# Patient Record
Sex: Female | Born: 1935
Health system: Southern US, Community
[De-identification: ages and names within clinical notes are randomized; demographics above are authoritative.]

## PROBLEM LIST (undated history)

## (undated) DIAGNOSIS — J449 Chronic obstructive pulmonary disease, unspecified: Secondary | ICD-10-CM

## (undated) DIAGNOSIS — N183 Chronic kidney disease, stage 3 unspecified: Secondary | ICD-10-CM

## (undated) DIAGNOSIS — I499 Cardiac arrhythmia, unspecified: Secondary | ICD-10-CM

## (undated) DIAGNOSIS — M4800 Spinal stenosis, site unspecified: Secondary | ICD-10-CM

## (undated) DIAGNOSIS — C439 Malignant melanoma of skin, unspecified: Secondary | ICD-10-CM

## (undated) DIAGNOSIS — I739 Peripheral vascular disease, unspecified: Secondary | ICD-10-CM

## (undated) DIAGNOSIS — I2510 Atherosclerotic heart disease of native coronary artery without angina pectoris: Secondary | ICD-10-CM

## (undated) DIAGNOSIS — E785 Hyperlipidemia, unspecified: Secondary | ICD-10-CM

## (undated) DIAGNOSIS — I10 Essential (primary) hypertension: Secondary | ICD-10-CM

## (undated) DIAGNOSIS — K5790 Diverticulosis of intestine, part unspecified, without perforation or abscess without bleeding: Secondary | ICD-10-CM

## (undated) DIAGNOSIS — I5032 Chronic diastolic (congestive) heart failure: Secondary | ICD-10-CM

## (undated) HISTORY — DX: Malignant melanoma of skin, unspecified: C43.9

## (undated) HISTORY — PX: HIP FRACTURE SURGERY: SHX118

## (undated) HISTORY — PX: OTHER SURGICAL HISTORY: SHX169

## (undated) HISTORY — DX: Spinal stenosis, site unspecified: M48.00

## (undated) HISTORY — DX: Peripheral vascular disease, unspecified: I73.9

## (undated) HISTORY — PX: CARDIAC CATHETERIZATION: SHX172

## (undated) HISTORY — PX: LUMBAR DISC SURGERY: SHX700

## (undated) HISTORY — PX: CATARACT EXTRACTION: SUR2

## (undated) HISTORY — DX: Hyperlipidemia, unspecified: E78.5

## (undated) HISTORY — DX: Essential (primary) hypertension: I10

## (undated) HISTORY — DX: Diverticulosis of intestine, part unspecified, without perforation or abscess without bleeding: K57.90

## (undated) HISTORY — DX: Cardiac arrhythmia, unspecified: I49.9

## (undated) HISTORY — DX: Atherosclerotic heart disease of native coronary artery without angina pectoris: I25.10

## (undated) HISTORY — DX: Chronic obstructive pulmonary disease, unspecified: J44.9

## (undated) HISTORY — DX: Chronic diastolic (congestive) heart failure: I50.32

## (undated) SURGERY — Surgical Case
Anesthesia: *Unknown

---

## 2005-02-22 ENCOUNTER — Ambulatory Visit: Payer: Self-pay

## 2005-05-24 ENCOUNTER — Encounter: Admission: RE | Admit: 2005-05-24 | Discharge: 2005-05-24 | Payer: Self-pay | Admitting: Cardiovascular Disease

## 2005-05-30 ENCOUNTER — Ambulatory Visit (HOSPITAL_COMMUNITY): Admission: RE | Admit: 2005-05-30 | Discharge: 2005-05-30 | Payer: Self-pay | Admitting: Cardiovascular Disease

## 2005-05-30 ENCOUNTER — Encounter: Payer: Self-pay | Admitting: Cardiovascular Disease

## 2005-12-18 ENCOUNTER — Other Ambulatory Visit: Payer: Self-pay

## 2005-12-19 ENCOUNTER — Inpatient Hospital Stay: Payer: Self-pay | Admitting: Internal Medicine

## 2006-08-12 ENCOUNTER — Ambulatory Visit: Payer: Self-pay | Admitting: Gastroenterology

## 2006-12-16 DIAGNOSIS — I2510 Atherosclerotic heart disease of native coronary artery without angina pectoris: Secondary | ICD-10-CM

## 2006-12-16 HISTORY — PX: CORONARY ARTERY BYPASS GRAFT: SHX141

## 2006-12-16 HISTORY — DX: Atherosclerotic heart disease of native coronary artery without angina pectoris: I25.10

## 2007-01-12 ENCOUNTER — Emergency Department: Payer: Self-pay | Admitting: Internal Medicine

## 2007-07-10 ENCOUNTER — Ambulatory Visit: Payer: Self-pay | Admitting: Family Medicine

## 2007-07-14 ENCOUNTER — Ambulatory Visit: Payer: Self-pay

## 2007-08-24 ENCOUNTER — Inpatient Hospital Stay: Payer: Self-pay | Admitting: Cardiovascular Disease

## 2007-08-25 ENCOUNTER — Encounter: Payer: Self-pay | Admitting: Cardiovascular Disease

## 2007-08-25 ENCOUNTER — Inpatient Hospital Stay (HOSPITAL_COMMUNITY): Admission: AD | Admit: 2007-08-25 | Discharge: 2007-09-04 | Payer: Self-pay | Admitting: Cardiovascular Disease

## 2007-08-26 ENCOUNTER — Encounter: Payer: Self-pay | Admitting: Surgery

## 2007-08-26 ENCOUNTER — Ambulatory Visit: Payer: Self-pay | Admitting: Thoracic Surgery (Cardiothoracic Vascular Surgery)

## 2007-09-21 ENCOUNTER — Encounter
Admission: RE | Admit: 2007-09-21 | Discharge: 2007-09-21 | Payer: Self-pay | Admitting: Thoracic Surgery (Cardiothoracic Vascular Surgery)

## 2007-10-05 ENCOUNTER — Encounter
Admission: RE | Admit: 2007-10-05 | Discharge: 2007-10-05 | Payer: Self-pay | Admitting: Thoracic Surgery (Cardiothoracic Vascular Surgery)

## 2007-10-05 ENCOUNTER — Ambulatory Visit: Payer: Self-pay | Admitting: Thoracic Surgery (Cardiothoracic Vascular Surgery)

## 2007-10-21 ENCOUNTER — Encounter: Payer: Self-pay | Admitting: Cardiovascular Disease

## 2007-10-23 ENCOUNTER — Encounter: Admission: RE | Admit: 2007-10-23 | Discharge: 2007-10-23 | Payer: Self-pay | Admitting: Cardiothoracic Surgery

## 2007-10-23 ENCOUNTER — Ambulatory Visit: Payer: Self-pay | Admitting: Cardiothoracic Surgery

## 2007-10-30 ENCOUNTER — Ambulatory Visit: Payer: Self-pay | Admitting: Cardiothoracic Surgery

## 2007-11-16 ENCOUNTER — Encounter: Payer: Self-pay | Admitting: Cardiovascular Disease

## 2007-12-17 ENCOUNTER — Encounter: Payer: Self-pay | Admitting: Cardiovascular Disease

## 2008-01-17 ENCOUNTER — Encounter: Payer: Self-pay | Admitting: Cardiovascular Disease

## 2008-09-23 ENCOUNTER — Encounter: Payer: Self-pay | Admitting: Cardiovascular Disease

## 2008-11-04 IMAGING — CR DG CHEST 2V
1 series · 2 of 2 positions shown · non-contrast
Comparison: none

REASON FOR EXAM: Chest pain
COMMENTS:

[Series 1: view not recorded · 0.17mm/px · 2 of 2 slices shown]
[im 1/2]
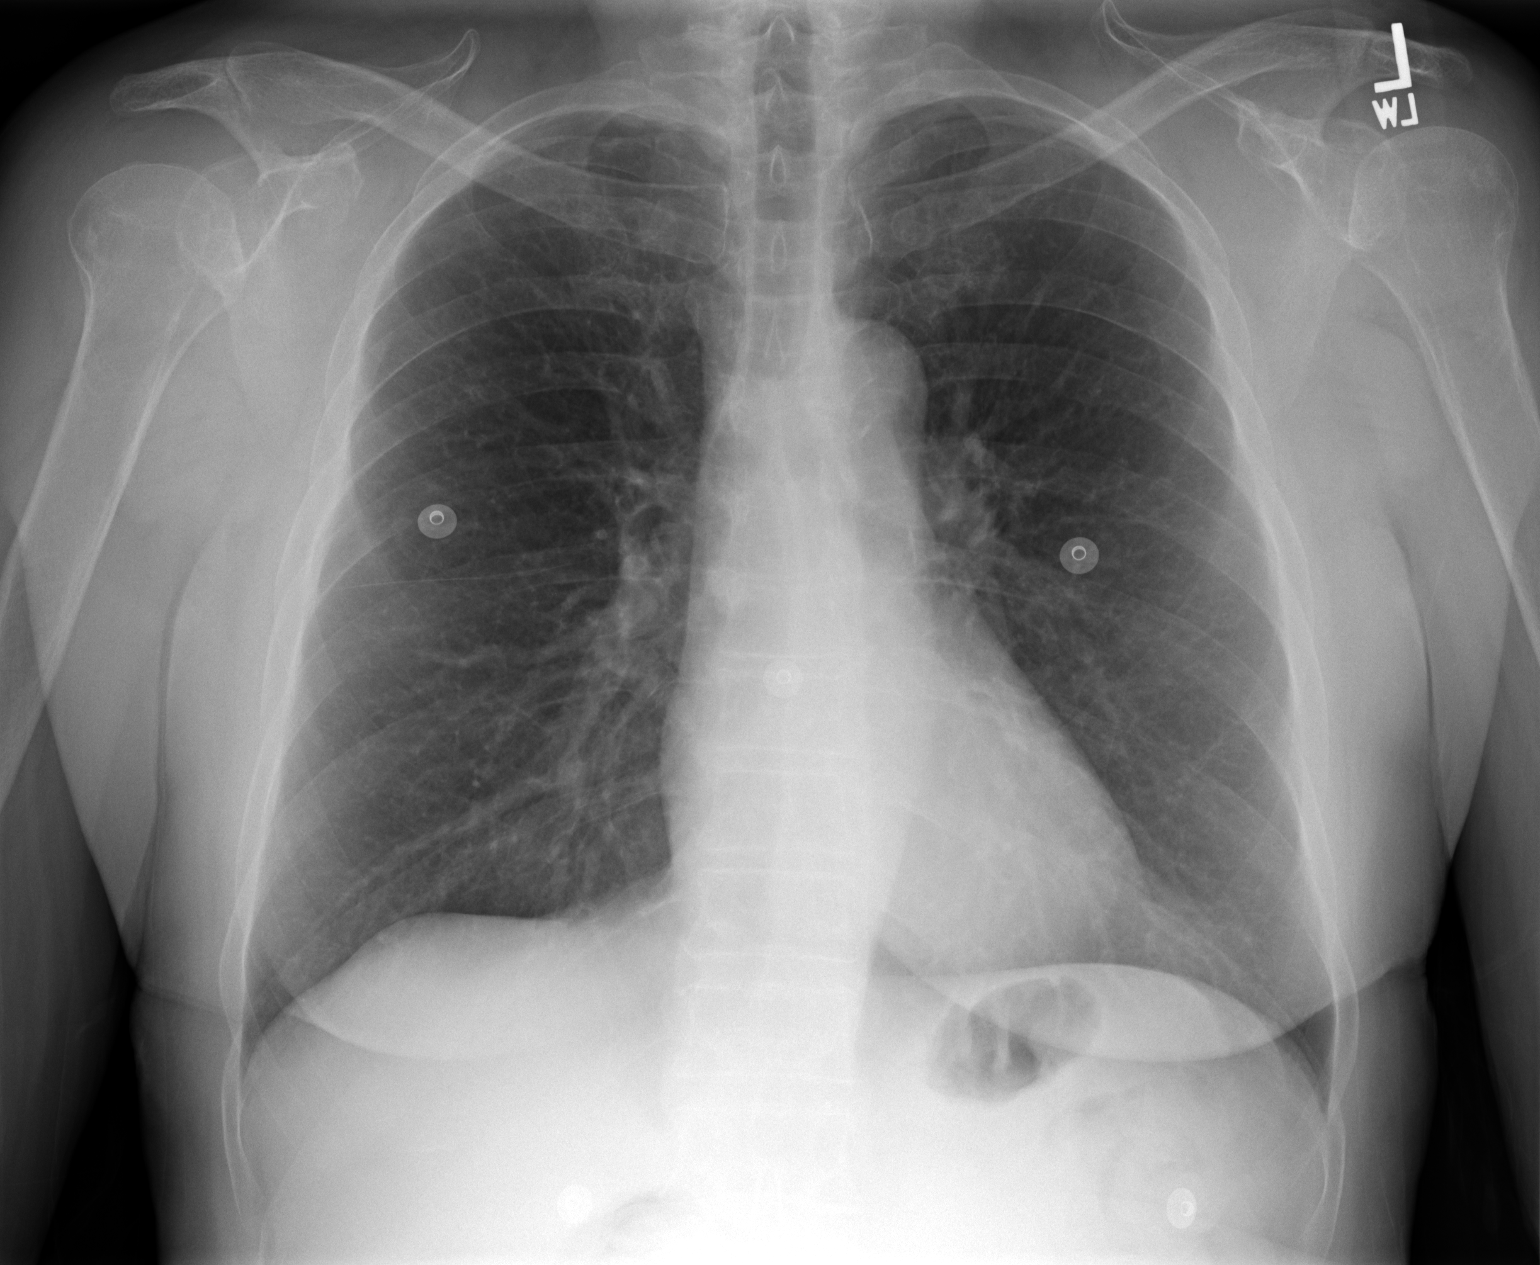
[im 2/2]
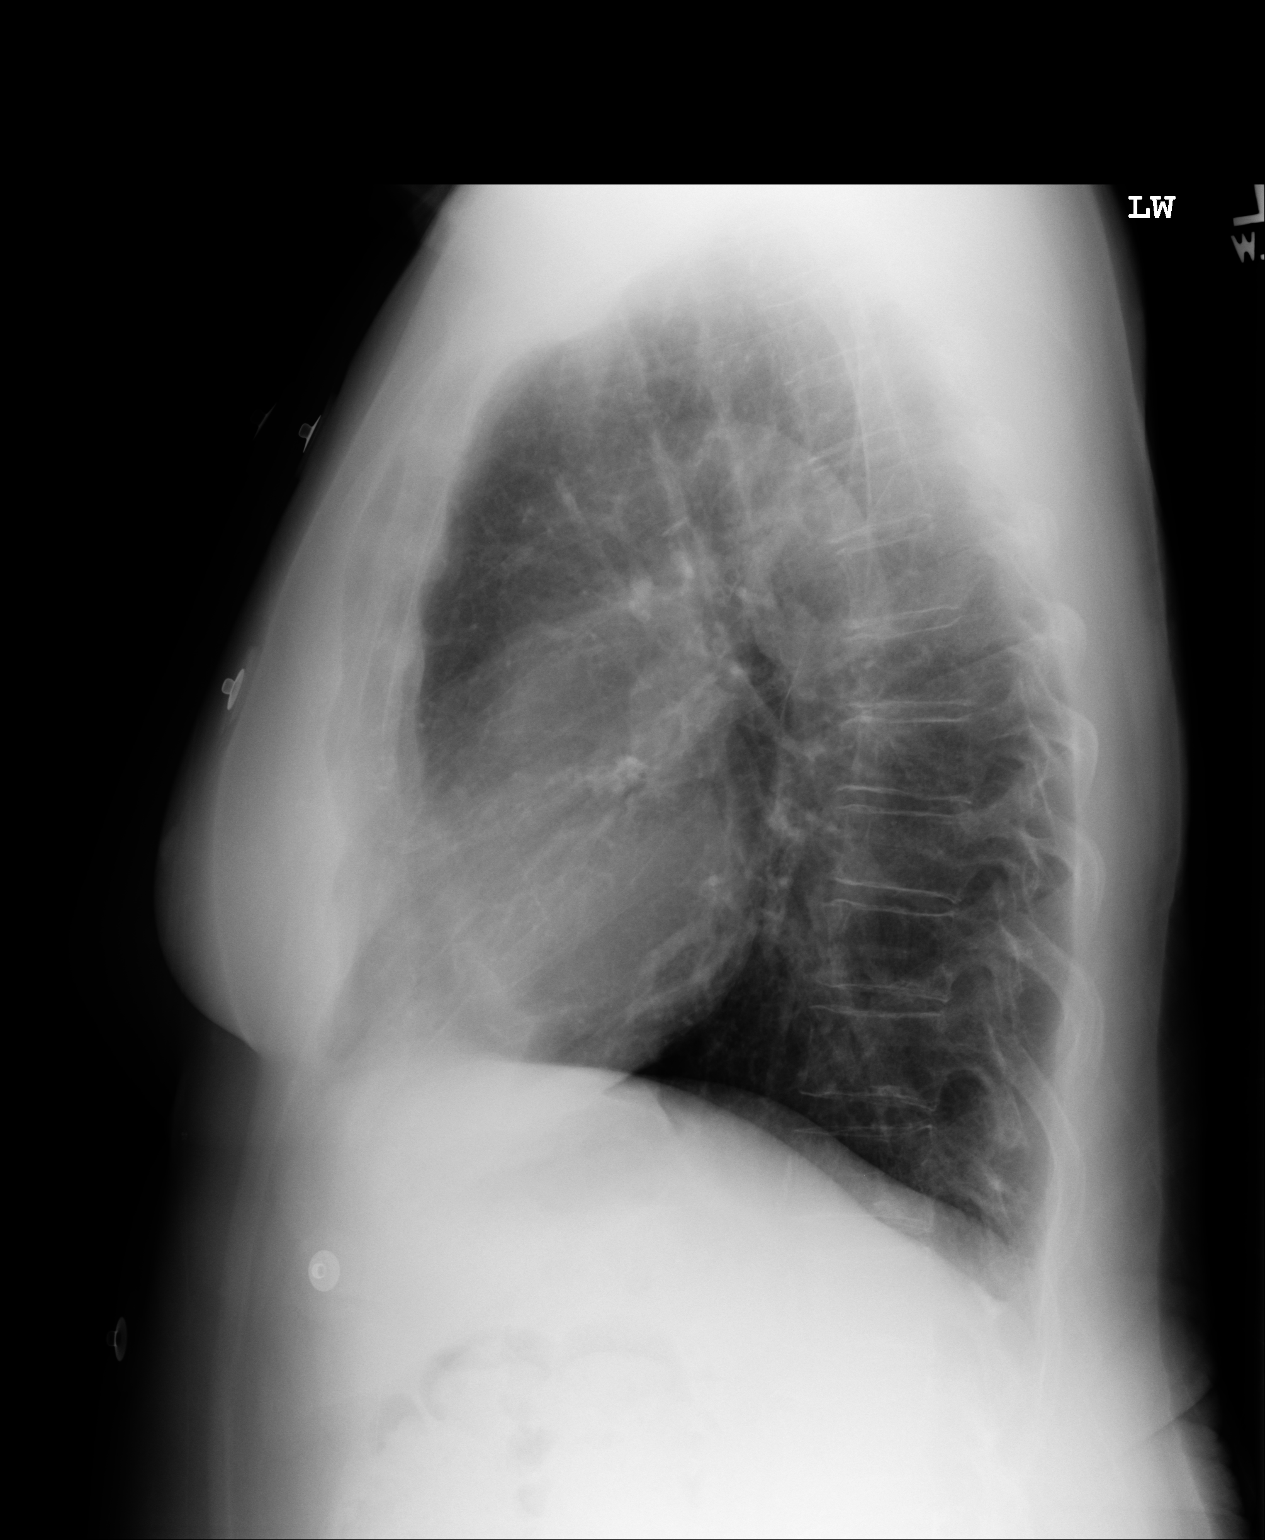

[2 of 2 positions shown; findings below may reference images not displayed]

PROCEDURE:     DXR - DXR CHEST PA (OR AP) AND LATERAL  - August 24, 2007  [DATE]

RESULT:     The lungs are mildly hyperinflated. There is no focal
infiltrate. The heart is normal in size and the pulmonary vascularity is not
engorged. The bones are mildly osteopenic. When compared to the study of
01/12/2007, there has not been significant interval change.
IMPRESSION: I do not see evidence of acute cardiopulmonary abnormality.
There are findings consistent with COPD.

## 2009-03-02 ENCOUNTER — Encounter: Payer: Self-pay | Admitting: Cardiovascular Disease

## 2009-05-08 ENCOUNTER — Encounter: Payer: Self-pay | Admitting: Cardiovascular Disease

## 2009-06-08 ENCOUNTER — Encounter: Payer: Self-pay | Admitting: Cardiovascular Disease

## 2009-06-14 ENCOUNTER — Encounter: Payer: Self-pay | Admitting: Cardiovascular Disease

## 2010-02-23 ENCOUNTER — Telehealth: Payer: Self-pay | Admitting: Cardiovascular Disease

## 2010-03-14 ENCOUNTER — Ambulatory Visit: Payer: Self-pay | Admitting: Cardiovascular Disease

## 2010-03-14 DIAGNOSIS — I25810 Atherosclerosis of coronary artery bypass graft(s) without angina pectoris: Secondary | ICD-10-CM | POA: Insufficient documentation

## 2010-03-14 DIAGNOSIS — E1165 Type 2 diabetes mellitus with hyperglycemia: Secondary | ICD-10-CM

## 2010-03-14 DIAGNOSIS — I739 Peripheral vascular disease, unspecified: Secondary | ICD-10-CM

## 2010-03-14 DIAGNOSIS — E785 Hyperlipidemia, unspecified: Secondary | ICD-10-CM | POA: Insufficient documentation

## 2010-03-14 DIAGNOSIS — I10 Essential (primary) hypertension: Secondary | ICD-10-CM

## 2010-03-14 DIAGNOSIS — R079 Chest pain, unspecified: Secondary | ICD-10-CM

## 2010-03-14 DIAGNOSIS — E118 Type 2 diabetes mellitus with unspecified complications: Secondary | ICD-10-CM

## 2010-03-27 ENCOUNTER — Telehealth: Payer: Self-pay | Admitting: Cardiovascular Disease

## 2010-04-03 ENCOUNTER — Telehealth: Payer: Self-pay | Admitting: Cardiovascular Disease

## 2010-04-20 ENCOUNTER — Telehealth: Payer: Self-pay | Admitting: Cardiovascular Disease

## 2010-06-06 ENCOUNTER — Telehealth: Payer: Self-pay | Admitting: Cardiovascular Disease

## 2010-06-07 ENCOUNTER — Encounter: Payer: Self-pay | Admitting: Cardiovascular Disease

## 2010-06-10 ENCOUNTER — Emergency Department: Payer: PRIVATE HEALTH INSURANCE | Admitting: Emergency Medicine

## 2010-06-12 ENCOUNTER — Telehealth: Payer: Self-pay | Admitting: Cardiovascular Disease

## 2010-06-19 ENCOUNTER — Encounter: Payer: Self-pay | Admitting: Cardiovascular Disease

## 2010-06-25 ENCOUNTER — Ambulatory Visit: Payer: Self-pay | Admitting: Cardiovascular Disease

## 2010-07-02 ENCOUNTER — Inpatient Hospital Stay: Payer: PRIVATE HEALTH INSURANCE | Admitting: *Deleted

## 2010-07-02 ENCOUNTER — Ambulatory Visit: Payer: Self-pay | Admitting: Cardiovascular Disease

## 2010-10-02 ENCOUNTER — Ambulatory Visit: Payer: Self-pay

## 2010-10-02 ENCOUNTER — Encounter: Payer: Self-pay | Admitting: Cardiovascular Disease

## 2010-11-06 ENCOUNTER — Telehealth: Payer: Self-pay | Admitting: Cardiovascular Disease

## 2010-11-20 ENCOUNTER — Telehealth: Payer: Self-pay | Admitting: Cardiovascular Disease

## 2011-01-06 ENCOUNTER — Encounter: Payer: Self-pay | Admitting: Cardiothoracic Surgery

## 2011-01-17 NOTE — Progress Notes (Signed)
Summary: PHI  PHI   Imported By: Harlon Flor 03/19/2010 09:08:55  _____________________________________________________________________  External Attachment:    Type:   Image     Comment:   External Document

## 2011-01-17 NOTE — Assessment & Plan Note (Signed)
Summary: ROV   Visit Type:  Follow-up Primary Provider:  Dr Vear Clock  CC:  c/o shortness of breath.  Had a spell of chest pain last week and did take a Nitro pill and symptoms were relieved.  She also has celulitis in left hand..  History of Present Illness: Ms. Donna Lane is a pleasant 77 year old woman with history of coronary artery disease, catheterization August 25, 2007 showing severe three-vessel disease, with bypass surgery at [facility] September 2008 also with severe peripheral vascular disease, bilateral renal artery stenosis, carotid disease, iliac disease who presents for follow up.   overall, miscarriage or is doing well. She does report having problems with her left hand from an infection and has had a recent course of antibiotics for cellulitis. She now has problems with her right wrist which is swollen and painful. She does report some discomfort up into her right shoulder. Previously she has had a cortisone shot to her right shoulder helped her discomfort. She has not been playing golf for several weeks due to the heat. She had one episode of chest discomfort several weeks ago that resolved after one nitroglycerin and has not had any further episodes. She believes it might have been due to her pain in her left hand.  lower extremity ultrasound June 2010 suggest equal or greater than 50% distal aorta disease, right common iliac artery with equal or less than 50% disease, left common iliac with equal or greater than 60% disease.  Renal artery ultrasound June 2010 showing greater than 60% right and left renal artery stenosis.  Carotid ultrasound June 2010 showed 50-60% stenosis of the right and left internal carotid artery.  EKG showing normal sinus rhythm with a rate of 63 beats minute, T-wave abnormality noted in leads V1, V2, borderline T-wave abnormality in aVL  Current Medications (verified): 1)  Aspirin Ec 325 Mg Tbec (Aspirin) .... Take 1/2 Tablet By Mouth  Daily 2)  Cilostazol 100 Mg Tabs (Cilostazol) .... Take 1/2 Tablet By Mouth Twice A Day 3)  Plavix 75 Mg Tabs (Clopidogrel Bisulfate) .... Take One Tablet By Mouth Daily 4)  Zetia 10 Mg Tabs (Ezetimibe) .... Take One Tablet By Mouth Daily. 5)  Metoprolol Tartrate 50 Mg Tabs (Metoprolol Tartrate) .... Take One Tablet By Mouth Twice A Day 6)  Simvastatin 20 Mg Tabs (Simvastatin) .... Take One Tablet By Mouth Daily At Bedtime 7)  Metformin Hcl 500 Mg Tabs (Metformin Hcl) .... Two Times A Day 8)  Hyzaar 100-12.5 Mg Tabs (Losartan Potassium-Hctz) .... 1/2 Once Daily 9)  Glipizide 5 Mg Tabs (Glipizide) .... Once Daily 10)  Calcium Carbonate-Vitamin D 600-400 Mg-Unit  Tabs (Calcium Carbonate-Vitamin D) .... Once Daily 11)  Miralax  Powd (Polyethylene Glycol 3350) .... Every 2 Days 12)  Phillips Colon Health  Caps (Probiotic Product) .... Every 2 Days 13)  Pletal 50 Mg Tabs (Cilostazol) .Marland Kitchen.. 1 Two Times A Day 14)  Isosorbide Mononitrate Cr 30 Mg Xr24h-Tab (Isosorbide Mononitrate) .Marland Kitchen.. 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  Alcohol Use - no Regular Exercise - no 2 children- healthy  Risk Factors: Alcohol Use: 0 (03/14/2010) Caffeine Use: 0 (03/14/2010) Exercise: no (03/14/2010)  Risk Factors: Smoking Status: quit (03/14/2010) Packs/Day:  1.0 (03/14/2010)  Review of Systems  The patient denies fever, weight loss, weight gain, vision loss, decreased hearing, hoarseness, chest pain, syncope, dyspnea on exertion, peripheral edema, prolonged cough, abdominal pain, incontinence, muscle weakness, depression, and enlarged lymph nodes.         right writs and shoulder pain  Vital Signs:  Patient profile:   77 year old female Height:      63 inches Weight:      150 pounds Pulse rate:   64 / minute BP sitting:   137 / 74  (left arm) Cuff size:   regular  Vitals Entered By: Bishop Dublin, CMA (June 25, 2010 10:08 AM)  Physical Exam  General:  Well developed, well nourished, in no acute distress. Head:  normocephalic and atraumatic Neck:  Neck supple, no JVD. No masses, thyromegaly or abnormal cervical nodes. Lungs:  Clear bilaterally to auscultation and percussion. Heart:  Non-displaced PMI, chest non-tender; regular rate and rhythm, S1, S2 with II SEM RSB , no rubs or gallops. Carotid upstroke normal, no bruit. Pedals normal pulses. No edema, no varicosities. Abdomen:  Bowel sounds positive; abdomen soft and non-tender without masses Msk:  Back normal, normal gait. Muscle strength and tone normal. Pulses:  diminished pulses in her lower extremities bilaterally Extremities:  No clubbing or cyanosis. Neurologic:  Alert and oriented x 3.   Impression & Recommendations:  Problem # 1:  CORONARY ATHEROSLERO AUTOL VEIN BYPASS GRAFT (ICD-414.02) history of bypass surgery for severe three-vessel coronary artery disease. One episode of chest pain that she's not had any further episodes and she feels it was not her heart. Have asked her to let us know she has further episodes of discomfort where she has to take nitroglycerin. She is on aspirin and a nonsmoker.  The following medications were removed from the medication list:    Pletal 100 Mg Tabs (Cilostazol) .Marland Kitchen... 1/2 tab daily Her updated medication list for this problem  includes:    Aspirin Ec 325 Mg Tbec (Aspirin) .Marland Kitchen... Take 1/2 tablet by mouth daily    Cilostazol 100 Mg Tabs (Cilostazol) .Marland Kitchen... Take 1/2 tablet by mouth twice a day    Plavix 75 Mg Tabs (Clopidogrel bisulfate) .Marland Kitchen... Take one tablet by mouth daily    Metoprolol Tartrate 50 Mg Tabs (Metoprolol tartrate) .Marland Kitchen... Take one tablet by mouth twice a day    Pletal 50 Mg Tabs (Cilostazol) .Marland Kitchen... 1 two times a day    Isosorbide Mononitrate Cr 30 Mg Xr24h-tab (Isosorbide mononitrate) .Marland Kitchen... 1 once daily    Nitrostat 0.4 Mg Subl (Nitroglycerin) .Marland Kitchen... 1 tablet under tongue at onset of chest pain; you may repeat every 5 minutes for up to 3 doses.  Problem # 2:  HYPERLIPIDEMIA (ICD-272.4) We will try to obtain her most recent lipid panel from Dr. Vear Clock. Goal LDL is less than 70  Her updated medication list for this problem includes:    Zetia 10 Mg Tabs (Ezetimibe) .Marland Kitchen... Take one tablet by mouth daily.    Simvastatin 20 Mg Tabs (Simvastatin) .Marland Kitchen... Take one tablet by mouth daily at bedtime  Problem # 3:  PVD (ICD-443.9) She has some  disease of her carotids, iliacs, renals. We will set her up for her annual ultrasound of her disease in the next several months.  Other Orders: EKG w/ Interpretation (93000) Arterial Duplex Lower Extremity (Arterial Duplex Low) Carotid Duplex (Carotid Duplex) Renal Artery Duplex (Renal Artery Duplex)  Patient Instructions: 1)  Your physician wants you to follow-up in:   6 month You will receive a reminder letter in the mail two months in advance. If you don't receive a letter, please call our office to schedule the follow-up appointment. 2)  Your physician has requested that you have a carotid duplex. This test is an ultrasound of the carotid arteries in your neck. It looks at blood flow through these arteries that supply the brain with blood. Allow one hour for this exam. There are no restrictions or special instructions. 3)  Your physician has requested that you have a lower  extremity arterial duplex.  This test is an ultrasound of the arteries in the legs or arms.  It looks at arterial blood flow in the legs and arms.  Allow one hour for Lower and Upper Arterial scans. There are no restrictions or special instructions. 4)  Your physician has requested that you have a renal artery duplex. During this test, an ultrasound is used to evaluate blood flow to the kidneys. Allow one hour for this exam. Do not eat after midnight the day before and avoid carbonated beverages. Take your medications as you usually do. Prescriptions: NITROSTAT 0.4 MG SUBL (NITROGLYCERIN) 1 tablet under tongue at onset of chest pain; you may repeat every 5 minutes for up to 3 doses.  #25 x 3   Entered by:   Benedict Needy, RN   Authorized by:   Dossie Arbour MD   Signed by:   Benedict Needy, RN on 06/25/2010   Method used:   Electronically to        [facility] [address])       [address]       [place], [place]  [address]       Ph: [phone]       Fax: [phone]   RxID:   [id number]

## 2011-01-17 NOTE — Progress Notes (Signed)
Summary: RX  Phone Note Refill Request Call back at Home Phone [phone] Message from:  Patient on March 27, 2010 9:51 AM  Refills Requested: Medication #1:  SIMVASTATIN 20 MG TABS Take one tablet by mouth daily at bedtime [facility] [address]  Initial call taken by: Harlon Flor,  March 27, 2010 9:51 AM    Prescriptions: SIMVASTATIN 20 MG TABS (SIMVASTATIN) Take one tablet by mouth daily at bedtime  #30 x 6   Entered by:   Mercer Pod   Authorized by:   Dossie Arbour MD   Signed by:   Mercer Pod on 03/27/2010   Method used:   Electronically to        [facility] [address] (retail)       [address], [address]       [place], [place]  [address]       Ph: [phone]       Fax: [phone]   RxID:   [id number]

## 2011-01-17 NOTE — Progress Notes (Signed)
Summary: RX Pletal  Phone Note Refill Request Call back at Home Phone [phone] Message from:  Patient on June 06, 2010 2:12 PM  Refills Requested: Medication #1:  CILOSTAZOL 100 MG TABS Take 1/2 tablet by mouth twice a day WAS TOLD AT [facility] [address] [place] THAT THIS MEDICATION IS ON BACK ORDER-PT WANTS TO KNOW IF DR Mariah Milling IS AWARE OF THIS-PLEASE CALL PT  Initial call taken by: Harlon Flor,  June 06, 2010 2:12 PM

## 2011-01-17 NOTE — Cardiovascular Report (Signed)
Summary: Cardiac Cath Other  Cardiac Cath Other   Imported By: Harlon Flor 03/19/2010 09:01:59  _____________________________________________________________________  External Attachment:    Type:   Image     Comment:   External Document

## 2011-01-17 NOTE — Progress Notes (Signed)
Summary: Pain  Phone Note Call from Patient Call back at Home Phone [phone]   Caller: self Call For: gollan Summary of Call: Pt is having pain across shoulder blades and chest-Feels like indigestion-Has taken Tums and used Nitroglycerin-Has been going on for 2 weeks Initial call taken by: Harlon Flor,  November 06, 2010 3:03 PM  Follow-up for Phone Call        Attempted to contact pt, pt is not home she has gone to get her hair done.  Pt's husband will have her call us back when she returns.  Follow-up by: Cloyde Reams RN,  November 06, 2010 3:18 PM  Additional Follow-up for Phone Call Additional follow up Details #1::        Called spoke with pt, pt states she has had a dull ache in the "pit of her stomach", center of chest below breasts not relieved by Tums or NTG x 2 weeks.  Pain can last for upto 1 hr, then dissipates. Pain also across shoulder blades in back, but pt recently had a melanoma removed from her back in that same location right above her shoulder blade. No nausea or vomiting, pt does sweat, but that is not new for pt. Does not seem to worsen with activity or get better with rest.  Pt states she played 9 holes of golf yesterday and pain did not worsen during. Please advise.  Thanks Additional Follow-up by: Cloyde Reams RN,  November 06, 2010 4:23 PM    Additional Follow-up for Phone Call Additional follow up Details #2::    Discussed with Dr Mariah Milling recommended pt trying  a stronger PPI such as OTC Prilosec 20mg  once daily.  Try IBU or Tylenol as needed for pain ? muscular skeletal pain in shoulders, and or pain related to recent removal of melanoma.  Continue on Imdur daily and prn NTG, continue to monitor symptoms.  Call if pain persists or worsens.  Go to ED if acute pain develops. Follow-up by: Cloyde Reams RN,  November 06, 2010 5:12 PM

## 2011-01-17 NOTE — Progress Notes (Signed)
Summary: RX  Phone Note Refill Request Call back at Home Phone [phone] Message from:  Patient on February 23, 2010 1:35 PM  MEDICATION HAS NOT BEEN CALLED IN FOR A MEDICATION THAT STARTS WITH AN "I"  Initial call taken by: Harlon Flor,  February 23, 2010 1:36 PM  Follow-up for Phone Call        rx called in will call pharmacy to see what is going on.  Cala Bradford :) pt aware  Follow-up by: Mercer Pod,  February 23, 2010 2:43 PM

## 2011-01-17 NOTE — Progress Notes (Signed)
Summary: confusion about medications  Phone Note Outgoing Call   Summary of Call: called pt to f/u on numerous requests for simva refills.  pt states that she has instructions from her hospital dc 3 years ago that says for her to take simva 20 two times a day.  based on call to pharmacy, pt cannot get refills because it is too early.  this was explained to pt and she was instructed to disregard papers stating to take simva two times a day. pt understands and will refill med on 5/5 when it is eligible to be refilled. will only take med once a day. Charlena Cross RN BSN

## 2011-01-17 NOTE — Progress Notes (Signed)
----   Converted from flag ---- ---- 06/12/2010 9:32 AM, Bishop Dublin, CMA wrote: What do you recommend she changed to?  Pletal is on back order.  Thanks Bishop Dublin ------------------------------   Stay on ASA and plavix. No substitute for pletal. Can pick up when it comes in from back order  Appended Document:  Patient went to [facility] and they had Pletal.

## 2011-01-17 NOTE — Progress Notes (Signed)
Summary: RX refill Zetia  Phone Note Refill Request Call back at Home Phone [phone] Message from:  SELF on Apr 20, 2010 1:50 PM  Refills Requested: Medication #1:  ZETIA 10 MG TABS Take one tablet by mouth daily. [facility] [address] TOLD PT THAT SHE NEEDED TO CALL HERE TO FIND OUT WHY THIS HAS NOT BEEN REFILLED  Initial call taken by: Harlon Flor,  Apr 20, 2010 1:51 PM    Prescriptions: ZETIA 10 MG TABS (EZETIMIBE) Take one tablet by mouth daily.  #30 x 9   Entered by:   Stanton Kidney, EMT-P   Authorized by:   Dossie Arbour MD   Signed by:   Stanton Kidney, EMT-P on 04/20/2010   Method used:   Electronically to        [facility] [address] (retail)       [address]       [place], [place]  [address]       Ph: [phone]       Fax: [phone]   RxID:   [id number]

## 2011-01-17 NOTE — Cardiovascular Report (Signed)
Summary: Cardiac Cath Other  Cardiac Cath Other   Imported By: Harlon Flor 03/19/2010 09:01:20  _____________________________________________________________________  External Attachment:    Type:   Image     Comment:   External Document

## 2011-01-17 NOTE — Assessment & Plan Note (Signed)
Summary: NP6/AMD   Visit Type:  Follow-up Primary Provider:  Dr Vear Clock  CC:  pressure in chest at sternum, LE edema, SOB with exertion, dizzy and lightheaded- all the time, and beautician had to "catch" her this morning; feels like heart flutters at time.  History of Present Illness: Donna Lane is a pleasant 77 year old woman with history of coronary artery disease, catheterization August 25, 2007 at home as showing severe three-vessel disease, with bypass surgery at [facility] September 2008 also with severe peripheral vascular disease, bilateral renal artery stenosis, carotid disease, iliac disease who presents to reestablish care. She was last seen at [facility] by myself in June 2010.  Overall Donna Lane states that she has been doing well. She is active but states that if she is very active, she feels a tightness and fullness in her chest. It goes away if she stops rushing around. She has had this for several weeks time. She does not feel that it is the same as her previous anginal equivalent which was more of a pain with some sweating. She is still able to go play golf, go do her shopping and do her ADLs. The symptoms come on only if she really rushes. She is uncertain if it is from her breathing. She has not been taking her diuretic pill as she should and takes one in the morning and typically is supposed to take a half and he evening but has not been doing this.  lower extremity ultrasound June 2010 suggest equal or greater than 50% distal aorta disease, right common iliac artery with equal or less than 50% disease, left common iliac with equal or greater than 60% disease.  Renal artery ultrasound June 2010 showing greater than 60% right and left renal artery stenosis.  Carotid ultrasound June 2010 showed 50-60% stenosis of the right and left internal carotid artery.  Preventive Screening-Counseling & Management  Alcohol-Tobacco     Alcohol  drinks/day: 0     Smoking Status: quit     Packs/Day: 1.0     Year Started: 1952     Year Quit: 2008  Caffeine-Diet-Exercise     Caffeine use/day: 0     Does Patient Exercise: no  Current Problems (verified): 1)  Dm  (ICD-250.00) 2)  Hyperlipidemia  (ICD-272.4) 3)  Hypertension  (ICD-401.9) 4)  Pvd  (ICD-443.9) 5)  Coronary Atheroslero Autol Vein Bypass Graft  (ICD-414.02)  Current Medications (verified): 1)  Aspirin Ec 325 Mg Tbec (Aspirin) .... Take 1/2 Tablet By Mouth Daily 2)  Cilostazol 100 Mg Tabs (Cilostazol) .... Take 1/2 Tablet By Mouth Twice A Day 3)  Plavix 75 Mg Tabs (Clopidogrel Bisulfate) .... Take One Tablet By Mouth Daily 4)  Zetia 10 Mg Tabs (Ezetimibe) .... Take One Tablet By Mouth Daily. 5)  Metoprolol Tartrate 50 Mg Tabs (Metoprolol Tartrate) .... Take One Tablet By Mouth Twice A Day 6)  Simvastatin 20 Mg Tabs (Simvastatin) .... Take One Tablet By Mouth Daily At Bedtime 7)  Furosemide 20 Mg Tabs (Furosemide) .... Take One Tablet By Mouth Daily 8)  Metformin Hcl 500 Mg Tabs (Metformin Hcl) .... Two Times A Day 9)  Hyzaar 100-12.5 Mg Tabs (Losartan Potassium-Hctz) .... 1/2 Once Daily 10)  Glipizide 5 Mg Tabs (Glipizide) .... Once Daily 11)  Calcium Carbonate-Vitamin D 600-400 Mg-Unit  Tabs (Calcium Carbonate-Vitamin D) .... Once Daily 12)  Pletal 100 Mg Tabs (Cilostazol) .... 1/2 Tab Daily 13)  Miralax  Powd (Polyethylene Glycol 3350) .Marland KitchenMarland KitchenMarland Kitchen  Every 2 Days 14)  Phillips Colon Health  Caps (Probiotic Product) .... 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  Alcohol Use - no Regular Exercise - no 2 children- healthy Alcohol drinks/day:  0 Smoking Status:  quit Packs/Day:  1.0 Caffeine use/day:  0 Does Patient Exercise:  no  Review of Systems       The patient complains of dyspnea on exertion and peripheral edema.  The patient denies fever, weight loss, weight gain, vision loss, decreased hearing, hoarseness, chest pain, syncope, prolonged cough, abdominal pain, incontinence, muscle weakness, depression, and enlarged lymph nodes.         Mild chest fullness with heavy exertion   Vital Signs:  Patient profile:   77 year old female Height:      63 inches Weight:      160.75 pounds BMI:     28.58 Pulse rate:   66 / minute Pulse rhythm:   regular BP sitting:   128 / 60  (left arm) Cuff size:   regular  Vitals Entered By: Charlena Cross, RN, BSN (March 14, 2010 3:26 PM)   Impression & Recommendations:  Problem # 1:  CHEST PAIN-UNSPECIFIED (ICD-786.50) the etiology of her chest pain is uncertain. It does come on with heavy exertion when she is rushing. She does have a long smoking history concerning for her COPD and she is currently not taking any inhalers. She is also not taking her fluid pill and off and she does have some scant rales on exam. It also could be angina.  We have suggested to her that she try taking it next her fluid pill in the afternoon. She does have a little bit of fluid in the ankles and possibly some on auscultation of her lungs.  Past her to also talk with Dr. Vear Clock about whether she might possibly need an inhaler p.r.n. for this chest tightness, such as albuterol.  If she continues to have chest pain/tightness with exertion, we will have her complete a stress test at [facility]. She does have significant coronary artery disease and a history  of bypass grafts. She does not seem particularly worried about it at this time to have asked her not to wait until it gets worse and to contact our office.  Her updated medication list for this problem includes:    Aspirin Ec 325 Mg Tbec (Aspirin) .Marland Kitchen... Take 1/2 tablet by mouth daily    Cilostazol 100 Mg Tabs (Cilostazol) .Marland Kitchen... Take 1/2 tablet by mouth twice a day    Plavix 75 Mg Tabs (Clopidogrel bisulfate) .Marland Kitchen... Take one tablet by mouth daily    Metoprolol Tartrate 50 Mg Tabs (Metoprolol tartrate) .Marland Kitchen... Take one tablet by mouth twice a day    Pletal 100 Mg Tabs (Cilostazol) .Marland Kitchen... 1/2 tab daily  Problem # 2:  HYPERLIPIDEMIA (ICD-272.4) Her cholesterol has typically been well controlled on simvastatin and zetia. We'll try  to get a copy of her most recent labs from Dr. Vear Clock.  Her updated medication list for this problem includes:    Zetia 10 Mg Tabs (Ezetimibe) .Marland Kitchen... Take one tablet by mouth daily.    Simvastatin 20 Mg Tabs (Simvastatin) .Marland Kitchen... Take one tablet by mouth daily at bedtime  Problem # 3:  PVD (ICD-443.9) She does have a history of severe peripheral vascular disease this has been relatively stable. We have suggested that she have a repeat carotid, renal and lower extremity Doppler at the end of the summer. She has no significant claudication type symptoms with playing golf.  She is not a smoker and stopped 3 years ago, she takes an aspirin and Plavix daily.  Problem # 4:  DM (ICD-250.00) I've asked her to closely watch her diabetes as this is important given her underlying coronary and vascular disease.  Her updated medication list for this problem includes:    Aspirin Ec 325 Mg Tbec (Aspirin) .Marland Kitchen... Take 1/2 tablet by mouth daily    Metformin Hcl 500 Mg Tabs (Metformin hcl) .Marland Kitchen..Marland Kitchen Two times a day    Hyzaar 100-12.5 Mg Tabs (Losartan potassium-hctz) .Marland Kitchen... 1/2 once daily    Glipizide 5 Mg Tabs (Glipizide) ..... Once daily  Appended Document: NP6/AMD    Clinical Lists  Changes  Orders: Added new Test order of Carotid Duplex (Carotid Duplex) - Signed Added new Test order of Renal Artery Duplex (Renal Artery Duplex) - Signed Added new Test order of Arterial Duplex Lower Extremity (Arterial Duplex Low) - Signed

## 2011-01-17 NOTE — Progress Notes (Signed)
Summary: RX  Phone Note Call from Patient Call back at Home Phone [phone]   Caller: Self Call For: Gollan Summary of Call: Pt wants to discontinue Isosorbide b/c the pharmacy does not have the requested dosage.  Pt usually takes 30 mg, the pharmacy only has 60 mg.    Initial call taken by: Harlon Flor,  November 20, 2010 3:45 PM  Follow-up for Phone Call        Probably need to try a new pharmacy. Given the extent of her CAD and PVD, she would benefit greatly from imdur. Recommendation would be to continue. Her choice.      Appended Document: RX    Phone Note Call from Patient   Caller: Patient Summary of Call: Pt called back, we advised her that Dr. Mariah Milling wants her to stay on Imdur 30mg  once daily, she informed us [facility] only has 60mg . We spoke with [facility] pharmacy and confirmed that Imdur 30mg  is on back order.  Her insurance prefers [facility]. Is it ok to prescribe Imdur 60mg  1/2 tablet daily? Initial call taken by: Lanny Hurst RN,  November 21, 2010 10:21 AM  Follow-up for Phone Call        Should be ok for now if that is all we have      Appended Document: RX    Clinical Lists Changes  Medications: Changed medication from ISOSORBIDE MONONITRATE CR 30 MG XR24H-TAB (ISOSORBIDE MONONITRATE) 1 once daily to ISOSORBIDE MONONITRATE CR 60 MG XR24H-TAB (ISOSORBIDE MONONITRATE) Take 1/2  tablet by mouth daily - Signed Rx of ISOSORBIDE MONONITRATE CR 60 MG XR24H-TAB (ISOSORBIDE MONONITRATE) Take 1/2  tablet by mouth daily;  #30 x 6;  Signed;  Entered by: Lanny Hurst RN;  Authorized by: Dossie Arbour MD;  Method used: Electronically to [facility] [address], [place], [place], [place]  [address], Ph: [phone], Fax: [phone]    Prescriptions: ISOSORBIDE MONONITRATE CR 60 MG XR24H-TAB (ISOSORBIDE MONONITRATE) Take 1/2  tablet by mouth daily  #30 x 6   Entered by:   Lanny Hurst RN   Authorized by:   Dossie Arbour  MD   Signed by:   Lanny Hurst RN on 11/23/2010   Method used:   Electronically to        [facility]  [address] (retail)       [address], [address]       [place], [place]  [address]       Ph: [phone]       Fax: [phone]   RxID:   [id number]  Pt notified rx called in for Imdur mononitrate 60mg  1/2 tablet once daily since Imdur 30mg  is on back order. MES

## 2011-03-04 ENCOUNTER — Telehealth: Payer: Self-pay | Admitting: Cardiovascular Disease

## 2011-03-14 NOTE — Progress Notes (Signed)
Summary: CP and SOB  Phone Note Call from Patient Call back at [facility] Phone [phone]   Caller: Self Call For: Lexander Tremblay Summary of Call: Pt c/o hurting in her chest and SOB.  Pt took 14 days worth of medication for acid reflux with no relief.  Pt took 2 nitroglycerin today and noticed relief. Initial call taken by: Harlon Flor,  March 04, 2011 3:22 PM  Follow-up for Phone Call        Talked with Dr. Mariah Milling about msg above. Spoke to pt, she states she took NTG within of each other. She denies any symptoms at this time. Pt states her BP this Am was 135/65. Pt currently takes Imdur CR 60mg  1/2 tablet once daily. Advised pt incr that to 1 tablet once daily and to monitor BP with the increase. Advised pt to monitor her symptoms and to call office if symptoms return, otherwise, have scheduled a f/u with Dr. Mariah Milling. Follow-up by: Lanny Hurst RN,  March 04, 2011 5:24 PM

## 2011-03-20 ENCOUNTER — Ambulatory Visit (INDEPENDENT_AMBULATORY_CARE_PROVIDER_SITE_OTHER): Payer: Medicare Other | Admitting: Cardiovascular Disease

## 2011-03-20 ENCOUNTER — Encounter: Payer: Self-pay | Admitting: Cardiovascular Disease

## 2011-03-20 DIAGNOSIS — I739 Peripheral vascular disease, unspecified: Secondary | ICD-10-CM

## 2011-03-20 DIAGNOSIS — I10 Essential (primary) hypertension: Secondary | ICD-10-CM

## 2011-03-20 DIAGNOSIS — I25810 Atherosclerosis of coronary artery bypass graft(s) without angina pectoris: Secondary | ICD-10-CM

## 2011-03-20 DIAGNOSIS — R079 Chest pain, unspecified: Secondary | ICD-10-CM

## 2011-03-20 DIAGNOSIS — E785 Hyperlipidemia, unspecified: Secondary | ICD-10-CM

## 2011-03-20 MED ORDER — ASPIRIN 81 MG PO TABS: 81.0000 mg | ORAL_TABLET | Freq: Every day | ORAL | Status: DC

## 2011-03-20 NOTE — Assessment & Plan Note (Signed)
We will watch her closely. She does have a history of severe coronary artery disease and history of bypass. Continue current medication regimen for now with no testing. Her last she contact me in the next week or so to detail her symptoms.

## 2011-03-20 NOTE — Assessment & Plan Note (Signed)
She does have carotid disease, aortic disease and lower extremity arterial disease. Repeat ultrasounds will need to be ordered later this year.

## 2011-03-20 NOTE — Patient Instructions (Signed)
Please  Call the office if your chest pain gets worse. Try acid blocker medication (try two a day). Decrease the aspirin to 81 mg coated a day. Follow up in clinic in 6 months or earlier if chest pain does not resolve.

## 2011-03-20 NOTE — Assessment & Plan Note (Signed)
Etiology of her chest pain is likely noncardiac. There is no exertional component. Treat his anginal symptoms included diaphoresis. She does not have any of this symptom. It was mildly relieved with proton pump inhibitors. We have suggested she retry the proton pump inhibitors, stop playing golf until her symptoms have resolved. Take 81 mg aspirin is coated and not cur a large aspirin in half.

## 2011-03-20 NOTE — Assessment & Plan Note (Signed)
Blood pressure is well controlled on today's visit. No changes made to the medications. 

## 2011-03-20 NOTE — Assessment & Plan Note (Signed)
Continue current medication. Goal LDL less than 70.

## 2011-03-20 NOTE — Progress Notes (Signed)
Patient ID: Donna Lane, female    DOB: [birth year], 77 y.o.   MRN: [id number]  HPI Comments: Donna Lane is a pleasant 77 year old woman with history of coronary artery disease, catheterization August 25, 2007 showing severe three-vessel disease, with bypass surgery at [facility] September 2008 also with severe peripheral vascular disease, bilateral renal artery stenosis, carotid disease, iliac disease who presents for Office visit with new symptoms of chest pain.  She reports that over the past month or 2 she has had lower mediastinal chest pain. It is a dull, tight ache that is there in the daytime and nighttime. It waxes and wanes, comes on for long periods. Walking does not make it worse. She has been playing golf and wonders if this could have caused her symptoms in the first place. She is unable to walk very far secondary to shortness of breath which has been chronic. She does have a long smoking history but stopped several years ago. She does not use oxygen at home.  She is unable to reproduce this pain with movement or palpation. She has tried over-the-counter put on pump inhibitors with mild relief but ran out of the medication and recently bought more. She has been taking aspirin 325 mg cut in half. Once she took a nitroglycerin and the pain went away, a second time she took 2 nitroglycerin and the pain did not resolve.  lower extremity ultrasound June 2010 suggest equal or greater than 50% distal aorta disease, right common iliac artery with equal or less than 50% disease, left common iliac with equal or greater than 60% disease.  Renal artery ultrasound June 2010 showing greater than 60% right and left renal artery stenosis.  Carotid ultrasound June 2010 showed 50-60% stenosis of the right and left internal carotid artery.  EKG showing normal sinus rhythm with a rate of 62 beats minute, T-wave abnormality noted in leads V1, V2, V3,  borderline T-wave abnormality in  aVL     Review of Systems  Constitutional: Negative.   HENT: Negative.   Eyes: Negative.   Respiratory: Positive for shortness of breath.   Cardiovascular: Positive for chest pain.  Gastrointestinal: Negative.   Musculoskeletal: Negative.   Skin: Negative.   Neurological: Negative.   Hematological: Negative.   Psychiatric/Behavioral: Negative.   All other systems reviewed and are negative.   BP 120/60  Pulse 61  Ht 5\' 3"  (1.6 m)  Wt 147 lb (66.679 kg)  BMI 26.04 kg/m2   Physical Exam  Nursing note and vitals reviewed. Constitutional: She is oriented to person, place, and time. She appears well-developed and well-nourished.  HENT:  Head: Normocephalic.  Nose: Nose normal.  Mouth/Throat: Oropharynx is clear and moist.  Eyes: Conjunctivae are normal. Pupils are equal, round, and reactive to light.  Neck: Normal range of motion. Neck supple. No JVD present.  Cardiovascular: Normal rate, regular rhythm, S1 normal, S2 normal and intact distal pulses.  Exam reveals no gallop and no friction rub.   Murmur heard.  Crescendo systolic murmur is present with a grade of 2/6  Pulmonary/Chest: Effort normal. No respiratory distress. She has no wheezes. She has no rales. She exhibits no tenderness.       Moderately decreased breath sounds bilaterally throughout.  Abdominal: Soft. Bowel sounds are normal. She exhibits no distension. There is no tenderness.  Musculoskeletal: Normal range of motion. She exhibits no edema and no tenderness.  Lymphadenopathy:    She has no cervical adenopathy.  Neurological: She is  alert and oriented to person, place, and time. Coordination normal.  Skin: Skin is warm and dry. No rash noted. No erythema.  Psychiatric: She has a normal mood and affect. Her behavior is normal. Judgment and thought content normal.         Assessment and Plan

## 2011-04-24 ENCOUNTER — Other Ambulatory Visit: Payer: Self-pay | Admitting: Cardiovascular Disease

## 2011-04-24 ENCOUNTER — Other Ambulatory Visit: Payer: Self-pay

## 2011-04-24 MED ORDER — SIMVASTATIN 20 MG PO TABS: 20.0000 mg | ORAL_TABLET | Freq: Every day | ORAL | Status: DC

## 2011-04-24 MED ORDER — EZETIMIBE 10 MG PO TABS: 10.0000 mg | ORAL_TABLET | Freq: Every day | ORAL | Status: DC

## 2011-04-30 NOTE — H&P (Signed)
NAME:  ZENOBIA, KUENNEN NO.:  [id number]   MEDICAL RECORD NO.:  [id number]          PATIENT TYPE:  INP   LOCATION:  3707                         FACILITY:  [facility]   PHYSICIAN:  Nanetta Batty, M.D.   DATE OF BIRTH:  [birth year]   DATE OF ADMISSION:  08/25/2007  DATE OF DISCHARGE:                              HISTORY & PHYSICAL   PRIMARY CARDIOLOGIST:  Antonieta Iba, MD   PRIMARY CARE PHYSICIAN:  Loma Sender   CHIEF COMPLAINT:  Chest pain and multivessel coronary artery disease.   HISTORY OF PRESENT ILLNESS:  Ms. Tabares is a 77 year old female who  presented to [facility] with complaints of chest  pain.  She reports experiencing 9/10 chest discomfort, requiring 3  sublingual nitroglycerin before mild resolution of her discomfort.  She  also pretreated herself with aspirin 325 mg and presented to primary  care physician's office, who immediately referred her to the emergency  department at [facility].  She had had several  episodes of chest discomfort in the preceding weeks, all of these  associated with physical exertion.  She underwent cardiac  catheterization on August 24, 2007 by Dr. Mariah Milling, which revealed  multivessel coronary artery disease; thus she is transferred to [facility] for admission and consultation with surgery.   PAST MEDICAL HISTORY:  1. Diabetes mellitus.  2. Dyslipidemia.  3. Tobacco abuse.  4. Hypertension.  5. Peripheral vascular disease with known bilateral renal artery      stenosis 80% in 2006, as well as bilateral iliac disease with 50%      on the right and 70% on the left.  6. 50-69% bilateral carotid stenosis.   FAMILY HISTORY:  Positive for coronary artery disease, hypertension, and  diabetes mellitus.   SOCIAL HISTORY:  She does have a history of tobacco use.  She stopped  smoking in 2008.   ALLERGIES:  Silvadene causes a rash.   CURRENT MEDICATIONS:  1.  Aspirin 325 mg daily.  2. Plavix 75 mg daily.  3. Metoprolol 25 mg p.o. q.6 hours.  4. Colace 100 mg p.o. b.i.d.  5. Simvastatin 20 mg q.h.s.  6. KCl 20 mEq daily.  7. Cilostazol 50 mg p.o. b.i.d.  8. Cardizem CD 240 mg p.o. q.a.m.  9. Zetia 10 mg daily.   REVIEW OF SYSTEMS:  She denies any fever or chills, cough, sputum  production, abdominal pain, diarrhea, nausea, vomiting or constipation.  She denies any shortness of breath or exertional dyspnea.  She does  experience chest discomfort as per history of present illness.  All  other systems have been reviewed and are negative except for as per  history of present illness.   PHYSICAL EXAMINATION:  VITAL SIGNS:  Blood pressure 117/62, pulse 76 and  regular, respirations 18, temperature 97.9.  She is saturating 97% on  room air.  Her blood sugar on arrival to [facility] is 206.  GENERAL:  This is a 77 year old female in no acute distress.  HEENT:  Pupils are equal and reactive to light and accommodation.  Extraocular movements intact.  Conjunctiva are pink.  NECK:  Supple.  No JVD or thyromegaly.  CARDIOVASCULAR:  Regular rate and rhythm.  S1 and S2.  No murmur, gallop  or rub.  There are bilateral carotid bruits.  LUNGS:  Clear to auscultation bilaterally with normal respiratory  effort.  ABDOMEN:  Soft, nontender.  No hepatosplenomegaly or masses.  EXTREMITIES:  Radial, femoral and dorsalis pedis arteries are present  without lower extremity edema.  No clubbing, cyanosis, or ulcers.  NEUROLOGIC:  Oriented to person, place and time.  Normal mood and  affect.   LABORATORY DATA:  From August 24, 2007, glucose was 307, BUN 30,  creatinine 1.5, sodium 130, chloride 91.  White blood cell count is  10.7.  Hemoglobin is 10.7, hematocrit 31.2.  Troponin is less than 0.1.  CK 49, CK-MB 1.0.  PT 11.7, INR is 0.9, PTT 30.3.  Total cholesterol  139, triglycerides 153, HDL 41, LDL 67.   Cardiac catheterization performed.  The RCA was  occluded in the proximal  region.  There was severe proximal LAD disease with a discrete 80-90%  lesion.  There was severe ostial OM-1 disease with 90% stenosis.  There  were left-to-right collaterals from the distal LAD and circumflex.  LIMA  shot revealed mild proximal disease.  Abdominal aortogram revealed  moderate to moderately severe renal artery stenosis bilaterally with  moderate bilateral iliac disease.   IMPRESSION:  1. Multivessel coronary artery disease.  2. Exertional chest discomfort.  3. Remote tobacco use.  4. Family history of coronary disease.  5. Dyslipidemia.  6. Hypertension.  7. Diabetes mellitus.   PLAN:  We will continue to monitor her closely and consult cardiac  surgery in the morning to discuss coronary artery bypass graft with her  and make recommendations, as well as review the risks and benefits of  the procedure.  Her Plavix has been discontinued.  We will continue her current  medications, as she is stable at this time.  We will check a CBC and a  BMP in the morning.  We will continue her aspirin and start heparin per  pharmacy with no bolus.     ______________________________  Charmian Muff, NP      Nanetta Batty, M.D.  Electronically Signed    LS/MEDQ  D:  08/25/2007  T:  08/26/2007  Job:  [id number]   cc:   Loma Sender  [place] [facility]

## 2011-04-30 NOTE — Op Note (Signed)
NAME:  Donna Lane, Donna Lane NO.:  [id number]   MEDICAL RECORD NO.:  [id number]          PATIENT TYPE:  INP   LOCATION:  2304                         FACILITY:  [facility]   PHYSICIAN:  Salvatore Decent. Dorris Fetch, M.D.DATE OF BIRTH:  1925/01/30   DATE OF PROCEDURE:  08/31/2007  DATE OF DISCHARGE:                               OPERATIVE REPORT   PREOPERATIVE DIAGNOSIS:  Severe three-vessel coronary disease with  unstable angina.   POSTOPERATIVE DIAGNOSIS:  Severe three-vessel coronary disease with  unstable angina.   PROCEDURE:  Median sternotomy, extracorporeal circulation, coronary  artery bypass grafting x4 (left internal mammary to LAD, sequential  saphenous vein graft first diagonal and obtuse marginal, saphenous vein  graft to posterior descending), endoscopic vein harvest, left leg.   SURGEON:  Salvatore Decent. Dorris Fetch, M.D.   ASSISTANT:  Theda Belfast, P.A.   ANESTHESIA:  General.   FINDINGS:  No usable vein found in the right leg.  Left vein  satisfactory.  Severe sternal osteoporosis.  Mammary satisfactory.  Good-  quality targets.  Aorta normal at site of proximal anastomoses.  Emphysema.   CLINICAL NOTE:  Donna Lane is a 77 year old woman who presented with  unstable angina.  She has multiple cardiac risk factors and known  peripheral vascular disease.  On cardiac catheterization, she had three-  vessel coronary disease and was referred for coronary artery bypass  grafting.  The patient was advised of the indications, risks, benefits,  and alternatives.  She understood and accepted the risks and agreed to  proceed.  Surgery was delayed while waiting for the patient's Plavix to  wear off.  The first acceptable operating date was August 31, 2007.   OPERATIVE NOTE:  Donna Lane was brought to the preoperative holding  area on August 31, 2007.  There, the anesthesia service under the  direction of Dr. Claybon Jabs placed lines for monitoring  arterial  blood pressures as well as a Swan-Ganz catheter.  Intravenous  antibiotics were administered.  She was taken to the operating room,  anesthetized, and intubated.  A Foley catheter was placed.  The chest,  abdomen, and legs were prepped and draped in the usual fashion.   An incision was made in the medial aspect of the right leg at the level  of the knee, and search for a usable saphenous vein was unsuccessful.  An incision was made on the left side.  A saphenous vein was identified  and was harvested endoscopically from the thigh.  At the level of the  knee, the vein bifurcated and was small in both branches below the knee.  Therefore, it was not harvested below the knee.   Simultaneously, a median sternotomy was performed.  The patient had  severe sternal osteoporosis.  The left internal mammary artery was  harvested using standard technique.  It was relatively small caliber but  acceptable quality.  It did have good flow.  5000 units of heparin was  administered during the vessel harvest.  The remainder of the full  heparin dose was given prior to opening the pericardium.   The pericardium was opened.  The ascending aorta was inspected.  There  was some lateral plaquing superiorly at the pericardial reflection.  This did not interfere with placement of the cannula, crossclamp, or  proximal anastomoses.  The aorta was cannulated via concentric 2-0  Ethibond pledgeted pursestring sutures.  A dual-stage venous cannula was  placed via pursestring suture in the right atrial appendage.  Cardiopulmonary bypass was instituted, and the patient was cooled to 32  degrees Celsius.  The coronary arteries were inspected, and anastomotic  sites were chosen.  The conduits were inspected and cut to length.  A  foam pad was placed in the pericardium to protect the left phrenic  nerve.  A temperature probe was placed in myocardial septum.  A  cardioplegia cannula was placed in the ascending  aorta.   The aorta was crossclamped.  The left ventricle was emptied via the  aortic root vent.  Cardiac arrest was then achieved with a  combination  of cold antegrade blood cardioplegia and topical iced saline.  600 mL of  cardioplegia was administered.  The myocardial septal temperature fell  to 8 degrees Celsius.  The following distal anastomoses were performed.   First, a reverse saphenous vein graft was placed end-to-side to the  posterior descending branch of the right coronary.  This branch ran  obliquely across the inferior margin of the heart.  This supplied the  posterior descending distribution.  It was in communication with the  posterolateral branch.  The vein was anastomosed end-to-side with a  running 7-0 Prolene suture.  At the completion of each vein graft,  cardioplegia was administered to assess flow and inspect for hemostasis.   Next, a reverse saphenous vein graft was placed sequentially to the  first diagonal and obtuse marginal.  Normally these would have been done  separately, but with the limited vein, this was the best option.  A side-  to-side anastomosis was performed to the first diagonal.  It was a 1.5-  mm good-quality target.  An end-to-side was performed to the first  obtuse marginal which was also 1.5-mm good-quality target.  Both  anastomoses were probed proximally and distally.  There was good flow  through the graft and good hemostasis.   Next, the left internal mammary artery was brought through a window in  the pericardium.  The distal end was beveled.  It was then anastomosed  end-to-side to the distal LAD.  The LAD was a 2-mm good-quality target.  The mammary was a good-quality conduit.  The anastomosis was performed  with a running 8-0 Prolene suture.  At the completion of the mammary to  LAD anastomosis, the bulldog clamp was briefly removed to inspect for  hemostasis.  Immediate rapid septal rewarming was noted.  The bulldog  clamp was  replaced.   Additional cardioplegia was administered.  The vein grafts were cut to  length.  The cardioplegia cannula then was removed from the ascending  aorta, and the proximal vein graft anastomoses were performed to 4.5-mm  punch aortotomies with running 6-0 Prolene sutures while under  crossclamp.  At the completion of the final proximal anastomosis, the  patient was placed in Trendelenburg position.  Lidocaine was  administered.  Bulldog clamps again removed from the left mammary  artery.  The aortic root was de-aired, and the aortic crossclamp was  removed.  The total crossclamp time was 61 minutes.   While the patient was being rewarmed, all proximal and distal  anastomoses were inspected for hemostasis.  She did require two  defibrillations with 20 joules and then remained in sinus rhythm  thereafter.  Epicardial pacing wires were placed on the right ventricle  and right atrium.  When the patient had rewarmed to a core temperature  of 37 degrees Celsius, she was weaned from cardiopulmonary bypass  without difficulty on the first attempt.  Total bypass time was 91  minutes.   A test dose protamine was administered and was well-tolerated.  The  atrial and aortic cannulae were removed.  The remainder of protamine was  administered without incident.  The chest was irrigated with 1 liter of  warm normal saline containing 1 gram of vancomycin.  Hemostasis was  achieved.  The pericardium was not closed.  A single mediastinal and  left pleural chest tubes were placed through separate subcostal  incisions.  The sternum was closed with interrupted heavy gauge double  stainless steel wires.  The remaining incisions were closed in standard  fashion.  Subcuticular closure was used for the skin.  All sponge, needle, and  instrument counts were correct at the end of the procedure.   The patient was taken from the operating room to the surgical intensive  care unit in critical but stable  condition.      Salvatore Decent Dorris Fetch, M.D.  Electronically Signed     SCH/MEDQ  D:  08/31/2007  T:  08/31/2007  Job:  [id number]   cc:   Thereasa Solo. Little, M.D.  Celine Mans, MD

## 2011-04-30 NOTE — Assessment & Plan Note (Signed)
OFFICE VISIT   PUALANI, BORAH  DOB:  10-07-1925                                        October 23, 2007  CHART #:  [id number]   PROBLEM LIST:  1. Recurrent left pleural effusion status post CABG September 2008 by      Dr. Dorris Fetch.  2. Status post thoracentesis October 05, 2007, 1.2 liters by Dr.      Dorris Fetch for postoperative pleural effusion.   HISTORY OF PRESENT ILLNESS:  Ms. Brian is a 77 year old ex-smoker  who had multi-vessel bypass surgery in mid September for unstable angina  and 3 vessel disease. She did well postoperatively but has developed a  left pleural effusion of inflammatory etiology. She had some shortness  of breath when she was evaluated in the office 3 weeks ago and a large  pleural effusion was tapped of 1.2 liters. She now returns for followup  chest x-ray. She denies any shortness of breath and is enrolled in  outpatient cardiac rehabilitation. She is not taking any pain  medication. She denies cough.   PHYSICAL EXAMINATION:  VITAL SIGNS:  Blood pressure 150/70, pulse 70,  respiratory rate 18, saturation 93%.  LUNGS:  She had diminished breath sounds at the left base, slight.  HEART:  Rhythm is regular. Sternal and leg incisions are healing well.  There is trace pedal edema.   LABORATORY DATA:  Chest x-ray showed some recurrence of the left pleural  effusion, which is mild to moderate.   IMPRESSION/PLAN:  The patient has a recurrent inflammatory postoperative  left pleural effusion. She is taking 20 mg of Lasix a day. She was  recently started back on Plavix by cardiology. I have recommended that  the patient increase her Lasix to 40 mg daily and stop the Plavix and we  will repeat the x-ray in 1 week. If the  x-ray still shows a recurrent significant effusion, she will undergo a  thoracentesis at the office. The patient understands this plan and  agrees.   Kerin Perna, M.D.  Electronically Signed   PV/MEDQ  D:  10/23/2007  T:  10/24/2007  Job:  [id number]

## 2011-04-30 NOTE — Assessment & Plan Note (Signed)
OFFICE VISIT   CHISA, KUSHNER  DOB:  28-May-1925                                        October 30, 2007  CHART #:  [id number]   CURRENT PROBLEMS:  1. Left pleural effusion status post CABG in September 2008 by Dr.      Dareen Piano.  2. Status post thoracentesis October 20 of 1 liter.   PRESENT ILLNESS:  Ms. Shehan is a 77 year old ex-smoker who presents  for final office visit with chest x-ray. She has been doing well and  returns for a follow up chest x-ray after a prior thoracentesis three  weeks ago. She denies any real symptoms and is progressing well after  her surgery.   On exam her saturation is 96%, pulse 72, blood pressure 122/70. She is  alert and pleasant. Breath sounds are clear and equal. Her cardiac  rhythm is regular.   AP and lateral chest x-ray shows insignificant very small left pleural  effusion. She was encouraged to continue her rehab and walking program  and to continue to do the spirometer. She does not need a thoracentesis  nor any further special x-ray studies. She will return p.r.n.   Kerin Perna, M.D.  Electronically Signed   PV/MEDQ  D:  10/30/2007  T:  10/31/2007  Job:  [id number]   cc:   Antonieta Iba, MD

## 2011-04-30 NOTE — Assessment & Plan Note (Signed)
OFFICE VISIT   CORAH, WILLEFORD  DOB:  08/04/1925                                        October 05, 2007  CHART #:  [id number]   PATIENT: Donna Lane.  DOB: 02-21-1925.   The patient returns to the office today for follow up appointment from  undergoing coronary artery bypass grafting x four done August 31, 2007.  The patient had pretty unremarkable postoperative course.  She  was able to be discharged to home in stable condition by postop day four  or five.  She returns today for follow up appointment. The patient  states that she feels that she is doing well. She does complain of  shortness of breath and gets tired easily with ambulation. She is  tolerating appetite. Very mild incisional pain noted. She has been seen  by Dr. Mariah Milling already and states that she has been scheduled for a renal  artery ultrasound.  The patient has been monitoring her diabetes closely  and has been stable.  She does complain that she is scared to lie flat  and has been sleeping upright in a chair.   PHYSICAL EXAMINATION:  VITALS: Blood pressure 132/86. Pulse 84.  Respirations of 20. 92% on room air.  RESPIRATORY: Diminished breath  sounds left lower lobe. CARDIAC:  Regular rate and rhythm S1 and S2  noted.  No murmurs noted.  ABDOMEN: Bowel sounds x 4, soft and  nontender. EXTREMITIES: No edema noted.  INCISIONS: The incisions are  clean, dry and intact and healing well. No drainage or cellulitis noted.   STUDIES:  Chest x-ray done two weeks ago showed a left pleural effusion.   PROCEDURES:  Due to chest x-ray showing left pleural effusion, the  patient complaining of shortness of breath. Consent was signed for a  left thoracentesis to be done in the office.  The patient was prepped  and draped in usual fashion.  Lidocaine used for local. Catheter  inserted without difficulty and 1350 cc of clear sanguineous fluid was  removed. The catheter removed and bandage  placed. The patient tolerated  the procedure well. Sent down for follow up chest x-ray. Currently non  available.   IMPRESSION AND PLAN:  The patient is status post coronary artery bypass  grafting with left pleural effusion. As stated above, she tolerated left  thoracentesis well.  Await PA and lateral chest x-ray for further  evaluation.  The patient is told to continue current medications. She is  to call [facility] to arrange for rehab. Told can start at  any time.  She is to continue using her incentive spirometer.  The  patient told that if she develops any significant shortness of breath or  chest pain, she is to contact our office.  She is to continue following  up with Dr. Mariah Milling as directed. She is to continue ambulating three or  four times a day progressing as tolerating.  I recommended a trial of  steroids. The patient has  had a reaction to them and did not want to  take them.  The patient was seen and evaluated by Dr. Dorris Fetch. Will  plan follow up with the patient in two weeks with chest x-ray.   Salvatore Decent Dorris Fetch, M.D.  Electronically Signed   KMD/MEDQ  D:  10/05/2007  T:  10/06/2007  Job:  [id number]   cc:   Salvatore Decent. Dorris Fetch, M.D.

## 2011-04-30 NOTE — Discharge Summary (Signed)
NAME:  Donna Lane, Donna Lane NO.:  [id number]   MEDICAL RECORD NO.:  [id number]          PATIENT TYPE:  INP   LOCATION:  2033                         FACILITY:  [facility]   PHYSICIAN:  Salvatore Decent. Dorris Fetch, M.D.DATE OF BIRTH:  02-10-1925   DATE OF ADMISSION:  08/25/2007  DATE OF DISCHARGE:                               DISCHARGE SUMMARY   FINAL DIAGNOSIS:  Severe three-vessel coronary artery disease with  unstable angina.   IN-HOSPITAL DIAGNOSES:  1. Acute blood loss anemia postoperatively.  2. Volume overload postoperatively.   SECONDARY DIAGNOSES:  1. Diabetes mellitus.  2. Dyslipidemia.  3. Tobacco abuse.  4. Hypertension.  5. Peripheral vascular disease with known bilateral renal artery      stenosis 80% 2006, as well as bilateral iliac diseases 50% on the      right and 70% on the left.  6. A 50-60% bilateral carotid stenosis.  7. Chronic obstructive pulmonary disease.   IN-HOSPITAL OPERATIONS AND PROCEDURES:  Coronary artery bypass grafting  x4 using a left internal mammary artery to left anterior descending,  sequential saphenous vein graft to first diagonal and obtuse marginal,  saphenous vein graft to posterior descending.  Endoscopic vein  harvesting from left leg.   HISTORY AND PHYSICAL AND HOSPITAL COURSE:  A 77 year old female who  presented with unstable angina.  The patient presented to [facility] with complaints of chest pain.  The patient  underwent cardiac catheterization August 24, 2007, by Dr. Mariah Milling at  [facility].  This revealed multivessel coronary artery disease.  Dr.  Mariah Milling recommended transfer to [facility].  The  patient was transferred under Dr. Allyson Sabal.  Dr. Dorris Fetch was consulted  for evaluation for potential coronary artery bypass grafting.  He  evaluated cardiac catheterization and discussed with the patient  undergoing coronary artery bypass grafting.  Risks and benefits were  discussed with the patient.  The patient acknowledged her understanding  and agreed to proceed.  Preoperatively, the patient did have carotid  Duplex ultrasound showing bilateral 40% to 50% ICA stenosis.  She also  had preoperative ABIs done showing bilateral to be 1.  Due to the  patient being on Plavix preoperatively, her surgery was not scheduled  until Plavix washed out.  She was scheduled for August 31, 2007.  Initially the patient was going to be discharged to home until the date  of surgery.  She developed acute blood loss anemia.  Hemoccult was  checked and was noted to be negative.  The patient was stable and blood  loss improved.  She did remain in the hospital preoperatively.   For details of the patient's past medical history and physical exam,  please see dictated H&P.   The patient was taken to the operating room August 31, 2007, where  she underwent coronary artery bypass grafting x4 using a left internal  mammary artery to left anterior descending, sequential saphenous vein  graft to first diagonal and obtuse marginal, saphenous vein graft to  posterior descending.  Endoscopic vein harvest from the left leg done.  The patient tolerated this procedure well  and was transferred to the  intensive care unit in stable condition.  Postoperatively, the patient  was noted to be hemodynamically stable.  She was extubated the evening  of surgery.  Post extubation, the patient noted to be alert and oriented  x4 and neurologically intact.  Postop day #1, the patient's vital signs  were noted to be stable.  She was able to be weaned from all drips.  Swan-Ganz catheter discontinued in normal fashion.  The patient was  noted to be in normal sinus rhythm.  Her pulmonary status was stable on  2 liters nasal cannula.  Postoperative chest x-ray was stable.  The  patient had minimal drainage from chest tubes and chest tubes were  discontinued in normal fashion.  The patient did develop  leukocytosis on  postop day #1 with a white count 18.8.  No signs of infection.  The  patient was afebrile.  This was monitored.  The patient was out of bed  and ambulating with cardiac rehab postop day #1.  She was transferred to  [facility] in stable condition postop day #1.  While on telemetry floor, the  patient continued to progress well.  Her vital signs continued to be  monitored and remained stable.  She remained afebrile.  The patient was  able to be weaned off oxygen sating greater than 90% on room air.  She  did have slight volume overload requiring diuretics.  The patient was  back near baseline prior to discharge home.  The patient's blood sugars  were also followed due to a history of diabetes mellitus.  She was  placed on sliding scale insulin.  The patient was not on any  preoperative diabetic medications.  Blood sugars were stable but  slightly elevated.  They were continued to be monitored during the  remainder of her postoperative course.  Postop day #3, the patient's  leukocytosis improving back near normal.  It decreased to 14.9.  The  patient did develop slight acute blood loss anemia but was asymptomatic.  Hemoglobin and hematocrit 10 and 30.1% postop day #3.  The patient  continued to remain in normal sinus rhythm.  Pulmonary status remained  stable.  Incisions were clean, dry and intact and healing well.  The  patient was out of bed and ambulating well as assistance.  She was  tolerating diet well.  No nausea or vomiting noted.   Labs on postop day #3 showed a white count 14.9, hemoglobin and 10,  hematocrit 30.1, platelet count of 100.  Sodium of 135, potassium 3.8,  chloride 105, bicarb of 25, BUN 18, creatinine 1.38, glucose of 132.   The patient is tentatively ready for discharge home in the next 48 hours  pending she remains stable.   FOLLOW-UP APPOINTMENTS:  Follow-up appointment has been arranged with  Dr. Dorris Fetch for September 21, 2007, at 11:45 a.m..  The  patient will  need to obtain PA and lateral chest x-ray 30 minutes prior to this  appointment.  The patient will need to follow up with Dr. Mariah Milling in two  weeks.  She will need to contact Dr. Windell Hummingbird office to make these  arrangements.   ACTIVITY:  Patient instructed no driving until released to do so, no  heavy lifting over 10 pounds.  She is told to ambulate 3-4 times per  day, progress as tolerated and continue her breathing exercises.   INCISIONAL CARE:  The patient is told to shower, washing her incisions  using soap and  water.  She is contact the office if she develops any  drainage or opening from any of her incision sites.   DIET:  The patient educated on diet to be low-fat, low-salt as well as  carbohydrate modified medium calorie diet.   DISCHARGE MEDICATIONS:  1. Aspirin 325 mg daily.  2. Lopressor 50 mg b.i.d.  3. Zocor 20 mg daily.  4. Pletal 50 mg b.i.d.  5. Zetia 10 mg daily.  6. Plavix 75 mg daily.  7. Lasix 40 mg daily x7 days.  8. Potassium chloride 20 mEq daily x7 days.  9. Zocor 20 mg daily.  10.Oxycodone 5 mg one to two tablets q.4-6h. p.r.n. pain.      Theda Belfast, PA      Salvatore Decent. Dorris Fetch, M.D.  Electronically Signed    KMD/MEDQ  D:  09/03/2007  T:  09/03/2007  Job:  [id number]   cc:   Antonieta Iba, MD

## 2011-04-30 NOTE — Consult Note (Signed)
NAME:  GISELL, BUEHRLE NO.:  [id number]   MEDICAL RECORD NO.:  [id number]          PATIENT TYPE:  INP   LOCATION:  3707                         FACILITY:  [facility]   PHYSICIAN:  Salvatore Decent. Dorris Fetch, M.D.DATE OF BIRTH:  26-Mar-1925   DATE OF CONSULTATION:  08/26/2007  DATE OF DISCHARGE:                                 CONSULTATION   HISTORY OF PRESENT ILLNESS:  Ms. Grassi is a 77 year old woman who  presents with a chief complaint of chest pain.  Ms. Funez has a  history of heavy tobacco abuse, hypertension, diabetes and chronic  obstructive pulmonary disease.  She also has a history of severe diffuse  atherosclerotic cardiovascular disease including known prior coronary  disease which has been managed medically.  She now presents with  unstable angina.  She presented with several episodes of chest  discomfort after playing golf and then the day of admission she had a  severe episode while playing golf between the fourth and fifth holes  where she had a severe substernal chest pain with shortness of breath.  She could not continue on.  She took three nitroglycerin with partial  relief and then went to her doctor's office and was sent immediately to  the emergency room.  She was admitted and then underwent cardiac  catheterization where she was found to have severe three-vessel coronary  disease with a total occlusion of her right coronary, 90% stenosis in  her LAD and 90% stenosis in her circumflex.  She has been pain free  since admission.   PAST MEDICAL HISTORY:  Significant for atherosclerotic cardiovascular  disease with known extracranial carotid obstructive disease, iliac  disease and bilateral renal artery stenosis, hypertension, smoking,  chronic obstructive pulmonary disease, hyperlipidemia, adult-onset non-  insulin-dependent diabetes.   MEDICATIONS ON ADMISSION:  1. Aspirin 325 mg daily.  2. Plavix 75 mg daily.  3. Metoprolol 25 mg p.o. q. 6  hours.  4. Simvastatin 20 mg nightly.  5. Cilostazol 50 mg b.i.d.  6. Cardizem CD 240 mg daily.  7. Zetia 10 mg daily.  8. She has also been on potassium and Colace since admission.   ALLERGIES:  She has an allergy to SILVADENE.   FAMILY HISTORY:  Significant for hypertension, diabetes and coronary  artery disease.   SOCIAL HISTORY:  She does have a history of tobacco abuse.  She stopped  smoking in January but refuses to say that she has quit smoking.  She is  active.  She lives with her husband.  She plays golf on a regular basis.   REVIEW OF SYSTEMS:  See HPI.  She denies any recent difficulty with COPD  in terms of wheezing or recent respiratory infections.  She states that  she has bruised easily ever since she has been on Plavix.  She denies  any stroke or TIA symptoms.  All other systems are negative.   PHYSICAL EXAMINATION:  GENERAL:  This is an 77 year old white female in  no acute distress.  Her in general appearance is a well-developed and  well-nourished.  NEUROLOGICALLY:  She is alert, oriented x3.  She  is appropriate, grossly  intact.  HEENT EXAM:  Unremarkable.  NECK:  Neck is supple.  There are bilateral carotid bruits.  No  thyromegaly or masses.  CARDIAC:  Regular rate and rhythm.  Normal S1-S2.  No rubs, murmurs or  gallops.  LUNGS:  Clear with equal but distant breath sounds  bilaterally.  ABDOMEN:  Soft and nontender.  EXTREMITIES:  No palpable pulses in the lower extremity.  She does have  palpable radial pulses.   LABORATORY DATA:  Cardiac catheterization is dictated.  Chest x-ray  shows prominence of the right mediastinum.  There is a question whether  this is a dilated ascending aorta versus mediastinal mass.  Her carotid  Doppler showed 40 to 60% stenosis bilaterally.  Vertebral flow was  antegrade bilaterally.  ABIs were 1.0 bilaterally.  Her CK was 80 with  MB of 2.3, troponin was 0.02.  TSH 3.24.  Sodium 135, potassium 3.5.  BUN and creatinine 19  and 1.2.  White count was 8.2, hematocrit 26.5 and  platelets 255.   IMPRESSION:  This is a 77 year old active woman who presents with  unstable anginal symptoms which are new onset.  She has had only  exertional pain but her last episode with severe and prolonged and not  easily relieved with nitroglycerin.  She was admitted [facility].  She ruled out for myocardial infarction but the catheterization had  severe three-vessel coronary disease.  She does have other significant  medical problems including hypertension, diabetes, anemia, history of  heavy tobacco abuse and severe peripheral vascular disease.   Coronary bypass grafting is feasible.  She does have acceptable target  vessels but she would be at increased risk for complications including  stroke, renal failure, respiratory failure, pneumonia as well as general  pulmonary difficulties.  I discussed in detail with the patient and her  husband the nature and extent of the operation including the need for  general anesthesia, incision to be used, use of mammary artery and  saphenous vein for bypassing and the use of the heart lung machine.  They understand the risks of surgery include but are not limited to  death, stroke, MI, DVT, PE, bleeding, possible need for transfusions,  infection as well as other organ system dysfunction including  respiratory, renal, hepatic or GI complications.  She understands these  risks but does not with to make a final decision.  At this point we  still need more information regarding her medical condition.  She has a  CT of the chest scheduled to further evaluate her mediastinum to see if  there could be an aneurysmal aorta or more importantly a mediastinal  mass and she also needs pulmonary function testing with and without  bronchodilators and room air blood gas to assess her baseline pulmonary  status.   The patient has been on Plavix chronically and received it up through  yesterday.   She states that since she has been on Plavix she has bled  and bruised easily as she is currently clinically stable she should wait  for Plavix to wash out of her system and repopulate her platelet counts  prior to having surgery to avoid excessive risk for bleeding take back  and transfusion related complications.   We will meet with Ms Capuano again tomorrow following her CT and  pulmonary function testing and discuss these issues further.  Thank you  very much for allowing me to see Ms. Narain.      Salvatore Decent  Dorris Fetch, M.D.  Electronically Signed     SCH/MEDQ  D:  08/26/2007  T:  08/27/2007  Job:  [id number]   cc:   Nanetta Batty, M.D.  Antonieta Iba, MD  Loma Sender

## 2011-05-03 NOTE — Cardiovascular Report (Signed)
NAME:  REIKO, VINJE NO.:  [id number]   MEDICAL RECORD NO.:  [id number]          PATIENT TYPE:  OIB   LOCATION:  2899                         FACILITY:  [facility]   PHYSICIAN:  Nanetta Batty, M.D.   DATE OF BIRTH:  1925/04/05   DATE OF PROCEDURE:  05/30/2005  DATE OF DISCHARGE:                              CARDIAC CATHETERIZATION   INDICATIONS:  Ms. Karapetian is a 77 year old female with history of  worsening claudication.  She has fairly normal ABIs.  However, Doppler  suggested moderate left common iliac artery stenosis.  Other problems  include hypertension and diabetes.  She presents now for angiography and  potential intervention.   PROCEDURE DESCRIPTION:  The patient was brought to the sixth floor [facility] in a postabsorptive state.  She  was premedicated with p.o. Valium.  Left groin was prepped and shaved in the  usual sterile fashion.  1% Xylocaine was used for local anesthesia.  A 5  French sheath was inserted into the left femoral artery using standard  Seldinger technique. A 5 French tennis racquet catheter, end-hole, and short  right Judkins catheter was used for midstream and distal abdominal  aortography with bifemoral runoff.  A pullback gradient was performed using  end-hole catheter across the distal aorta and left common iliac artery.  Selective right and left renal artery angiography was also performed.  Visipaque dye was used through the entirety of the case.  Retrograde aortic  pressure was monitored during the case.   ANGIOGRAPHIC RESULTS:  1.  Abdominal aorta.      1.  Renal arteries:  80% bilateral renal artery stenosis with 50-mm left          and 30-mm right renal artery gradient.      2.  Distal abdominal aorta:  2.  50-60% diffuse segmental distal abdominal aortic atherosclerotic      narrowing.  3.  Left lower extremity:      1.  60-70% proximal segmental left common iliac artery  stenosis.          Pullback gradient performed across the distal aorta and proximal          left common iliac artery revealed 40-mm gradient.      2.  The left SFA was mild and diffusely diseased.  It has three-vessel          runoff.  4.  Right lower extremity.      1.  40% proximal right common iliac artery stenosis.      2.  One-vessel runoff the anterior tibial.   IMPRESSION:  Ms. Wissinger has diffuse distal abdominal aortic  atherosclerotic narrowing with extension in both iliacs; left greater than  right.  I believe her claudication is most likely coming from her  infrapopliteal disease which makes her right greater than her left though  she does have some in-flow issues regarding her distal aorta.  She also has  previously unrecognized high-grade bilateral renal artery stenosis which  may be contributing to her  hypertension.  She will need outpatient renal Doppler studies.  At  this  point, I am not going to address her renal arteries.  She will be hydrated,  discharged home today as an outpatient.  We will see her back in the office  in approximately two weeks in followup.  She left the lab in stable  condition.       JB/MEDQ  D:  05/30/2005  T:  05/30/2005  Job:  [id number]   cc:   [facility]  [address]  [place], [place] [address]   Thereasa Solo. Little, M.D.  [address]  [address]  [place]  [place] [address]  Fax: [phone]   Loma Sender  [address]  [place]  [place] [address]  Fax: [phone]

## 2011-05-06 ENCOUNTER — Other Ambulatory Visit: Payer: Self-pay

## 2011-05-06 ENCOUNTER — Other Ambulatory Visit: Payer: Self-pay | Admitting: Cardiovascular Disease

## 2011-05-06 MED ORDER — METOPROLOL TARTRATE 50 MG PO TABS: 50.0000 mg | ORAL_TABLET | Freq: Two times a day (BID) | ORAL | Status: DC

## 2011-05-28 ENCOUNTER — Telehealth: Payer: Self-pay | Admitting: Cardiovascular Disease

## 2011-05-28 NOTE — Telephone Encounter (Signed)
Pt cannot tolerate the generic Plavix that was called in.  Made her sick and she has stopped the medication.  Would like for Plavix to be called in.  Pt would like a call back.

## 2011-05-28 NOTE — Telephone Encounter (Signed)
Pharmacy is going to fill Plavix for pt again. Did not tolerate the generic. Notified patient to call me back with any other problems.

## 2011-07-25 ENCOUNTER — Telehealth: Payer: Self-pay

## 2011-07-25 ENCOUNTER — Other Ambulatory Visit: Payer: Self-pay

## 2011-07-25 MED ORDER — CLOPIDOGREL BISULFATE 75 MG PO TABS: 75.0000 mg | ORAL_TABLET | Freq: Every day | ORAL | Status: DC

## 2011-07-25 NOTE — Telephone Encounter (Signed)
Need a refill for brand name plavix sent to [facility] [address].

## 2011-07-25 NOTE — Telephone Encounter (Signed)
Notified pharmacist need to have the brand name plavix for 30 day supply with 6 refills to [facility] [address].

## 2011-09-16 ENCOUNTER — Encounter: Payer: Self-pay | Admitting: Cardiovascular Disease

## 2011-09-18 ENCOUNTER — Ambulatory Visit: Payer: PRIVATE HEALTH INSURANCE | Admitting: Cardiovascular Disease

## 2011-09-25 ENCOUNTER — Encounter: Payer: Self-pay | Admitting: Cardiovascular Disease

## 2011-09-25 ENCOUNTER — Ambulatory Visit (INDEPENDENT_AMBULATORY_CARE_PROVIDER_SITE_OTHER): Payer: Medicare Other | Admitting: Cardiovascular Disease

## 2011-09-25 DIAGNOSIS — I25810 Atherosclerosis of coronary artery bypass graft(s) without angina pectoris: Secondary | ICD-10-CM

## 2011-09-25 DIAGNOSIS — I739 Peripheral vascular disease, unspecified: Secondary | ICD-10-CM

## 2011-09-25 DIAGNOSIS — I10 Essential (primary) hypertension: Secondary | ICD-10-CM

## 2011-09-25 DIAGNOSIS — E785 Hyperlipidemia, unspecified: Secondary | ICD-10-CM

## 2011-09-25 DIAGNOSIS — E119 Type 2 diabetes mellitus without complications: Secondary | ICD-10-CM

## 2011-09-25 MED ORDER — ASPIRIN 81 MG PO TABS: 162.0000 mg | ORAL_TABLET | Freq: Every day | ORAL | Status: DC

## 2011-09-25 NOTE — Assessment & Plan Note (Signed)
We will try to pain the most recent blood work from Dr. Vear Clock. Goal LDL is less than 70.

## 2011-09-25 NOTE — Assessment & Plan Note (Signed)
Blood pressure is well controlled on today's visit. No changes made to the medications. 

## 2011-09-25 NOTE — Progress Notes (Signed)
Patient ID: Donna Lane, female    DOB: 1925-06-12, 77 y.o.   MRN: [id number]  HPI Comments: Donna Lane is a pleasant 77 year old woman with history of coronary artery disease, catheterization August 25, 2007 showing severe three-vessel disease, with bypass surgery at [facility] September 2008 also with severe peripheral vascular disease, bilateral renal artery stenosis, carotid disease, iliac disease who presents for Office visit 4 routine visit.  She reports that the previous episodes of chest pain reported on her last clinic visit have resolved by decreasing the dose of her aspirin from 325 mg daily to 81 mg daily. She feels well, denies any symptoms. No edema, no shortness of breath beyond her mild baseline shortness of breath. She did smoke for many years but does not believe that she needs inhalers or oxygen. She reports that she had recent blood work with Dr. Loma Sender.   LE u/s 05/2009 suggest  50% distal aorta disease, right common iliac artery with equal or less than 50% disease, left common iliac with equal or greater than 60% disease.  Renal artery ultrasound June 2010 showing greater than 60% right and left renal artery stenosis. Carotid ultrasound June 2010 showed 50-60% stenosis of the right and left internal carotid artery.  EKG showing normal sinus rhythm with a rate of 56 beats minute, T-wave abnormality noted in leads V1, V2   Outpatient Encounter Prescriptions as of 09/25/2011  Medication Sig Dispense Refill  . amLODipine (NORVASC) 5 MG tablet Take 5 mg by mouth daily.        Marland Kitchen aspirin 81 MG tablet Take 2 tablets (162 mg total) by mouth daily.  30 tablet  0  . furosemide (LASIX) 40 MG tablet Take 40 mg by mouth daily.       Marland Kitchen glipiZIDE (GLUCOTROL) 10 MG tablet Take 5 mg by mouth 1 day or 1 dose.        . meloxicam (MOBIC) 7.5 MG tablet Take 7.5 mg by mouth daily.        . metFORMIN (GLUCOPHAGE) 500 MG tablet Take 500 mg by mouth 2 (two) times daily  with a meal.        . metoprolol (LOPRESSOR) 50 MG tablet TAKE ONE TABLET BY MOUTH TWICE DAILY (NEED OFFICE VISIT)  60 tablet  6  . nitroGLYCERIN (NITROSTAT) 0.4 MG SL tablet Place 0.4 mg under the tongue every 5 (five) minutes as needed.        . simvastatin (ZOCOR) 20 MG tablet TAKE ONE TABLET BY MOUTH AT BEDTIME  30 tablet  6   Plavix 75 One a day    . ZETIA 10 MG tablet TAKE ONE TABLET BY MOUTH EVERY DAY  30 each  6     Review of Systems  Constitutional: Negative.   HENT: Negative.   Eyes: Negative.   Respiratory: Positive for shortness of breath.   Gastrointestinal: Negative.   Musculoskeletal: Positive for gait problem.  Skin: Negative.   Neurological: Negative.   Hematological: Negative.   Psychiatric/Behavioral: Negative.   All other systems reviewed and are negative.   BP 110/58  Pulse 57  Ht 5\' 4"  (1.626 m)  Wt 151 lb (68.493 kg)  BMI 25.92 kg/m2   Physical Exam  Nursing note and vitals reviewed. Constitutional: She is oriented to person, place, and time. She appears well-developed and well-nourished.  HENT:  Head: Normocephalic.  Nose: Nose normal.  Mouth/Throat: Oropharynx is clear and moist.  Eyes: Conjunctivae are normal. Pupils are equal,  round, and reactive to light.  Neck: Normal range of motion. Neck supple. No JVD present.  Cardiovascular: Normal rate, regular rhythm, S1 normal, S2 normal and intact distal pulses.  Exam reveals no gallop and no friction rub.   Murmur heard.  Crescendo systolic murmur is present with a grade of 2/6  Pulmonary/Chest: Effort normal. No respiratory distress. She has no wheezes. She has no rales. She exhibits no tenderness.       Moderately decreased breath sounds bilaterally throughout.  Abdominal: Soft. Bowel sounds are normal. She exhibits no distension. There is no tenderness.  Musculoskeletal: Normal range of motion. She exhibits no edema and no tenderness.  Lymphadenopathy:    She has no cervical adenopathy.    Neurological: She is alert and oriented to person, place, and time. Coordination normal.  Skin: Skin is warm and dry. No rash noted. No erythema.  Psychiatric: She has a normal mood and affect. Her behavior is normal. Judgment and thought content normal.         Assessment and Plan

## 2011-09-25 NOTE — Assessment & Plan Note (Addendum)
Currently with no symptoms of angina. No further workup at this time. She is currently pain $100 for Plavix. She is unable to tolerate the generic equivalent. As she has been stable, we will hold her Plavix when her prescription is complete this month and change her to aspirin 81 mg x2.

## 2011-09-25 NOTE — Patient Instructions (Signed)
You are doing well. Take aspirin 81 daily with plavix every other day When you run out of plavix, stop the plavix and start aspirin 81 x 2 a day  Please call us if you have new issues that need to be addressed before your next appt.  We will call you for a follow up Appt. In 6 months

## 2011-09-25 NOTE — Assessment & Plan Note (Signed)
She is scheduled for repeat ultrasound of her carotid and lower extremities. No claudication-type symptoms.

## 2011-09-25 NOTE — Assessment & Plan Note (Signed)
We have encouraged continued exercise, careful diet management in an effort to lose weight. 

## 2011-09-26 LAB — POCT I-STAT 4, (NA,K, GLUC, HGB,HCT)
Glucose, Bld: 101 — ABNORMAL HIGH
Glucose, Bld: 158 — ABNORMAL HIGH
Glucose, Bld: 90
HCT: 20 — ABNORMAL LOW
HCT: 22 — ABNORMAL LOW
HCT: 32 — ABNORMAL LOW
Hemoglobin: 10.9 — ABNORMAL LOW
Hemoglobin: 6.8 — CL
Hemoglobin: 6.8 — CL
Hemoglobin: 7.5 — CL
Operator id: 128731
Operator id: 3406
Potassium: 4.2
Potassium: 4.4
Potassium: 4.5
Sodium: 130 — ABNORMAL LOW
Sodium: 134 — ABNORMAL LOW
Sodium: 135

## 2011-09-26 LAB — BASIC METABOLIC PANEL
BUN: 16
BUN: 18
CO2: 28
Chloride: 104
Chloride: 107
Chloride: 112
GFR calc Af Amer: 50 — ABNORMAL LOW
GFR calc non Af Amer: 37 — ABNORMAL LOW
GFR calc non Af Amer: 37 — ABNORMAL LOW
GFR calc non Af Amer: 38 — ABNORMAL LOW
GFR calc non Af Amer: 42 — ABNORMAL LOW
Glucose, Bld: 105 — ABNORMAL HIGH
Glucose, Bld: 131 — ABNORMAL HIGH
Glucose, Bld: 132 — ABNORMAL HIGH
Potassium: 3.8
Potassium: 4.4
Potassium: 4.4
Potassium: 4.5
Sodium: 139
Sodium: 139

## 2011-09-26 LAB — POCT I-STAT 3, ART BLOOD GAS (G3+)
Acid-base deficit: 6 — ABNORMAL HIGH
Bicarbonate: 16.9 — ABNORMAL LOW
Bicarbonate: 17.4 — ABNORMAL LOW
O2 Saturation: 100
Operator id: 128731
Operator id: 285671
Operator id: 285671
Patient temperature: 35.1
TCO2: 18
TCO2: 21
pCO2 arterial: 33.5 — ABNORMAL LOW
pCO2 arterial: 36.1
pH, Arterial: 7.287 — ABNORMAL LOW
pH, Arterial: 7.353
pO2, Arterial: 103 — ABNORMAL HIGH
pO2, Arterial: 81

## 2011-09-26 LAB — I-STAT EC8
Acid-base deficit: 8 — ABNORMAL HIGH
Bicarbonate: 18 — ABNORMAL LOW
Chloride: 108
HCT: 40
Operator id: 157281
pCO2 arterial: 37.9
pH, Arterial: 7.285 — ABNORMAL LOW

## 2011-09-26 LAB — CBC
HCT: 30.1 — ABNORMAL LOW
HCT: 31.7 — ABNORMAL LOW
HCT: 32.9 — ABNORMAL LOW
Hemoglobin: 10.4 — ABNORMAL LOW
Hemoglobin: 11.3 — ABNORMAL LOW
MCHC: 33.9
MCV: 88.7
MCV: 89.5
MCV: 89.6
Platelets: 105 — ABNORMAL LOW
Platelets: 136 — ABNORMAL LOW
Platelets: 96 — ABNORMAL LOW
RBC: 3.71 — ABNORMAL LOW
RBC: 4.46
RDW: 13.6
RDW: 14.8 — ABNORMAL HIGH
WBC: 13.7 — ABNORMAL HIGH
WBC: 18.8 — ABNORMAL HIGH
WBC: 23.8 — ABNORMAL HIGH

## 2011-09-26 LAB — POCT I-STAT GLUCOSE: Glucose, Bld: 147 — ABNORMAL HIGH

## 2011-09-26 LAB — PROTIME-INR: Prothrombin Time: 18.5 — ABNORMAL HIGH

## 2011-09-26 LAB — CREATININE, SERUM
Creatinine, Ser: 1.11
GFR calc Af Amer: 57 — ABNORMAL LOW

## 2011-09-26 LAB — HEMOGLOBIN AND HEMATOCRIT, BLOOD
HCT: 22.3 — ABNORMAL LOW
Hemoglobin: 7.7 — CL

## 2011-09-26 LAB — MAGNESIUM: Magnesium: 3.1 — ABNORMAL HIGH

## 2011-09-27 LAB — URINE MICROSCOPIC-ADD ON

## 2011-09-27 LAB — BASIC METABOLIC PANEL
BUN: 15
BUN: 22
CO2: 24
Chloride: 104
Chloride: 106
Chloride: 99
GFR calc Af Amer: 38 — ABNORMAL LOW
GFR calc Af Amer: 48 — ABNORMAL LOW
GFR calc non Af Amer: 29 — ABNORMAL LOW
GFR calc non Af Amer: 41 — ABNORMAL LOW
Glucose, Bld: 122 — ABNORMAL HIGH
Glucose, Bld: 151 — ABNORMAL HIGH
Potassium: 4.5
Potassium: 4.6
Potassium: 4.8
Sodium: 129 — ABNORMAL LOW
Sodium: 133 — ABNORMAL LOW
Sodium: 134 — ABNORMAL LOW

## 2011-09-27 LAB — TYPE AND SCREEN

## 2011-09-27 LAB — RETICULOCYTES: Retic Count, Absolute: 40.3

## 2011-09-27 LAB — CBC
HCT: 24.6 — ABNORMAL LOW
HCT: 25.4 — ABNORMAL LOW
HCT: 26.5 — ABNORMAL LOW
HCT: 30.7 — ABNORMAL LOW
Hemoglobin: 10.2 — ABNORMAL LOW
Hemoglobin: 8.3 — ABNORMAL LOW
Hemoglobin: 8.6 — ABNORMAL LOW
MCV: 88.6
MCV: 89.5
Platelets: 212
Platelets: 213
Platelets: 255
WBC: 11.3 — ABNORMAL HIGH
WBC: 7.5
WBC: 8.2
WBC: 8.6

## 2011-09-27 LAB — URINALYSIS, ROUTINE W REFLEX MICROSCOPIC
Ketones, ur: NEGATIVE
Nitrite: NEGATIVE
Urobilinogen, UA: 0.2
pH: 7

## 2011-09-27 LAB — COMPREHENSIVE METABOLIC PANEL WITH GFR
ALT: 13
AST: 20
Albumin: 3.4 — ABNORMAL LOW
Alkaline Phosphatase: 65
BUN: 22
CO2: 27
Calcium: 9.1
Chloride: 97
Creatinine, Ser: 1.45 — ABNORMAL HIGH
GFR calc non Af Amer: 35 — ABNORMAL LOW
Glucose, Bld: 343 — ABNORMAL HIGH
Potassium: 3.6
Sodium: 134 — ABNORMAL LOW
Total Bilirubin: 0.6
Total Protein: 5.9 — ABNORMAL LOW

## 2011-09-27 LAB — BASIC METABOLIC PANEL WITH GFR
BUN: 19
CO2: 28
Calcium: 8.6
Chloride: 99
Creatinine, Ser: 1.18
GFR calc non Af Amer: 44 — ABNORMAL LOW
Glucose, Bld: 86
Potassium: 3.5
Sodium: 135

## 2011-09-27 LAB — TSH: TSH: 3.426

## 2011-09-27 LAB — BLOOD GAS, ARTERIAL
Bicarbonate: 22.5
FIO2: 0.21
pCO2 arterial: 33.7 — ABNORMAL LOW
pH, Arterial: 7.44 — ABNORMAL HIGH
pO2, Arterial: 87.7

## 2011-09-27 LAB — IRON AND TIBC
Saturation Ratios: 16 — ABNORMAL LOW
UIBC: 318

## 2011-09-27 LAB — CARDIAC PANEL(CRET KIN+CKTOT+MB+TROPI)
CK, MB: 2.1
CK, MB: 2.3
Troponin I: 0.02

## 2011-09-27 LAB — APTT: aPTT: 30

## 2011-09-27 LAB — PROTIME-INR: INR: 1

## 2011-09-27 LAB — VITAMIN B12: Vitamin B-12: 502 (ref 211–911)

## 2011-09-27 LAB — FOLATE RBC: RBC Folate: 1429 — ABNORMAL HIGH

## 2011-09-27 LAB — MAGNESIUM: Magnesium: 2.2

## 2011-10-07 ENCOUNTER — Other Ambulatory Visit: Payer: Self-pay | Admitting: Cardiology

## 2011-10-07 DIAGNOSIS — I6529 Occlusion and stenosis of unspecified carotid artery: Secondary | ICD-10-CM

## 2011-10-08 ENCOUNTER — Encounter: Payer: PRIVATE HEALTH INSURANCE | Admitting: *Deleted

## 2011-10-22 ENCOUNTER — Encounter (INDEPENDENT_AMBULATORY_CARE_PROVIDER_SITE_OTHER): Payer: Medicare Other | Admitting: *Deleted

## 2011-10-22 DIAGNOSIS — I6529 Occlusion and stenosis of unspecified carotid artery: Secondary | ICD-10-CM

## 2011-10-24 ENCOUNTER — Ambulatory Visit: Payer: PRIVATE HEALTH INSURANCE

## 2011-10-28 ENCOUNTER — Encounter: Payer: Self-pay | Admitting: Cardiovascular Disease

## 2011-11-05 ENCOUNTER — Other Ambulatory Visit: Payer: Self-pay | Admitting: Cardiovascular Disease

## 2011-11-05 DIAGNOSIS — I10 Essential (primary) hypertension: Secondary | ICD-10-CM

## 2011-11-05 DIAGNOSIS — I701 Atherosclerosis of renal artery: Secondary | ICD-10-CM

## 2011-11-14 ENCOUNTER — Other Ambulatory Visit: Payer: Self-pay | Admitting: Cardiovascular Disease

## 2011-11-14 NOTE — Telephone Encounter (Signed)
Refill sent for simvastatin.  

## 2011-12-03 ENCOUNTER — Encounter: Payer: Medicare Other | Admitting: *Deleted

## 2011-12-03 ENCOUNTER — Other Ambulatory Visit: Payer: Self-pay | Admitting: Cardiology

## 2011-12-03 ENCOUNTER — Encounter (INDEPENDENT_AMBULATORY_CARE_PROVIDER_SITE_OTHER): Payer: Medicare Other | Admitting: *Deleted

## 2011-12-03 DIAGNOSIS — I700 Atherosclerosis of aorta: Secondary | ICD-10-CM

## 2011-12-03 DIAGNOSIS — I739 Peripheral vascular disease, unspecified: Secondary | ICD-10-CM

## 2011-12-12 ENCOUNTER — Encounter: Payer: Self-pay | Admitting: *Deleted

## 2011-12-19 ENCOUNTER — Telehealth: Payer: Self-pay

## 2011-12-19 MED ORDER — EZETIMIBE 10 MG PO TABS: 10.0000 mg | ORAL_TABLET | Freq: Every day | ORAL | Status: DC

## 2011-12-19 NOTE — Telephone Encounter (Signed)
Refill for zetia

## 2011-12-24 ENCOUNTER — Other Ambulatory Visit: Payer: Self-pay | Admitting: Cardiovascular Disease

## 2011-12-24 DIAGNOSIS — I701 Atherosclerosis of renal artery: Secondary | ICD-10-CM

## 2011-12-24 DIAGNOSIS — I10 Essential (primary) hypertension: Secondary | ICD-10-CM

## 2011-12-24 DIAGNOSIS — I700 Atherosclerosis of aorta: Secondary | ICD-10-CM

## 2011-12-24 DIAGNOSIS — I739 Peripheral vascular disease, unspecified: Secondary | ICD-10-CM

## 2012-01-24 ENCOUNTER — Other Ambulatory Visit: Payer: Self-pay | Admitting: Cardiovascular Disease

## 2012-03-17 ENCOUNTER — Ambulatory Visit (INDEPENDENT_AMBULATORY_CARE_PROVIDER_SITE_OTHER): Payer: Medicare Other | Admitting: Cardiovascular Disease

## 2012-03-17 ENCOUNTER — Encounter: Payer: Self-pay | Admitting: Cardiovascular Disease

## 2012-03-17 VITALS — BP 129/76 | HR 54 | Ht 64.0 in | Wt 150.0 lb

## 2012-03-17 DIAGNOSIS — R079 Chest pain, unspecified: Secondary | ICD-10-CM

## 2012-03-17 DIAGNOSIS — E785 Hyperlipidemia, unspecified: Secondary | ICD-10-CM

## 2012-03-17 DIAGNOSIS — I25810 Atherosclerosis of coronary artery bypass graft(s) without angina pectoris: Secondary | ICD-10-CM

## 2012-03-17 DIAGNOSIS — I10 Essential (primary) hypertension: Secondary | ICD-10-CM

## 2012-03-17 DIAGNOSIS — I739 Peripheral vascular disease, unspecified: Secondary | ICD-10-CM

## 2012-03-17 DIAGNOSIS — E119 Type 2 diabetes mellitus without complications: Secondary | ICD-10-CM

## 2012-03-17 NOTE — Assessment & Plan Note (Signed)
Cholesterol is at goal on the current lipid regimen. No changes to the medications were made.  

## 2012-03-17 NOTE — Assessment & Plan Note (Signed)
Blood pressure is well controlled on today's visit. No changes made to the medications. 

## 2012-03-17 NOTE — Progress Notes (Signed)
Patient ID: Donna Lane, female    DOB: 05/07/1925, 78 y.o.   MRN: [id number]  HPI Comments: Donna Lane is a pleasant 78 year old woman with history of coronary artery disease, catheterization August 25, 2007 showing severe three-vessel disease, with bypass surgery at [facility] September 2008 also with severe peripheral vascular disease, bilateral renal artery stenosis, carotid disease, iliac disease who presents for Office visit for routine visit.   previous episodes of chest pain  resolved by decreasing the dose of her aspirin from 325 mg daily to 81 mg daily.  She now has pain in the subxiphoid region with occasional nausea and vomiting of green bilious liquid. She attributes her discomfort to taking meloxicam which was initiated for possible gout. She denies that she has gout and wonders why she is still on the medication. Pain seems to come on after eating. She denies any significant pain with activity or playing golf. In fact she is planning several tournaments in the next few weeks.   Carotid ultrasound in the past several months has shown 40-59% bilateral carotid disease.  LE u/s 11/2011 suggest  50% distal aorta disease, right common iliac artery with equal or less than 50% disease, left common iliac with equal or greater than 60% disease. 6 no severe renal artery stenosis  EKG showing normal sinus rhythm with a rate of 49 beats minute, T-wave abnormality noted in leads V1, V2   Outpatient Encounter Prescriptions as of 76/01/2012  Medication Sig Dispense Refill  . amLODipine (NORVASC) 5 MG tablet Take 5 mg by mouth daily.        Marland Kitchen aspirin 81 MG tablet Take 2 tablets (162 mg total) by mouth daily.  30 tablet  0  . ezetimibe (ZETIA) 10 MG tablet Take 1 tablet (10 mg total) by mouth daily.  30 tablet  6  . furosemide (LASIX) 40 MG tablet Take 40 mg by mouth daily.       Marland Kitchen glipiZIDE (GLUCOTROL) 10 MG tablet Take 5 mg by mouth 1 day or 1 dose.        . isosorbide  mononitrate (IMDUR) 60 MG 24 hr tablet TAKE ONE half TABLET BY MOUTH EVERY DAY      . meloxicam (MOBIC) 7.5 MG tablet Take 7.5 mg by mouth daily.        . metFORMIN (GLUCOPHAGE) 500 MG tablet Take 500 mg by mouth 2 (two) times daily with a meal.        . metoprolol (LOPRESSOR) 50 MG tablet TAKE ONE TABLET BY MOUTH TWICE DAILY (NEED OFFICE VISIT)  60 tablet  6  . nitroGLYCERIN (NITROSTAT) 0.4 MG SL tablet Place 0.4 mg under the tongue every 5 (five) minutes as needed.        . polyethylene glycol (MIRALAX / GLYCOLAX) packet Take 17 g by mouth once a week.      . simvastatin (ZOCOR) 20 MG tablet TAKE ONE TABLET BY MOUTH AT BEDTIME  30 tablet  6    Review of Systems  Constitutional: Negative.   HENT: Negative.   Eyes: Negative.   Cardiovascular: Positive for chest pain.  Gastrointestinal: Negative.   Musculoskeletal: Positive for gait problem.  Skin: Negative.   Neurological: Negative.   Hematological: Negative.   Psychiatric/Behavioral: Negative.   All other systems reviewed and are negative.   BP 129/76  Pulse 54  Ht 5\' 4"  (1.626 m)  Wt 150 lb (68.04 kg)  BMI 25.75 kg/m2  SpO2 95%   Physical Exam  Nursing note and vitals reviewed. Constitutional: She is oriented to person, place, and time. She appears well-developed and well-nourished.  HENT:  Head: Normocephalic.  Nose: Nose normal.  Mouth/Throat: Oropharynx is clear and moist.  Eyes: Conjunctivae are normal. Pupils are equal, round, and reactive to light.  Neck: Normal range of motion. Neck supple. No JVD present.  Cardiovascular: Normal rate, regular rhythm, S1 normal, S2 normal and intact distal pulses.  Exam reveals no gallop and no friction rub.   Murmur heard.  Crescendo systolic murmur is present with a grade of 2/6  Pulmonary/Chest: Effort normal. No respiratory distress. She has no wheezes. She has no rales. She exhibits no tenderness.       Moderately decreased breath sounds bilaterally throughout.  Abdominal:  Soft. Bowel sounds are normal. She exhibits no distension. There is no tenderness.  Musculoskeletal: Normal range of motion. She exhibits no edema and no tenderness.  Lymphadenopathy:    She has no cervical adenopathy.  Neurological: She is alert and oriented to person, place, and time. Coordination normal.  Skin: Skin is warm and dry. No rash noted. No erythema.  Psychiatric: She has a normal mood and affect. Her behavior is normal. Judgment and thought content normal.         Assessment and Plan

## 2012-03-17 NOTE — Patient Instructions (Addendum)
You are doing well. Please try omeprazole one or two a day for stomach pain Stop the meloxican  Please call us if you have new issues that need to be addressed before your next appt.  Your physician wants you to follow-up in: 6 months.  You will receive a reminder letter in the mail two months in advance. If you don't receive a letter, please call our office to schedule the follow-up appointment.

## 2012-03-17 NOTE — Assessment & Plan Note (Signed)
Stable carotid and lower extremity arterial disease. Continue aggressive cholesterol management.

## 2012-03-17 NOTE — Assessment & Plan Note (Signed)
Chest pain is likely secondary to GERD. We have suggested she start omeprazole 20 mg daily. We have also suggested she hold her meloxicam for now. If she continues to have chest pain with no relief on these medication changes, we have suggested she contact our office for further evaluation.

## 2012-03-17 NOTE — Assessment & Plan Note (Signed)
Currently with no symptoms of angina. No further workup at this time. Continue current medication regimen. 

## 2012-03-17 NOTE — Assessment & Plan Note (Signed)
We have suggested she closely monitor her foods, continue her exercise.

## 2012-05-30 ENCOUNTER — Other Ambulatory Visit: Payer: Self-pay | Admitting: Cardiovascular Disease

## 2012-06-01 ENCOUNTER — Other Ambulatory Visit: Payer: Self-pay | Admitting: *Deleted

## 2012-06-01 MED ORDER — METOPROLOL TARTRATE 50 MG PO TABS: 50.0000 mg | ORAL_TABLET | Freq: Two times a day (BID) | ORAL | Status: DC

## 2012-06-01 NOTE — Telephone Encounter (Signed)
Refilled Metoprolol

## 2012-06-16 ENCOUNTER — Other Ambulatory Visit: Payer: Self-pay

## 2012-06-16 ENCOUNTER — Other Ambulatory Visit: Payer: Self-pay | Admitting: Cardiovascular Disease

## 2012-06-16 MED ORDER — SIMVASTATIN 20 MG PO TABS: 20.0000 mg | ORAL_TABLET | Freq: Every day | ORAL | Status: DC

## 2012-06-16 NOTE — Telephone Encounter (Signed)
Refill sent for simvastatin.  

## 2012-06-27 ENCOUNTER — Inpatient Hospital Stay: Payer: Self-pay | Admitting: Internal Medicine

## 2012-06-27 LAB — BASIC METABOLIC PANEL
Anion Gap: 9 (ref 7–16)
BUN: 16 mg/dL (ref 7–18)
Calcium, Total: 9.7 mg/dL (ref 8.5–10.1)
Chloride: 101 mmol/L (ref 98–107)
Creatinine: 1.3 mg/dL (ref 0.60–1.30)
EGFR (African American): 44 — ABNORMAL LOW
EGFR (Non-African Amer.): 38 — ABNORMAL LOW
Glucose: 98 mg/dL (ref 65–99)
Osmolality: 279 (ref 275–301)

## 2012-06-27 LAB — CBC
HCT: 45.6 % (ref 35.0–47.0)
HGB: 14.9 g/dL (ref 12.0–16.0)
MCV: 91 fL (ref 80–100)
RDW: 13.9 % (ref 11.5–14.5)

## 2012-06-27 LAB — CK TOTAL AND CKMB (NOT AT ARMC): CK, Total: 54 U/L (ref 21–215)

## 2012-06-27 LAB — LIPASE, BLOOD: Lipase: 234 U/L (ref 73–393)

## 2012-06-28 DIAGNOSIS — I059 Rheumatic mitral valve disease, unspecified: Secondary | ICD-10-CM

## 2012-06-28 DIAGNOSIS — R079 Chest pain, unspecified: Secondary | ICD-10-CM

## 2012-06-28 LAB — TROPONIN I
Troponin-I: <0.02 ng/mL
Troponin-I: <0.02 ng/mL

## 2012-06-28 LAB — CK TOTAL AND CKMB (NOT AT ARMC)
CK, Total: 40 U/L (ref 21–215)
CK-MB: 1.2 ng/mL (ref 0.5–3.6)

## 2012-06-28 LAB — LIPID PANEL
Cholesterol: 124 mg/dL (ref 0–200)
Ldl Cholesterol, Calc: 56 mg/dL (ref 0–100)
Triglycerides: 136 mg/dL (ref 0–200)

## 2012-06-29 ENCOUNTER — Telehealth: Payer: Self-pay

## 2012-06-29 ENCOUNTER — Other Ambulatory Visit: Payer: Self-pay

## 2012-06-29 DIAGNOSIS — R079 Chest pain, unspecified: Secondary | ICD-10-CM

## 2012-06-29 DIAGNOSIS — R0602 Shortness of breath: Secondary | ICD-10-CM

## 2012-06-29 NOTE — Telephone Encounter (Signed)
Rec'd message from Dr. Kirke Corin saying pt was in hosp. Over w/e and I needs to schedule pt for outpt Lexiscan myoview.  Will call pt to schedule.

## 2012-06-29 NOTE — Telephone Encounter (Signed)
Scheduled pt for 0800 at [facility] tomm am. Verbal instructions given to pt.  Understanding verb.

## 2012-06-30 ENCOUNTER — Ambulatory Visit: Payer: Self-pay | Admitting: Cardiovascular Disease

## 2012-06-30 DIAGNOSIS — R079 Chest pain, unspecified: Secondary | ICD-10-CM

## 2012-07-01 ENCOUNTER — Telehealth: Payer: Self-pay

## 2012-07-01 NOTE — Telephone Encounter (Signed)
Per Dr. Mariah Milling, [facility] was normal.   Pt informed.

## 2012-07-06 ENCOUNTER — Other Ambulatory Visit: Payer: Self-pay | Admitting: Cardiovascular Disease

## 2012-07-06 DIAGNOSIS — R0602 Shortness of breath: Secondary | ICD-10-CM

## 2012-07-06 DIAGNOSIS — R079 Chest pain, unspecified: Secondary | ICD-10-CM

## 2012-08-18 ENCOUNTER — Other Ambulatory Visit: Payer: Self-pay | Admitting: Cardiovascular Disease

## 2012-08-18 MED ORDER — EZETIMIBE 10 MG PO TABS: 10.0000 mg | ORAL_TABLET | Freq: Every day | ORAL | Status: DC

## 2012-08-18 NOTE — Telephone Encounter (Signed)
Refilled Zetia.

## 2012-10-09 ENCOUNTER — Encounter: Payer: Self-pay | Admitting: Cardiovascular Disease

## 2012-10-09 ENCOUNTER — Ambulatory Visit (INDEPENDENT_AMBULATORY_CARE_PROVIDER_SITE_OTHER): Payer: Medicare Other | Admitting: Cardiovascular Disease

## 2012-10-09 VITALS — BP 120/62 | HR 64 | Ht 63.5 in | Wt 151.5 lb

## 2012-10-09 DIAGNOSIS — I739 Peripheral vascular disease, unspecified: Secondary | ICD-10-CM

## 2012-10-09 DIAGNOSIS — R079 Chest pain, unspecified: Secondary | ICD-10-CM

## 2012-10-09 DIAGNOSIS — I10 Essential (primary) hypertension: Secondary | ICD-10-CM

## 2012-10-09 DIAGNOSIS — I25810 Atherosclerosis of coronary artery bypass graft(s) without angina pectoris: Secondary | ICD-10-CM

## 2012-10-09 DIAGNOSIS — E785 Hyperlipidemia, unspecified: Secondary | ICD-10-CM

## 2012-10-09 NOTE — Patient Instructions (Addendum)
You are doing well. No medication changes were made.  We will schedule you for lower extremity ultrasound, renal ultrasound in November or December  Ask your pharmacist about the cost of vytorin (this is simvastatin and zetia in one pill)  If your stomach keeps hurting, continue on omeprazole one or two a day  Please call us if you have new issues that need to be addressed before your next appt.  Your physician wants you to follow-up in: 6 months.  You will receive a reminder letter in the mail two months in advance. If you don't receive a letter, please call our office to schedule the follow-up appointment.

## 2012-10-09 NOTE — Assessment & Plan Note (Addendum)
We will schedule her for repeat aorta common iliacs, renal Doppler and carotid in the next several months

## 2012-10-09 NOTE — Assessment & Plan Note (Signed)
Currently with no symptoms of angina. No further workup at this time. Continue current medication regimen. 

## 2012-10-09 NOTE — Progress Notes (Signed)
Patient ID: Donna Lane, female    DOB: 06-16-1925, 78 y.o.   MRN: [id number]  HPI Comments: Donna Lane is a pleasant 78 year old woman with history of coronary artery disease, catheterization August 25, 2007 showing severe three-vessel disease, with bypass surgery at [facility] September 2008 also with severe peripheral vascular disease, bilateral renal artery stenosis, carotid disease, iliac disease who presents for Office visit for routine visit.   previous episodes of chest pain  resolved by decreasing the dose of her aspirin from 325 mg daily to 81 mg daily.  Prior pain in the subxiphoid region with occasional nausea and vomiting of green bilious liquid. She attributes her discomfort to taking meloxicam which was initiated for possible gout.   Overall today she reports that she feels well. No significant chest pain symptoms. She is active, continues to play golf  Carotid ultrasound in the November 2012 has shown 40-59% bilateral carotid disease.  LE u/s 11/2011 suggest  50% distal aorta disease, right common iliac artery with equal or less than 50% disease, left common iliac with equal or greater than 60% disease. 6 no severe renal artery stenosis  EKG showing normal sinus rhythm with a rate of 64  beats minute, T-wave abnormality noted in leads V1 - V3  Outpatient Encounter Prescriptions as of 10/09/2012  Medication Sig Dispense Refill  . amLODipine (NORVASC) 5 MG tablet Take 5 mg by mouth daily.        Marland Kitchen aspirin 81 MG tablet Take 2 tablets (162 mg total) by mouth daily.  30 tablet  0  . ezetimibe (ZETIA) 10 MG tablet Take 1 tablet (10 mg total) by mouth daily.  30 tablet  6  . furosemide (LASIX) 40 MG tablet Take 40 mg by mouth daily.       Marland Kitchen glipiZIDE (GLUCOTROL) 10 MG tablet Take 5 mg by mouth 1 day or 1 dose.        . isosorbide mononitrate (IMDUR) 60 MG 24 hr tablet TAKE ONE half TABLET BY MOUTH EVERY DAY      . meloxicam (MOBIC) 7.5 MG tablet Take 7.5 mg by  mouth daily.        . metFORMIN (GLUCOPHAGE) 500 MG tablet Take 500 mg by mouth 2 (two) times daily with a meal.        . metoprolol (LOPRESSOR) 50 MG tablet Take 1 tablet (50 mg total) by mouth 2 (two) times daily.  60 tablet  5  . nitroGLYCERIN (NITROSTAT) 0.4 MG SL tablet Place 0.4 mg under the tongue every 5 (five) minutes as needed.        . polyethylene glycol (MIRALAX / GLYCOLAX) packet Take 17 g by mouth once a week.      . simvastatin (ZOCOR) 20 MG tablet TAKE ONE TABLET BY MOUTH AT BEDTIME  30 tablet  6    Review of Systems  Constitutional: Negative.   HENT: Negative.   Eyes: Negative.   Gastrointestinal: Negative.   Musculoskeletal: Positive for gait problem.  Skin: Negative.   Neurological: Negative.   Hematological: Negative.   Psychiatric/Behavioral: Negative.   All other systems reviewed and are negative.   BP 120/62  Pulse 64  Ht 5' 3.5" (1.613 m)  Wt 151 lb 8 oz (68.72 kg)  BMI 26.42 kg/m2  Physical Exam  Nursing note and vitals reviewed. Constitutional: She is oriented to person, place, and time. She appears well-developed and well-nourished.  HENT:  Head: Normocephalic.  Nose: Nose normal.  Mouth/Throat:  Oropharynx is clear and moist.  Eyes: Conjunctivae normal are normal. Pupils are equal, round, and reactive to light.  Neck: Normal range of motion. Neck supple. No JVD present.  Cardiovascular: Normal rate, regular rhythm, S1 normal, S2 normal and intact distal pulses.  Exam reveals no gallop and no friction rub.   Murmur heard.  Crescendo systolic murmur is present with a grade of 2/6  Pulmonary/Chest: Effort normal. No respiratory distress. She has no wheezes. She has no rales. She exhibits no tenderness.       Mildly decreased breath sounds bilaterally throughout.  Abdominal: Soft. Bowel sounds are normal. She exhibits no distension. There is no tenderness.  Musculoskeletal: Normal range of motion. She exhibits no edema and no tenderness.    Lymphadenopathy:    She has no cervical adenopathy.  Neurological: She is alert and oriented to person, place, and time. Coordination normal.  Skin: Skin is warm and dry. No rash noted. No erythema.  Psychiatric: She has a normal mood and affect. Her behavior is normal. Judgment and thought content normal.         Assessment and Plan

## 2012-10-09 NOTE — Assessment & Plan Note (Signed)
Blood pressure is well controlled on today's visit. No changes made to the medications. 

## 2012-10-09 NOTE — Assessment & Plan Note (Signed)
Cholesterol is at goal on the current lipid regimen. No changes to the medications were made. She will ask her pharmacy the cost of vytorin

## 2012-10-16 ENCOUNTER — Ambulatory Visit: Payer: Medicare Other | Admitting: Cardiovascular Disease

## 2012-10-20 ENCOUNTER — Ambulatory Visit: Payer: Medicare Other | Admitting: Cardiovascular Disease

## 2012-11-26 ENCOUNTER — Telehealth: Payer: Self-pay

## 2012-11-26 ENCOUNTER — Encounter (INDEPENDENT_AMBULATORY_CARE_PROVIDER_SITE_OTHER): Payer: Medicare Other

## 2012-11-26 DIAGNOSIS — I739 Peripheral vascular disease, unspecified: Secondary | ICD-10-CM

## 2012-11-26 DIAGNOSIS — I10 Essential (primary) hypertension: Secondary | ICD-10-CM

## 2012-11-26 DIAGNOSIS — I701 Atherosclerosis of renal artery: Secondary | ICD-10-CM

## 2012-11-26 NOTE — Telephone Encounter (Signed)
Patient thinks the Vytorin 10/20 one tablet daily is causing her not to sleep well at night. Please advise if she should go back to the simvastatin & zetia. The patient is okay with taking both the simvastatin and zetia if you think that the vytorin causing her not to sleep well at night. She also will call back to let us know how much the vytorin will cost her.

## 2012-11-30 ENCOUNTER — Other Ambulatory Visit: Payer: Self-pay | Admitting: *Deleted

## 2012-11-30 MED ORDER — METOPROLOL TARTRATE 50 MG PO TABS: 50.0000 mg | ORAL_TABLET | Freq: Two times a day (BID) | ORAL | Status: DC

## 2012-11-30 NOTE — Telephone Encounter (Signed)
Does she want to try the Vytorin in the morning instead of the evening?

## 2012-11-30 NOTE — Telephone Encounter (Signed)
FYI:The patient is going to take the simvastatin and zetia instead of Vytorin. She did not want to take the Vytorin. The patient feels the is the best way for her and that this is how she is going to do it.

## 2012-11-30 NOTE — Telephone Encounter (Signed)
Refilled Metoprolol

## 2012-11-30 NOTE — Telephone Encounter (Signed)
Please review note

## 2012-12-03 ENCOUNTER — Encounter (INDEPENDENT_AMBULATORY_CARE_PROVIDER_SITE_OTHER): Payer: Medicare Other

## 2012-12-03 DIAGNOSIS — I739 Peripheral vascular disease, unspecified: Secondary | ICD-10-CM

## 2012-12-29 ENCOUNTER — Other Ambulatory Visit: Payer: Self-pay

## 2012-12-29 MED ORDER — EZETIMIBE 10 MG PO TABS: 10.0000 mg | ORAL_TABLET | Freq: Every day | ORAL | Status: DC

## 2013-02-21 ENCOUNTER — Other Ambulatory Visit: Payer: Self-pay | Admitting: Cardiovascular Disease

## 2013-03-15 ENCOUNTER — Other Ambulatory Visit: Payer: Self-pay | Admitting: Cardiovascular Disease

## 2013-04-14 ENCOUNTER — Ambulatory Visit (INDEPENDENT_AMBULATORY_CARE_PROVIDER_SITE_OTHER): Payer: Medicare Other | Admitting: Cardiovascular Disease

## 2013-04-14 ENCOUNTER — Encounter: Payer: Self-pay | Admitting: Cardiovascular Disease

## 2013-04-14 VITALS — BP 122/62 | HR 58 | Ht 64.0 in | Wt 149.5 lb

## 2013-04-14 DIAGNOSIS — I25810 Atherosclerosis of coronary artery bypass graft(s) without angina pectoris: Secondary | ICD-10-CM

## 2013-04-14 DIAGNOSIS — I10 Essential (primary) hypertension: Secondary | ICD-10-CM

## 2013-04-14 DIAGNOSIS — E785 Hyperlipidemia, unspecified: Secondary | ICD-10-CM

## 2013-04-14 DIAGNOSIS — I5030 Unspecified diastolic (congestive) heart failure: Secondary | ICD-10-CM

## 2013-04-14 DIAGNOSIS — I509 Heart failure, unspecified: Secondary | ICD-10-CM

## 2013-04-14 NOTE — Progress Notes (Signed)
Patient ID: Donna Lane, female    DOB: [birth year], 79 y.o.   MRN: [id number]  HPI Comments: Donna Lane is a pleasant 79 year old woman with history of coronary artery disease, catheterization August 25, 2007 showing severe three-vessel disease, with bypass surgery at [facility] September 2008 also with severe peripheral vascular disease, bilateral renal artery stenosis, carotid disease, iliac disease who presents  for routine visit.   previous episodes of chest pain  resolved by decreasing the dose of her aspirin from 325 mg daily to 81 mg daily.  Prior pain in the subxiphoid region with occasional nausea and vomiting of green bilious liquid. She attributes her discomfort to taking meloxicam which was initiated for possible gout.   Overall today she reports that she feels well. No significant chest pain symptoms. She is active, continues to play golf Seen recently by Dr. Vear Clock, started on Lasix 40 mg twice a day for weight gain, edema, shortness of breath. Symptoms have improved with Lasix. Now with no edema except for small edema in her left foot.  Carotid ultrasound in the November 2012 has shown 40-59% bilateral carotid disease.  LE u/s 11/2011 suggest  50% distal aorta disease, right common iliac artery with equal or less than 50% disease, left common iliac with equal or greater than 60% disease. no severe renal artery stenosis  EKG showing normal sinus rhythm with a rate of 58  beats minute, no significant ST or T wave changes  Outpatient Encounter Prescriptions as of 04/14/2013  Medication Sig Dispense Refill  . amLODipine (NORVASC) 5 MG tablet Take 5 mg by mouth daily.        Marland Kitchen aspirin 81 MG tablet Take 2 tablets (162 mg total) by mouth daily.  30 tablet  0  . ezetimibe (ZETIA) 10 MG tablet Take 1 tablet (10 mg total) by mouth daily.  28 tablet  0  . furosemide (LASIX) 40 MG tablet Take 40 mg by mouth daily.       Marland Kitchen glipiZIDE (GLUCOTROL) 10 MG tablet Take 5 mg  by mouth 1 day or 1 dose.        . isosorbide mononitrate (IMDUR) 60 MG 24 hr tablet TAKE ONE-HALF TABLET BY MOUTH EVERY DAY  30 tablet  6  . meloxicam (MOBIC) 7.5 MG tablet Take 7.5 mg by mouth daily.        . metFORMIN (GLUCOPHAGE) 500 MG tablet Take 500 mg by mouth 2 (two) times daily with a meal.        . metoprolol (LOPRESSOR) 50 MG tablet Take 1 tablet (50 mg total) by mouth 2 (two) times daily.  60 tablet  5  . nitroGLYCERIN (NITROSTAT) 0.4 MG SL tablet Place 0.4 mg under the tongue every 5 (five) minutes as needed.        . polyethylene glycol (MIRALAX / GLYCOLAX) packet Take 17 g by mouth once a week.      . simvastatin (ZOCOR) 20 MG tablet TAKE ONE TABLET BY MOUTH EVERY DAY  30 tablet  6   No facility-administered encounter medications on file as of 04/14/2013.    Review of Systems  Constitutional: Negative.   HENT: Negative.   Eyes: Negative.   Respiratory: Positive for shortness of breath.   Cardiovascular: Positive for leg swelling.  Gastrointestinal: Negative.   Musculoskeletal: Positive for gait problem.  Skin: Negative.   Neurological: Negative.   Psychiatric/Behavioral: Negative.   All other systems reviewed and are negative.   BP 122/62  Pulse 58  Ht 5\' 4"  (1.626 m)  Wt 149 lb 8 oz (67.813 kg)  BMI 25.65 kg/m2  Physical Exam  Nursing note and vitals reviewed. Constitutional: She is oriented to person, place, and time. She appears well-developed and well-nourished.  HENT:  Head: Normocephalic.  Nose: Nose normal.  Mouth/Throat: Oropharynx is clear and moist.  Eyes: Conjunctivae are normal. Pupils are equal, round, and reactive to light.  Neck: Normal range of motion. Neck supple. No JVD present.  Cardiovascular: Normal rate, regular rhythm, S1 normal, S2 normal and intact distal pulses.  Exam reveals no gallop and no friction rub.   Murmur heard.  Crescendo systolic murmur is present with a grade of 2/6  Pulmonary/Chest: Effort normal and breath sounds  normal. No respiratory distress. She has no wheezes. She has no rales. She exhibits no tenderness.  Mildly decreased breath sounds bilaterally throughout.  Abdominal: Soft. Bowel sounds are normal. She exhibits no distension. There is no tenderness.  Musculoskeletal: Normal range of motion. She exhibits no edema and no tenderness.  Lymphadenopathy:    She has no cervical adenopathy.  Neurological: She is alert and oriented to person, place, and time. Coordination normal.  Skin: Skin is warm and dry. No rash noted. No erythema.  Psychiatric: She has a normal mood and affect. Her behavior is normal. Judgment and thought content normal.    Assessment and Plan

## 2013-04-14 NOTE — Assessment & Plan Note (Signed)
Cholesterol is at goal on the current lipid regimen. No changes to the medications were made.  

## 2013-04-14 NOTE — Assessment & Plan Note (Signed)
Blood pressure is well controlled on today's visit. No changes made to the medications. 

## 2013-04-14 NOTE — Patient Instructions (Addendum)
You are doing well. No medication changes were made. Hold your afternoon lasix if no shortness of breath or leg swelling  Please call us if you have new issues that need to be addressed before your next appt.  Your physician wants you to follow-up in: 6 months.  You will receive a reminder letter in the mail two months in advance. If you don't receive a letter, please call our office to schedule the follow-up appointment.

## 2013-04-14 NOTE — Assessment & Plan Note (Signed)
Currently with no symptoms of angina. No further workup at this time. Continue current medication regimen. 

## 2013-04-14 NOTE — Assessment & Plan Note (Signed)
Recent improvement in edema and shortness of breath with Lasix. We have suggested she continue Lasix at least one a day.

## 2013-05-25 ENCOUNTER — Other Ambulatory Visit: Payer: Self-pay | Admitting: Cardiovascular Disease

## 2013-05-26 ENCOUNTER — Other Ambulatory Visit: Payer: Self-pay | Admitting: *Deleted

## 2013-05-26 MED ORDER — METOPROLOL TARTRATE 50 MG PO TABS: 50.0000 mg | ORAL_TABLET | Freq: Two times a day (BID) | ORAL | Status: DC

## 2013-05-26 NOTE — Telephone Encounter (Signed)
Refilled Metoprolol sent to [facility] pharmacy.

## 2013-09-20 ENCOUNTER — Other Ambulatory Visit: Payer: Self-pay | Admitting: Cardiovascular Disease

## 2013-10-12 ENCOUNTER — Encounter: Payer: Self-pay | Admitting: Cardiovascular Disease

## 2013-10-12 ENCOUNTER — Ambulatory Visit (INDEPENDENT_AMBULATORY_CARE_PROVIDER_SITE_OTHER): Payer: Medicare Other | Admitting: Cardiovascular Disease

## 2013-10-12 VITALS — BP 124/70 | HR 59 | Ht 64.0 in | Wt 144.8 lb

## 2013-10-12 DIAGNOSIS — I509 Heart failure, unspecified: Secondary | ICD-10-CM

## 2013-10-12 DIAGNOSIS — R0789 Other chest pain: Secondary | ICD-10-CM

## 2013-10-12 DIAGNOSIS — R079 Chest pain, unspecified: Secondary | ICD-10-CM

## 2013-10-12 DIAGNOSIS — E785 Hyperlipidemia, unspecified: Secondary | ICD-10-CM

## 2013-10-12 DIAGNOSIS — I5033 Acute on chronic diastolic (congestive) heart failure: Secondary | ICD-10-CM | POA: Insufficient documentation

## 2013-10-12 DIAGNOSIS — I10 Essential (primary) hypertension: Secondary | ICD-10-CM

## 2013-10-12 DIAGNOSIS — I5032 Chronic diastolic (congestive) heart failure: Secondary | ICD-10-CM

## 2013-10-12 DIAGNOSIS — I5030 Unspecified diastolic (congestive) heart failure: Secondary | ICD-10-CM

## 2013-10-12 MED ORDER — ALBUTEROL SULFATE HFA 108 (90 BASE) MCG/ACT IN AERS: 2.0000 | INHALATION_SPRAY | Freq: Four times a day (QID) | RESPIRATORY_TRACT | Status: DC | PRN

## 2013-10-12 NOTE — Assessment & Plan Note (Signed)
Appears to be doing well on Lasix twice a day. She does drink significant volume of coffee daily.

## 2013-10-12 NOTE — Assessment & Plan Note (Signed)
Blood pressure is well controlled on today's visit. No changes made to the medications. 

## 2013-10-12 NOTE — Assessment & Plan Note (Signed)
Etiology of her tightness in the chest after lunch in the afternoon is unclear. Suggested she not wear a bra for a period of time to see if this helps. We have given her a albuterol inhaler as she does report some shortness of breath. Unable to exclude COPD symptoms. She's not appear to have fluid overload and suggested she continue on diuretics twice a day. Symptoms do not seem to come on with exertion and she continues to play golf without symptoms. We'll hold off on stress testing. Also suggested she restart her on omeprazole.

## 2013-10-12 NOTE — Assessment & Plan Note (Deleted)
Appears to be doing well on Lasix twice a day. She does drink significant volume of coffee daily. 

## 2013-10-12 NOTE — Assessment & Plan Note (Signed)
Encouraged her to stay on her simvastatin 

## 2013-10-12 NOTE — Progress Notes (Signed)
Patient ID: Donna Lane, female    DOB: [birth year], 79 y.o.   MRN: [id number]  HPI Comments: Donna Lane is a pleasant 79 year old woman with history of coronary artery disease, catheterization August 25, 2007 showing severe three-vessel disease, with bypass surgery at [facility] September 2008 also with severe peripheral vascular disease, bilateral renal artery stenosis, carotid disease, iliac disease who presents  for routine visit.   previous episodes of chest pain  resolved by decreasing the dose of her aspirin from 325 mg daily to 81 mg daily.  Prior pain in the subxiphoid region with occasional nausea and vomiting of green bilious liquid. She attributes her discomfort to taking meloxicam which was initiated for possible gout.    She is active, continues to play golf She does report having some occasional chest tightness in the afternoon, particularly after she eats lunch. She has to loosen her bra to make her symptoms go away. Some shortness of breath. She is uncertain if she is having indigestion, muscle problem, or if her bra is too tight after eating. States she is taking Lasix 40 mg twice a day. Breathing has been better, no edema. She does drink a significant amount of coffee per day, up to a pot a day  Carotid ultrasound in the November 2012 has shown 40-59% bilateral carotid disease.  LE u/s 11/2011 suggest  50% distal aorta disease, right common iliac artery with equal or less than 50% disease, left common iliac with equal or greater than 60% disease. no severe renal artery stenosis  EKG showing normal sinus rhythm with a rate of 59  beats minute, no significant ST or T wave changes  Outpatient Encounter Prescriptions as of 10/12/2013  Medication Sig Dispense Refill  . amLODipine (NORVASC) 5 MG tablet Take 5 mg by mouth daily.        Marland Kitchen aspirin 81 MG tablet Take 2 tablets (162 mg total) by mouth daily.  30 tablet  0  . furosemide (LASIX) 40 MG tablet Take 40 mg  by mouth 2 (two) times daily.       Marland Kitchen glipiZIDE (GLUCOTROL) 5 MG tablet Take 5 mg by mouth daily.      . isosorbide mononitrate (IMDUR) 60 MG 24 hr tablet TAKE ONE-HALF TABLET BY MOUTH EVERY DAY  30 tablet  6  . meloxicam (MOBIC) 7.5 MG tablet Take 7.5 mg by mouth daily.        . metFORMIN (GLUCOPHAGE) 500 MG tablet Take 500 mg by mouth 2 (two) times daily with a meal.        . metoprolol (LOPRESSOR) 50 MG tablet Take 1 tablet (50 mg total) by mouth 2 (two) times daily.  60 tablet  5  . nitroGLYCERIN (NITROSTAT) 0.4 MG SL tablet Place 0.4 mg under the tongue every 5 (five) minutes as needed.        . polyethylene glycol (MIRALAX / GLYCOLAX) packet Take 17 g by mouth once a week.      . Probiotic Product (HEALTHY COLON PO) Take by mouth as needed.      . simvastatin (ZOCOR) 20 MG tablet TAKE ONE TABLET BY MOUTH ONCE DAILY  30 tablet  6   No facility-administered encounter medications on file as of 10/12/2013.    Review of Systems  Constitutional: Negative.   HENT: Negative.   Eyes: Negative.   Respiratory: Positive for shortness of breath.   Gastrointestinal: Negative.   Musculoskeletal: Positive for gait problem.  Skin: Negative.  Neurological: Negative.   Psychiatric/Behavioral: Negative.   All other systems reviewed and are negative.   BP 124/70  Pulse 59  Ht 5\' 4"  (1.626 m)  Wt 144 lb 12 oz (65.658 kg)  BMI 24.83 kg/m2  Physical Exam  Nursing note and vitals reviewed. Constitutional: She is oriented to person, place, and time. She appears well-developed and well-nourished.  HENT:  Head: Normocephalic.  Nose: Nose normal.  Mouth/Throat: Oropharynx is clear and moist.  Eyes: Conjunctivae are normal. Pupils are equal, round, and reactive to light.  Neck: Normal range of motion. Neck supple. No JVD present.  Cardiovascular: Normal rate, regular rhythm, S1 normal, S2 normal and intact distal pulses.  Exam reveals no gallop and no friction rub.   Murmur heard.  Crescendo  systolic murmur is present with a grade of 2/6  Pulmonary/Chest: Effort normal and breath sounds normal. No respiratory distress. She has no wheezes. She has no rales. She exhibits no tenderness.  Mildly decreased breath sounds bilaterally throughout.  Abdominal: Soft. Bowel sounds are normal. She exhibits no distension. There is no tenderness.  Musculoskeletal: Normal range of motion. She exhibits no edema and no tenderness.  Lymphadenopathy:    She has no cervical adenopathy.  Neurological: She is alert and oriented to person, place, and time. Coordination normal.  Skin: Skin is warm and dry. No rash noted. No erythema.  Psychiatric: She has a normal mood and affect. Her behavior is normal. Judgment and thought content normal.    Assessment and Plan

## 2013-10-12 NOTE — Patient Instructions (Signed)
You are doing well. Ok to retry omeprazole one a day (take a week to start working)  Ok to take lasix in the AM and 2 pm (if you are dry, hold the afternoon pill)  Use albuterol as needed for shortness of breath or tightness  Please call us if you have new issues that need to be addressed before your next appt.  Your physician wants you to follow-up in: 6 months.  You will receive a reminder letter in the mail two months in advance. If you don't receive a letter, please call our office to schedule the follow-up appointment.

## 2013-10-18 ENCOUNTER — Other Ambulatory Visit: Payer: Self-pay | Admitting: Cardiovascular Disease

## 2013-10-18 MED ORDER — ALBUTEROL SULFATE HFA 108 (90 BASE) MCG/ACT IN AERS: 2.0000 | INHALATION_SPRAY | Freq: Four times a day (QID) | RESPIRATORY_TRACT | Status: DC | PRN

## 2013-11-21 ENCOUNTER — Other Ambulatory Visit: Payer: Self-pay | Admitting: Cardiovascular Disease

## 2013-11-22 ENCOUNTER — Other Ambulatory Visit: Payer: Self-pay | Admitting: *Deleted

## 2013-11-22 MED ORDER — METOPROLOL TARTRATE 50 MG PO TABS: 50.0000 mg | ORAL_TABLET | Freq: Two times a day (BID) | ORAL | Status: DC

## 2013-11-22 NOTE — Telephone Encounter (Signed)
Requested Prescriptions   Signed Prescriptions Disp Refills  . metoprolol (LOPRESSOR) 50 MG tablet 60 tablet 5    Sig: Take 1 tablet (50 mg total) by mouth 2 (two) times daily.    Authorizing Provider: Antonieta Iba    Ordering User: Kendrick Fries

## 2013-12-23 DIAGNOSIS — J410 Simple chronic bronchitis: Secondary | ICD-10-CM | POA: Diagnosis not present

## 2014-03-01 DIAGNOSIS — R0609 Other forms of dyspnea: Secondary | ICD-10-CM | POA: Diagnosis not present

## 2014-03-01 DIAGNOSIS — R0989 Other specified symptoms and signs involving the circulatory and respiratory systems: Secondary | ICD-10-CM | POA: Diagnosis not present

## 2014-03-01 DIAGNOSIS — J449 Chronic obstructive pulmonary disease, unspecified: Secondary | ICD-10-CM | POA: Diagnosis not present

## 2014-03-04 DIAGNOSIS — IMO0002 Reserved for concepts with insufficient information to code with codable children: Secondary | ICD-10-CM | POA: Diagnosis not present

## 2014-03-04 DIAGNOSIS — R0609 Other forms of dyspnea: Secondary | ICD-10-CM | POA: Diagnosis not present

## 2014-03-04 DIAGNOSIS — I10 Essential (primary) hypertension: Secondary | ICD-10-CM | POA: Diagnosis not present

## 2014-03-04 DIAGNOSIS — IMO0001 Reserved for inherently not codable concepts without codable children: Secondary | ICD-10-CM | POA: Diagnosis not present

## 2014-03-04 DIAGNOSIS — M899 Disorder of bone, unspecified: Secondary | ICD-10-CM | POA: Diagnosis not present

## 2014-03-04 DIAGNOSIS — E785 Hyperlipidemia, unspecified: Secondary | ICD-10-CM | POA: Diagnosis not present

## 2014-03-04 DIAGNOSIS — R0989 Other specified symptoms and signs involving the circulatory and respiratory systems: Secondary | ICD-10-CM | POA: Diagnosis not present

## 2014-03-04 DIAGNOSIS — M47817 Spondylosis without myelopathy or radiculopathy, lumbosacral region: Secondary | ICD-10-CM | POA: Diagnosis not present

## 2014-03-11 ENCOUNTER — Telehealth: Payer: Self-pay | Admitting: *Deleted

## 2014-03-11 ENCOUNTER — Inpatient Hospital Stay: Payer: Self-pay | Admitting: Internal Medicine

## 2014-03-11 DIAGNOSIS — R0902 Hypoxemia: Secondary | ICD-10-CM | POA: Diagnosis not present

## 2014-03-11 DIAGNOSIS — I701 Atherosclerosis of renal artery: Secondary | ICD-10-CM | POA: Diagnosis present

## 2014-03-11 DIAGNOSIS — D638 Anemia in other chronic diseases classified elsewhere: Secondary | ICD-10-CM | POA: Diagnosis present

## 2014-03-11 DIAGNOSIS — E119 Type 2 diabetes mellitus without complications: Secondary | ICD-10-CM | POA: Diagnosis present

## 2014-03-11 DIAGNOSIS — I5030 Unspecified diastolic (congestive) heart failure: Secondary | ICD-10-CM | POA: Diagnosis not present

## 2014-03-11 DIAGNOSIS — E785 Hyperlipidemia, unspecified: Secondary | ICD-10-CM | POA: Diagnosis present

## 2014-03-11 DIAGNOSIS — N289 Disorder of kidney and ureter, unspecified: Secondary | ICD-10-CM | POA: Diagnosis not present

## 2014-03-11 DIAGNOSIS — R0602 Shortness of breath: Secondary | ICD-10-CM | POA: Diagnosis not present

## 2014-03-11 DIAGNOSIS — J449 Chronic obstructive pulmonary disease, unspecified: Secondary | ICD-10-CM | POA: Diagnosis not present

## 2014-03-11 DIAGNOSIS — I129 Hypertensive chronic kidney disease with stage 1 through stage 4 chronic kidney disease, or unspecified chronic kidney disease: Secondary | ICD-10-CM | POA: Diagnosis present

## 2014-03-11 DIAGNOSIS — N189 Chronic kidney disease, unspecified: Secondary | ICD-10-CM | POA: Diagnosis not present

## 2014-03-11 DIAGNOSIS — I10 Essential (primary) hypertension: Secondary | ICD-10-CM | POA: Diagnosis not present

## 2014-03-11 DIAGNOSIS — I509 Heart failure, unspecified: Secondary | ICD-10-CM | POA: Diagnosis not present

## 2014-03-11 DIAGNOSIS — I059 Rheumatic mitral valve disease, unspecified: Secondary | ICD-10-CM | POA: Diagnosis not present

## 2014-03-11 DIAGNOSIS — I2510 Atherosclerotic heart disease of native coronary artery without angina pectoris: Secondary | ICD-10-CM | POA: Diagnosis present

## 2014-03-11 DIAGNOSIS — J4489 Other specified chronic obstructive pulmonary disease: Secondary | ICD-10-CM | POA: Diagnosis not present

## 2014-03-11 DIAGNOSIS — I2789 Other specified pulmonary heart diseases: Secondary | ICD-10-CM | POA: Diagnosis present

## 2014-03-11 DIAGNOSIS — Z87891 Personal history of nicotine dependence: Secondary | ICD-10-CM | POA: Diagnosis not present

## 2014-03-11 DIAGNOSIS — Z951 Presence of aortocoronary bypass graft: Secondary | ICD-10-CM | POA: Diagnosis not present

## 2014-03-11 DIAGNOSIS — Z7982 Long term (current) use of aspirin: Secondary | ICD-10-CM | POA: Diagnosis not present

## 2014-03-11 DIAGNOSIS — I739 Peripheral vascular disease, unspecified: Secondary | ICD-10-CM | POA: Diagnosis not present

## 2014-03-11 DIAGNOSIS — I5033 Acute on chronic diastolic (congestive) heart failure: Secondary | ICD-10-CM | POA: Diagnosis present

## 2014-03-11 DIAGNOSIS — N182 Chronic kidney disease, stage 2 (mild): Secondary | ICD-10-CM | POA: Diagnosis not present

## 2014-03-11 LAB — BASIC METABOLIC PANEL
Anion Gap: 4 — ABNORMAL LOW (ref 7–16)
BUN: 20 mg/dL — AB (ref 7–18)
CALCIUM: 9.1 mg/dL (ref 8.5–10.1)
CO2: 30 mmol/L (ref 21–32)
CREATININE: 1.58 mg/dL — AB (ref 0.60–1.30)
Chloride: 101 mmol/L (ref 98–107)
GFR CALC AF AMER: 34 — AB
GFR CALC NON AF AMER: 29 — AB
GLUCOSE: 185 mg/dL — AB (ref 65–99)
OSMOLALITY: 278 (ref 275–301)
Potassium: 4.2 mmol/L (ref 3.5–5.1)
Sodium: 135 mmol/L — ABNORMAL LOW (ref 136–145)

## 2014-03-11 LAB — CBC WITH DIFFERENTIAL/PLATELET
Basophil #: 0.1 10*3/uL (ref 0.0–0.1)
Basophil %: 1.1 %
EOS ABS: 0.2 10*3/uL (ref 0.0–0.7)
Eosinophil %: 2.8 %
HCT: 37.7 % (ref 35.0–47.0)
HGB: 12.3 g/dL (ref 12.0–16.0)
LYMPHS ABS: 0.7 10*3/uL — AB (ref 1.0–3.6)
Lymphocyte %: 7.9 %
MCH: 29.6 pg (ref 26.0–34.0)
MCHC: 32.6 g/dL (ref 32.0–36.0)
MCV: 91 fL (ref 80–100)
Monocyte #: 0.7 x10 3/mm (ref 0.2–0.9)
Monocyte %: 7.7 %
NEUTROS PCT: 80.5 %
Neutrophil #: 7.3 10*3/uL — ABNORMAL HIGH (ref 1.4–6.5)
PLATELETS: 163 10*3/uL (ref 150–440)
RBC: 4.15 10*6/uL (ref 3.80–5.20)
RDW: 13.7 % (ref 11.5–14.5)
WBC: 9 10*3/uL (ref 3.6–11.0)

## 2014-03-11 LAB — CK-MB: CK-MB: 1.4 ng/mL (ref 0.5–3.6)

## 2014-03-11 LAB — TROPONIN I
Troponin-I: <0.02 ng/mL
Troponin-I: <0.02 ng/mL

## 2014-03-11 LAB — D-DIMER(ARMC): D-DIMER: 1436 ng/mL

## 2014-03-11 LAB — PRO B NATRIURETIC PEPTIDE: B-Type Natriuretic Peptide: 2221 pg/mL — ABNORMAL HIGH (ref 0–450)

## 2014-03-11 NOTE — Telephone Encounter (Signed)
Please call patient she is having sob and not sure what to do please advise.

## 2014-03-11 NOTE — Telephone Encounter (Signed)
Pt presented to [facility] ED.

## 2014-03-11 NOTE — Telephone Encounter (Signed)
Spoke w/ pt.  She reports that she has been experiencing sob for the past 10 days since her inhalers were changed by her PCP. Pt reports her wt has remained stable, denies swelling or change in her diet.  States that her sx started after inhalers were changed and she wants those adjusted.  Pt denies seeing a pulmonologist, states "I don't want to see another doctor, I want something done..I know it's not my heart, these inhalers aren't working". Advised pt to contact her PCP.  She states that she recently saw him, he changed her inhalers, but they are not working. Advised to to contact Dr. Hardin Negus' office and let them know what is going on so that he may change her meds, if needed. Pt raised her voice and repeatedly states "Dr. [name] told me to call if I need anything at all". Discussed w/ pt the need to contact her PCP and offered her an appt to see Nicole Kindred, [place] this afternoon. She states that she is going to the ED and wants Dr. [name] to "directly admit" her from there so that she does not have to wait. Advised pt that if she calls her PCP or comes in to see Nicole Kindred, that there is no need to go to ED and she may be able to be treated over the phone.  Asked pt not to yell at me, to which she replied "if you felt as bad as I do, you would yell, too." Apologized to pt and asked what exactly she needed me to do for her, to which she replied that she doesn't know, that she was told to call our office if she needed anything.  Again offered her appt to see Nicole Kindred, [place] this afternoon when pt hung up on me.   Called back and spoke w/ pt's husband.  Apologized to him for their frustration.  He states "Dr. [name] told us the last time we were there to call anytime that we need anything, but what's the point in calling if you're not going to do anything?" Discussed w/ him the previous conversation w/ his wife.  Reiterated the need to discuss w/ pt's PCP and offered appt to be seen this afternoon.  He states that he  and his wife will discuss whether to call PCP, go to ED, or come to the office. He will call back if pt would like to see Nicole Kindred today.

## 2014-03-12 DIAGNOSIS — I739 Peripheral vascular disease, unspecified: Secondary | ICD-10-CM | POA: Diagnosis not present

## 2014-03-12 DIAGNOSIS — N182 Chronic kidney disease, stage 2 (mild): Secondary | ICD-10-CM | POA: Diagnosis not present

## 2014-03-12 DIAGNOSIS — I2510 Atherosclerotic heart disease of native coronary artery without angina pectoris: Secondary | ICD-10-CM | POA: Diagnosis not present

## 2014-03-12 DIAGNOSIS — J449 Chronic obstructive pulmonary disease, unspecified: Secondary | ICD-10-CM | POA: Diagnosis not present

## 2014-03-12 DIAGNOSIS — I5030 Unspecified diastolic (congestive) heart failure: Secondary | ICD-10-CM | POA: Diagnosis not present

## 2014-03-12 DIAGNOSIS — I059 Rheumatic mitral valve disease, unspecified: Secondary | ICD-10-CM | POA: Diagnosis not present

## 2014-03-12 DIAGNOSIS — I509 Heart failure, unspecified: Secondary | ICD-10-CM | POA: Diagnosis not present

## 2014-03-12 DIAGNOSIS — I10 Essential (primary) hypertension: Secondary | ICD-10-CM | POA: Diagnosis not present

## 2014-03-12 LAB — CK-MB: CK-MB: 1 ng/mL (ref 0.5–3.6)

## 2014-03-12 LAB — CREATININE, SERUM
CREATININE: 1.56 mg/dL — AB (ref 0.60–1.30)
EGFR (African American): 35 — ABNORMAL LOW
GFR CALC NON AF AMER: 30 — AB

## 2014-03-12 LAB — TROPONIN I: Troponin-I: <0.02 ng/mL

## 2014-03-13 DIAGNOSIS — J449 Chronic obstructive pulmonary disease, unspecified: Secondary | ICD-10-CM | POA: Diagnosis not present

## 2014-03-13 DIAGNOSIS — I059 Rheumatic mitral valve disease, unspecified: Secondary | ICD-10-CM | POA: Diagnosis not present

## 2014-03-13 DIAGNOSIS — N182 Chronic kidney disease, stage 2 (mild): Secondary | ICD-10-CM | POA: Diagnosis not present

## 2014-03-13 DIAGNOSIS — N289 Disorder of kidney and ureter, unspecified: Secondary | ICD-10-CM

## 2014-03-13 DIAGNOSIS — I10 Essential (primary) hypertension: Secondary | ICD-10-CM | POA: Diagnosis not present

## 2014-03-13 DIAGNOSIS — I5030 Unspecified diastolic (congestive) heart failure: Secondary | ICD-10-CM | POA: Diagnosis not present

## 2014-03-13 LAB — BASIC METABOLIC PANEL
ANION GAP: 4 — AB (ref 7–16)
BUN: 27 mg/dL — ABNORMAL HIGH (ref 7–18)
CALCIUM: 8.5 mg/dL (ref 8.5–10.1)
CREATININE: 1.91 mg/dL — AB (ref 0.60–1.30)
Chloride: 99 mmol/L (ref 98–107)
Co2: 32 mmol/L (ref 21–32)
GFR CALC AF AMER: 27 — AB
GFR CALC NON AF AMER: 23 — AB
GLUCOSE: 360 mg/dL — AB (ref 65–99)
OSMOLALITY: 290 (ref 275–301)
Potassium: 3.7 mmol/L (ref 3.5–5.1)
SODIUM: 135 mmol/L — AB (ref 136–145)

## 2014-03-14 ENCOUNTER — Telehealth: Payer: Self-pay

## 2014-03-14 DIAGNOSIS — I10 Essential (primary) hypertension: Secondary | ICD-10-CM | POA: Diagnosis not present

## 2014-03-14 DIAGNOSIS — N289 Disorder of kidney and ureter, unspecified: Secondary | ICD-10-CM

## 2014-03-14 DIAGNOSIS — I5030 Unspecified diastolic (congestive) heart failure: Secondary | ICD-10-CM | POA: Diagnosis not present

## 2014-03-14 DIAGNOSIS — J449 Chronic obstructive pulmonary disease, unspecified: Secondary | ICD-10-CM | POA: Diagnosis not present

## 2014-03-14 DIAGNOSIS — N189 Chronic kidney disease, unspecified: Secondary | ICD-10-CM | POA: Diagnosis not present

## 2014-03-14 DIAGNOSIS — I509 Heart failure, unspecified: Secondary | ICD-10-CM | POA: Diagnosis not present

## 2014-03-14 DIAGNOSIS — N182 Chronic kidney disease, stage 2 (mild): Secondary | ICD-10-CM | POA: Diagnosis not present

## 2014-03-14 DIAGNOSIS — Z8679 Personal history of other diseases of the circulatory system: Secondary | ICD-10-CM

## 2014-03-14 LAB — BASIC METABOLIC PANEL
Anion Gap: 9 (ref 7–16)
BUN: 30 mg/dL — AB (ref 7–18)
CREATININE: 1.94 mg/dL — AB (ref 0.60–1.30)
Calcium, Total: 8.8 mg/dL (ref 8.5–10.1)
Chloride: 99 mmol/L (ref 98–107)
Co2: 28 mmol/L (ref 21–32)
EGFR (African American): 27 — ABNORMAL LOW
EGFR (Non-African Amer.): 23 — ABNORMAL LOW
Glucose: 103 mg/dL — ABNORMAL HIGH (ref 65–99)
OSMOLALITY: 278 (ref 275–301)
Potassium: 3.5 mmol/L (ref 3.5–5.1)
SODIUM: 136 mmol/L (ref 136–145)

## 2014-03-14 NOTE — Telephone Encounter (Signed)
Left message for pt to call and schedule renal artery u/s.

## 2014-03-14 NOTE — Telephone Encounter (Signed)
Pt sched for 03/29/14.

## 2014-03-17 ENCOUNTER — Other Ambulatory Visit: Payer: Self-pay | Admitting: Cardiovascular Disease

## 2014-03-17 DIAGNOSIS — I10 Essential (primary) hypertension: Secondary | ICD-10-CM | POA: Diagnosis not present

## 2014-03-24 DIAGNOSIS — I509 Heart failure, unspecified: Secondary | ICD-10-CM | POA: Diagnosis not present

## 2014-03-24 DIAGNOSIS — R0609 Other forms of dyspnea: Secondary | ICD-10-CM | POA: Diagnosis not present

## 2014-03-31 DIAGNOSIS — J449 Chronic obstructive pulmonary disease, unspecified: Secondary | ICD-10-CM | POA: Diagnosis not present

## 2014-03-31 DIAGNOSIS — I10 Essential (primary) hypertension: Secondary | ICD-10-CM | POA: Diagnosis not present

## 2014-03-31 DIAGNOSIS — R1084 Generalized abdominal pain: Secondary | ICD-10-CM | POA: Diagnosis not present

## 2014-04-01 ENCOUNTER — Ambulatory Visit: Payer: Self-pay

## 2014-04-01 DIAGNOSIS — R1011 Right upper quadrant pain: Secondary | ICD-10-CM | POA: Diagnosis not present

## 2014-04-01 DIAGNOSIS — R634 Abnormal weight loss: Secondary | ICD-10-CM | POA: Diagnosis not present

## 2014-04-01 DIAGNOSIS — J90 Pleural effusion, not elsewhere classified: Secondary | ICD-10-CM | POA: Diagnosis not present

## 2014-04-01 DIAGNOSIS — R109 Unspecified abdominal pain: Secondary | ICD-10-CM | POA: Diagnosis not present

## 2014-04-12 ENCOUNTER — Ambulatory Visit: Payer: PRIVATE HEALTH INSURANCE | Admitting: Cardiovascular Disease

## 2014-04-12 DIAGNOSIS — E119 Type 2 diabetes mellitus without complications: Secondary | ICD-10-CM | POA: Diagnosis not present

## 2014-04-16 ENCOUNTER — Inpatient Hospital Stay: Payer: Self-pay | Admitting: Internal Medicine

## 2014-04-16 DIAGNOSIS — I5031 Acute diastolic (congestive) heart failure: Secondary | ICD-10-CM | POA: Diagnosis not present

## 2014-04-16 DIAGNOSIS — J441 Chronic obstructive pulmonary disease with (acute) exacerbation: Secondary | ICD-10-CM | POA: Diagnosis not present

## 2014-04-16 DIAGNOSIS — I10 Essential (primary) hypertension: Secondary | ICD-10-CM | POA: Diagnosis not present

## 2014-04-16 DIAGNOSIS — R0602 Shortness of breath: Secondary | ICD-10-CM | POA: Diagnosis not present

## 2014-04-16 DIAGNOSIS — J9600 Acute respiratory failure, unspecified whether with hypoxia or hypercapnia: Secondary | ICD-10-CM | POA: Diagnosis not present

## 2014-04-16 DIAGNOSIS — I509 Heart failure, unspecified: Secondary | ICD-10-CM | POA: Diagnosis not present

## 2014-04-16 DIAGNOSIS — R0789 Other chest pain: Secondary | ICD-10-CM | POA: Diagnosis not present

## 2014-04-16 LAB — CBC
HCT: 41.1 % (ref 35.0–47.0)
HGB: 13 g/dL (ref 12.0–16.0)
MCH: 28.9 pg (ref 26.0–34.0)
MCHC: 31.7 g/dL — ABNORMAL LOW (ref 32.0–36.0)
MCV: 91 fL (ref 80–100)
PLATELETS: 199 10*3/uL (ref 150–440)
RBC: 4.51 10*6/uL (ref 3.80–5.20)
RDW: 13.2 % (ref 11.5–14.5)
WBC: 13.6 10*3/uL — AB (ref 3.6–11.0)

## 2014-04-16 LAB — COMPREHENSIVE METABOLIC PANEL
ALBUMIN: 3.6 g/dL (ref 3.4–5.0)
ALK PHOS: 92 U/L
ANION GAP: 8 (ref 7–16)
AST: 22 U/L (ref 15–37)
BUN: 16 mg/dL (ref 7–18)
Bilirubin,Total: 0.5 mg/dL (ref 0.2–1.0)
CALCIUM: 9.5 mg/dL (ref 8.5–10.1)
CHLORIDE: 102 mmol/L (ref 98–107)
Co2: 26 mmol/L (ref 21–32)
Creatinine: 1.25 mg/dL (ref 0.60–1.30)
EGFR (African American): 45 — ABNORMAL LOW
GFR CALC NON AF AMER: 39 — AB
GLUCOSE: 228 mg/dL — AB (ref 65–99)
OSMOLALITY: 280 (ref 275–301)
POTASSIUM: 4.4 mmol/L (ref 3.5–5.1)
SGPT (ALT): 14 U/L (ref 12–78)
Sodium: 136 mmol/L (ref 136–145)
Total Protein: 7.4 g/dL (ref 6.4–8.2)

## 2014-04-16 LAB — TROPONIN I: Troponin-I: <0.02 ng/mL

## 2014-04-16 LAB — PRO B NATRIURETIC PEPTIDE: B-Type Natriuretic Peptide: 2241 pg/mL — ABNORMAL HIGH (ref 0–450)

## 2014-04-17 DIAGNOSIS — I5031 Acute diastolic (congestive) heart failure: Secondary | ICD-10-CM | POA: Diagnosis not present

## 2014-04-17 DIAGNOSIS — J441 Chronic obstructive pulmonary disease with (acute) exacerbation: Secondary | ICD-10-CM | POA: Diagnosis not present

## 2014-04-17 DIAGNOSIS — J9600 Acute respiratory failure, unspecified whether with hypoxia or hypercapnia: Secondary | ICD-10-CM | POA: Diagnosis not present

## 2014-04-17 DIAGNOSIS — I10 Essential (primary) hypertension: Secondary | ICD-10-CM | POA: Diagnosis not present

## 2014-04-17 DIAGNOSIS — N179 Acute kidney failure, unspecified: Secondary | ICD-10-CM | POA: Diagnosis not present

## 2014-04-17 LAB — BASIC METABOLIC PANEL
ANION GAP: 9 (ref 7–16)
BUN: 23 mg/dL — ABNORMAL HIGH (ref 7–18)
CALCIUM: 9 mg/dL (ref 8.5–10.1)
CREATININE: 1.49 mg/dL — AB (ref 0.60–1.30)
Chloride: 103 mmol/L (ref 98–107)
Co2: 25 mmol/L (ref 21–32)
EGFR (African American): 36 — ABNORMAL LOW
EGFR (Non-African Amer.): 31 — ABNORMAL LOW
GLUCOSE: 312 mg/dL — AB (ref 65–99)
Osmolality: 289 (ref 275–301)
Potassium: 4.4 mmol/L (ref 3.5–5.1)
Sodium: 137 mmol/L (ref 136–145)

## 2014-04-17 LAB — MAGNESIUM: Magnesium: 1.9 mg/dL

## 2014-04-18 DIAGNOSIS — I5031 Acute diastolic (congestive) heart failure: Secondary | ICD-10-CM | POA: Diagnosis not present

## 2014-04-18 DIAGNOSIS — N179 Acute kidney failure, unspecified: Secondary | ICD-10-CM | POA: Diagnosis not present

## 2014-04-18 DIAGNOSIS — J441 Chronic obstructive pulmonary disease with (acute) exacerbation: Secondary | ICD-10-CM | POA: Diagnosis not present

## 2014-04-18 DIAGNOSIS — J9600 Acute respiratory failure, unspecified whether with hypoxia or hypercapnia: Secondary | ICD-10-CM | POA: Diagnosis not present

## 2014-04-18 DIAGNOSIS — R079 Chest pain, unspecified: Secondary | ICD-10-CM

## 2014-04-18 DIAGNOSIS — R072 Precordial pain: Secondary | ICD-10-CM | POA: Diagnosis not present

## 2014-04-18 DIAGNOSIS — I10 Essential (primary) hypertension: Secondary | ICD-10-CM | POA: Diagnosis not present

## 2014-04-18 DIAGNOSIS — I5033 Acute on chronic diastolic (congestive) heart failure: Secondary | ICD-10-CM

## 2014-04-18 LAB — BASIC METABOLIC PANEL
Anion Gap: 8 (ref 7–16)
BUN: 39 mg/dL — ABNORMAL HIGH (ref 7–18)
CREATININE: 1.96 mg/dL — AB (ref 0.60–1.30)
Calcium, Total: 9.3 mg/dL (ref 8.5–10.1)
Chloride: 100 mmol/L (ref 98–107)
Co2: 27 mmol/L (ref 21–32)
EGFR (African American): 26 — ABNORMAL LOW
EGFR (Non-African Amer.): 23 — ABNORMAL LOW
Glucose: 247 mg/dL — ABNORMAL HIGH (ref 65–99)
Osmolality: 288 (ref 275–301)
POTASSIUM: 4.2 mmol/L (ref 3.5–5.1)
Sodium: 135 mmol/L — ABNORMAL LOW (ref 136–145)

## 2014-04-18 LAB — TROPONIN I: Troponin-I: <0.02 ng/mL

## 2014-04-19 DIAGNOSIS — J441 Chronic obstructive pulmonary disease with (acute) exacerbation: Secondary | ICD-10-CM

## 2014-04-19 DIAGNOSIS — N179 Acute kidney failure, unspecified: Secondary | ICD-10-CM | POA: Diagnosis not present

## 2014-04-19 DIAGNOSIS — I5033 Acute on chronic diastolic (congestive) heart failure: Secondary | ICD-10-CM | POA: Diagnosis not present

## 2014-04-19 DIAGNOSIS — I10 Essential (primary) hypertension: Secondary | ICD-10-CM | POA: Diagnosis not present

## 2014-04-19 DIAGNOSIS — I5031 Acute diastolic (congestive) heart failure: Secondary | ICD-10-CM | POA: Diagnosis not present

## 2014-04-19 DIAGNOSIS — J9600 Acute respiratory failure, unspecified whether with hypoxia or hypercapnia: Secondary | ICD-10-CM | POA: Diagnosis not present

## 2014-04-19 LAB — BASIC METABOLIC PANEL
ANION GAP: 7 (ref 7–16)
BUN: 38 mg/dL — AB (ref 7–18)
CALCIUM: 9 mg/dL (ref 8.5–10.1)
CHLORIDE: 104 mmol/L (ref 98–107)
Co2: 26 mmol/L (ref 21–32)
Creatinine: 1.89 mg/dL — ABNORMAL HIGH (ref 0.60–1.30)
EGFR (African American): 27 — ABNORMAL LOW
GFR CALC NON AF AMER: 24 — AB
GLUCOSE: 180 mg/dL — AB (ref 65–99)
OSMOLALITY: 287 (ref 275–301)
Potassium: 4 mmol/L (ref 3.5–5.1)
SODIUM: 137 mmol/L (ref 136–145)

## 2014-04-20 DIAGNOSIS — I10 Essential (primary) hypertension: Secondary | ICD-10-CM | POA: Diagnosis not present

## 2014-04-20 DIAGNOSIS — E119 Type 2 diabetes mellitus without complications: Secondary | ICD-10-CM | POA: Diagnosis not present

## 2014-04-20 DIAGNOSIS — J441 Chronic obstructive pulmonary disease with (acute) exacerbation: Secondary | ICD-10-CM | POA: Diagnosis not present

## 2014-04-20 DIAGNOSIS — M069 Rheumatoid arthritis, unspecified: Secondary | ICD-10-CM | POA: Diagnosis not present

## 2014-04-20 DIAGNOSIS — I2510 Atherosclerotic heart disease of native coronary artery without angina pectoris: Secondary | ICD-10-CM | POA: Diagnosis not present

## 2014-04-20 DIAGNOSIS — I5033 Acute on chronic diastolic (congestive) heart failure: Secondary | ICD-10-CM | POA: Diagnosis not present

## 2014-04-20 DIAGNOSIS — I509 Heart failure, unspecified: Secondary | ICD-10-CM | POA: Diagnosis not present

## 2014-04-21 ENCOUNTER — Telehealth: Payer: Self-pay

## 2014-04-21 ENCOUNTER — Ambulatory Visit: Payer: PRIVATE HEALTH INSURANCE | Admitting: Cardiovascular Disease

## 2014-04-21 NOTE — Telephone Encounter (Signed)
Patient contacted regarding discharge from [facility] on 04/19/14.  Patient understands to follow up with Dr. [name] on 04/27/14 at 11:15 at [facility]. Patient understands discharge instructions? yes Patient understands medications and regiment? yes Patient understands to bring all medications to this visit? yes

## 2014-04-25 DIAGNOSIS — M069 Rheumatoid arthritis, unspecified: Secondary | ICD-10-CM | POA: Diagnosis not present

## 2014-04-25 DIAGNOSIS — J441 Chronic obstructive pulmonary disease with (acute) exacerbation: Secondary | ICD-10-CM | POA: Diagnosis not present

## 2014-04-25 DIAGNOSIS — I5033 Acute on chronic diastolic (congestive) heart failure: Secondary | ICD-10-CM | POA: Diagnosis not present

## 2014-04-25 DIAGNOSIS — E119 Type 2 diabetes mellitus without complications: Secondary | ICD-10-CM | POA: Diagnosis not present

## 2014-04-25 DIAGNOSIS — I509 Heart failure, unspecified: Secondary | ICD-10-CM | POA: Diagnosis not present

## 2014-04-25 DIAGNOSIS — I2510 Atherosclerotic heart disease of native coronary artery without angina pectoris: Secondary | ICD-10-CM | POA: Diagnosis not present

## 2014-04-26 ENCOUNTER — Other Ambulatory Visit: Payer: Self-pay | Admitting: Cardiovascular Disease

## 2014-04-26 DIAGNOSIS — E119 Type 2 diabetes mellitus without complications: Secondary | ICD-10-CM | POA: Diagnosis not present

## 2014-04-26 DIAGNOSIS — M069 Rheumatoid arthritis, unspecified: Secondary | ICD-10-CM | POA: Diagnosis not present

## 2014-04-26 DIAGNOSIS — J441 Chronic obstructive pulmonary disease with (acute) exacerbation: Secondary | ICD-10-CM | POA: Diagnosis not present

## 2014-04-26 DIAGNOSIS — I2510 Atherosclerotic heart disease of native coronary artery without angina pectoris: Secondary | ICD-10-CM | POA: Diagnosis not present

## 2014-04-26 DIAGNOSIS — I509 Heart failure, unspecified: Secondary | ICD-10-CM | POA: Diagnosis not present

## 2014-04-26 DIAGNOSIS — I5033 Acute on chronic diastolic (congestive) heart failure: Secondary | ICD-10-CM | POA: Diagnosis not present

## 2014-04-27 ENCOUNTER — Ambulatory Visit (INDEPENDENT_AMBULATORY_CARE_PROVIDER_SITE_OTHER): Payer: Medicare Other | Admitting: Cardiovascular Disease

## 2014-04-27 ENCOUNTER — Encounter: Payer: Self-pay | Admitting: Cardiovascular Disease

## 2014-04-27 VITALS — BP 120/60 | Ht 63.0 in | Wt 137.8 lb

## 2014-04-27 DIAGNOSIS — I5032 Chronic diastolic (congestive) heart failure: Secondary | ICD-10-CM | POA: Diagnosis not present

## 2014-04-27 DIAGNOSIS — E785 Hyperlipidemia, unspecified: Secondary | ICD-10-CM

## 2014-04-27 DIAGNOSIS — I25810 Atherosclerosis of coronary artery bypass graft(s) without angina pectoris: Secondary | ICD-10-CM

## 2014-04-27 DIAGNOSIS — I10 Essential (primary) hypertension: Secondary | ICD-10-CM | POA: Diagnosis not present

## 2014-04-27 DIAGNOSIS — E119 Type 2 diabetes mellitus without complications: Secondary | ICD-10-CM | POA: Diagnosis not present

## 2014-04-27 DIAGNOSIS — I509 Heart failure, unspecified: Secondary | ICD-10-CM

## 2014-04-27 MED ORDER — FUROSEMIDE 40 MG PO TABS: 40.0000 mg | ORAL_TABLET | Freq: Two times a day (BID) | ORAL | Status: DC | PRN

## 2014-04-27 NOTE — Patient Instructions (Addendum)
Stop the amlodipine Start lasix one a day  Please monitor your blood pressure at home Call the office with numbers  If leg swelling gets worse,  Take an extra lasix after lunch  Please call us if you have new issues that need to be addressed before your next appt.  Your physician wants you to follow-up in: 2 months.

## 2014-04-27 NOTE — Assessment & Plan Note (Signed)
Recommended she stay on her statin. Goal LDL less than 70

## 2014-04-27 NOTE — Assessment & Plan Note (Signed)
We have encouraged continued exercise, careful diet management in an effort to lose weight. 

## 2014-04-27 NOTE — Assessment & Plan Note (Addendum)
I suspect her recent admission was for COPD exacerbation, possible mild acute on chronic diastolic CHF. Some dietary indiscretion recently. We have recommended she restart Lasix 40 mg daily given her worsening foot edema. We'll also hold the amlodipine as this could contribute to her peripheral edema. We have recommended Lasix twice a day for worsening leg edema.

## 2014-04-27 NOTE — Assessment & Plan Note (Signed)
Blood pressure borderline elevated today. Without amlodipine, she will monitor her blood pressure at home. Hydralazine could be increased if needed

## 2014-04-27 NOTE — Progress Notes (Signed)
Patient ID: Donna Lane, female    DOB: March 24, 1925, 80 y.o.   MRN: [id number]  HPI Comments: Donna Lane is a pleasant 80 year old woman with history of coronary artery disease, catheterization August 25, 2007 showing severe three-vessel disease, with bypass surgery at [facility] September 2008 also with severe peripheral vascular disease, bilateral renal artery stenosis, carotid disease, iliac disease who presents  for routine visit.   previous episodes of chest pain  resolved by decreasing the dose of her aspirin from 325 mg daily to 81 mg daily.  Prior pain in the subxiphoid region with occasional nausea and vomiting of green bilious liquid. She attributes her discomfort to taking meloxicam which was initiated for possible gout.   Previously was taking Lasix 40 mg twice a day. Breathing was good, with no edema. She does drink a significant amount of coffee per day, up to a pot a day  Recently in the hospital may third to 04/19/2014 with COPD exacerbation, acute on chronic diastolic CHF She was given IV Lasix with worsening renal dysfunction also given IV steroids, nebulizers with good improvement of her shortness of breath Since discharge she feels well. On discharge she was told to hold her Lasix given her renal dysfunction. Since then she has had worsening foot swelling. Baseline creatinine before diuresis was 1.25. After diuresis was 1.96 Carotid ultrasound in the November 2012 has shown 40-59% bilateral carotid disease.  LE u/s 11/2011 suggest  50% distal aorta disease, right common iliac artery with equal or less than 50% disease, left common iliac with equal or greater than 60% disease. no severe renal artery stenosis  EKG showing normal sinus rhythm with a rate of 76  beats minute, no significant ST or T wave changes  Outpatient Encounter Prescriptions as of 04/27/2014  Medication Sig  . aspirin 81 MG tablet Take 2 tablets (162 mg total) by mouth daily.  Marland Kitchen  glipiZIDE (GLUCOTROL) 5 MG tablet Take 5 mg by mouth daily before breakfast.   . hydrALAZINE (APRESOLINE) 25 MG tablet Take 25 mg by mouth 3 (three) times daily.   . isosorbide mononitrate (IMDUR) 60 MG 24 hr tablet TAKE ONE TABLET BY MOUTH ONCE DAILY  . LEVEMIR FLEXTOUCH 100 UNIT/ML Pen Inject 5 Units into the skin daily at 10 pm.   . meloxicam (MOBIC) 7.5 MG tablet Take 7.5 mg by mouth 2 (two) times daily.   . nitroGLYCERIN (NITROSTAT) 0.4 MG SL tablet Place 0.4 mg under the tongue every 5 (five) minutes as needed.    . pantoprazole (PROTONIX) 40 MG tablet Take 40 mg by mouth 2 (two) times daily.   . polyethylene glycol (MIRALAX / GLYCOLAX) packet Take 17 g by mouth once a week.  . Probiotic Product (HEALTHY COLON PO) Take by mouth as needed.  . [DISCONTINUED] amLODipine (NORVASC) 5 MG tablet Take 5 mg by mouth daily.    . [DISCONTINUED] isosorbide mononitrate (IMDUR) 60 MG 24 hr tablet TAKE ONE-HALF TABLET BY MOUTH ONCE DAILY  . [DISCONTINUED] simvastatin (ZOCOR) 20 MG tablet TAKE ONE TABLET BY MOUTH ONCE DAILY  . furosemide (LASIX) 40 MG tablet Take 1 tablet (40 mg total) by mouth 2 (two) times daily as needed.    Review of Systems  Constitutional: Negative.   HENT: Negative.   Eyes: Negative.   Respiratory: Positive for shortness of breath.   Gastrointestinal: Negative.   Musculoskeletal: Positive for gait problem.  Skin: Negative.   Neurological: Negative.   Psychiatric/Behavioral: Negative.   All other  systems reviewed and are negative.  BP 120/60  Ht 5\' 3"  (1.6 m)  Wt 137 lb 12 oz (62.483 kg)  BMI 24.41 kg/m2  Physical Exam  Nursing note and vitals reviewed. Constitutional: She is oriented to person, place, and time. She appears well-developed and well-nourished.  HENT:  Head: Normocephalic.  Nose: Nose normal.  Mouth/Throat: Oropharynx is clear and moist.  Eyes: Conjunctivae are normal. Pupils are equal, round, and reactive to light.  Neck: Normal range of motion.  Neck supple. No JVD present.  Cardiovascular: Normal rate, regular rhythm, S1 normal, S2 normal and intact distal pulses.  Exam reveals no gallop and no friction rub.   Murmur heard.  Crescendo systolic murmur is present with a grade of 2/6  Pulmonary/Chest: Effort normal and breath sounds normal. No respiratory distress. She has no wheezes. She has no rales. She exhibits no tenderness.  Mildly decreased breath sounds bilaterally throughout.  Abdominal: Soft. Bowel sounds are normal. She exhibits no distension. There is no tenderness.  Musculoskeletal: Normal range of motion. She exhibits no edema and no tenderness.  Lymphadenopathy:    She has no cervical adenopathy.  Neurological: She is alert and oriented to person, place, and time. Coordination normal.  Skin: Skin is warm and dry. No rash noted. No erythema.  Psychiatric: She has a normal mood and affect. Her behavior is normal. Judgment and thought content normal.    Assessment and Plan

## 2014-04-27 NOTE — Assessment & Plan Note (Signed)
Currently with no symptoms of angina. No further workup at this time. Continue current medication regimen. 

## 2014-04-28 DIAGNOSIS — I2510 Atherosclerotic heart disease of native coronary artery without angina pectoris: Secondary | ICD-10-CM | POA: Diagnosis not present

## 2014-04-28 DIAGNOSIS — E119 Type 2 diabetes mellitus without complications: Secondary | ICD-10-CM | POA: Diagnosis not present

## 2014-04-28 DIAGNOSIS — I5033 Acute on chronic diastolic (congestive) heart failure: Secondary | ICD-10-CM | POA: Diagnosis not present

## 2014-04-28 DIAGNOSIS — M069 Rheumatoid arthritis, unspecified: Secondary | ICD-10-CM | POA: Diagnosis not present

## 2014-04-28 DIAGNOSIS — J441 Chronic obstructive pulmonary disease with (acute) exacerbation: Secondary | ICD-10-CM | POA: Diagnosis not present

## 2014-04-28 DIAGNOSIS — I509 Heart failure, unspecified: Secondary | ICD-10-CM | POA: Diagnosis not present

## 2014-04-29 DIAGNOSIS — IMO0001 Reserved for inherently not codable concepts without codable children: Secondary | ICD-10-CM | POA: Diagnosis not present

## 2014-05-02 DIAGNOSIS — I2510 Atherosclerotic heart disease of native coronary artery without angina pectoris: Secondary | ICD-10-CM | POA: Diagnosis not present

## 2014-05-02 DIAGNOSIS — J441 Chronic obstructive pulmonary disease with (acute) exacerbation: Secondary | ICD-10-CM | POA: Diagnosis not present

## 2014-05-02 DIAGNOSIS — I509 Heart failure, unspecified: Secondary | ICD-10-CM | POA: Diagnosis not present

## 2014-05-02 DIAGNOSIS — E119 Type 2 diabetes mellitus without complications: Secondary | ICD-10-CM | POA: Diagnosis not present

## 2014-05-02 DIAGNOSIS — M069 Rheumatoid arthritis, unspecified: Secondary | ICD-10-CM | POA: Diagnosis not present

## 2014-05-02 DIAGNOSIS — I5033 Acute on chronic diastolic (congestive) heart failure: Secondary | ICD-10-CM | POA: Diagnosis not present

## 2014-05-03 DIAGNOSIS — E119 Type 2 diabetes mellitus without complications: Secondary | ICD-10-CM | POA: Diagnosis not present

## 2014-05-03 DIAGNOSIS — I5033 Acute on chronic diastolic (congestive) heart failure: Secondary | ICD-10-CM | POA: Diagnosis not present

## 2014-05-03 DIAGNOSIS — M069 Rheumatoid arthritis, unspecified: Secondary | ICD-10-CM | POA: Diagnosis not present

## 2014-05-03 DIAGNOSIS — I2510 Atherosclerotic heart disease of native coronary artery without angina pectoris: Secondary | ICD-10-CM | POA: Diagnosis not present

## 2014-05-03 DIAGNOSIS — J441 Chronic obstructive pulmonary disease with (acute) exacerbation: Secondary | ICD-10-CM | POA: Diagnosis not present

## 2014-05-03 DIAGNOSIS — I509 Heart failure, unspecified: Secondary | ICD-10-CM | POA: Diagnosis not present

## 2014-05-04 ENCOUNTER — Telehealth: Payer: Self-pay | Admitting: *Deleted

## 2014-05-04 NOTE — Telephone Encounter (Signed)
They look very good!

## 2014-05-04 NOTE — Telephone Encounter (Signed)
Pt reporting BP numbers since stopping amlodipine.

## 2014-05-04 NOTE — Telephone Encounter (Signed)
Patient took her bp  04/28/14 128/72  04/30/14 120/61 05/02/14 [id number] 5/19/ 116/60

## 2014-05-05 ENCOUNTER — Other Ambulatory Visit: Payer: Self-pay | Admitting: Cardiovascular Disease

## 2014-05-05 DIAGNOSIS — E119 Type 2 diabetes mellitus without complications: Secondary | ICD-10-CM | POA: Diagnosis not present

## 2014-05-05 DIAGNOSIS — I2510 Atherosclerotic heart disease of native coronary artery without angina pectoris: Secondary | ICD-10-CM | POA: Diagnosis not present

## 2014-05-05 DIAGNOSIS — J441 Chronic obstructive pulmonary disease with (acute) exacerbation: Secondary | ICD-10-CM | POA: Diagnosis not present

## 2014-05-05 DIAGNOSIS — M069 Rheumatoid arthritis, unspecified: Secondary | ICD-10-CM | POA: Diagnosis not present

## 2014-05-05 DIAGNOSIS — I509 Heart failure, unspecified: Secondary | ICD-10-CM | POA: Diagnosis not present

## 2014-05-05 DIAGNOSIS — I5033 Acute on chronic diastolic (congestive) heart failure: Secondary | ICD-10-CM | POA: Diagnosis not present

## 2014-05-05 DIAGNOSIS — IMO0001 Reserved for inherently not codable concepts without codable children: Secondary | ICD-10-CM | POA: Diagnosis not present

## 2014-05-10 DIAGNOSIS — M069 Rheumatoid arthritis, unspecified: Secondary | ICD-10-CM | POA: Diagnosis not present

## 2014-05-10 DIAGNOSIS — E119 Type 2 diabetes mellitus without complications: Secondary | ICD-10-CM | POA: Diagnosis not present

## 2014-05-10 DIAGNOSIS — I5033 Acute on chronic diastolic (congestive) heart failure: Secondary | ICD-10-CM | POA: Diagnosis not present

## 2014-05-10 DIAGNOSIS — I509 Heart failure, unspecified: Secondary | ICD-10-CM | POA: Diagnosis not present

## 2014-05-10 DIAGNOSIS — J441 Chronic obstructive pulmonary disease with (acute) exacerbation: Secondary | ICD-10-CM | POA: Diagnosis not present

## 2014-05-10 DIAGNOSIS — I2510 Atherosclerotic heart disease of native coronary artery without angina pectoris: Secondary | ICD-10-CM | POA: Diagnosis not present

## 2014-05-11 DIAGNOSIS — J441 Chronic obstructive pulmonary disease with (acute) exacerbation: Secondary | ICD-10-CM | POA: Diagnosis not present

## 2014-05-11 DIAGNOSIS — I509 Heart failure, unspecified: Secondary | ICD-10-CM | POA: Diagnosis not present

## 2014-05-11 DIAGNOSIS — E119 Type 2 diabetes mellitus without complications: Secondary | ICD-10-CM | POA: Diagnosis not present

## 2014-05-11 DIAGNOSIS — I2510 Atherosclerotic heart disease of native coronary artery without angina pectoris: Secondary | ICD-10-CM | POA: Diagnosis not present

## 2014-05-11 DIAGNOSIS — I5033 Acute on chronic diastolic (congestive) heart failure: Secondary | ICD-10-CM | POA: Diagnosis not present

## 2014-05-11 DIAGNOSIS — M069 Rheumatoid arthritis, unspecified: Secondary | ICD-10-CM | POA: Diagnosis not present

## 2014-05-12 DIAGNOSIS — IMO0001 Reserved for inherently not codable concepts without codable children: Secondary | ICD-10-CM | POA: Diagnosis not present

## 2014-05-13 DIAGNOSIS — I2510 Atherosclerotic heart disease of native coronary artery without angina pectoris: Secondary | ICD-10-CM | POA: Diagnosis not present

## 2014-05-13 DIAGNOSIS — I5033 Acute on chronic diastolic (congestive) heart failure: Secondary | ICD-10-CM | POA: Diagnosis not present

## 2014-05-13 DIAGNOSIS — I509 Heart failure, unspecified: Secondary | ICD-10-CM | POA: Diagnosis not present

## 2014-05-13 DIAGNOSIS — J441 Chronic obstructive pulmonary disease with (acute) exacerbation: Secondary | ICD-10-CM | POA: Diagnosis not present

## 2014-05-13 DIAGNOSIS — M069 Rheumatoid arthritis, unspecified: Secondary | ICD-10-CM | POA: Diagnosis not present

## 2014-05-13 DIAGNOSIS — E119 Type 2 diabetes mellitus without complications: Secondary | ICD-10-CM | POA: Diagnosis not present

## 2014-05-19 DIAGNOSIS — I5033 Acute on chronic diastolic (congestive) heart failure: Secondary | ICD-10-CM | POA: Diagnosis not present

## 2014-05-19 DIAGNOSIS — I2510 Atherosclerotic heart disease of native coronary artery without angina pectoris: Secondary | ICD-10-CM | POA: Diagnosis not present

## 2014-05-19 DIAGNOSIS — E119 Type 2 diabetes mellitus without complications: Secondary | ICD-10-CM | POA: Diagnosis not present

## 2014-05-19 DIAGNOSIS — I509 Heart failure, unspecified: Secondary | ICD-10-CM | POA: Diagnosis not present

## 2014-05-19 DIAGNOSIS — M069 Rheumatoid arthritis, unspecified: Secondary | ICD-10-CM | POA: Diagnosis not present

## 2014-05-19 DIAGNOSIS — J441 Chronic obstructive pulmonary disease with (acute) exacerbation: Secondary | ICD-10-CM | POA: Diagnosis not present

## 2014-05-31 DIAGNOSIS — I8390 Asymptomatic varicose veins of unspecified lower extremity: Secondary | ICD-10-CM | POA: Diagnosis not present

## 2014-06-23 DIAGNOSIS — E785 Hyperlipidemia, unspecified: Secondary | ICD-10-CM | POA: Diagnosis not present

## 2014-06-23 DIAGNOSIS — E119 Type 2 diabetes mellitus without complications: Secondary | ICD-10-CM | POA: Diagnosis not present

## 2014-06-28 ENCOUNTER — Encounter: Payer: Self-pay | Admitting: Cardiovascular Disease

## 2014-06-28 ENCOUNTER — Ambulatory Visit (INDEPENDENT_AMBULATORY_CARE_PROVIDER_SITE_OTHER): Payer: Medicare Other | Admitting: Cardiovascular Disease

## 2014-06-28 VITALS — BP 153/73 | HR 74 | Ht 63.0 in | Wt 137.5 lb

## 2014-06-28 DIAGNOSIS — E119 Type 2 diabetes mellitus without complications: Secondary | ICD-10-CM | POA: Diagnosis not present

## 2014-06-28 DIAGNOSIS — E785 Hyperlipidemia, unspecified: Secondary | ICD-10-CM | POA: Diagnosis not present

## 2014-06-28 DIAGNOSIS — I509 Heart failure, unspecified: Secondary | ICD-10-CM | POA: Diagnosis not present

## 2014-06-28 DIAGNOSIS — I25810 Atherosclerosis of coronary artery bypass graft(s) without angina pectoris: Secondary | ICD-10-CM

## 2014-06-28 DIAGNOSIS — I10 Essential (primary) hypertension: Secondary | ICD-10-CM | POA: Diagnosis not present

## 2014-06-28 DIAGNOSIS — I5032 Chronic diastolic (congestive) heart failure: Secondary | ICD-10-CM | POA: Diagnosis not present

## 2014-06-28 NOTE — Progress Notes (Signed)
Patient ID: Donna Lane, female    DOB: [birth year], 80 y.o.   MRN: [id number]  HPI Comments: Donna Lane is a pleasant 80 year old woman with history of coronary artery disease, catheterization August 25, 2007 showing severe three-vessel disease, with bypass surgery at [facility] September 2008 also with severe peripheral vascular disease, bilateral renal artery stenosis, carotid disease, iliac disease who presents  for routine visit.  In followup today, she reports that she is doing well. She is playing golf on a regular basis. She takes Lasix 40 mg twice a day. Minimal ankle swelling. She does during significant fluids daily Blood pressure at home was generally well-controlled  previous episodes of chest pain  resolved by decreasing the dose of her aspirin from 325 mg daily to 81 mg daily.  Prior pain in the subxiphoid region with occasional nausea and vomiting of green bilious liquid. She attributes her discomfort to taking meloxicam which was initiated for possible gout.    hospital admission 04/17/2014  to 04/19/2014 with COPD exacerbation, acute on chronic diastolic CHF She was given IV Lasix with worsening renal dysfunction also given IV steroids, nebulizers with good improvement of her shortness of breath Baseline creatinine before diuresis was 1.25. After diuresis was 1.96  Carotid ultrasound in the November 2012 has shown 40-59% bilateral carotid disease.  LE u/s 11/2011 suggest  50% distal aorta disease, right common iliac artery with equal or less than 50% disease, left common iliac with equal or greater than 60% disease. no severe renal artery stenosis  EKG showing normal sinus rhythm with a rate of 74  beats minute, no significant ST or T wave changes  Outpatient Encounter Prescriptions as of 06/28/2014  Medication Sig  . aspirin 81 MG tablet Take 2 tablets (162 mg total) by mouth daily.  . furosemide (LASIX) 40 MG tablet Take 1 tablet (40 mg total) by mouth 2  (two) times daily as needed.  Marland Kitchen glipiZIDE (GLUCOTROL) 5 MG tablet Take 5 mg by mouth daily before breakfast.   . hydrALAZINE (APRESOLINE) 25 MG tablet Take 25 mg by mouth 3 (three) times daily.   . isosorbide mononitrate (IMDUR) 60 MG 24 hr tablet TAKE ONE-HALF TABLET BY MOUTH ONCE DAILY  . meloxicam (MOBIC) 7.5 MG tablet Take 7.5 mg by mouth 2 (two) times daily.   . nitroGLYCERIN (NITROSTAT) 0.4 MG SL tablet Place 0.4 mg under the tongue every 5 (five) minutes as needed.    . pantoprazole (PROTONIX) 40 MG tablet Take 40 mg by mouth 2 (two) times daily.   . polyethylene glycol (MIRALAX / GLYCOLAX) packet Take 17 g by mouth once a week.  . Probiotic Product (HEALTHY COLON PO) Take by mouth as needed.  . simvastatin (ZOCOR) 20 MG tablet TAKE ONE TABLET BY MOUTH ONCE DAILY    Review of Systems  Constitutional: Negative.   HENT: Negative.   Eyes: Negative.   Respiratory: Negative.   Cardiovascular: Negative.   Gastrointestinal: Negative.   Endocrine: Negative.   Musculoskeletal: Positive for gait problem.  Skin: Negative.   Allergic/Immunologic: Negative.   Neurological: Negative.   Hematological: Negative.   Psychiatric/Behavioral: Negative.   All other systems reviewed and are negative.  BP 153/73  Pulse 74  Ht 5\' 3"  (1.6 m)  Wt 137 lb 8 oz (62.37 kg)  BMI 24.36 kg/m2  Physical Exam  Nursing note and vitals reviewed. Constitutional: She is oriented to person, place, and time. She appears well-developed and well-nourished.  HENT:  Head: Normocephalic.  Nose: Nose normal.  Mouth/Throat: Oropharynx is clear and moist.  Eyes: Conjunctivae are normal. Pupils are equal, round, and reactive to light.  Neck: Normal range of motion. Neck supple. No JVD present.  Cardiovascular: Normal rate, regular rhythm, S1 normal, S2 normal and intact distal pulses.  Exam reveals no gallop and no friction rub.   Murmur heard.  Crescendo systolic murmur is present with a grade of 2/6   Pulmonary/Chest: Effort normal and breath sounds normal. No respiratory distress. She has no wheezes. She has no rales. She exhibits no tenderness.  Mildly decreased breath sounds bilaterally throughout.  Abdominal: Soft. Bowel sounds are normal. She exhibits no distension. There is no tenderness.  Musculoskeletal: Normal range of motion. She exhibits no edema and no tenderness.  Lymphadenopathy:    She has no cervical adenopathy.  Neurological: She is alert and oriented to person, place, and time. Coordination normal.  Skin: Skin is warm and dry. No rash noted. No erythema.  Psychiatric: She has a normal mood and affect. Her behavior is normal. Judgment and thought content normal.    Assessment and Plan

## 2014-06-28 NOTE — Assessment & Plan Note (Signed)
Recheck of her blood pressure showed improvement. She reports excellent blood pressure control at home. No medication changes made

## 2014-06-28 NOTE — Assessment & Plan Note (Signed)
Encouraged her to stay on her simvastatin. Goal LDL less than 70

## 2014-06-28 NOTE — Assessment & Plan Note (Signed)
We have encouraged continued exercise, careful diet management   

## 2014-06-28 NOTE — Assessment & Plan Note (Signed)
Appears euvolemic on Lasix 40 mg twice a day. No changes to her medications

## 2014-06-28 NOTE — Patient Instructions (Signed)
You are doing well. No medication changes were made.  Please call us if you have new issues that need to be addressed before your next appt.  Your physician wants you to follow-up in: 6 months.  You will receive a reminder letter in the mail two months in advance. If you don't receive a letter, please call our office to schedule the follow-up appointment.   

## 2014-07-13 ENCOUNTER — Ambulatory Visit: Payer: Self-pay | Admitting: Podiatry

## 2014-08-08 DIAGNOSIS — R42 Dizziness and giddiness: Secondary | ICD-10-CM | POA: Diagnosis not present

## 2014-08-08 DIAGNOSIS — M1990 Unspecified osteoarthritis, unspecified site: Secondary | ICD-10-CM | POA: Diagnosis not present

## 2014-08-17 ENCOUNTER — Ambulatory Visit (INDEPENDENT_AMBULATORY_CARE_PROVIDER_SITE_OTHER): Payer: Medicare Other | Admitting: Podiatry

## 2014-08-17 ENCOUNTER — Ambulatory Visit: Payer: Self-pay

## 2014-08-17 ENCOUNTER — Encounter: Payer: Self-pay | Admitting: Podiatry

## 2014-08-17 VITALS — BP 120/65 | HR 70 | Resp 18 | Ht 62.0 in | Wt 130.0 lb

## 2014-08-17 DIAGNOSIS — M79609 Pain in unspecified limb: Secondary | ICD-10-CM | POA: Diagnosis not present

## 2014-08-17 DIAGNOSIS — B351 Tinea unguium: Secondary | ICD-10-CM | POA: Diagnosis not present

## 2014-08-17 DIAGNOSIS — M79606 Pain in leg, unspecified: Secondary | ICD-10-CM

## 2014-08-17 DIAGNOSIS — E119 Type 2 diabetes mellitus without complications: Secondary | ICD-10-CM

## 2014-08-17 DIAGNOSIS — I25810 Atherosclerosis of coronary artery bypass graft(s) without angina pectoris: Secondary | ICD-10-CM

## 2014-08-17 NOTE — Progress Notes (Signed)
   Subjective:    Patient ID: Donna Lane, female    DOB: 09/02/1925, 80 y.o.   MRN: [id number]  HPI Comments: Would like to have my toenails trimmed , my husband is afraid to cut them . Diabetic for years, blood sugar this morning was 66   Diabetes Associated symptoms include weakness.      Review of Systems  Constitutional:       Fatigue  Weight changes   HENT: Positive for hearing loss.   Eyes: Positive for redness.  Respiratory: Positive for shortness of breath.   Endocrine:       Diabetic   Genitourinary: Positive for frequency.  Musculoskeletal:       Joint pain  Back pain  Difficulty walking   Neurological: Positive for weakness.  Hematological: Bruises/bleeds easily.  All other systems reviewed and are negative.      Objective:   Physical Exam: I have reviewed her past medical history medications allergies surgeries social history and review of systems. Pulses are palpable bilateral. Neurologic sensorium is intact per since once the monofilament. Deep to reflexes are intact bilateral muscle strength is 5 over 5 dorsiflexors plantar flexors inverters everters all intrinsic musculature is intact. Orthopedic evaluation demonstrates all joints distal to the ankle a full range of motion without crepitation. Cutaneous evaluation demonstrates supple well hydrated cutis no erythema edema cellulitis drainage or odor. Nails are thick yellow dystrophic onychomycotic and painful on palpation.        Assessment & Plan:  Assessment: Pain in limb secondary to onychomycosis 1 through 5 bilateral.  Plan: Debridement of nails 1 through 5 bilateral.

## 2014-09-08 DIAGNOSIS — E782 Mixed hyperlipidemia: Secondary | ICD-10-CM | POA: Diagnosis not present

## 2014-09-08 DIAGNOSIS — E785 Hyperlipidemia, unspecified: Secondary | ICD-10-CM | POA: Diagnosis not present

## 2014-09-08 DIAGNOSIS — T887XXA Unspecified adverse effect of drug or medicament, initial encounter: Secondary | ICD-10-CM | POA: Diagnosis not present

## 2014-09-08 DIAGNOSIS — I10 Essential (primary) hypertension: Secondary | ICD-10-CM | POA: Diagnosis not present

## 2014-09-08 DIAGNOSIS — M47817 Spondylosis without myelopathy or radiculopathy, lumbosacral region: Secondary | ICD-10-CM | POA: Diagnosis not present

## 2014-09-08 DIAGNOSIS — M51379 Other intervertebral disc degeneration, lumbosacral region without mention of lumbar back pain or lower extremity pain: Secondary | ICD-10-CM | POA: Diagnosis not present

## 2014-09-08 DIAGNOSIS — M5137 Other intervertebral disc degeneration, lumbosacral region: Secondary | ICD-10-CM | POA: Diagnosis not present

## 2014-09-08 DIAGNOSIS — M5126 Other intervertebral disc displacement, lumbar region: Secondary | ICD-10-CM | POA: Diagnosis not present

## 2014-09-12 DIAGNOSIS — Z23 Encounter for immunization: Secondary | ICD-10-CM | POA: Diagnosis not present

## 2014-09-20 DIAGNOSIS — M5126 Other intervertebral disc displacement, lumbar region: Secondary | ICD-10-CM | POA: Diagnosis not present

## 2014-11-16 ENCOUNTER — Ambulatory Visit (INDEPENDENT_AMBULATORY_CARE_PROVIDER_SITE_OTHER): Payer: Medicare Other | Admitting: Podiatry

## 2014-11-16 DIAGNOSIS — B351 Tinea unguium: Secondary | ICD-10-CM

## 2014-11-16 DIAGNOSIS — M79676 Pain in unspecified toe(s): Secondary | ICD-10-CM | POA: Diagnosis not present

## 2014-11-16 NOTE — Progress Notes (Signed)
Presents today chief complaint of painful elongated toenails.  Objective: Pulses are palpable bilateral nails are thick, yellow dystrophic onychomycosis and painful palpation.   Assessment: Onychomycosis with pain in limb.  Plan: Treatment of nails in thickness and length as covered service secondary to pain.  

## 2014-11-23 ENCOUNTER — Other Ambulatory Visit: Payer: Self-pay | Admitting: Cardiovascular Disease

## 2014-12-27 ENCOUNTER — Ambulatory Visit: Payer: PRIVATE HEALTH INSURANCE | Admitting: Cardiovascular Disease

## 2014-12-30 ENCOUNTER — Other Ambulatory Visit: Payer: Self-pay | Admitting: Cardiovascular Disease

## 2015-01-10 ENCOUNTER — Encounter: Payer: Self-pay | Admitting: Cardiovascular Disease

## 2015-01-10 ENCOUNTER — Ambulatory Visit (INDEPENDENT_AMBULATORY_CARE_PROVIDER_SITE_OTHER): Payer: Medicare Other | Admitting: Cardiovascular Disease

## 2015-01-10 VITALS — BP 110/62 | HR 70 | Ht 64.0 in | Wt 131.2 lb

## 2015-01-10 DIAGNOSIS — E785 Hyperlipidemia, unspecified: Secondary | ICD-10-CM

## 2015-01-10 DIAGNOSIS — I10 Essential (primary) hypertension: Secondary | ICD-10-CM | POA: Diagnosis not present

## 2015-01-10 DIAGNOSIS — I25718 Atherosclerosis of autologous vein coronary artery bypass graft(s) with other forms of angina pectoris: Secondary | ICD-10-CM

## 2015-01-10 DIAGNOSIS — I739 Peripheral vascular disease, unspecified: Secondary | ICD-10-CM | POA: Diagnosis not present

## 2015-01-10 DIAGNOSIS — E118 Type 2 diabetes mellitus with unspecified complications: Secondary | ICD-10-CM | POA: Diagnosis not present

## 2015-01-10 NOTE — Progress Notes (Signed)
Patient ID: Donna Lane, female    DOB: 1925/08/24, 81 y.o.   MRN: [id number]  HPI Comments: Donna Lane is a pleasant 81 year old woman with history of coronary artery disease, catheterization August 25, 2007 showing severe three-vessel disease, with bypass surgery at [facility] September 2008 also with severe peripheral vascular disease, bilateral renal artery stenosis, carotid disease, iliac disease who presents  for follow-up of her coronary artery disease.  In followup today, she reports that she is doing well.  Minimal ankle swelling. More sedentary recently, not playing as much golf secondary to the weather Blood pressure at home was generally well-controlled She is bothered that she is unable to obtain her glucose test strips Occasionally takes insulin for sugar above 150  EKG on today's visit shows normal sinus rhythm with rate 70 bpm, no significant ST or T-wave changes  Other past medical history previous episodes of chest pain  resolved by decreasing the dose of her aspirin from 325 mg daily to 81 mg daily.  Prior pain in the subxiphoid region with occasional nausea and vomiting of green bilious liquid. She attributes her discomfort to taking meloxicam which was initiated for possible gout.    hospital admission 04/17/2014  to 04/19/2014 with COPD exacerbation, acute on chronic diastolic CHF She was given IV Lasix with worsening renal dysfunction also given IV steroids, nebulizers with good improvement of her shortness of breath Baseline creatinine before diuresis was 1.25. After diuresis was 1.96  Carotid ultrasound in the November 2012 has shown 40-59% bilateral carotid disease.  LE u/s 11/2011 suggest  50% distal aorta disease, right common iliac artery with equal or less than 50% disease, left common iliac with equal or greater than 60% disease. no severe renal artery stenosis  No Known Allergies  Outpatient Encounter Prescriptions as of 01/10/2015   Medication Sig  . aspirin 81 MG tablet Take 2 tablets (162 mg total) by mouth daily.  . furosemide (LASIX) 40 MG tablet Take 1 tablet (40 mg total) by mouth 2 (two) times daily as needed.  Marland Kitchen glipiZIDE (GLUCOTROL) 5 MG tablet Take 5 mg by mouth daily before breakfast.   . hydrALAZINE (APRESOLINE) 25 MG tablet Take 25 mg by mouth 3 (three) times daily.   . Insulin Detemir (LEVEMIR) 100 UNIT/ML Pen Inject 100 Units into the skin daily at 10 pm.  . isosorbide mononitrate (IMDUR) 60 MG 24 hr tablet TAKE ONE-HALF TABLET BY MOUTH ONCE DAILY  . meloxicam (MOBIC) 7.5 MG tablet Take 7.5 mg by mouth 2 (two) times daily.   . nitroGLYCERIN (NITROSTAT) 0.4 MG SL tablet Place 0.4 mg under the tongue every 5 (five) minutes as needed.    . pantoprazole (PROTONIX) 40 MG tablet Take 40 mg by mouth 2 (two) times daily.   . polyethylene glycol (MIRALAX / GLYCOLAX) packet Take 17 g by mouth once a week.  . Probiotic Product (HEALTHY COLON PO) Take by mouth as needed.  . simvastatin (ZOCOR) 20 MG tablet TAKE ONE TABLET BY MOUTH ONCE DAILY    Past Medical History  Diagnosis Date  . COPD (chronic obstructive pulmonary disease)   . PVD (peripheral vascular disease)   . Diabetes mellitus   . Hypertension   . Hyperlipidemia   . Coronary artery disease 2008    CABG x 4,   . Chronic diastolic heart failure     with pulmonary HTN  . Chronic kidney disease     Past Surgical History  Procedure Laterality Date  .  Cardiac catheterization      PTCA x 3  . Coronary artery bypass graft  2008    x 4  . Tonsillectomy      Social History  reports that she quit smoking about 7 years ago. Her smoking use included Cigarettes. She has a 50 pack-year smoking history. She does not have any smokeless tobacco history on file. She reports that she does not drink alcohol or use illicit drugs.  Family History family history includes Heart disease in her father and sister.   Review of Systems  Constitutional: Negative.    Respiratory: Negative.   Cardiovascular: Negative.   Gastrointestinal: Negative.   Musculoskeletal: Positive for gait problem.  Skin: Negative.   Allergic/Immunologic: Negative.   Neurological: Negative.   Hematological: Negative.   Psychiatric/Behavioral: Negative.   All other systems reviewed and are negative.  BP 110/62 mmHg  Pulse 70  Ht 5\' 4"  (1.626 m)  Wt 131 lb 4 oz (59.535 kg)  BMI 22.52 kg/m2  Physical Exam  Constitutional: She is oriented to person, place, and time. She appears well-developed and well-nourished.  HENT:  Head: Normocephalic.  Nose: Nose normal.  Mouth/Throat: Oropharynx is clear and moist.  Eyes: Conjunctivae are normal. Pupils are equal, round, and reactive to light.  Neck: Normal range of motion. Neck supple. No JVD present.  Cardiovascular: Normal rate, regular rhythm, S1 normal, S2 normal and intact distal pulses.  Exam reveals no gallop and no friction rub.   Murmur heard.  Crescendo systolic murmur is present with a grade of 2/6  Pulmonary/Chest: Effort normal and breath sounds normal. No respiratory distress. She has no wheezes. She has no rales. She exhibits no tenderness.  Mildly decreased breath sounds bilaterally throughout.  Abdominal: Soft. Bowel sounds are normal. She exhibits no distension. There is no tenderness.  Musculoskeletal: Normal range of motion. She exhibits no edema or tenderness.  Lymphadenopathy:    She has no cervical adenopathy.  Neurological: She is alert and oriented to person, place, and time. Coordination normal.  Skin: Skin is warm and dry. No rash noted. No erythema.  Psychiatric: She has a normal mood and affect. Her behavior is normal. Judgment and thought content normal.    Assessment and Plan  Nursing note and vitals reviewed.

## 2015-01-10 NOTE — Assessment & Plan Note (Signed)
Managed by primary care. She takes insulin for sugar over 150

## 2015-01-10 NOTE — Assessment & Plan Note (Signed)
Currently with no symptoms of angina. No further workup at this time. Continue current medication regimen. 

## 2015-01-10 NOTE — Assessment & Plan Note (Signed)
Blood pressure is well controlled on today's visit. No changes made to the medications. 

## 2015-01-10 NOTE — Assessment & Plan Note (Signed)
In 2013, Moderate disease/stenosis of the bilateral iliacs

## 2015-01-10 NOTE — Patient Instructions (Signed)
You are doing well. No medication changes were made.  Please call us if you have new issues that need to be addressed before your next appt.  Your physician wants you to follow-up in: 6 months.  You will receive a reminder letter in the mail two months in advance. If you don't receive a letter, please call our office to schedule the follow-up appointment.   

## 2015-01-10 NOTE — Assessment & Plan Note (Signed)
Cholesterol is at goal on the current lipid regimen. No changes to the medications were made.  

## 2015-01-24 DIAGNOSIS — I10 Essential (primary) hypertension: Secondary | ICD-10-CM | POA: Diagnosis not present

## 2015-01-24 DIAGNOSIS — E785 Hyperlipidemia, unspecified: Secondary | ICD-10-CM | POA: Diagnosis not present

## 2015-01-31 ENCOUNTER — Telehealth: Payer: Self-pay | Admitting: *Deleted

## 2015-01-31 NOTE — Telephone Encounter (Signed)
Pt called asking about a bill she got from her insurance company, she was upset and I stated to her that I would look into her account. I did not see anything I suggested to patient that she call her insurance company. For pt did not understand what that "letter or bill" was.  She then said she pays a good amount and should be even receive a bill. I suggested that she may need to call them first for it may not even be from our office.  She understood but was upset and then hung up.

## 2015-02-17 DIAGNOSIS — S62632A Displaced fracture of distal phalanx of right middle finger, initial encounter for closed fracture: Secondary | ICD-10-CM | POA: Diagnosis not present

## 2015-02-17 DIAGNOSIS — S62663B Nondisplaced fracture of distal phalanx of left middle finger, initial encounter for open fracture: Secondary | ICD-10-CM | POA: Diagnosis not present

## 2015-02-17 DIAGNOSIS — Z23 Encounter for immunization: Secondary | ICD-10-CM | POA: Diagnosis not present

## 2015-02-17 DIAGNOSIS — S61219A Laceration without foreign body of unspecified finger without damage to nail, initial encounter: Secondary | ICD-10-CM | POA: Diagnosis not present

## 2015-02-20 ENCOUNTER — Other Ambulatory Visit: Payer: Medicare Other

## 2015-02-21 ENCOUNTER — Ambulatory Visit (INDEPENDENT_AMBULATORY_CARE_PROVIDER_SITE_OTHER): Payer: Medicare Other | Admitting: Podiatry

## 2015-02-21 DIAGNOSIS — M79676 Pain in unspecified toe(s): Secondary | ICD-10-CM | POA: Diagnosis not present

## 2015-02-21 DIAGNOSIS — B351 Tinea unguium: Secondary | ICD-10-CM

## 2015-02-21 NOTE — Progress Notes (Signed)
Patient ID: Donna Lane, female   DOB: January 19, 1925, 81 y.o.   MRN: [id number]  Subjective: 81 y.o.-year-old female returns the office today for painful, elongated, thickened toenails. Denies any redness or drainage around the nails. Denies any acute changes since last appointment and no new complaints today. Denies any systemic complaints such as fevers, chills, nausea, vomiting.   Objective: AAO 3, NAD DP/PT pulses palpable, CRT less than 3 seconds Protective sensation intact with Simms Weinstein monofilament, Achilles tendon reflex intact.  Nails hypertrophic, dystrophic, elongated, brittle, discolored 9. There is tenderness over nails 1-5 bilaterally except the left hallux. The left hallux nail has previously been removed. There is mild incurvation of the nails. There is no surrounding erythema or drainage along the nail sites. No open lesions or pre-ulcerative lesions are identified. No other areas of tenderness bilateral lower extremities. No overlying edema, erythema, increased warmth. No pain with calf compression, swelling, warmth, erythema.  Assessment: Patient presents with symptomatic onychomycosis  Plan: -Treatment options including alternatives, risks, complications were discussed -Nails sharply debrided x 9 without complication/bleeding. -Discussed daily foot inspection. If there are any changes, to call the office immediately.  -Follow-up in 3 months or sooner if any problems are to arise. In the meantime, encouraged to call the office with any questions, concerns, changes symptoms.

## 2015-02-21 NOTE — Patient Instructions (Signed)
Diabetes and Foot Care Diabetes may cause you to have problems because of poor blood supply (circulation) to your feet and legs. This may cause the skin on your feet to become thinner, break easier, and heal more slowly. Your skin may become dry, and the skin may peel and crack. You may also have nerve damage in your legs and feet causing decreased feeling in them. You may not notice minor injuries to your feet that could lead to infections or more serious problems. Taking care of your feet is one of the most important things you can do for yourself.  HOME CARE INSTRUCTIONS  Wear shoes at all times, even in the house. Do not go barefoot. Bare feet are easily injured.  Check your feet daily for blisters, cuts, and redness. If you cannot see the bottom of your feet, use a mirror or ask someone for help.  Wash your feet with warm water (do not use hot water) and mild soap. Then pat your feet and the areas between your toes until they are completely dry. Do not soak your feet as this can dry your skin.  Apply a moisturizing lotion or petroleum jelly (that does not contain alcohol and is unscented) to the skin on your feet and to dry, brittle toenails. Do not apply lotion between your toes.  Trim your toenails straight across. Do not dig under them or around the cuticle. File the edges of your nails with an emery board or nail file.  Do not cut corns or calluses or try to remove them with medicine.  Wear clean socks or stockings every day. Make sure they are not too tight. Do not wear knee-high stockings since they may decrease blood flow to your legs.  Wear shoes that fit properly and have enough cushioning. To break in new shoes, wear them for just a few hours a day. This prevents you from injuring your feet. Always look in your shoes before you put them on to be sure there are no objects inside.  Do not cross your legs. This may decrease the blood flow to your feet.  If you find a minor scrape,  cut, or break in the skin on your feet, keep it and the skin around it clean and dry. These areas may be cleansed with mild soap and water. Do not cleanse the area with peroxide, alcohol, or iodine.  When you remove an adhesive bandage, be sure not to damage the skin around it.  If you have a wound, look at it several times a day to make sure it is healing.  Do not use heating pads or hot water bottles. They may burn your skin. If you have lost feeling in your feet or legs, you may not know it is happening until it is too late.  Make sure your health care provider performs a complete foot exam at least annually or more often if you have foot problems. Report any cuts, sores, or bruises to your health care provider immediately. SEEK MEDICAL CARE IF:   You have an injury that is not healing.  You have cuts or breaks in the skin.  You have an ingrown nail.  You notice redness on your legs or feet.  You feel burning or tingling in your legs or feet.  You have pain or cramps in your legs and feet.  Your legs or feet are numb.  Your feet always feel cold. SEEK IMMEDIATE MEDICAL CARE IF:   There is increasing redness,   swelling, or pain in or around a wound.  There is a red line that goes up your leg.  Pus is coming from a wound.  You develop a fever or as directed by your health care provider.  You notice a bad smell coming from an ulcer or wound. Document Released: 11/29/2000 Document Revised: 08/04/2013 Document Reviewed: 05/11/2013 ExitCare Patient Information 2015 [employer]. This information is not intended to replace advice given to you by your health care provider. Make sure you discuss any questions you have with your health care provider.  

## 2015-03-31 DIAGNOSIS — J301 Allergic rhinitis due to pollen: Secondary | ICD-10-CM | POA: Diagnosis not present

## 2015-04-04 NOTE — Discharge Summary (Signed)
PATIENT NAME:  Donna Lane, Donna Lane MR#:  [id number] DATE OF BIRTH:  1925-08-29  DATE OF ADMISSION:  06/27/2012 DATE OF DISCHARGE:  06/28/2012  DISCHARGE DIAGNOSES:  1. Chest pain possibly secondary to anxiety and gastroesophageal reflux disease.  2. Hypertension.  3. Coronary artery disease.  4. Diabetes mellitus, type II.   DISCHARGE MEDICATIONS:  1. Glipizide 5 mg p.o. daily. 2. Simvastatin 20 mg daily. 3. Metoprolol 50 mg p.o. b.i.d.  4. Aspirin 325 mg once a day. 5. Metformin 500 mg 2 tablets p.o. b.i.d.  6. Zetia 10 mg p.o. daily.  7. Meloxicam 7.5 mg p.o. b.i.d.  8. Isosorbide mononitrate 60 mg p.o. daily.  9. Lasix 40 mg p.o. daily.  10. Amlodipine 5 mg p.o. daily.   DIET: Low sodium, ADA diet.   FOLLOW-UP: Follow-up with the patient's cardiologist on Monday.   CONSULTATION: Cardiology consult with Dr. Fletcher Anon.   HOSPITAL COURSE: The patient is an 81 year old female with history of coronary artery disease, triple vessel disease at [facility] in 2008 who came in because of chest pain. The patient had a history of peripheral vascular disease, renal artery stenosis, coronary artery disease, and history of CABG. The patient usually follows up with Dr. [name]. On the day of admission she had epigastric pain in her lower substernal region. Because of her heart history, she came to the Emergency Room and was placed on observation for chest pain rule out. Troponins have been negative. The patient's EKG showed changes which were not new. Dr. Fletcher Anon from Cardiology saw the patient and said that since troponins have been negative she can have an exercise stress test as an outpatient with Dr. Ida Rogue. The patient denied anymore chest pain the following day and was continued on home medications without any other change. I wrote prescription for sublingual nitro because the one that she has has expired. I wrote her a prescription for sublingual nitroglycerin 0.4 mg every five minutes up to  three doses. If there is no relief, she is to call 9-1-1.  TIME SPENT ON DISCHARGE PREPARATION: More than 30 minutes.   ____________________________ Epifanio Lesches, MD sk:drc D: 06/30/2012 23:14:47 ET T: 07/01/2012 12:22:46 ET JOB#: [id number]  cc: Epifanio Lesches, MD, <Dictator> Epifanio Lesches MD ELECTRONICALLY SIGNED 07/09/2012 23:09

## 2015-04-08 NOTE — Consult Note (Signed)
PATIENT NAME:  Donna Lane, Donna Lane MR#:  [id number] DATE OF BIRTH:  July 13, 1925  DATE OF CONSULTATION:  04/18/2014  CONSULTING PHYSICIAN:  Macalister Arnaud A. Fletcher Anon, MD  PRIMARY CARE PHYSICIAN: Dr. Hardin Negus.  PRIMARY CARDIOLOGIST: Dr. [name].  REQUESTING PHYSICIAN: Dr. Anselm Jungling.  REASON FOR CONSULTATION: Chest pain and congestive heart failure.   HISTORY OF PRESENT ILLNESS: This is a pleasant 81 year old female with known history of COPD due to remote tobacco use, coronary artery disease, status post CABG in 2008 for 3-vessel coronary artery disease, chronic diastolic heart failure, peripheral arterial disease and hypertension. She presented on 05/02 with worsening dyspnea and hypoxia. She was noted to be fluid overloaded and was started on Lasix 80 mg IV. She was hospitalized in March for heart failure with a similar presentation. However, she had worsening renal function with diuresis and was discharged off diuretics. Subsequently, Lasix was resumed at 20 mg once daily. In spite of that, she had worsening dyspnea and fluid overload. The patient was again diuresed during this admission. However, kidney function again worsened and the creatinine climbed to 1.9. She was started on gentle hydration today. She had an episode of substernal chest aching sensation with heartburn earlier today. Troponin has been negative. ECG showed anterior T wave changes which are not new. She is currently feeling better. She was supposed to get renal artery duplex ultrasound on that, in our office to evaluate renal artery stenosis, but that has not been done yet.   PAST MEDICAL HISTORY:  1. COPD.  2. Chronic diastolic heart failure with pulmonary hypertension.  3. Type 2 diabetes.  4. Chronic kidney disease.  5. Peripheral arterial disease with bilateral iliac artery stenosis. Renal artery stenosis is mentioned in prior history, but renal artery duplex in 2013 showed no significant stenosis.  6. Coronary artery disease, status  post CABG. Most recent echocardiogram in March showed ejection fraction of 60% to 65%.   HOME MEDICATIONS: Include:  1. Simvastatin 20 mg daily.  2. ProAir.  3. Metoprolol tartrate 50 mg twice daily.  4. Meloxicam 7.5 mg 3 times daily.  5. Imdur 30 mg once daily.  6. Glipizide 5 mg once daily.  7. Aspirin 81 mg once daily.  8. Amlodipine 5 mg daily. 9. Fluticasone nasal spray once daily.   ALLERGIES: INCLUDE SILVADENE.   FAMILY HISTORY: Family history is remarkable for coronary artery disease.   SOCIAL HISTORY: Negative for smoking. She quit 8 years ago. She is married and lives with her husband.   REVIEW OF SYSTEMS: A 10-point review of systems was performed. It is negative other than what is mentioned in the HPI.   PHYSICAL EXAMINATION:  GENERAL: The patient appears to be younger than her stated age and currently in no acute distress.  VITAL SIGNS: Temperature is 98.1, pulse is 98, respiratory rate is 19, blood pressure is 123/68, and oxygen saturation is 97% on room air.  HEENT: Normocephalic, atraumatic.  NECK: No JVD or carotid bruits.  RESPIRATORY: Normal respiratory effort with no use of accessory muscles. Auscultation reveals normal breath sounds.  CARDIOVASCULAR: Normal PMI. Normal S1 and S2 with no gallops or murmurs.  ABDOMEN: Benign, nontender, and nondistended.  EXTREMITIES: Trace edema bilaterally.  SKIN: Warm and dry with no rash.  PSYCHIATRIC: She is alert, oriented x 3 with normal mood and affect.   LABORATORY AND DIAGNOSTIC DATA: ECG showed normal sinus rhythm with anterior T wave changes which is not different from prior EKG.   Troponin has been negative. Creatinine on  presentation was 1.25 and increased to 1.96. White cell count was 13.6. BNP on presentation was 2241.   IMPRESSION:  1. Acute on chronic diastolic heart failure.  2. Acute on chronic kidney disease likely due overdiuresis.  3. Chest pain. Overall atypical, likely due to gastroesophageal reflux  disease.  4. Coronary artery disease, status post coronary artery bypass graft.   RECOMMENDATIONS: The patient has been having recurrent presentation with acute on chronic diastolic heart failure which tends to get complicated by renal failure very quickly with diuresis. The etiology of this is not entirely clear. I agree that we have to rule out renal artery stenosis as a culprit. She is supposed to get renal artery duplex ultrasound in our office and this has to be rescheduled after her discharge. In the meanwhile, I agree with gentle hydration for now and resuming small dose diuretic before hospital discharge. Regarding her chest pain, it is overall atypical in nature. There has been some association with food and the discomfort is described as heartburn. Recent abdominal ultrasound showed no gallbladder disease. I started the patient on Protonix 40 mg twice daily. If cardiac enzymes remain negative, I do not advise further ischemic evaluation.    ____________________________ Mertie Clause Fletcher Anon, MD maa:lt D: 04/18/2014 20:00:42 ET T: 04/18/2014 23:28:41 ET JOB#: [id number]  cc: Rogue Jury A. Fletcher Anon, MD, <Dictator> Morton Peters., MD Minna Merritts, MD Ceasar Lund Anselm Jungling, MD Rogue Jury Ferne Reus MD ELECTRONICALLY SIGNED 04/27/2014 22:40

## 2015-04-08 NOTE — Discharge Summary (Signed)
PATIENT NAME:  Donna Lane, Donna Lane MR#:  [id number] DATE OF BIRTH:  09-30-1925  DATE OF ADMISSION:  03/11/2014 DATE OF DISCHARGE:  03/14/2014  ADMITTING DIAGNOSIS: Shortness of breath.   DISCHARGE DIAGNOSES:  1.  Shortness of breath felt to be due to acute diastolic congestive heart failure and possible pneumonia, now the patient's symptoms improved.  2.  Chronic obstructive pulmonary disease without evidence of acute exacerbation.  3.  Hypertension.  4.  Hyperlipidemia.  5.  Type 2 diabetes.  6.  Likely some chronic renal insufficiency with creatinine being slightly elevated. Needs outpatient BMP and a follow-up.  7.  Hyperlipidemia.  8.  Pulmonary hypertension associated with tricuspid and mitral valve disease noted on echocardiogram.  9.  Moderate to severe bilateral renal artery stenosis, 80% bilaterally. 10. 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 11. Anemia of chronic disease. 12. Left hip surgery.  CONSULTANTS: [facility].   PERTINENT LABS AND EVALUATIONS: Admitting EKG showed normal sinus rhythm with nonspecific ST-T wave changes. Chest x-ray showed left basilar density suggestive of pleural fluid with atelectasis or mild consolidation. Troponin less than 0.02. Glucose 185, BUN 20, creatinine 1.58, sodium 135, potassium 4.2, chloride 101, CO2 is 30, calcium is 9.1. WBC 9.0, hemoglobin 12.3, platelet count is 163. BNP was 221. D-dimer was 1436. Echocardiogram of the heart showed a moderate sized left pleural effusion, left ventricular EF 53% to 29%, diastolic relaxation abnormality noted, mildly dilated left atrium, mild to moderate mitral valve regurg, mild tricuspid regurg and moderately elevated pulmonary artery systolic pressures. Most recent creatinine today, on March 30, BUN 30, creatinine 1.94.   HOSPITAL COURSE: Please refer to H and P done by the admitting physician. The patient is an 81 year old white female with previous history of coronary  artery disease, peripheral arterial disease, hypertension, hyperlipidemia and COPD, who presented with complaint of shortness of breath for a few days. She was evaluated in the ED and thought to have a possible CHF. The chest x-ray also suggested a possible pneumonia. The patient was treated and given diuretics. With these interventions, her shortness of breath improved significantly. However, her creatinine did increase a little bit. Her Lasix has been held and it will be held on discharge. She will need a followup BMP to make sure the creatinine has not continued to be elevated. She does have a history of renal artery stenosis. This needs to be followed up as an outpatient. The patient  was continued on the Lasix. With an increase in her creatinine, Lasix was stopped. She was seen in consultation by cardiology and recommended continuing monitoring patient. At this point, she is doing much better and is stable for discharge to home with outpatient follow-up and closely monitor renal function. The patient also may benefit from referral to nephrology to monitor renal function. At this time, she is stable for discharge. Discharge instructions for CHF given.  DISCHARGE MEDICATIONS:  Meloxicam 7.5, 1 tab p.o. b.i.d., amlodipine 5 daily, aspirin 81, 1 tab p.o. daily, glipizide 5 mg daily, isosorbide mononitrate 30 mg daily, metoprolol tartrate 50, 1 tab p.o. b.i.d., simvastatin 20 at bedtime, ProAir 2 puffs q.6 hours p.r.n., levofloxacin 500, 1 tab p.o. q. 48 hours x 4 days and fluticasone 2 sprays daily.   DIET: Low sodium, carbohydrate consistent.   ACTIVITY: As tolerated.   FOLLOWUP: With primary M.D. this Thursday for a BMP check. They need to monitor her renal function. Follow with Dr. [name] in 2 to  4 weeks.    TIME SPENT: 35 minutes.   ____________________________ Lafonda Mosses Posey Pronto, MD shp:aw D: 03/15/2014 09:29:53 ET T: 03/15/2014 10:47:15 ET JOB#: [id number]  cc: Ashyla Luth H. Posey Pronto, MD,  <Dictator> Alric Seton MD ELECTRONICALLY SIGNED 03/25/2014 8:48

## 2015-04-08 NOTE — Consult Note (Signed)
PATIENT NAME:  Donna Lane, Donna Lane MR#:  [id number] DATE OF BIRTH:  1925-06-16  DATE OF CONSULTATION:  03/12/2014  CONSULTING PHYSICIAN:  Deboraha Sprang, MD  Thank you very much for asking [place] to see Donna Lane in consultation because of dyspnea on exertion, edema and x-ray evidence of congestive heart failure.  The patient is an 81 year old woman with known coronary artery disease with prior bypass surgery in 2008 with an interval Myoview in 2013 demonstrating no ischemia and hyperdynamic normal LV function with ejection fraction of 75%. She has a history of significant peripheral vascular disease in the context of long-time smoking, as well as hypertension, diabetes, hyperlipidemia.   She has a history of dyspnea that goes back at least 6 months. She was seen by Dr. [name] in October who gave her an inhaler. She has had some issues of late with worsening shortness of breath and some peripheral edema and with these symptoms came to the Emergency Room. She has not had fever or productive cough. A chest x-ray was consistent with pulmonary edema. Her BNP was elevated in the 2000 range and she was admitted for diuresis. She is feeling considerably better today. Antibiotics have also been added.   As noted, she has significant peripheral vascular disease with bilateral renal artery stenosis, carotid disease and iliac disease. Details included bilateral ultrasonography of the neck in 2012 with 40% to 60% stenosis and bilateral lower extremity Dopplers in 2013 that demonstrated normal bilateral ABI  notwithstanding her known peripheral vascular disease.   PAST MEDICAL HISTORY: In addition to above, is notable for chronic anemia   PAST SURGICAL HISTORY: Notable for tonsillectomy and hip surgery.   SOCIAL HISTORY: She is married. She stopped smoking about 8 years ago.  OUTPATIENT MEDICATIONS: Include amlodipine 5, aspirin 81/162, Lasix 40 b.i.d. glipizide 5, Imdur 15, meloxicam 7.5 b.i.d., metformin 500  b.i.d., metoprolol 50 b.i.d., simvastatin 20 and inhalers.  REVIEW OF SYSTEMS:  Broadly negative apart from what is described above except for arthritis.   PHYSICAL EXAMINATION: GENERAL: She is an older Caucasian female appearing somewhat younger than her 70 years. VITAL SIGNS:  Blood pressure is 134/69 with a pulse of 59. She is afebrile. Respirations are 18. She is wearing oxygen and she complains of a dry nose.  HEENT: Normal.  NECK: Neck veins were flat. The carotids are brisk and full bilaterally without bruits.  BACK: Without kyphosis or scoliosis.  LUNGS:  Clear. HEART: Sounds are regular without murmurs or gallops. She was listened to in the upright position.  ABDOMEN: Soft.  EXTREMITIES: Without edema.  NEUROLOGIC: Grossly normal.  SKIN: Warm and dry.  LABORATORIES:  Notable for creatinine 1.5. Troponins were negative. Hemogram was also normal.  D-dimer that was abnormal.   Chest x-ray:  Demonstrated bibasilar densities consistent with pleural fluid and mild consolidation.   IMPRESSION: 1.  Dyspnea on exertion and edema consistent with congestive heart failure versus chronic obstructive pulmonary disease exacerbation.  2.  Coronary artery disease with prior bypass surgery previously normal left ventricular function and a negative Myoview 2013. 3.  Peripheral vascular disease. 4.  Renal insufficiency.  5.  Question bronchopneumonia.  Donna Lane is doing much better with her diuresis overnight. I would recommend a 2-D echo to look at left ventricular function to make sure there has been no interval deterioration given the progressive nature of her symptoms.   We will follow with you.   Thank you very much for the consultation.  ____________________________ Deboraha Sprang,  MD sck:ce D: 03/12/2014 13:33:39 ET T: 03/12/2014 16:33:44 ET JOB#: [id number]  cc: Deboraha Sprang, MD, <Dictator> Deboraha Sprang MD ELECTRONICALLY SIGNED 03/29/2014 13:04

## 2015-04-08 NOTE — Consult Note (Signed)
Brief Consult Note: Diagnosis: atypical chest pain, acute on chronic diastolic heart failure.   Patient was seen by consultant.   Consult note dictated.   Comments: chest pain is likely due to GERD. I started Protonix.  Agree with gentle hydration for worsening renal function.  Electronic Signatures: Kathlyn Sacramento (MD)  (Signed [phone] 20:01)  Authored: Brief Consult Note   Last Updated: 04-May-15 20:01 by Kathlyn Sacramento (MD)

## 2015-04-08 NOTE — H&P (Signed)
PATIENT NAME:  Donna Donna Lane, Donna Lane MR#:  [id number] DATE OF BIRTH:  1925/05/19  DATE OF ADMISSION:  03/11/2014  PRIMARY CARE PHYSICIAN: Morton Peters., MD  CARDIOLOGIST: Minna Merritts, MD  CHIEF COMPLAINT: Shortness of breath for last several days.   The patient is an 81 year old Caucasian female with past medical history of CAD, status post CABG, PAD, hypertension, diabetes, hyperlipidemia, COPD, ex-smoker, comes to the Emergency Room with increasing shortness of breath for the last several weeks, more so for the last several days. The patient was found to be in congestive heart failure. She also had some wheezing, however, denies any productive cough or fever. Chest x-ray is consistent with congestive heart failure. We will admit her for further evaluation and management.   ALLERGIES: SILVADENE.   PAST MEDICAL HISTORY:  1.  CAD, status post CABG in 2008.  2.  Peripheral arterial disease.  3.  Hypertension.  4.  Hyperlipidemia.  5.  History of former tobacco abuse.  6.  Moderate to severe bilateral renal artery stenosis, 80% bilaterally.  7.  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  8.  Anemia of chronic disease.  9.  Left hip surgery.   FAMILY HISTORY: Father died of MI at age 7. He also had diabetes. Mother died at age 68. The patient's sister has had cardiac problems.   SOCIAL HISTORY: She lives with her husband. Quit smoking about 8 years ago. No alcohol or drug use.   MEDICATIONS: 1.  Amlodipine 5 mg daily.  2.  Aspirin 81 mg 2 tablets daily.  3.  Lasix 40 mg b.i.d.  4.  Glipizide 5 mg daily.  5.  Imdur 30 mg 1/2 tablet daily.  6.  Meloxicam 7.5 mg b.i.d.  7.  Metformin 500 mg b.i.d.  8.  Metoprolol 50 mg b.i.d.  9.  ProAir 2 puffs every 6 hours as needed.  10.  Simvastatin 20 mg daily.   REVIEW OF SYSTEMS:    CONSTITUTIONAL: Positive for shortness of breath. No fever, fatigue, weakness.  EYES: No blurred or double vision,  cataracts or glaucoma.  EARS, NOSE, THROAT: No tinnitus, ear pain, hearing loss or postnasal drip.  RESPIRATORY: Positive for cough and dyspnea. Negative for hemoptysis or COPD.  CARDIOVASCULAR: No chest pain, orthopnea. Positive for edema. Positive for dyspnea on exertion.  GASTROINTESTINAL: No nausea, vomiting, diarrhea, abdominal pain. No GERD.  GENITOURINARY: No dysuria, hematuria or frequency.  ENDOCRINE: No polyuria, nocturia or thyroid problems.  HEMATOLOGY: No anemia or easy bruising.  SKIN: No acne, rash or lesion.  MUSCULOSKELETAL: Positive for arthritis and swelling.  NEUROLOGIC: No CVA, TIA, dementia.  PSYCHIATRIC: No anxiety or depression.   All other systems reviewed and negative.   LABORATORY, DIAGNOSTIC, AND RADIOLOGICAL DATA: EKG shows sinus rhythm with sinus arrhythmia. Chest x-ray shows left basilar density suggestive of pleural fluid with atelectasis, mild consolidation. Troponin is less than 0.02. Creatinine is 1.5. Sodium is 135. Glucose is 185. CBC within normal limits. B-type natriuretic peptide is 2221.   ASSESSMENT AND PLAN: An 81 year old Donna Donna Lane Donna Lane with history of coronary artery disease, status post coronary artery bypass grafting, history of diastolic congestive heart failure with ejection fraction of more than 55% on echo of 2013, history of hypertension, and ex-smoker, comes into the Emergency Room with increasing shortness of breath. She is being admitted with:  1.  Acute diastolic congestive heart failure. The patient's echo from 2013 shows ejection fraction around 55%. We  will admit the patient to telemetry floor cycle cardiac enzymes x 3. Continue her cardiac medications. Give IV Lasix around the clock. Measure ins and outs and daily weights. Will order another echo since it has been more than 2 years since the patient had an echo. Consider cardiology consultation if needed.  2.  Chronic obstructive pulmonary disease with history of ex-tobacco smoking. The  patient does not seem to be wheezing right now. Will continue her oral inhaler. No indications for steroids at this time. She received one dose of Solu-Medrol in the Emergency Room. No evidence of pneumonia. Will hold off on antibiotics. Continue nebulizers as needed.  3.  Hypertension. Continue home medications, which are amlodipine and metoprolol.  4.  Hyperlipidemia, on simvastatin.  5.  Type 2 diabetes. The patient is on metformin. Her creatinine is 1.58. I will hold off on metformin. If creatinine remains on the higher side, consider changing metformin to another group of oral agents for diabetes.  6.  Deep vein thrombosis prophylaxis: Subcutaneous heparin.  7.  Further work-up according to the patient's clinical course. Hospital admission plan was discussed with the patient and the patient's family members.   TIME SPENT: 55 minutes.   ____________________________ Hart Rochester Posey Pronto, MD sap:jcm D: 03/11/2014 14:22:24 ET T: 03/11/2014 15:12:26 ET JOB#: [id number]  cc: Salik Grewell A. Posey Pronto, MD, <Dictator> Ilda Basset MD ELECTRONICALLY SIGNED 03/24/2014 16:24

## 2015-04-08 NOTE — Consult Note (Signed)
Brief Consult Note: Diagnosis: CAD Hx CABG2008 prev normal LV fn(75%& myoview 2013)  admitted wtih DOE, edema consistent CHF, TN neg and mild A/C renal insufficiency.   Patient was seen by consultant.   Consult note dictated.   Orders entered.   Comments: 1) continue low dose diuresis with attention to Cr 2) obtain 2d echo 3) wll follow.  Electronic Signatures: Deboraha Sprang (MD)  (Signed 28-Mar-15 13:25)  Authored: Brief Consult Note   Last Updated: 28-Mar-15 13:25 by Deboraha Sprang (MD)

## 2015-04-08 NOTE — H&P (Signed)
PATIENT NAME:  Donna Lane, Donna Lane MR#:  [id number] DATE OF BIRTH:  30-Dec-1924  DATE OF ADMISSION:  04/16/2014  CHIEF COMPLAINT: Dyspnea and orthopnea.   HISTORY OF PRESENT ILLNESS: Donna Lane is an 81 year old white female with a history of COPD secondary to remote tobacco abuse, coronary artery disease with ischemic and valvular cardiomyopathy with diastolic dysfunction by last admission, who presents with respiratory distress, which developed initially quite gradually over the last 3 to 4 days but became so severe last night that she could not sleep at all despite sitting up to sleep. The patient states that she was discharged from the hospital about 4 weeks ago for a similar episode and followed up with her primary care doctor, Dr. Hardin Negus in [place], during the first week after discharge. At that time she had Lasix dose reinstated, as this had been suspended during the prior hospitalization due to a bump in her creatinine. She states she has been taking 20 mg daily of Lasix for the past 3 weeks and following a 2 gram restricted sodium diet. She has been weighing herself on a daily basis, and her weight dropped to 134 pounds about one week ago and was 137 pounds prior to coming to the hospital yesterday. She does note some heaviness in her chest and severe orthopnea. She has no history of allergies, but states that she has had a lot of allergic symptoms this season with sneezing, coughing, and some shortness of breath. She has not smoked in over 8 years, and there are no current smokers in the house. In the Emergency Room, she was found to be quite hypertensive and was given a dose of Lasix IV 80 mg, along with 81 mg of aspirin. Thus far, she has voided approximately 1 liter. However, she is quite dyspneic at rest, and her saturations continue to drop with ambulation. She is accompanied by husband and daughter, who are both concerned about taking her home. She is unable to walk more than 3 or 4 feet  without being severely short of breath.   The patient's primary care doctor is Donna Lane in [place]. Her cardiologist is Dr. Esmond Plants. She has not seen her cardiologist since discharge from the hospital.   PAST MEDICAL HISTORY: 1.  COPD with pulmonary hypertension.  2.  Diabetes mellitus type 2, on oral antihyperglycemics.  3.  Chronic kidney disease. Creatinine was 1.94 at discharge on March 30.  4.  Peripheral vascular disease with severe bilateral renal artery stenosis, 80% by prior imaging. She also has bilateral iliac artery stenosis, 50% on the right and 70% on the left.  5.  Ischemic cardiomyopathy with valvular dysfunction and diastolic dysfunction by echocardiogram March 2015, EF 60% to 65%, mild to moderate mitral valve regurgitation and mild tricuspid regurgitation.   CURRENT MEDICATIONS:  1.  Simvastatin 20 mg daily.  2.  ProAir metered dose inhaler 2 puffs every 6 hours as needed.  3.  Metoprolol tartrate 50 mg twice daily.  4.  Meloxicam 7.5 mg 1 tablet 2 times daily.  5.  Isosorbide mononitrate 30 mg once daily in the morning. 6.  Glipizide 5 mg once daily in the morning before breakfast. 7.  Aspirin 81 mg 2 tablets daily. 8.  Amlodipine 5 mg daily. 9.  Fluticasone nasal spray 2 sprays in each nostril once daily.   PAST SURGICAL HISTORY: Includes a CABG in 2008. Details are not available.   ALLERGIES: SHE HAS ALLERGIES TO SILVADENE.   LAST HOSPITALIZATION: March 27th through  the 30th of 2015 for shortness of breath.   FAMILY HISTORY: Father died at age 90 of a massive heart attack. Sister also has coronary artery disease.   SOCIAL HISTORY: She is married and a nondrinker. She quit smoking 8 years ago.   REVIEW OF SYSTEMS:  CONSTITUTIONAL: Positive for fatigue without fevers or weakness. She has noted a 3-pound weight gain this week.  EYES: No changes in vision or history of eye pain or redness.  EARS, NOSE, THROAT: No history of hearing loss. She does  note some seasonal rhinitis with this season.  RESPIRATORY: Positive for cough, wheezing, dyspnea, and heaviness in her chest which she regards as painful respirations. She does have a history of COPD.  CARDIOVASCULAR: Positive for chest heaviness, without describing true anginal symptoms. She has had no history of palpitations or syncope. She does have high blood pressure.  GASTROINTESTINAL: Negative for nausea, vomiting, diarrhea, or abdominal pain.  GYNECOLOGIC: She denies any history of breast masses, tenderness, or discharge. She is postmenopausal.  GENITOURINARY: No polyuria or nocturia.  ENDOCRINE: She does have diabetes with no recent hypoglycemic episodes.  HEMATOLOGIC: She does have easy bruising secondary to use of aspirin.  INTEGUMENTARY: She has noted no changes in skin, other than some lower extremity edema.  MUSCULOSKELETAL: She has chronic arthritis, managed with meloxicam.  NEUROLOGIC: She has no history of dysarthria, tremor, vertigo or dementia.  PSYCHIATRIC: She has no history of anxiety or insomnia.   PHYSICAL EXAMINATION: GENERAL: This is an alert and oriented elderly woman who is short of breath at rest, speaking in sentences.  VITAL SIGNS: Initial pulse was 57 and regular, respirations 20, blood pressure 150/65, pulse oximetry 98% on 2 liters. Subsequent blood pressures have been as high as 173/80.  HEENT: Pupils are equal, round, and reactive to light. Extraocular movements are intact. Sclerae are anicteric. Oropharynx is benign (Dictation Anomaly) mucosa is normal and there is no  tonsillar erythema.  NECK: Supple without lymphadenopathy, JVD, or carotid bruits.  LUNGS: Notable for poor air movement bilaterally, with wheezing and bronchospasm noted in all lung fields. There are no rales appreciated on today's exam.  CARDIOVASCULAR: Regular rate and rhythm. No murmurs, rubs, or gallops. Chest wall is nontender. Pedal pulses are palpable. There is trace lower extremity  edema.  BREASTS: Nontender.  ABDOMEN: Soft, nontender, and nondistended with good bowel sounds and no evidence of hepatosplenomegaly.  EXTREMITIES: She is moving all extremities well. Gait was not tested due to extreme shortness of breath.  SKIN: Warm and dry, with some tenting noted.   ADMISSION LABORATORY AND DIAGNOSTIC DATA: Sodium 136, potassium 4.4, chloride 102, bicarbonate 26, BUN 13(Dictation Anomaly) , creatinine 1.25, glucose 228. White count 13.6, hemoglobin 13.0, platelets 199. Liver function tests are normal. CK and MB were not checked, but troponin is 0.02, on repeat 6 hours later 0.02.   PA and lateral chest x-ray shows clear lung fields. No cardiomegaly. Sternotomy changes noted.   EKG shows normal sinus rhythm with ST and T wave abnormalities in the lateral leads.   ASSESSMENT AND PLAN: 1.  Acute respiratory failure: Likely multifactorial due to mild diastolic dysfunction causing pulmonary edema, which is now resolved, with persistent severe wheezing secondary to chronic obstructive pulmonary disease exacerbation. We will admit for IV steroids, round-the-clock nebulizers, and empiric antibiotics.  2.  Diastolic dysfunction with fluid retention: The patient has diuresed 1 liter already in-house. She was given quite a hefty dose of Lasix with 80 mg IV. We will  reduce dose to 20 mg IV daily. Follow weights and creatinine. She has normal left ventricular function and has diastolic dysfunction. I am holding her metoprolol due to her chronic obstructive pulmonary disease exacerbation and have added hydralazine.  3.  Diabetes mellitus type 2: The patient's fingerstick glucose is currently 226 and is expected to rise dramatically with use of steroids. Accu-Cheks b.i.d. and will add Lantus 10 units daily to prevent severe hyperglycemia. Will also increase her glipizide to 5 mg twice daily.  4.  Hypertension: Again, the patient's metoprolol has been suspended due to her chronic obstructive  pulmonary disease exacerbation. I have increased her amlodipine to 10 mg and added hydralazine 25 mg q.i.d.   ESTIMATED TIME OF CARE: 60 minutes.    ____________________________ Deborra Medina, MD tt:jcm D: 04/16/2014 15:32:48 ET T: 04/16/2014 17:36:11 ET JOB#: [id number]  cc: Deborra Medina, MD, <Dictator> Deborra Medina MD ELECTRONICALLY SIGNED 04/19/2014 7:53

## 2015-04-08 NOTE — Discharge Summary (Signed)
Dates of Admission and Diagnosis:  Date of Admission 16-Apr-2014   Date of Discharge 19-Apr-2014   Admitting Diagnosis COPD excerbation.   Final Diagnosis ac respi failure due to COPD and Diastolic CHF COPD exacerbation Ac diastolic CHF ARF- stopped lasix- need to have doppler study in Dr. Donivan Scull office. DM- with high blood sugar- due to Steroid use- Need to start insulin and stop metformin due to renal issues. Steroid induced gastritis- Taper quickly. Htn.    Chief Complaint/History of Present Illness an 81 year old white female with a history of COPD secondary to remote tobacco abuse, coronary artery disease with ischemic and valvular cardiomyopathy with diastolic dysfunction by last admission, who presents with respiratory distress, which developed initially quite gradually over the last 3 to 4 days but became so severe last night that she could not sleep at all despite sitting up to sleep. The patient states that she was discharged from the hospital about 4 weeks ago for a similar episode and followed up with her primary care doctor, Dr. Hardin Negus in [place], during the first week after discharge. At that time she had Lasix dose reinstated, as this had been suspended during the prior hospitalization due to a bump in her creatinine. She states she has been taking 20 mg daily of Lasix for the past 3 weeks and following a 2 gram restricted sodium diet. She has been weighing herself on a daily basis, and her weight dropped to 134 pounds about one week ago and was 137 pounds prior to coming to the hospital yesterday. She does note some heaviness in her chest and severe orthopnea. She has no history of allergies, but states that she has had a lot of allergic symptoms this season with sneezing, coughing, and some shortness of breath. She has not smoked in over 8 years, and there are no current smokers in the house. In the Emergency Room, she was found to be quite hypertensive and was given a dose  of Lasix IV 80 mg, along with 81 mg of aspirin. Thus far, she has voided approximately 1 liter. However, she is quite dyspneic at rest, and her saturations continue to drop with ambulation. She is accompanied by husband and daughter, who are both concerned about taking her home. She is unable to walk more than 3 or 4 feet without being severely short of breath.   Allergies:  Codeine: N/V/Diarrhea, GI Distress  Hepatic:  02-May-15 08:54   Bilirubin, Total 0.5  Alkaline Phosphatase 92 (45-117 NOTE: New Reference Range 11/05/13)  SGPT (ALT) 14  SGOT (AST) 22  Total Protein, Serum 7.4  Albumin, Serum 3.6  Routine Chem:  02-May-15 08:45   B-Type Natriuretic Peptide [facility])  2241 (Result(s) reported on 16 Apr 2014 at 09:43AM.)    08:54   Glucose, Serum  228  BUN 16  Creatinine (comp) 1.25  Sodium, Serum 136  Potassium, Serum 4.4  Chloride, Serum 102  CO2, Serum 26  Calcium (Total), Serum 9.5  Anion Gap 8  Osmolality (calc) 280  eGFR (African American)  45  eGFR (Non-African American)  39 (eGFR values <64m/min/1.73 m2 may be an indication of chronic kidney disease (CKD). Calculated eGFR is useful in patients with stable renal function. The eGFR calculation will not be reliable in acutely ill patients when serum creatinine is changing rapidly. It is not useful in  patients on dialysis. The eGFR calculation may not be applicable to patients at the low and high extremes of body sizes, pregnant women, and vegetarians.)  03-May-15  05:22   Creatinine (comp)  1.49  04-May-15 04:15   Creatinine (comp)  1.96  05-May-15 05:06   Creatinine (comp)  1.89  Cardiac:  02-May-15 08:54   Troponin I < 0.02 (0.00-0.05 0.05 ng/mL or less: NEGATIVE  Repeat testing in 3-6 hrs  if clinically indicated. >0.05 ng/mL: POTENTIAL  MYOCARDIAL INJURY. Repeat  testing in 3-6 hrs if  clinically indicated. NOTE: An increase or decrease  of 30% or more on serial  testing suggests a  clinically  important change)    12:50   Troponin I < 0.02 (0.00-0.05 0.05 ng/mL or less: NEGATIVE  Repeat testing in 3-6 hrs  if clinically indicated. >0.05 ng/mL: POTENTIAL  MYOCARDIAL INJURY. Repeat  testing in 3-6 hrs if  clinically indicated. NOTE: An increase or decrease  of 30% or more on serial  testing suggests a  clinically important change)  04-May-15 11:42   Troponin I < 0.02 (0.00-0.05 0.05 ng/mL or less: NEGATIVE  Repeat testing in 3-6 hrs  if clinically indicated. >0.05 ng/mL: POTENTIAL  MYOCARDIAL INJURY. Repeat  testing in 3-6 hrs if  clinically indicated. NOTE: An increase or decrease  of 30% or more on serial  testing suggests a  clinically important change)  Routine Hem:  02-May-15 08:54   WBC (CBC)  13.6  RBC (CBC) 4.51  Hemoglobin (CBC) 13.0  Hematocrit (CBC) 41.1  Platelet Count (CBC) 199 (Result(s) reported on 16 Apr 2014 at 09:09AM.)  MCV 91  MCH 28.9  MCHC  31.7  RDW 13.2   PERTINENT RADIOLOGY STUDIES: XRay:    27-Mar-15 11:45, Chest Portable Single View  Chest Portable Single View   REASON FOR EXAM:    SOB  COMMENTS:       PROCEDURE: DXR - DXR PORTABLE CHEST SINGLE VIEW  - Mar 11 2014 11:45AM     CLINICAL DATA:  Shortness of breath.    EXAM:  PORTABLE CHEST - 1 VIEW    COMPARISON:  07/02/2010 and 06/27/2012    FINDINGS:  Single view of the chest demonstrates left basilar densities.  Findings are suggestive for pleural fluid with atelectasis or  consolidation. Upper lungs are clear. Heart size is mildly enlarged.  Median sternotomy wires are again noted. Trachea is midline. No  acute bone abnormality.     IMPRESSION:  Left basilar densities are suggestive for pleural fluid with  atelectasis or mild consolidation.      Electronically Signed    By: Markus Daft M.D.    On: 03/11/2014 11:58         Verified By: Burman Riis, M.D.,    02-May-15 08:54, Chest Portable Single View  Chest Portable Single View   REASON FOR EXAM:     sob  COMMENTS:       PROCEDURE: DXR - DXR PORTABLE CHEST SINGLE VIEW  - Apr 16 2014  8:54AM     CLINICAL DATA:  Short of breath.  CHF    EXAM:  PORTABLE CHEST - 1 VIEW    COMPARISON:  03/11/2014    FINDINGS:  Previous median sternotomy and CABG procedure. The heart size and  mediastinal contours are within normal limits. Both lungs are clear.  The visualized skeletal structures are unremarkable.     IMPRESSION:  No active disease.      Electronically Signed    By: Kerby Moors M.D.    On: 04/16/2014 09:23         Verified By: Angelita Ingles, M.D.,  Pertinent Past History:  Pertinent Past History 1.  COPD with pulmonary hypertension.  2.  Diabetes mellitus type 2, on oral antihyperglycemics.  3.  Chronic kidney disease. Creatinine was 1.94 at discharge on March 30.  4.  Peripheral vascular disease with severe bilateral renal artery stenosis, 80% by prior imaging. She also has bilateral iliac artery stenosis, 50% on the right and 70% on the left.  5.  Ischemic cardiomyopathy with valvular dysfunction and diastolic dysfunction by echocardiogram March 2015, EF 60% to 65%, mild to moderate mitral valve regurgitation and mild tricuspid regurgitation.   Hospital Course:  Hospital Course *   Acute respiratory failure: multifactorial due to mild diastolic dysfunction causing pulmonary edema, with persistent severe wheezing secondary to chronic obstructive pulmonary disease exacerbation.    on treatment for CHF and COPD- responding nicely so far. resolved respi failure- on room air on discharge. * Chest pain-  given NTG.   trop, EKG and cardiology consult reviewed- Likely have steroid indused gastritis.   Taper steroids fast, as she only received for 4 days- will taper in next 3 days at home.    Started PPI BID. * ARF- stopped lasix, NS 60 ml/hr.- slight improvement.   Dr. Fletcher Anon thinking it may be renal artery stenosis causing this- Plan doppler in office next week.     Advised pt to keep fluid restriction of 1500 ml till next week and no lasix. * Diastolic dysfunction with fluid retention:    was On lasix 20 mg daily, stopped due to renal failure. * COPD exacerbation    IV stroids, nebs, responding good. aswiched to oral steroids. * Diabetes mellitus type 2: Lantus 5 units daily to prevent severe hyperglycemia. glipizide to 5 mg twice daily.     on insulin sliding scale. was on metformin, but due to frequent worsening renal function- high risk for complications- so stopped.    She want to start tking lantus after talking to her PMD, I suggested to make appointment tomorrow. * Hypertension: metoprolol has been suspended due to her chronic obstructive pulmonary disease exacerbation. amlodipine to 10 mg and added hydralazine 25 mg q.i.d.   Condition on Discharge Stable   Code Status:  Code Status Full Code   DISCHARGE INSTRUCTIONS HOME MEDS:  Medication Reconciliation: Patient's Home Medications at Discharge:     Medication Instructions  meloxicam 7.5 mg oral tablet  1 tab(s) orally 2 times a day   aspirin enteric coated 81 mg oral delayed release tablet  2 tabs (1110m) orally once a day (at bedtime)   simvastatin 20 mg oral tablet  1 tab(s) orally once a day (at bedtime)   proair hfa cfc free 90 mcg/inh inhalation aerosol  2 puff(s) inhaled every 6 hours, As Needed - for Wheezing   isosorbide mononitrate 60 mg oral tablet, extended release  1 tab(s) orally once a day (in the morning)   prednisone 10 mg oral tablet  take 3 tabs on 1st day, 2 on 2nd day and 1 on 3rd day- then stop.   aluminum hydroxide/magnesium hydroxide/simethicone 400 mg-400 mg-40 mg/5 ml oral suspension  5 milliliter(s) orally 2 times a day x 5 days    glipizide 5 mg oral tablet  1 tab(s) orally 2 times a day (before meals)   levemir flexpen 100 units/ml subcutaneous solution  5 unit(s) subcutaneous once a day   amlodipine 10 mg oral tablet  1 tab(s) orally once a day   pantoprazole  40 mg oral delayed release tablet  1 tab(s) orally 2 times a day x 30 days    levofloxacin 500 mg oral tablet  1 tab(s) orally every 48 hours x 3 days    hydralazine 25 mg oral tablet  1 tab(s) orally 3 times a day    STOP TAKING THE FOLLOWING MEDICATION(S):    metoprolol tartrate 50 mg oral tablet: 1 tab(s) orally 2 times a day furosemide 20 mg oral tablet: 1 tab(s) orally once a day (in the morning)  Physician's Instructions:  Home Health? Yes   [place] Instructions collect blood for renal function and communicate results with Dr. [name] office in 1 week.   Diet Low Sodium  Carbohydrate Controlled (ADA) Diet   Activity Limitations As tolerated   Return to Work Not Applicable   Time frame for Follow Up Appointment 1-2 weeks  Dr. [name]   Time frame for Follow Up Appointment 1-2 days  PMD office   Other Comments Need to check kidney function in next 2-3 days- On 04/19/14 creatinin is 1.89- held lasix till she visit cardiologist in office.   Electronic Signatures: Vaughan Basta (MD)  (Signed 10-May-15 22:19)  Authored: ADMISSION DATE AND DIAGNOSIS, CHIEF COMPLAINT/HPI, Allergies, PERTINENT LABS, PERTINENT RADIOLOGY STUDIES, PERTINENT PAST HISTORY, HOSPITAL COURSE, DISCHARGE INSTRUCTIONS HOME MEDS, PATIENT INSTRUCTIONS   Last Updated: 10-May-15 22:19 by Vaughan Basta (MD)

## 2015-04-09 NOTE — H&P (Signed)
PATIENT NAME:  Donna Lane, Donna Lane MR#:  [id number] DATE OF BIRTH:  April 06, 1925  DATE OF ADMISSION:  06/27/2012  REFERRING PHYSICIAN: ER physician Dr. Benjaman Lobe    PRIMARY CARE PHYSICIAN: Dr. Laurian Brim   CARDIOLOGIST: Dr. [name]    CHIEF COMPLAINT: Chest pain/epigastric pain.   HISTORY OF PRESENT ILLNESS: The patient is an 81 year old female with past medical history of coronary artery disease status post coronary artery bypass graft, PAD, hypertension, diabetes, and hyperlipidemia who reports that after her breakfast this morning she developed sudden onset of dull pressure-like retrosternal/epigastric pain. The patient took three sublingual nitroglycerin which did not help her. She also took some TUMS without any relief. She had some nausea, therefore, she came to the Emergency Room. Denies eating any fatty spicy food or something unusual. The patient has history of gastroesophageal reflux disease and takes omeprazole. She reports that since her surgery in 2008 she has not felt right. She last saw Dr. [name] about 1 to 2 weeks ago. She also reports that her left leg has been swelling without any pain. She has chronic shortness of breath but felt more so today.    ALLERGIES: Silvadene.   CURRENT MEDICATIONS:  1. Aspirin 325 mg daily.  2. Glipizide 5 mg daily.  3. Metformin 500 mg b.i.d.  4. Zetia 10 mg daily.  5. Meloxicam 7.5 mg daily.  6. Imdur 60 mg daily.  7. Simvastatin 20 mg daily.  8. Lasix 40 mg daily.  9. Lopressor 50 mg b.i.d.  10. Norvasc 5 mg daily.  11. Omeprazole 20 mg daily.   PAST MEDICAL HISTORY:  1. History of CAD status post bypass surgery in 2008. 2. PVD. 3. Hypertension. 4. Hyperlipidemia. 5. History of distant tobacco abuse. 6. Moderate to severe bilateral renal artery stenosis 80% bilaterally. 7. Bilateral iliac disease with 50% on the right and 70% on the left, 50 to 69% bilateral carotid stenosis.  8. Anemia of chronic disease.   PAST SURGICAL  HISTORY:  1. Coronary artery bypass graft.  2. Left hip surgery.   FAMILY HISTORY: Father died of MI at the age of 69. He also had diabetes. Mother had hydrops, died at the age of 68. Sister had open heart surgery. Grandmother had diabetes.   SOCIAL HISTORY: She is married. Lives with her husband. Quit smoking 5-1/2 years ago. There is no alcohol or drug abuse.   LAST HOSPITALIZATION: The patient was last admitted to [facility] in July of 2011 for inflammatory tenosynovitis with polyarticular involvement, acute renal failure, hyponatremia.   REVIEW OF SYSTEMS: The patient reports chronic fatigue and weakness. EYES: Denies any blurred or double vision. ENT: Denies any tinnitus, ear pain. RESPIRATORY: Denies any cough, wheezing. CARDIOVASCULAR: Denies any chest pain, palpitations. GI: Denies any nausea, vomiting, diarrhea, abdominal pain. GU: Denies any dysuria, hematuria. ENDOCRINE: Denies any polyuria or nocturia. HEME/LYMPH: Denies any anemia, easy bruisability. INTEGUMENTARY: Denies any acne or rash. MUSCULOSKELETAL: Denies any swelling, gout. NEUROLOGICAL: Denies any numbness, weakness. PSYCH: Denies any anxiety, depression.   PHYSICAL EXAMINATION:   VITAL SIGNS: Temperature 97.4, heart rate 57, respiratory rate 20, blood pressure 187/87, pulse oximetry 97% on room air.   GENERAL: The patient is an 81 year old female lying comfortably in bed not in acute distress.   HEAD: Atraumatic, normocephalic.   EYES: There is some pallor. No icterus or cyanosis. Pupils equal, round, and reactive to light and accommodation. Extraocular movements intact.   ENT: Wet mucous membranes. No oropharyngeal erythema or thrush.   NECK: Supple.  No masses. No JVD. No thyromegaly. No lymphadenopathy.   CHEST WALL: No tenderness to palpation. Not using accessory muscles of respiration. No intercostal muscle retractions.   LUNGS: Bilaterally clear to auscultation. No wheezing, rales, or rhonchi.   CARDIOVASCULAR:  S1, S2 regular. There is a systolic murmur. No rubs or gallops.   ABDOMEN: Soft, nondistended. No guarding. No rigidity. No organomegaly. The patient has some tenderness to palpation in the epigastrium. Normal bowel sounds.   SKIN: No rashes or lesions.   PERIPHERIES: The patient has trace pedal edema on both her lower extremities. There is no tenderness to palpation on her calves. 1+ pedal pulses.   SKIN: The patient has ecchymosis and chronic age related hyperpigmented spots on her lower extremities.    MUSCULOSKELETAL: No cyanosis or clubbing.   NEUROLOGIC: Awake, alert, oriented x3. Nonfocal neurological exam. Cranial nerves grossly intact.   PSYCH: Normal mood and affect.   LABORATORY, DIAGNOSTIC, AND RADIOLOGICAL DATA: CBC normal. CMP essentially normal. Cardiac enzymes negative. Lipase 234.   EKG shows T wave inversions in lateral leads.   ASSESSMENT AND PLAN: This is an 81 year old female with past medical history of CAD status post CABG, PVD, bilateral renal stenosis, diabetes, and hyperlipidemia who presents with epigastric/retrosternal pain described as dull pressure-like.  1. Unstable angina. The patient reports sudden onset of chest pain. Her EKG showed some T wave inversions. Will admit her to the hospital. Check serial cardiac enzymes. Continue her aspirin and beta-blocker. She is not on any ACE inhibitor likely due to her renal stenosis. Will place her on p.r.n. nitroglycerin. Continue her Imdur. I would hold off anticoagulation for the time being. Obtain a Cardiology consultation and 2-D echo.  2. Accelerated hypertension possibly due to acute stress. Will continue her medications for the time being and adjust as needed to achieve good hypertensive control.  3. Diabetes. Will check a hemoglobin A1c. Continue her glipizide and metformin.  4. Hyperlipidemia. Continue Zetia and statin. Will check a fasting lipid profile.  5. History of severe PVD. Will continue aspirin therapy.    Reviewed all medical records, discussed with the ED physician, discussed with the patient and her husband the plan of care and management.   TIME SPENT: 75 minutes.   ____________________________ Cherre Huger, MD sp:drc D: 06/27/2012 18:36:41 ET T: 06/28/2012 08:20:35 ET JOB#: [id number]  cc: Cherre Huger, MD, <Dictator> Morton Peters., MD Cherre Huger MD ELECTRONICALLY SIGNED 06/28/2012 12:41

## 2015-04-09 NOTE — Consult Note (Signed)
General Aspect Donna Lane is a pleasant 81 year old woman with history of coronary artery disease, catheterization August 25, 2007 showing severe three-vessel disease, with bypass surgery at [facility] September 2008 also with severe peripheral vascular disease, bilateral renal artery stenosis, carotid disease, iliac disease. She had recurrent episodes of chest pain since her CABG. She was last seen in 03/2012 by Dr. [name]. Had chest pain at that time felt to be due to GERD.   She presented with chest pain happned yesterday am after breakfast. It was epigastric and lower substernal. Last whole day and did not improve with NTG (she thinks it was expired). No exertional symptoms.  Chest pain free overnight. CEs remained negative. ECG showed anterior TW changes (not new).   Physical Exam:   GEN no acute distress    NECK supple  No masses    RESP normal resp effort  clear BS    CARD Regular rate and rhythm  Murmur    Murmur Systolic    Systolic Murmur Out flow  1/6    ABD denies tenderness    LYMPH negative neck    EXTR negative cyanosis/clubbing, negative edema    SKIN normal to palpation    PSYCH alert, A+O to time, place, person   Review of Systems:   Subjective/Chief Complaint chest pain.    General: No Complaints    Skin: No Complaints    ENT: No Complaints    Eyes: No Complaints    Neck: No Complaints    Cardiovascular: Chest pain or discomfort    Gastrointestinal: Heartburn    Genitourinary: No Complaints    Vascular: No Complaints    Neurologic: No Complaints    Hematologic: No Complaints    Psychiatric: No Complaints    Review of Systems: All other systems were reviewed and found to be negative   Lab Results: Routine Chem:  13-Jul-13 16:24    Lipase 234 (Result(s) reported on 27 Jun 2012 at [facility].)   Glucose, Serum 98   BUN 16   Creatinine (comp) 1.30   Sodium, Serum 139   Potassium, Serum 4.2   Chloride, Serum 101   CO2,  Serum 29   Calcium (Total), Serum 9.7   Anion Gap 9   Osmolality (calc) 279   eGFR (African American)  44   eGFR (Non-African American)  38 (eGFR values <93m/min/1.73 m2 may be an indication of chronic kidney disease (CKD). Calculated eGFR is useful in patients with stable renal function. The eGFR calculation will not be reliable in acutely ill patients when serum creatinine is changing rapidly. It is not useful in  patients on dialysis. The eGFR calculation may not be applicable to patients at the low and high extremes of body sizes, pregnant women, and vegetarians.)  Cardiac:  13-Jul-13 16:24    CK, Total 54   CPK-MB, Serum 1.2 (Result(s) reported on 27 Jun 2012 at 05:10PM.)   Troponin I < 0.02 (0.00-0.05 0.05 ng/mL or less: NEGATIVE  Repeat testing in 3-6 hrs  if clinically indicated. >0.05 ng/mL: POTENTIAL  MYOCARDIAL INJURY. Repeat  testing in 3-6 hrs if  clinically indicated. NOTE: An increase or decrease  of 30% or more on serial  testing suggests a  clinically important change)  Routine Hem:  13-Jul-13 16:24    WBC (CBC) 9.5   RBC (CBC) 5.02   Hemoglobin (CBC) 14.9   Hematocrit (CBC) 45.6   Platelet Count (CBC) 164 (Result(s) reported on 27 Jun 2012 at 04:54PM.)  MCV 91   MCH 29.7   MCHC 32.6   RDW 13.9   EKG:   EKG NSR    Interpretation anterior TW changes (old)    No Known Allergies:   Vital Signs/Nurse's Notes: **Vital Signs.:   14-Jul-13 07:50   Vital Signs Type Routine   Temperature Temperature (F) 98   Celsius 36.6   Pulse Pulse 60   Respirations Respirations 16   Systolic BP Systolic BP 927   Diastolic BP (mmHg) Diastolic BP (mmHg) 70   Mean BP 89   Pulse Ox % Pulse Ox % 95   Pulse Ox Activity Level  At rest   Oxygen Delivery Room Air/ 21 %     Impression 1. Atypical chest pain.  2. CAD s/p CABG 3. Hypertension.  4. PAD.    Plan Symptoms are atypical and could be GI in nature. Ruled out by enzymes. ECG is abnormal but changes are  not new.  OK to discharge home today.  I recommend an outpatient Lexiscan nuclear stress test which will be arranged.  She needs a new prescription for SL Nitroglycerine prn.   Electronic Signatures: Kathlyn Sacramento (MD)  (Signed 14-Jul-13 11:40)  Authored: General Aspect/Present Illness, History and Physical Exam, Review of System, Labs, EKG , Allergies, Vital Signs/Nurse's Notes, Impression/Plan   Last Updated: 14-Jul-13 11:40 by Kathlyn Sacramento (MD)

## 2015-04-24 DIAGNOSIS — I10 Essential (primary) hypertension: Secondary | ICD-10-CM | POA: Diagnosis not present

## 2015-04-24 DIAGNOSIS — E785 Hyperlipidemia, unspecified: Secondary | ICD-10-CM | POA: Diagnosis not present

## 2015-04-25 DIAGNOSIS — I10 Essential (primary) hypertension: Secondary | ICD-10-CM | POA: Diagnosis not present

## 2015-04-25 DIAGNOSIS — M5489 Other dorsalgia: Secondary | ICD-10-CM | POA: Diagnosis not present

## 2015-04-27 ENCOUNTER — Other Ambulatory Visit: Payer: Self-pay

## 2015-04-27 DIAGNOSIS — R200 Anesthesia of skin: Secondary | ICD-10-CM

## 2015-04-27 DIAGNOSIS — M79604 Pain in right leg: Secondary | ICD-10-CM

## 2015-04-27 DIAGNOSIS — M79605 Pain in left leg: Principal | ICD-10-CM

## 2015-04-27 DIAGNOSIS — M5440 Lumbago with sciatica, unspecified side: Secondary | ICD-10-CM

## 2015-05-05 ENCOUNTER — Ambulatory Visit
Admission: RE | Admit: 2015-05-05 | Discharge: 2015-05-05 | Disposition: A | Discharge status: Home / Self Care | Payer: Medicare Other | Source: Ambulatory Visit

## 2015-05-05 DIAGNOSIS — M4806 Spinal stenosis, lumbar region: Secondary | ICD-10-CM | POA: Diagnosis not present

## 2015-05-05 DIAGNOSIS — M79604 Pain in right leg: Secondary | ICD-10-CM | POA: Diagnosis present

## 2015-05-05 DIAGNOSIS — M79605 Pain in left leg: Secondary | ICD-10-CM

## 2015-05-05 DIAGNOSIS — M5440 Lumbago with sciatica, unspecified side: Secondary | ICD-10-CM

## 2015-05-05 DIAGNOSIS — R200 Anesthesia of skin: Secondary | ICD-10-CM | POA: Insufficient documentation

## 2015-05-05 DIAGNOSIS — M47897 Other spondylosis, lumbosacral region: Secondary | ICD-10-CM | POA: Insufficient documentation

## 2015-05-05 DIAGNOSIS — M545 Low back pain: Secondary | ICD-10-CM | POA: Diagnosis present

## 2015-05-11 DIAGNOSIS — M5489 Other dorsalgia: Secondary | ICD-10-CM | POA: Diagnosis not present

## 2015-05-17 DIAGNOSIS — M4806 Spinal stenosis, lumbar region: Secondary | ICD-10-CM | POA: Diagnosis not present

## 2015-05-25 ENCOUNTER — Ambulatory Visit: Payer: Medicare Other | Admitting: Podiatry

## 2015-05-30 ENCOUNTER — Encounter: Payer: Self-pay | Admitting: Podiatry

## 2015-05-30 ENCOUNTER — Ambulatory Visit (INDEPENDENT_AMBULATORY_CARE_PROVIDER_SITE_OTHER): Payer: Medicare Other | Admitting: Podiatry

## 2015-05-30 DIAGNOSIS — M79606 Pain in leg, unspecified: Secondary | ICD-10-CM

## 2015-05-30 DIAGNOSIS — B351 Tinea unguium: Secondary | ICD-10-CM

## 2015-05-30 NOTE — Progress Notes (Signed)
Patient ID: BIRDELLA SIPPEL, female   DOB: September 28, 1925, 81 y.o.   MRN: [id number]  Subjective: 81 y.o.-year-old female returns the office today for painful, elongated, thickened toenails which she is unable to trim herself.  Denies any redness or drainage around the nails. Denies any acute changes since last appointment and no new complaints today. She has numbness and tingling to her legs with activity from spinal stenosis. She will be starting injections next week. She played 9 rounds of gold yesterday without problems Denies any systemic complaints such as fevers, chills, nausea, vomiting.   Objective: AAO 3, NAD DP/PT pulses palpable, CRT less than 3 seconds Protective sensation intact with Simms Weinstein monofilament, Achilles tendon reflex intact.  Nails hypertrophic, dystrophic, elongated, brittle, discolored 9. There is tenderness over nails 1-5 bilaterally except the left hallux. The left hallux nail has previously been removed. There is mild incurvation of all the nails. There is no surrounding erythema or drainage along the nail sites. No open lesions or pre-ulcerative lesions are identified. No other areas of tenderness bilateral lower extremities. No overlying edema, erythema, increased warmth. No pain with calf compression, swelling, warmth, erythema.  Assessment: Patient presents with symptomatic onychomycosis  Plan: -Treatment options including alternatives, risks, complications were discussed -Nails sharply debrided x 9 without complication/bleeding. -Discussed daily foot inspection. If there are any changes, to call the office immediately.  -Follow-up in 3 months or sooner if any problems are to arise. In the meantime, encouraged to call the office with any questions, concerns, changes symptoms.

## 2015-06-06 DIAGNOSIS — M4806 Spinal stenosis, lumbar region: Secondary | ICD-10-CM | POA: Diagnosis not present

## 2015-06-20 ENCOUNTER — Other Ambulatory Visit: Payer: Self-pay

## 2015-06-20 MED ORDER — SIMVASTATIN 20 MG PO TABS: 20.0000 mg | ORAL_TABLET | Freq: Every day | ORAL | Status: DC

## 2015-06-20 NOTE — Telephone Encounter (Signed)
Refill sent for simvastatin.

## 2015-06-27 DIAGNOSIS — M4806 Spinal stenosis, lumbar region: Secondary | ICD-10-CM | POA: Diagnosis not present

## 2015-07-07 DIAGNOSIS — E119 Type 2 diabetes mellitus without complications: Secondary | ICD-10-CM | POA: Diagnosis not present

## 2015-07-11 ENCOUNTER — Encounter: Payer: Self-pay | Admitting: Cardiovascular Disease

## 2015-07-11 ENCOUNTER — Ambulatory Visit (INDEPENDENT_AMBULATORY_CARE_PROVIDER_SITE_OTHER): Payer: Medicare Other | Admitting: Cardiovascular Disease

## 2015-07-11 VITALS — BP 110/54 | HR 85 | Ht 64.0 in | Wt 134.2 lb

## 2015-07-11 DIAGNOSIS — E118 Type 2 diabetes mellitus with unspecified complications: Secondary | ICD-10-CM

## 2015-07-11 DIAGNOSIS — I10 Essential (primary) hypertension: Secondary | ICD-10-CM | POA: Diagnosis not present

## 2015-07-11 DIAGNOSIS — E785 Hyperlipidemia, unspecified: Secondary | ICD-10-CM

## 2015-07-11 DIAGNOSIS — R0602 Shortness of breath: Secondary | ICD-10-CM

## 2015-07-11 DIAGNOSIS — I5032 Chronic diastolic (congestive) heart failure: Secondary | ICD-10-CM | POA: Diagnosis not present

## 2015-07-11 DIAGNOSIS — I25718 Atherosclerosis of autologous vein coronary artery bypass graft(s) with other forms of angina pectoris: Secondary | ICD-10-CM | POA: Diagnosis not present

## 2015-07-11 DIAGNOSIS — I739 Peripheral vascular disease, unspecified: Secondary | ICD-10-CM

## 2015-07-11 NOTE — Assessment & Plan Note (Signed)
Tolerating low-dose simvastatin Goal LDL less than 70

## 2015-07-11 NOTE — Assessment & Plan Note (Signed)
Currently with no symptoms of angina. No further workup at this time. Continue current medication regimen. 

## 2015-07-11 NOTE — Patient Instructions (Addendum)
You are doing well. No medication changes were made.  Please call us if you have new issues that need to be addressed before your next appt.  Your physician wants you to follow-up in: 6 months.  You will receive a reminder letter in the mail two months in advance. If you don't receive a letter, please call our office to schedule the follow-up appointment.   

## 2015-07-11 NOTE — Assessment & Plan Note (Signed)
Blood pressure is well controlled on today's visit. No changes made to the medications. Lab work pending

## 2015-07-11 NOTE — Progress Notes (Signed)
Patient ID: Donna Lane, female    DOB: 1925/10/20, 81 y.o.   MRN: [id number]  HPI Comments: Donna Lane is a pleasant 81 year old woman with history of coronary artery disease, catheterization August 25, 2007 showing severe three-vessel disease, with bypass surgery at [facility] September 2008 also with severe peripheral vascular disease, bilateral renal artery stenosis, carotid disease, iliac disease who presents  for follow-up of her coronary artery disease.  In follow-up today, she reports having severe back pain Recent MRI through Dr. Hardin Negus showing spinal stenosis at multiple levels, foraminal narrowing She does not want to take pain medication Not playing much golf in the heat and secondary to back pain No recent lab work available  EKG on today's visit shows normal sinus rhythm with rate 85 bpm, no significant ST or T-wave changes  Other past medical history previous episodes of chest pain  resolved by decreasing the dose of her aspirin from 325 mg daily to 81 mg daily. She currently takes 81 mg 2 Prior pain in the subxiphoid region with occasional nausea and vomiting of green bilious liquid. She attributes her discomfort to taking meloxicam which was initiated for possible gout.    hospital admission 04/17/2014  to 04/19/2014 with COPD exacerbation, acute on chronic diastolic CHF She was given IV Lasix with worsening renal dysfunction also given IV steroids, nebulizers with good improvement of her shortness of breath Baseline creatinine before diuresis was 1.25. After diuresis was 1.96  Carotid ultrasound in the November 2012 has shown 40-59% bilateral carotid disease.  LE u/s 11/2011 suggest  50% distal aorta disease, right common iliac artery with equal or less than 50% disease, left common iliac with equal or greater than 60% disease. no severe renal artery stenosis  No Known Allergies  Outpatient Encounter Prescriptions as of 07/11/2015  Medication Sig   . aspirin 81 MG tablet Take 2 tablets (162 mg total) by mouth daily.  . furosemide (LASIX) 40 MG tablet Take 1 tablet (40 mg total) by mouth 2 (two) times daily as needed.  Marland Kitchen glipiZIDE (GLUCOTROL) 5 MG tablet Take 5 mg by mouth daily before breakfast.   . hydrALAZINE (APRESOLINE) 25 MG tablet Take 25 mg by mouth 2 (two) times daily.   . Insulin Detemir (LEVEMIR) 100 UNIT/ML Pen Inject 3-6 Units into the skin daily at 10 pm.   . isosorbide mononitrate (IMDUR) 60 MG 24 hr tablet TAKE ONE-HALF TABLET BY MOUTH ONCE DAILY  . meloxicam (MOBIC) 7.5 MG tablet Take 7.5 mg by mouth 2 (two) times daily.   . nitroGLYCERIN (NITROSTAT) 0.4 MG SL tablet Place 0.4 mg under the tongue every 5 (five) minutes as needed.    . polyethylene glycol (MIRALAX / GLYCOLAX) packet Take 17 g by mouth once a week.  . Probiotic Product (HEALTHY COLON PO) Take by mouth as needed.  . simvastatin (ZOCOR) 20 MG tablet Take 1 tablet (20 mg total) by mouth daily.  . [DISCONTINUED] pantoprazole (PROTONIX) 40 MG tablet Take 40 mg by mouth 2 (two) times daily.    No facility-administered encounter medications on file as of 07/11/2015.    Past Medical History  Diagnosis Date  . COPD (chronic obstructive pulmonary disease)   . PVD (peripheral vascular disease)   . Diabetes mellitus   . Hypertension   . Hyperlipidemia   . Coronary artery disease 2008    CABG x 4,   . Chronic diastolic heart failure     with pulmonary HTN  . Chronic kidney  disease   . Spinal stenosis     Past Surgical History  Procedure Laterality Date  . Cardiac catheterization      PTCA x 3  . Coronary artery bypass graft  2008    x 4  . Tonsillectomy      Social History  reports that she quit smoking about 8 years ago. Her smoking use included Cigarettes. She has a 50 pack-year smoking history. She does not have any smokeless tobacco history on file. She reports that she does not drink alcohol or use illicit drugs.  Family History family history  includes Heart disease in her father and sister.  Review of Systems  Constitutional: Negative.   Respiratory: Negative.   Cardiovascular: Negative.   Gastrointestinal: Negative.   Musculoskeletal: Positive for back pain and gait problem.  Skin: Negative.   Allergic/Immunologic: Negative.   Neurological: Negative.   Hematological: Negative.   Psychiatric/Behavioral: Negative.   All other systems reviewed and are negative.  BP 110/54 mmHg  Pulse 85  Ht '5\' 4"'$  (1.626 m)  Wt 134 lb 4 oz (60.895 kg)  BMI 23.03 kg/m2  Physical Exam  Constitutional: She is oriented to person, place, and time. She appears well-developed and well-nourished.  HENT:  Head: Normocephalic.  Nose: Nose normal.  Mouth/Throat: Oropharynx is clear and moist.  Eyes: Conjunctivae are normal. Pupils are equal, round, and reactive to light.  Neck: Normal range of motion. Neck supple. No JVD present.  Cardiovascular: Normal rate, regular rhythm, S1 normal, S2 normal and intact distal pulses.  Exam reveals no gallop and no friction rub.   Murmur heard.  Crescendo systolic murmur is present with a grade of 2/6  Pulmonary/Chest: Effort normal and breath sounds normal. No respiratory distress. She has no wheezes. She has no rales. She exhibits no tenderness.  Mildly decreased breath sounds bilaterally throughout.  Abdominal: Soft. Bowel sounds are normal. She exhibits no distension. There is no tenderness.  Musculoskeletal: Normal range of motion. She exhibits no edema or tenderness.  Lymphadenopathy:    She has no cervical adenopathy.  Neurological: She is alert and oriented to person, place, and time. Coordination normal.  Skin: Skin is warm and dry. No rash noted. No erythema.  Psychiatric: She has a normal mood and affect. Her behavior is normal. Judgment and thought content normal.    Assessment and Plan  Nursing note and vitals reviewed.

## 2015-07-11 NOTE — Assessment & Plan Note (Signed)
Management Dr. Hardin Negus We have encouraged continued careful diet management in an effort to lose weight.

## 2015-07-11 NOTE — Assessment & Plan Note (Signed)
Moderate disease/stenosis of the bilateral iliacs We'll discuss repeat ultrasound with her on her next visit

## 2015-07-11 NOTE — Assessment & Plan Note (Signed)
She is currently on Lasix 40 mg daily. No potassium BMP drawn today as prior labs May 2015 showing mild dehydration with low potassium

## 2015-07-12 LAB — BASIC METABOLIC PANEL
BUN/Creatinine Ratio: 16 (ref 11–26)
BUN: 32 mg/dL — ABNORMAL HIGH (ref 8–27)
CALCIUM: 8.6 mg/dL — AB (ref 8.7–10.3)
CHLORIDE: 91 mmol/L — AB (ref 97–108)
CO2: 22 mmol/L (ref 18–29)
Creatinine, Ser: 2.05 mg/dL — ABNORMAL HIGH (ref 0.57–1.00)
GFR, EST AFRICAN AMERICAN: 25 mL/min/{1.73_m2} — AB (ref >59)
GFR, EST NON AFRICAN AMERICAN: 21 mL/min/{1.73_m2} — AB (ref >59)
Glucose: 341 mg/dL — ABNORMAL HIGH (ref 65–99)
Potassium: 4.1 mmol/L (ref 3.5–5.2)
Sodium: 129 mmol/L — ABNORMAL LOW (ref 134–144)

## 2015-07-26 DIAGNOSIS — I10 Essential (primary) hypertension: Secondary | ICD-10-CM | POA: Diagnosis not present

## 2015-07-26 DIAGNOSIS — M4806 Spinal stenosis, lumbar region: Secondary | ICD-10-CM | POA: Diagnosis not present

## 2015-08-07 DIAGNOSIS — E119 Type 2 diabetes mellitus without complications: Secondary | ICD-10-CM | POA: Diagnosis not present

## 2015-08-27 ENCOUNTER — Other Ambulatory Visit: Payer: Self-pay | Admitting: Cardiovascular Disease

## 2015-08-31 ENCOUNTER — Ambulatory Visit (INDEPENDENT_AMBULATORY_CARE_PROVIDER_SITE_OTHER): Payer: Medicare Other | Admitting: Podiatry

## 2015-08-31 DIAGNOSIS — B351 Tinea unguium: Secondary | ICD-10-CM

## 2015-08-31 DIAGNOSIS — E118 Type 2 diabetes mellitus with unspecified complications: Secondary | ICD-10-CM | POA: Diagnosis not present

## 2015-08-31 DIAGNOSIS — E119 Type 2 diabetes mellitus without complications: Secondary | ICD-10-CM

## 2015-08-31 DIAGNOSIS — M79676 Pain in unspecified toe(s): Secondary | ICD-10-CM | POA: Diagnosis not present

## 2015-08-31 NOTE — Progress Notes (Signed)
Patient ID: KARLE DESROSIER, female   DOB: 1925/03/03, 81 y.o.   MRN: [id number]  Subjective: 81 y.o.-year-old female returns the office today for painful, elongated, thickened toenails which she is unable to trim herself.  Denies any redness or drainage around the nails. Denies any acute changes since last appointment and no new complaints today. Denies any systemic complaints such as fevers, chills, nausea, vomiting.   She has received steroid injections in her back for spinal stenosis.   Objective: AAO 3, NAD DP/PT pulses palpable, CRT less than 3 seconds Nails hypertrophic, dystrophic, elongated, brittle, discolored 9. There is tenderness over nails 1-5 bilaterally except the left hallux. The left hallux nail has previously been removed. There is mild incurvation of all the nails. There is no surrounding erythema or drainage along the nail sites. No open lesions or pre-ulcerative lesions are identified. No other areas of tenderness bilateral lower extremities. No overlying edema, erythema, increased warmth. No pain with calf compression, swelling, warmth, erythema.  Assessment: Patient presents with symptomatic onychomycosis  Plan: -Treatment options including alternatives, risks, complications were discussed -Nails sharply debrided x 9 without complication/bleeding. -Discussed daily foot inspection. If there are any changes, to call the office immediately.  -Follow-up in 3 months or sooner if any problems are to arise. In the meantime, encouraged to call the office with any questions, concerns, changes symptoms.   Celesta Gentile, DPM

## 2015-09-06 DIAGNOSIS — Z23 Encounter for immunization: Secondary | ICD-10-CM | POA: Diagnosis not present

## 2015-10-05 ENCOUNTER — Emergency Department
Admission: EM | Admit: 2015-10-05 | Discharge: 2015-10-05 | Disposition: A | Discharge status: Home / Self Care | Payer: Medicare Other | Attending: Emergency Medicine | Admitting: Emergency Medicine

## 2015-10-05 DIAGNOSIS — Z87891 Personal history of nicotine dependence: Secondary | ICD-10-CM | POA: Diagnosis not present

## 2015-10-05 DIAGNOSIS — R531 Weakness: Secondary | ICD-10-CM | POA: Insufficient documentation

## 2015-10-05 DIAGNOSIS — E1165 Type 2 diabetes mellitus with hyperglycemia: Secondary | ICD-10-CM | POA: Insufficient documentation

## 2015-10-05 DIAGNOSIS — I129 Hypertensive chronic kidney disease with stage 1 through stage 4 chronic kidney disease, or unspecified chronic kidney disease: Secondary | ICD-10-CM | POA: Insufficient documentation

## 2015-10-05 DIAGNOSIS — R739 Hyperglycemia, unspecified: Secondary | ICD-10-CM

## 2015-10-05 DIAGNOSIS — I959 Hypotension, unspecified: Secondary | ICD-10-CM | POA: Diagnosis not present

## 2015-10-05 DIAGNOSIS — N189 Chronic kidney disease, unspecified: Secondary | ICD-10-CM | POA: Insufficient documentation

## 2015-10-05 DIAGNOSIS — E876 Hypokalemia: Secondary | ICD-10-CM | POA: Insufficient documentation

## 2015-10-05 LAB — CBC WITH DIFFERENTIAL/PLATELET
BASOS ABS: 0.1 10*3/uL (ref 0–0.1)
BASOS PCT: 1 %
EOS ABS: 0.1 10*3/uL (ref 0–0.7)
EOS PCT: 1 %
HCT: 37.9 % (ref 35.0–47.0)
Hemoglobin: 12.5 g/dL (ref 12.0–16.0)
LYMPHS PCT: 8 %
Lymphs Abs: 0.6 10*3/uL — ABNORMAL LOW (ref 1.0–3.6)
MCH: 30.7 pg (ref 26.0–34.0)
MCHC: 33.1 g/dL (ref 32.0–36.0)
MCV: 92.7 fL (ref 80.0–100.0)
MONO ABS: 0.4 10*3/uL (ref 0.2–0.9)
Monocytes Relative: 5 %
Neutro Abs: 7 10*3/uL — ABNORMAL HIGH (ref 1.4–6.5)
Neutrophils Relative %: 85 %
PLATELETS: 144 10*3/uL — AB (ref 150–440)
RBC: 4.09 MIL/uL (ref 3.80–5.20)
RDW: 12.8 % (ref 11.5–14.5)
WBC: 8.2 10*3/uL (ref 3.6–11.0)

## 2015-10-05 LAB — COMPREHENSIVE METABOLIC PANEL
ALBUMIN: 3.7 g/dL (ref 3.5–5.0)
ALT: 16 U/L (ref 14–54)
AST: 25 U/L (ref 15–41)
Alkaline Phosphatase: 80 U/L (ref 38–126)
Anion gap: 9 (ref 5–15)
BUN: 24 mg/dL — AB (ref 6–20)
CHLORIDE: 102 mmol/L (ref 101–111)
CO2: 24 mmol/L (ref 22–32)
CREATININE: 1.55 mg/dL — AB (ref 0.44–1.00)
Calcium: 8.6 mg/dL — ABNORMAL LOW (ref 8.9–10.3)
GFR calc Af Amer: 34 mL/min — ABNORMAL LOW (ref >60)
GFR calc non Af Amer: 29 mL/min — ABNORMAL LOW (ref >60)
Glucose, Bld: 320 mg/dL — ABNORMAL HIGH (ref 65–99)
POTASSIUM: 2.9 mmol/L — AB (ref 3.5–5.1)
SODIUM: 135 mmol/L (ref 135–145)
Total Bilirubin: 0.7 mg/dL (ref 0.3–1.2)
Total Protein: 6.1 g/dL — ABNORMAL LOW (ref 6.5–8.1)

## 2015-10-05 LAB — GLUCOSE, CAPILLARY: Glucose-Capillary: 311 mg/dL — ABNORMAL HIGH (ref 65–99)

## 2015-10-05 LAB — TROPONIN I: Troponin I: <0.03 ng/mL (ref <0.031)

## 2015-10-05 MED ORDER — POTASSIUM CHLORIDE CRYS ER 20 MEQ PO TBCR
40.0000 meq | EXTENDED_RELEASE_TABLET | Freq: Once | ORAL | Status: AC
  Administered 2015-10-05: 40 meq via ORAL
  Filled 2015-10-05: qty 2

## 2015-10-05 MED ORDER — SODIUM CHLORIDE 0.9 % IV SOLN
1000.0000 mL | Freq: Once | INTRAVENOUS | Status: AC
  Administered 2015-10-05: 1000 mL via INTRAVENOUS

## 2015-10-05 NOTE — ED Notes (Signed)
Pt still unable to urinate - dr aware. Order to be cancelled on discharge

## 2015-10-05 NOTE — Discharge Instructions (Signed)
Hyperglycemia °Hyperglycemia occurs when the glucose (sugar) in your blood is too high. Hyperglycemia can happen for many reasons, but it most often happens to people who do not know they have diabetes or are not managing their diabetes properly.  °CAUSES  °Whether you have diabetes or not, there are other causes of hyperglycemia. Hyperglycemia can occur when you have diabetes, but it can also occur in other situations that you might not be as aware of, such as: °Diabetes °· If you have diabetes and are having problems controlling your blood glucose, hyperglycemia could occur because of some of the following reasons: °· Not following your meal plan. °· Not taking your diabetes medications or not taking it properly. °· Exercising less or doing less activity than you normally do. °· Being sick. °Pre-diabetes °· This cannot be ignored. Before people develop Type 2 diabetes, they almost always have "pre-diabetes." This is when your blood glucose levels are higher than normal, but not yet high enough to be diagnosed as diabetes. Research has shown that some long-term damage to the body, especially the heart and circulatory system, may already be occurring during pre-diabetes. If you take action to manage your blood glucose when you have pre-diabetes, you may delay or prevent Type 2 diabetes from developing. °Stress °· If you have diabetes, you may be "diet" controlled or on oral medications or insulin to control your diabetes. However, you may find that your blood glucose is higher than usual in the hospital whether you have diabetes or not. This is often referred to as "stress hyperglycemia." Stress can elevate your blood glucose. This happens because of hormones put out by the body during times of stress. If stress has been the cause of your high blood glucose, it can be followed regularly by your caregiver. That way he/she can make sure your hyperglycemia does not continue to get worse or progress to  diabetes. °Steroids °· Steroids are medications that act on the infection fighting system (immune system) to block inflammation or infection. One side effect can be a rise in blood glucose. Most people can produce enough extra insulin to allow for this rise, but for those who cannot, steroids make blood glucose levels go even higher. It is not unusual for steroid treatments to "uncover" diabetes that is developing. It is not always possible to determine if the hyperglycemia will go away after the steroids are stopped. A special blood test called an A1c is sometimes done to determine if your blood glucose was elevated before the steroids were started. °SYMPTOMS °· Thirsty. °· Frequent urination. °· Dry mouth. °· Blurred vision. °· Tired or fatigue. °· Weakness. °· Sleepy. °· Tingling in feet or leg. °DIAGNOSIS  °Diagnosis is made by monitoring blood glucose in one or all of the following ways: °· A1c test. This is a chemical found in your blood. °· Fingerstick blood glucose monitoring. °· Laboratory results. °TREATMENT  °First, knowing the cause of the hyperglycemia is important before the hyperglycemia can be treated. Treatment may include, but is not be limited to: °· Education. °· Change or adjustment in medications. °· Change or adjustment in meal plan. °· Treatment for an illness, infection, etc. °· More frequent blood glucose monitoring. °· Change in exercise plan. °· Decreasing or stopping steroids. °· Lifestyle changes. °HOME CARE INSTRUCTIONS  °· Test your blood glucose as directed. °· Exercise regularly. Your caregiver will give you instructions about exercise. Pre-diabetes or diabetes which comes on with stress is helped by exercising. °· Eat wholesome,   balanced meals. Eat often and at regular, fixed times. Your caregiver or nutritionist will give you a meal plan to guide your sugar intake. °· Being at an ideal weight is important. If needed, losing as little as 10 to 15 pounds may help improve blood  glucose levels. °SEEK MEDICAL CARE IF:  °· You have questions about medicine, activity, or diet. °· You continue to have symptoms (problems such as increased thirst, urination, or weight gain). °SEEK IMMEDIATE MEDICAL CARE IF:  °· You are vomiting or have diarrhea. °· Your breath smells fruity. °· You are breathing faster or slower. °· You are very sleepy or incoherent. °· You have numbness, tingling, or pain in your feet or hands. °· You have chest pain. °· Your symptoms get worse even though you have been following your caregiver's orders. °· If you have any other questions or concerns. °  °This information is not intended to replace advice given to you by your health care provider. Make sure you discuss any questions you have with your health care provider. °  °Document Released: 05/28/2001 Document Revised: 02/24/2012 Document Reviewed: 08/08/2015 °Elsevier Interactive Patient Education ©2016 [employer] °Weakness °Weakness is a lack of strength. It may be felt all over the body (generalized) or in one specific part of the body (focal). Some causes of weakness can be serious. You may need further medical evaluation, especially if you are elderly or you have a history of immunosuppression (such as chemotherapy or HIV), kidney disease, heart disease, or diabetes. °CAUSES  °Weakness can be caused by many different things, including: °· Infection. °· Physical exhaustion. °· Internal bleeding or other blood loss that results in a lack of red blood cells (anemia). °· Dehydration. This cause is more common in elderly people. °· Side effects or electrolyte abnormalities from medicines, such as pain medicines or sedatives. °· Emotional distress, anxiety, or depression. °· Circulation problems, especially severe peripheral arterial disease. °· Heart disease, such as rapid atrial fibrillation, bradycardia, or heart failure. °· Nervous system disorders, such as Guillain-Barré syndrome, multiple sclerosis, or  stroke. °DIAGNOSIS  °To find the cause of your weakness, your caregiver will take your history and perform a physical exam. Lab tests or X-rays may also be ordered, if needed. °TREATMENT  °Treatment of weakness depends on the cause of your symptoms and can vary greatly. °HOME CARE INSTRUCTIONS  °· Rest as needed. °· Eat a well-balanced diet. °· Try to get some exercise every day. °· Only take over-the-counter or prescription medicines as directed by your caregiver. °SEEK MEDICAL CARE IF:  °· Your weakness seems to be getting worse or spreads to other parts of your body. °· You develop new aches or pains. °SEEK IMMEDIATE MEDICAL CARE IF:  °· You cannot perform your normal daily activities, such as getting dressed and feeding yourself. °· You cannot walk up and down stairs, or you feel exhausted when you do so. °· You have shortness of breath or chest pain. °· You have difficulty moving parts of your body. °· You have weakness in only one area of the body or on only one side of the body. °· You have a fever. °· You have trouble speaking or swallowing. °· You cannot control your bladder or bowel movements. °· You have black or bloody vomit or stools. °MAKE SURE YOU: °· Understand these instructions. °· Will watch your condition. °· Will get help right away if you are not doing well or get worse. °  °This information is not   intended to replace advice given to you by your health care provider. Make sure you discuss any questions you have with your health care provider. °  °Document Released: 12/02/2005 Document Revised: 06/02/2012 Document Reviewed: 01/31/2012 °Elsevier Interactive Patient Education ©2016 Else[employer] Inc[employer] ° °

## 2015-10-05 NOTE — ED Notes (Signed)
Patient brought in via EMS from home for weakness.  EMS vitals:  BP 95/50, HR 56.  Blood sugar 336.

## 2015-10-05 NOTE — ED Provider Notes (Signed)
[facility] Emergency Department Provider Note     Time seen: ----------------------------------------- 9:05 AM on 10/05/2015 -----------------------------------------    I have reviewed the triage vital signs and the nursing notes.   HISTORY  Chief Complaint No chief complaint on file.    HPI Donna Lane is a 81 y.o. female who presents to ER for weakness. Patient notes this morning she felt like she was too weak to stand, denies any fevers, chills, chest pain, shortness of breath, nausea vomiting or diarrhea. Patient states she's been visiting the hospital almost every day for the last 10 days due to her husband being here. Patient thinks she's just "worn out". Patient denies any complaints, nothing makes her symptoms better.   Past Medical History  Diagnosis Date  . COPD (chronic obstructive pulmonary disease)   . PVD (peripheral vascular disease)   . Diabetes mellitus   . Hypertension   . Hyperlipidemia   . Coronary artery disease 2008    CABG x 4,   . Chronic diastolic heart failure     with pulmonary HTN  . Chronic kidney disease   . Spinal stenosis     Patient Active Problem List   Diagnosis Date Noted  . Chronic diastolic CHF (congestive heart failure) ([place]) 10/12/2013  . Diabetes mellitus type 2, controlled, with complications ([place]) 51/01/5851  . Hyperlipidemia 03/14/2010  . Essential hypertension 03/14/2010  . CORONARY ATHEROSLERO AUTOL VEIN BYPASS GRAFT 03/14/2010  . Peripheral arterial disease ([place]) 03/14/2010  . CHEST PAIN-UNSPECIFIED 03/14/2010    Past Surgical History  Procedure Laterality Date  . Cardiac catheterization      PTCA x 3  . Coronary artery bypass graft  2008    x 4  . Tonsillectomy      Allergies Review of patient's allergies indicates no known allergies.  Social History Social History  Substance Use Topics  . Smoking status: Former Smoker -- 1.00 packs/day for 50 years    Types: Cigarettes    Quit date: 03/20/2007  . Smokeless tobacco: Not on file  . Alcohol Use: No    Review of Systems Constitutional: Negative for fever. Eyes: Negative for visual changes. ENT: Negative for sore throat. Cardiovascular: Negative for chest pain. Respiratory: Negative for shortness of breath. Gastrointestinal: Negative for abdominal pain, vomiting and diarrhea. Genitourinary: Negative for dysuria. Musculoskeletal: Negative for back pain. Skin: Negative for rash. Neurological: Negative for headaches, positive for generalized weakness  10-point ROS otherwise negative.  ____________________________________________   PHYSICAL EXAM:  VITAL SIGNS: ED Triage Vitals  Enc Vitals Group     BP --      Pulse --      Resp --      Temp --      Temp src --      SpO2 --      Weight --      Height --      Head Cir --      Peak Flow --      Pain Score --      Pain Loc --      Pain Edu? --      Excl. in [place]? --     Constitutional: Alert and oriented. Well appearing and in no distress. Eyes: Conjunctivae are normal. PERRL. Normal extraocular movements. ENT   Head: Normocephalic and atraumatic.   Nose: No congestion/rhinnorhea.   Mouth/Throat: Mucous membranes are moist.   Neck: No stridor. Cardiovascular: Normal rate, regular rhythm. Normal and symmetric distal pulses are  present in all extremities. No murmurs, rubs, or gallops. Respiratory: Normal respiratory effort without tachypnea nor retractions. Breath sounds are clear and equal bilaterally. No wheezes/rales/rhonchi. Gastrointestinal: Soft and nontender. No distention. No abdominal bruits.  Musculoskeletal: Nontender with normal range of motion in all extremities. No joint effusions.  No lower extremity tenderness nor edema. Neurologic:  Normal speech and language. No gross focal neurologic deficits are appreciated. Speech is normal. No gait instability. Skin:  Skin is warm, dry and intact. No rash noted. Psychiatric:  Mood and affect are normal. Speech and behavior are normal. Patient exhibits appropriate insight and judgment.  ____________________________________________  ED COURSE:  Pertinent labs & imaging results that were available during my care of the patient were reviewed by me and considered in my medical decision making (see chart for details). Weakness, unclear etiology. Will check labs and EKG. She may need gentle hydration ____________________________________________    LABS (pertinent positives/negatives)  Labs Reviewed  CBC WITH DIFFERENTIAL/PLATELET - Abnormal; Notable for the following:    Platelets 144 (*)    Neutro Abs 7.0 (*)    Lymphs Abs 0.6 (*)    All other components within normal limits  COMPREHENSIVE METABOLIC PANEL - Abnormal; Notable for the following:    Potassium 2.9 (*)    Glucose, Bld 320 (*)    BUN 24 (*)    Creatinine, Ser 1.55 (*)    Calcium 8.6 (*)    Total Protein 6.1 (*)    GFR calc non Af Amer 29 (*)    GFR calc Af Amer 34 (*)    All other components within normal limits  GLUCOSE, CAPILLARY - Abnormal; Notable for the following:    Glucose-Capillary 311 (*)    All other components within normal limits  TROPONIN I  URINALYSIS COMPLETEWITH MICROSCOPIC ([facility] ONLY)   ____________________________________________  FINAL ASSESSMENT AND PLAN  Weakness, hyperglycemia, hypokalemia  Plan: Patient with labs and imaging as dictated above. Patient with mild hypokalemia treated with oral potassium. She was also given a liter of saline, currently she feels better. Admits to being fatigued from caring for her husband as well as not eating properly. She is stable for outpatient follow-up and does not want stay in the hospital.   Earleen Newport, MD   Earleen Newport, MD 10/05/15 [phone]

## 2015-11-23 ENCOUNTER — Encounter: Payer: Self-pay | Admitting: Primary Care

## 2015-11-23 ENCOUNTER — Ambulatory Visit (INDEPENDENT_AMBULATORY_CARE_PROVIDER_SITE_OTHER): Payer: Medicare Other | Admitting: Primary Care

## 2015-11-23 VITALS — BP 144/70 | HR 86 | Temp 98.4°F | Ht 64.0 in | Wt 129.8 lb

## 2015-11-23 DIAGNOSIS — R490 Dysphonia: Secondary | ICD-10-CM | POA: Diagnosis not present

## 2015-11-23 NOTE — Patient Instructions (Signed)
  Runny nose: Claritin or Zyrtec antihistamine. This may be purchased over the counter.  Cough: Robitussin DM. This well help with cough and any congestion you may develop.  Try not to strain your voice if possible. Ensure you are staying hydrated with water.  It was a pleasure meeting you!

## 2015-11-23 NOTE — Progress Notes (Signed)
Subjective:    Patient ID: Donna Lane, female    DOB: Jun 13, 1925, 81 y.o.   MRN: [id number]  HPI  Donna Lane is an 81 year old female new patient who presents today with a chief complaint of voice hoarsness. She also reports a tickle to her throat, dry cough, and rhinorrhea. She is to establish care in January with Dr. Damita Dunnings. She first noticed her symptoms yesterday. Denies fevers, chills, sick contacts, fatigue, and is not managed on an ACE. She doesn't feel sick. She's been caring for her husband who had surgery in October. Her husband can't hear very well so she's been straining her voice. She took a "swallow of [place] with some lemon juice" last night to help with sleep, otherwise she's not taken anything OTC.  Review of Systems  Constitutional: Negative for fever, chills and fatigue.  HENT: Positive for voice change. Negative for congestion, ear pain and sore throat.   Respiratory: Positive for cough. Negative for shortness of breath.   Cardiovascular: Negative for chest pain.  Gastrointestinal: Negative for nausea.  Musculoskeletal: Negative for myalgias.  Allergic/Immunologic: Negative for environmental allergies.  Neurological: Negative for headaches.       Past Medical History  Diagnosis Date  . COPD (chronic obstructive pulmonary disease) ([place])   . PVD (peripheral vascular disease) ([place])   . Diabetes mellitus   . Hypertension   . Hyperlipidemia   . Coronary artery disease 2008    CABG x 4,   . Chronic diastolic heart failure (HCC)     with pulmonary HTN  . Chronic kidney disease   . Spinal stenosis     Social History   Social History  . Marital Status: Married    Spouse Name: N/A  . Number of Children: N/A  . Years of Education: N/A   Occupational History  . Not on file.   Social History Main Topics  . Smoking status: Former Smoker -- 1.00 packs/day for 50 years    Types: Cigarettes    Quit date: 03/20/2007  . Smokeless tobacco: Not on  file  . Alcohol Use: No  . Drug Use: No  . Sexual Activity: Not on file   Other Topics Concern  . Not on file   Social History Narrative    Past Surgical History  Procedure Laterality Date  . Cardiac catheterization      PTCA x 3  . Coronary artery bypass graft  2008    x 4  . Tonsillectomy      Family History  Problem Relation Age of Onset  . Heart disease Father   . Heart disease Sister     No Known Allergies  Current Outpatient Prescriptions on File Prior to Visit  Medication Sig Dispense Refill  . aspirin EC 81 MG tablet Take 81-162 mg by mouth at bedtime. 1 tablet every other day alternating with 2 tablets every other day    . furosemide (LASIX) 40 MG tablet Take 40 mg by mouth at bedtime as needed for fluid or edema.    Marland Kitchen glipiZIDE (GLUCOTROL XL) 5 MG 24 hr tablet Take 5 mg by mouth every morning.    . hydrALAZINE (APRESOLINE) 25 MG tablet Take 25 mg by mouth daily.     . Insulin Detemir (LEVEMIR) 100 UNIT/ML Pen Inject 3-6 Units into the skin at bedtime as needed (for hyperglycemia).     . isosorbide mononitrate (IMDUR) 60 MG 24 hr tablet TAKE ONE-HALF TABLET BY MOUTH ONCE DAILY  30 tablet 6  . meloxicam (MOBIC) 7.5 MG tablet Take 7.5 mg by mouth 2 (two) times daily.    . nitroGLYCERIN (NITROSTAT) 0.4 MG SL tablet Place 0.4 mg under the tongue every 5 (five) minutes as needed for chest pain.     . simvastatin (ZOCOR) 20 MG tablet Take 1 tablet (20 mg total) by mouth daily. 30 tablet 6   No current facility-administered medications on file prior to visit.    BP 144/70 mmHg  Pulse 86  Temp(Src) 98.4 F (36.9 C) (Oral)  Ht '5\' 4"'$  (1.626 m)  Wt 129 lb 12.8 oz (58.877 kg)  BMI 22.27 kg/m2  SpO2 98%    Objective:   Physical Exam  Constitutional: She appears well-nourished. She does not appear ill.  HENT:  Right Ear: Tympanic membrane and ear canal normal.  Left Ear: Tympanic membrane and ear canal normal.  Nose: Nose normal. Right sinus exhibits no maxillary  sinus tenderness and no frontal sinus tenderness. Left sinus exhibits no maxillary sinus tenderness and no frontal sinus tenderness.  Mouth/Throat: No oropharyngeal exudate, posterior oropharyngeal edema or posterior oropharyngeal erythema.  Cardiovascular: Normal rate.   Pulmonary/Chest: Effort normal and breath sounds normal. She has no decreased breath sounds. She has no wheezes. She has no rhonchi.  Skin: Skin is warm and dry.          Assessment & Plan:  Voice hoarsness:  Voice hoarsness, cough, and rhinorrhea since yesterday. She's been straining her voice lately as her husband cannot hear well. Tickle to her throat causing cough today.  No sick contacts, doesn't feel sick, or fatigued. Not managed on ACE. Exam unremarkable. Lungs clear, HENT WNL. Suggested OTC antihistamine and sugar free robitussin for symptoms. Voice rest if possible.  Discussed to return if fevers, weakness, cough, etc. She verbalized understanding.

## 2015-11-23 NOTE — Progress Notes (Signed)
Pre visit review using our clinic review tool, if applicable. No additional management support is needed unless otherwise documented below in the visit note. 

## 2015-11-27 ENCOUNTER — Other Ambulatory Visit: Payer: Self-pay | Admitting: *Deleted

## 2015-11-27 NOTE — Telephone Encounter (Signed)
Last Filled:   11/20/15 for #9.

## 2015-11-27 NOTE — Telephone Encounter (Signed)
Patient requests refills while here with husband as a new patient.  Please advise. Last Filled:   11/20/15 ? Quantity.

## 2015-11-28 MED ORDER — HYDRALAZINE HCL 25 MG PO TABS: 25.0000 mg | ORAL_TABLET | Freq: Three times a day (TID) | ORAL | Status: DC

## 2015-11-28 NOTE — Telephone Encounter (Signed)
Sent.  Thanks.  I don't want her to run out before her OV.

## 2015-11-30 ENCOUNTER — Ambulatory Visit: Payer: Medicare Other

## 2015-12-01 ENCOUNTER — Ambulatory Visit (INDEPENDENT_AMBULATORY_CARE_PROVIDER_SITE_OTHER): Payer: Medicare Other | Admitting: Sports Medicine

## 2015-12-01 ENCOUNTER — Encounter: Payer: Self-pay | Admitting: Sports Medicine

## 2015-12-01 DIAGNOSIS — M79676 Pain in unspecified toe(s): Secondary | ICD-10-CM | POA: Diagnosis not present

## 2015-12-01 DIAGNOSIS — E119 Type 2 diabetes mellitus without complications: Secondary | ICD-10-CM

## 2015-12-01 DIAGNOSIS — Z794 Long term (current) use of insulin: Secondary | ICD-10-CM

## 2015-12-01 DIAGNOSIS — B351 Tinea unguium: Secondary | ICD-10-CM

## 2015-12-01 NOTE — Progress Notes (Signed)
Patient ID: CORALEE EDBERG, female   DOB: [birth year], 81 y.o.   MRN: [id number] Subjective: JUDY POLLMAN is a 81 y.o. female patient with history of type 2 diabetes who presents to office today complaining of long, painful nails  while ambulating in shoes; unable to trim. Patient states that the glucose reading this morning was ~90 mg/dl. Patient denies any new changes in medication or new problems. Patient denies any new cramping, numbness, burning or tingling in the legs.  Patient Active Problem List   Diagnosis Date Noted  . Chronic diastolic CHF (congestive heart failure) ([place]) 10/12/2013  . Diabetes mellitus type 2, controlled, with complications ([place]) 78/29/5621  . Hyperlipidemia 03/14/2010  . Essential hypertension 03/14/2010  . CORONARY ATHEROSLERO AUTOL VEIN BYPASS GRAFT 03/14/2010  . Peripheral arterial disease ([place]) 03/14/2010  . CHEST PAIN-UNSPECIFIED 03/14/2010   Current Outpatient Prescriptions on File Prior to Visit  Medication Sig Dispense Refill  . aspirin EC 81 MG tablet Take 81-162 mg by mouth at bedtime. 1 tablet every other day alternating with 2 tablets every other day    . furosemide (LASIX) 40 MG tablet Take 40 mg by mouth at bedtime as needed for fluid or edema.    Marland Kitchen glipiZIDE (GLUCOTROL XL) 5 MG 24 hr tablet Take 5 mg by mouth every morning.    . hydrALAZINE (APRESOLINE) 25 MG tablet Take 1 tablet (25 mg total) by mouth 3 (three) times daily. 90 tablet 1  . Insulin Detemir (LEVEMIR) 100 UNIT/ML Pen Inject 3-6 Units into the skin at bedtime as needed (for hyperglycemia).     . isosorbide mononitrate (IMDUR) 60 MG 24 hr tablet TAKE ONE-HALF TABLET BY MOUTH ONCE DAILY 30 tablet 6  . meloxicam (MOBIC) 7.5 MG tablet Take 7.5 mg by mouth 2 (two) times daily.    . nitroGLYCERIN (NITROSTAT) 0.4 MG SL tablet Place 0.4 mg under the tongue every 5 (five) minutes as needed for chest pain.     . simvastatin (ZOCOR) 20 MG tablet Take 1 tablet (20 mg total) by mouth daily.  30 tablet 6   No current facility-administered medications on file prior to visit.   No Known Allergies   Labs: HEMOGLOBIN A1C- No recent lab on file  Objective: General: Patient is awake, alert, and oriented x 3 and in no acute distress.  Integument: Skin is warm, dry and supple bilateral. Nails are tender, long, thickened and  dystrophic with subungual debris, consistent with onychomycosis, 1-5 bilateral. No signs of infection. No open lesions or preulcerative lesions present bilateral. Remaining integument unremarkable.  Vasculature:  Dorsalis Pedis pulse 2/4 bilateral. Posterior Tibial pulse  1/4 bilateral.  Capillary fill time <3 sec 1-5 bilateral. Scant hair growth to the level of the digits. Temperature gradient within normal limits. Mild varicosities present bilateral. No edema present bilateral.   Neurology: The patient has intact sensation measured with a 5.07/10g Semmes Weinstein Monofilament at all pedal sites bilateral . Vibratory sensation diminished bilateral with tuning fork. No Babinski sign present bilateral.   Musculoskeletal: No gross pedal deformities noted bilateral. Muscular strength 5/5 in all lower extremity muscular groups bilateral without pain or limitation on range of motion . No tenderness with calf compression bilateral.  Assessment and Plan: Problem List Items Addressed This Visit    None    Visit Diagnoses    Pain due to onychomycosis of toenail    -  Primary    Type 2 diabetes mellitus without complication, with long-term current use of insulin (HCC)          -  Examined patient. -Discussed and educated patient on diabetic foot care, especially with  regards to the vascular, neurological and musculoskeletal systems.  -Stressed the importance of good glycemic control and the detriment of not  controlling glucose levels in relation to the foot. -Mechanically debrided all nails 1-5 bilateral using sterile nail nipper and filed with dremel without  incident  -Answered all patient questions -Patient to return in 3 months for at risk foot care -Patient advised to call the office if any problems or questions arise in the meantime.  Landis Martins, DPM

## 2015-12-05 ENCOUNTER — Other Ambulatory Visit: Payer: Self-pay | Admitting: *Deleted

## 2015-12-05 NOTE — Telephone Encounter (Signed)
Faxed refill request. Last Filled:   #[phone] on 11/02/2015 from previous provider.  Please advise.

## 2015-12-06 MED ORDER — MELOXICAM 7.5 MG PO TABS: 7.5000 mg | ORAL_TABLET | Freq: Two times a day (BID) | ORAL | Status: DC

## 2016-01-03 ENCOUNTER — Ambulatory Visit (INDEPENDENT_AMBULATORY_CARE_PROVIDER_SITE_OTHER): Payer: Medicare Other | Admitting: Family Medicine

## 2016-01-03 ENCOUNTER — Encounter: Payer: Self-pay | Admitting: Family Medicine

## 2016-01-03 VITALS — BP 112/62 | HR 90 | Temp 98.3°F | Ht 63.0 in | Wt 128.5 lb

## 2016-01-03 DIAGNOSIS — E118 Type 2 diabetes mellitus with unspecified complications: Secondary | ICD-10-CM

## 2016-01-03 DIAGNOSIS — M4806 Spinal stenosis, lumbar region: Secondary | ICD-10-CM | POA: Diagnosis not present

## 2016-01-03 DIAGNOSIS — IMO0002 Reserved for concepts with insufficient information to code with codable children: Secondary | ICD-10-CM

## 2016-01-03 DIAGNOSIS — Z7189 Other specified counseling: Secondary | ICD-10-CM | POA: Diagnosis not present

## 2016-01-03 DIAGNOSIS — Z794 Long term (current) use of insulin: Secondary | ICD-10-CM

## 2016-01-03 DIAGNOSIS — E785 Hyperlipidemia, unspecified: Secondary | ICD-10-CM | POA: Diagnosis not present

## 2016-01-03 DIAGNOSIS — M48061 Spinal stenosis, lumbar region without neurogenic claudication: Secondary | ICD-10-CM

## 2016-01-03 DIAGNOSIS — E119 Type 2 diabetes mellitus without complications: Secondary | ICD-10-CM | POA: Diagnosis not present

## 2016-01-03 DIAGNOSIS — E1165 Type 2 diabetes mellitus with hyperglycemia: Secondary | ICD-10-CM

## 2016-01-03 LAB — COMPREHENSIVE METABOLIC PANEL
ALBUMIN: 4.1 g/dL (ref 3.5–5.2)
ALT: 14 U/L (ref 0–35)
AST: 25 U/L (ref 0–37)
Alkaline Phosphatase: 97 U/L (ref 39–117)
BUN: 26 mg/dL — AB (ref 6–23)
CHLORIDE: 100 meq/L (ref 96–112)
CO2: 28 mEq/L (ref 19–32)
CREATININE: 2.17 mg/dL — AB (ref 0.40–1.20)
Calcium: 9.3 mg/dL (ref 8.4–10.5)
GFR: 22.74 mL/min — ABNORMAL LOW (ref >60.00)
Glucose, Bld: 256 mg/dL — ABNORMAL HIGH (ref 70–99)
Potassium: 4.7 mEq/L (ref 3.5–5.1)
SODIUM: 138 meq/L (ref 135–145)
Total Bilirubin: 0.5 mg/dL (ref 0.2–1.2)
Total Protein: 6.7 g/dL (ref 6.0–8.3)

## 2016-01-03 LAB — MICROALBUMIN / CREATININE URINE RATIO
Creatinine,U: 53.9 mg/dL
Microalb Creat Ratio: 1.3 mg/g (ref 0.0–30.0)

## 2016-01-03 LAB — LDL CHOLESTEROL, DIRECT: Direct LDL: 87 mg/dL

## 2016-01-03 LAB — HEMOGLOBIN A1C: Hgb A1c MFr Bld: 7.4 % — ABNORMAL HIGH (ref 4.6–6.5)

## 2016-01-03 MED ORDER — HYDRALAZINE HCL 25 MG PO TABS: 25.0000 mg | ORAL_TABLET | Freq: Two times a day (BID) | ORAL | Status: DC

## 2016-01-03 MED ORDER — FUROSEMIDE 40 MG PO TABS
40.0000 mg | ORAL_TABLET | Freq: Two times a day (BID) | ORAL | Status: DC
Start: 2016-01-03 — End: 2016-03-15

## 2016-01-03 NOTE — Patient Instructions (Addendum)
Go to the lab on the way out.  We'll contact you with your lab report. Take care.  Glad to see you.  Let the pharmacy know when you need refills.  I can refill the meds that Dr. Hardin Negus used to write.  I'll check your old records in the meantime.

## 2016-01-03 NOTE — Progress Notes (Signed)
Pre visit review using our clinic review tool, if applicable. No additional management support is needed unless otherwise documented below in the visit note.  New patient.  I'll review her old records.    Spinal stenosis.  Not a candidate for surgery per patient report.   No acute changes in her pain.  Already on nsaid with prn tylenol use.  She didn't want other pain meds.  She hasn't been able to get repeat injections in her back after L leg numbness after prev injection.   Diabetes:  Using medications without difficulties:yes Hypoglycemic episodes:no Hyperglycemic episodes:no Feet problems: no  Blood Sugars averaging: usually ~100 or lower in the AM eye exam within last year: done 2016.   occ insulin dose in the PM, usually ~3x per week, if PM sugar >150.   Prev had PNA and flu shot done fall ~08/2015.   Husband then her daughter Adline Potter if patient were incapacitated.    PMH and SH reviewed  Meds, vitals, and allergies reviewed.   ROS: See HPI.  Otherwise negative.    GEN: nad, alert and oriented HEENT: mucous membranes moist NECK: supple w/o LA CV: rrr. PULM: ctab, no inc wob ABD: soft, +bs EXT: no edema SKIN: no acute rash  Diabetic foot exam: Normal inspection No skin breakdown No calluses  Normal DP pulses Normal sensation to light touch and monofilament Nails normal

## 2016-01-05 ENCOUNTER — Other Ambulatory Visit: Payer: Self-pay | Admitting: Family Medicine

## 2016-01-05 DIAGNOSIS — R7989 Other specified abnormal findings of blood chemistry: Secondary | ICD-10-CM

## 2016-01-05 DIAGNOSIS — Z7189 Other specified counseling: Secondary | ICD-10-CM | POA: Insufficient documentation

## 2016-01-05 DIAGNOSIS — M48061 Spinal stenosis, lumbar region without neurogenic claudication: Secondary | ICD-10-CM | POA: Insufficient documentation

## 2016-01-05 NOTE — Assessment & Plan Note (Signed)
See notes on labs.   >30 minutes spent in face to face time with patient, >50% spent in counselling or coordination of care.  I'll review her old records.

## 2016-01-05 NOTE — Assessment & Plan Note (Signed)
Not a candidate for surgery per patient report. No acute changes in her pain. Already on nsaid with prn tylenol use. She didn't want other pain meds. She hasn't been able to get repeat injections in her back after L leg numbness after prev injection.  No change in meds at this point.  D/w pt.  She agrees .

## 2016-01-07 ENCOUNTER — Encounter: Payer: Self-pay | Admitting: Family Medicine

## 2016-01-09 ENCOUNTER — Other Ambulatory Visit (INDEPENDENT_AMBULATORY_CARE_PROVIDER_SITE_OTHER): Payer: Medicare Other

## 2016-01-09 ENCOUNTER — Encounter: Payer: Self-pay | Admitting: Family Medicine

## 2016-01-09 DIAGNOSIS — R748 Abnormal levels of other serum enzymes: Secondary | ICD-10-CM | POA: Diagnosis not present

## 2016-01-09 DIAGNOSIS — N183 Chronic kidney disease, stage 3 unspecified: Secondary | ICD-10-CM | POA: Insufficient documentation

## 2016-01-09 DIAGNOSIS — R7989 Other specified abnormal findings of blood chemistry: Secondary | ICD-10-CM

## 2016-01-09 LAB — BASIC METABOLIC PANEL
BUN: 24 mg/dL — AB (ref 6–23)
CALCIUM: 9.3 mg/dL (ref 8.4–10.5)
CO2: 28 mEq/L (ref 19–32)
CREATININE: 1.57 mg/dL — AB (ref 0.40–1.20)
Chloride: 101 mEq/L (ref 96–112)
GFR: 33.03 mL/min — ABNORMAL LOW (ref >60.00)
Glucose, Bld: 119 mg/dL — ABNORMAL HIGH (ref 70–99)
Potassium: 4.7 mEq/L (ref 3.5–5.1)
Sodium: 139 mEq/L (ref 135–145)

## 2016-01-10 ENCOUNTER — Telehealth: Payer: Self-pay | Admitting: Family Medicine

## 2016-01-10 NOTE — Telephone Encounter (Signed)
Noted. Thanks.

## 2016-01-10 NOTE — Telephone Encounter (Signed)
Documented in EMR per information from patient.

## 2016-01-10 NOTE — Telephone Encounter (Signed)
Patient called to let Dr.Duncan know the tetanus shot she had was a T dap on 02/2015.

## 2016-01-11 ENCOUNTER — Ambulatory Visit (INDEPENDENT_AMBULATORY_CARE_PROVIDER_SITE_OTHER): Payer: Medicare Other | Admitting: Cardiovascular Disease

## 2016-01-11 ENCOUNTER — Ambulatory Visit: Payer: PRIVATE HEALTH INSURANCE | Admitting: Cardiovascular Disease

## 2016-01-11 ENCOUNTER — Encounter: Payer: Self-pay | Admitting: Cardiovascular Disease

## 2016-01-11 VITALS — BP 150/80 | HR 79 | Ht 63.0 in | Wt 127.0 lb

## 2016-01-11 DIAGNOSIS — I5032 Chronic diastolic (congestive) heart failure: Secondary | ICD-10-CM | POA: Diagnosis not present

## 2016-01-11 DIAGNOSIS — R0789 Other chest pain: Secondary | ICD-10-CM

## 2016-01-11 DIAGNOSIS — Z794 Long term (current) use of insulin: Secondary | ICD-10-CM

## 2016-01-11 DIAGNOSIS — IMO0002 Reserved for concepts with insufficient information to code with codable children: Secondary | ICD-10-CM

## 2016-01-11 DIAGNOSIS — E1165 Type 2 diabetes mellitus with hyperglycemia: Secondary | ICD-10-CM

## 2016-01-11 DIAGNOSIS — I10 Essential (primary) hypertension: Secondary | ICD-10-CM

## 2016-01-11 DIAGNOSIS — N183 Chronic kidney disease, stage 3 unspecified: Secondary | ICD-10-CM

## 2016-01-11 DIAGNOSIS — E118 Type 2 diabetes mellitus with unspecified complications: Secondary | ICD-10-CM

## 2016-01-11 DIAGNOSIS — I25718 Atherosclerosis of autologous vein coronary artery bypass graft(s) with other forms of angina pectoris: Secondary | ICD-10-CM

## 2016-01-11 DIAGNOSIS — R079 Chest pain, unspecified: Secondary | ICD-10-CM | POA: Diagnosis not present

## 2016-01-11 NOTE — Assessment & Plan Note (Signed)
Hemoglobin A1c greater than 7 Significant stress at home, she is not eating well

## 2016-01-11 NOTE — Progress Notes (Signed)
Patient ID: Donna Lane, female    DOB: 1925/11/05, 82 y.o.   MRN: [id number]  HPI Comments: Donna Lane is a pleasant 82 year old woman with history of coronary artery disease, catheterization August 25, 2007 showing severe three-vessel disease, with bypass surgery at [facility] September 2008 also with severe peripheral vascular disease, bilateral renal artery stenosis, carotid disease, iliac disease who presents  for follow-up of her coronary artery disease.  In follow-up, she reports that she has significant stress at home   she has been helping to take care of her husband who has had worsening dysphagia since carotid surgery October 2016 . She is giving food by a feeding tube . He's able to eat select food, thick liquids but not well . She is anxious, concerned for his health, does not have much time to herself   she denies any significant shortness of breath or chest pain on exertion, overall feels well Several times has woken up at night, possibly 6 times over the past several weeks with a "sensation" in her left chest. Does not last very long, less than a minute, then goes away. Denies any chest pain. She is unclear  If it is palpitations   reports taking Lasix daily, does not take the afternoon dose on a regular basis   arthritis pill was held for creatinine more than 2. She reports that she does not drink for a much  Also reports having spinal stenosis, seen by Donna Lane, surgery was not indicated, she had cortisone shots 2, second shot affected a nerve.  EKG on today's visit shows normal sinus rhythm with rate 74 bpm, APCs noted  Other past medical history reviewed  previous episodes of chest pain  resolved by decreasing the dose of her aspirin from 325 mg daily to 81 mg daily. She currently takes 81 mg 2 Prior pain in the subxiphoid region with occasional nausea and vomiting of green bilious liquid. She attributes her discomfort to taking meloxicam which was  initiated for possible gout.    hospital admission 04/17/2014  to 04/19/2014 with COPD exacerbation, acute on chronic diastolic CHF She was given IV Lasix with worsening renal dysfunction also given IV steroids, nebulizers with good improvement of her shortness of breath Baseline creatinine before diuresis was 1.25. After diuresis was 1.96  Carotid ultrasound in the November 2012 has shown 40-59% bilateral carotid disease.  LE u/s 11/2011 suggest  50% distal aorta disease, right common iliac artery with equal or less than 50% disease, left common iliac with equal or greater than 60% disease. no severe renal artery stenosis  Allergies  Allergen Reactions  . Nsaids Other (See Comments)    Would avoid due to CKD    Outpatient Encounter Prescriptions as of 01/11/2016  Medication Sig  . aspirin EC 81 MG tablet Take by mouth daily.   . furosemide (LASIX) 40 MG tablet Take 1 tablet (40 mg total) by mouth 2 (two) times daily.  Marland Kitchen glipiZIDE (GLUCOTROL XL) 5 MG 24 hr tablet Take 5 mg by mouth every morning.  . hydrALAZINE (APRESOLINE) 25 MG tablet Take 1 tablet (25 mg total) by mouth 2 (two) times daily.  . Insulin Detemir (LEVEMIR) 100 UNIT/ML Pen Inject 3-4 Units into the skin at bedtime as needed (for hyperglycemia).   . isosorbide mononitrate (IMDUR) 60 MG 24 hr tablet TAKE ONE-HALF TABLET BY MOUTH ONCE DAILY  . nitroGLYCERIN (NITROSTAT) 0.4 MG SL tablet Place 0.4 mg under the tongue every 5 (five) minutes  as needed for chest pain.   . potassium chloride SA (K-DUR,KLOR-CON) 20 MEQ tablet Take 20 mEq by mouth daily.  . simvastatin (ZOCOR) 20 MG tablet Take 1 tablet (20 mg total) by mouth daily.   No facility-administered encounter medications on file as of 01/11/2016.    Past Medical History  Diagnosis Date  . COPD (chronic obstructive pulmonary disease) ([place])   . PVD (peripheral vascular disease) ([place])   . Diabetes mellitus   . Hypertension   . Hyperlipidemia   . Coronary artery disease  2008    CABG x 4,   . Chronic diastolic heart failure (HCC)     with pulmonary HTN  . Chronic kidney disease   . Spinal stenosis   . Melanoma ([place])     removed by derm   . Diverticulosis     on colonoscopy 08/12/2006    Past Surgical History  Procedure Laterality Date  . Cardiac catheterization      PTCA x 3  . Coronary artery bypass graft  2008    x 4  . Tonsillectomy    . Cataract extraction    . Hip fracture surgery Left     pins placed w/o hip replacement, hardware removed later  . Vaginal delivery      x2  . Lumbar disc surgery      in her 109s    Social History  reports that she quit smoking about 8 years ago. Her smoking use included Cigarettes. She has a 50 pack-year smoking history. She has never used smokeless tobacco. She reports that she does not drink alcohol or use illicit drugs.  Family History family history includes Diabetes in her sister; Heart disease in her father and sister.  Review of Systems  Constitutional: Negative.   Respiratory: Negative.   Cardiovascular: Negative.   Gastrointestinal: Negative.   Musculoskeletal: Positive for back pain and gait problem.  Skin: Negative.   Allergic/Immunologic: Negative.   Neurological: Negative.   Hematological: Negative.   Psychiatric/Behavioral: Negative.   All other systems reviewed and are negative.  BP 150/80 mmHg  Pulse 79  Ht '5\' 3"'$  (1.6 m)  Wt 127 lb (57.607 kg)  BMI 22.50 kg/m2  Physical Exam  Constitutional: She is oriented to person, place, and time. She appears well-developed and well-nourished.  HENT:  Head: Normocephalic.  Nose: Nose normal.  Mouth/Throat: Oropharynx is clear and moist.  Eyes: Conjunctivae are normal. Pupils are equal, round, and reactive to light.  Neck: Normal range of motion. Neck supple. No JVD present.  Cardiovascular: Normal rate, regular rhythm, S1 normal, S2 normal and intact distal pulses.  Exam reveals no gallop and no friction rub.   Murmur heard.   Crescendo systolic murmur is present with a grade of 2/6  Pulmonary/Chest: Effort normal and breath sounds normal. No respiratory distress. She has no wheezes. She has no rales. She exhibits no tenderness.  Mildly decreased breath sounds bilaterally throughout.  Abdominal: Soft. Bowel sounds are normal. She exhibits no distension. There is no tenderness.  Musculoskeletal: Normal range of motion. She exhibits no edema or tenderness.  Lymphadenopathy:    She has no cervical adenopathy.  Neurological: She is alert and oriented to person, place, and time. Coordination normal.  Skin: Skin is warm and dry. No rash noted. No erythema.  Psychiatric: She has a normal mood and affect. Her behavior is normal. Judgment and thought content normal.    Assessment and Plan  Nursing note and vitals reviewed.

## 2016-01-11 NOTE — Assessment & Plan Note (Signed)
Appears relatively euvolemic on today's visit Likely prerenal when her creatinine climbs

## 2016-01-11 NOTE — Assessment & Plan Note (Signed)
Currently with no symptoms of angina. No further workup at this time. Continue current medication regimen.    Total encounter time more than 25 minutes  Greater than 50% was spent in counseling and coordination of care with the patient

## 2016-01-11 NOTE — Assessment & Plan Note (Signed)
Creatinine rate and 2, down to 1.5 Suggested she stop the afternoon Lasix, stay on one a day, try to increase her fluid intake slightly Extra Lasix after lunch for 3 pound weight gain

## 2016-01-11 NOTE — Assessment & Plan Note (Signed)
Blood pressure elevated on today's visit, she is very anxious, worried about her husband. No medication changes made

## 2016-01-11 NOTE — Patient Instructions (Signed)
You are doing well. No medication changes were made.  Please call us if you have new issues that need to be addressed before your next appt.  Your physician wants you to follow-up in: 6 months.  You will receive a reminder letter in the mail two months in advance. If you don't receive a letter, please call our office to schedule the follow-up appointment.   

## 2016-01-11 NOTE — Assessment & Plan Note (Signed)
No significant chest pain on today's visit. She does have a " sensation in her left chest" which wakes her in the night, lasts for less than 1 minute. No testing at this time Suspect secondary to anxiety, unable to exclude ectopy

## 2016-01-20 ENCOUNTER — Other Ambulatory Visit: Payer: Self-pay | Admitting: Cardiovascular Disease

## 2016-02-05 ENCOUNTER — Other Ambulatory Visit: Payer: Self-pay | Admitting: *Deleted

## 2016-02-05 ENCOUNTER — Encounter: Payer: Self-pay | Admitting: Family Medicine

## 2016-02-06 ENCOUNTER — Telehealth: Payer: Self-pay | Admitting: *Deleted

## 2016-02-06 NOTE — Telephone Encounter (Signed)
Faxed form received from [facility] for Diabetic Supplies.  Patient states she does use this company and signified which supplies she needs.  Form is in Dr. Josefine Class In [place].

## 2016-02-07 NOTE — Telephone Encounter (Signed)
I'll work on the hard copy.  Thanks.  

## 2016-02-15 ENCOUNTER — Telehealth: Payer: Self-pay | Admitting: Family Medicine

## 2016-02-15 NOTE — Telephone Encounter (Signed)
Patient advised. She will pick up the disk when she is at our office on 03/22/16.

## 2016-02-15 NOTE — Telephone Encounter (Addendum)
Notify pt.  This doesn't count as a formal read/opinion, but I finally got her images to review.  I couldn't use the disk but I got the images from the hospital system.   She does have significant degenerative changes in her lower back likely causing her pain.  At this point, if she can get by with her current meds, then I wouldn't change anything.  If the pain is getting out of hand then let me know.  If she wants the disk back, then please send to her.  O/w okay to shred the disk.  Thanks.

## 2016-02-29 ENCOUNTER — Other Ambulatory Visit: Payer: Self-pay | Admitting: Family Medicine

## 2016-02-29 MED ORDER — INSULIN DETEMIR 100 UNIT/ML FLEXPEN: 3.0000 [IU] | PEN_INJECTOR | Freq: Every evening | SUBCUTANEOUS | Status: DC | PRN

## 2016-03-01 ENCOUNTER — Ambulatory Visit (INDEPENDENT_AMBULATORY_CARE_PROVIDER_SITE_OTHER): Payer: Medicare Other | Admitting: Sports Medicine

## 2016-03-01 ENCOUNTER — Encounter: Payer: Self-pay | Admitting: Sports Medicine

## 2016-03-01 DIAGNOSIS — M79676 Pain in unspecified toe(s): Secondary | ICD-10-CM

## 2016-03-01 DIAGNOSIS — B351 Tinea unguium: Secondary | ICD-10-CM | POA: Diagnosis not present

## 2016-03-01 DIAGNOSIS — E119 Type 2 diabetes mellitus without complications: Secondary | ICD-10-CM

## 2016-03-01 DIAGNOSIS — Z794 Long term (current) use of insulin: Secondary | ICD-10-CM

## 2016-03-01 NOTE — Progress Notes (Signed)
Patient ID: Donna Lane, female   DOB: [birth year], 82 y.o.   MRN: [id number]  Subjective: Donna Lane is a 82 y.o. female patient with history of type 2 diabetes who presents to office today complaining of long, painful nails  while ambulating in shoes; unable to trim. Patient states that the glucose reading this morning was "good". Patient denies any new changes in medication or new problems. Patient denies any new cramping, numbness, burning or tingling in the legs.  Desires to be seen right away because she cares for her sick husband.   Patient Active Problem List   Diagnosis Date Noted  . Chronic kidney disease, stage 3 01/09/2016  . Advance care planning 01/05/2016  . Spinal stenosis of lumbar region 01/05/2016  . Chronic diastolic CHF (congestive heart failure) ([place]) 10/12/2013  . Diabetes mellitus type 2, uncontrolled, with complications ([place]) 48/18/5631  . Hyperlipidemia 03/14/2010  . Essential hypertension 03/14/2010  . CORONARY ATHEROSLERO AUTOL VEIN BYPASS GRAFT 03/14/2010  . Peripheral arterial disease ([place]) 03/14/2010  . CHEST PAIN-UNSPECIFIED 03/14/2010   Current Outpatient Prescriptions on File Prior to Visit  Medication Sig Dispense Refill  . aspirin EC 81 MG tablet Take by mouth daily.     . furosemide (LASIX) 40 MG tablet Take 1 tablet (40 mg total) by mouth 2 (two) times daily.    Marland Kitchen glipiZIDE (GLUCOTROL XL) 5 MG 24 hr tablet Take 5 mg by mouth every morning.    . hydrALAZINE (APRESOLINE) 25 MG tablet Take 1 tablet (25 mg total) by mouth 2 (two) times daily.    . Insulin Detemir (LEVEMIR) 100 UNIT/ML Pen Inject 3-5 Units into the skin at bedtime as needed (for hyperglycemia). 15 mL prn  . isosorbide mononitrate (IMDUR) 60 MG 24 hr tablet TAKE ONE-HALF TABLET BY MOUTH ONCE DAILY 30 tablet 6  . nitroGLYCERIN (NITROSTAT) 0.4 MG SL tablet Place 0.4 mg under the tongue every 5 (five) minutes as needed for chest pain.     . potassium chloride SA (K-DUR,KLOR-CON) 20  MEQ tablet Take 20 mEq by mouth daily.    . simvastatin (ZOCOR) 20 MG tablet TAKE ONE TABLET BY MOUTH ONCE DAILY 30 tablet 6   No current facility-administered medications on file prior to visit.   Allergies  Allergen Reactions  . Nsaids Other (See Comments)    Would avoid due to CKD    Objective: General: Patient is awake, alert, and oriented x 3 and in no acute distress.  Integument: Skin is warm, dry and supple bilateral. Nails are tender, long, thickened and  dystrophic with subungual debris, consistent with onychomycosis, 1-5 bilateral. No signs of infection. No open lesions or preulcerative lesions present bilateral. Remaining integument unremarkable.  Vasculature:  Dorsalis Pedis pulse 2/4 bilateral. Posterior Tibial pulse  1/4 bilateral.  Capillary fill time <3 sec 1-5 bilateral. Scant hair growth to the level of the digits. Temperature gradient within normal limits. Mild varicosities present bilateral. No edema present bilateral.   Neurology: The patient has intact sensation measured with a 5.07/10g Semmes Weinstein Monofilament at all pedal sites bilateral . Vibratory sensation diminished bilateral with tuning fork. No Babinski sign present bilateral.   Musculoskeletal: No gross pedal deformities noted bilateral. Muscular strength 5/5 in all lower extremity muscular groups bilateral without pain or limitation on range of motion . No tenderness with calf compression bilateral.  Assessment and Plan: Problem List Items Addressed This Visit    None    Visit Diagnoses    Pain due to  onychomycosis of toenail    -  Primary    Type 2 diabetes mellitus without complication, with long-term current use of insulin ([place])          -Examined patient. -Discussed and educated patient on diabetic foot care, especially with  regards to the vascular, neurological and musculoskeletal systems.  -Stressed the importance of good glycemic control and the detriment of not  controlling glucose  levels in relation to the foot. -Mechanically debrided all nails 1-5 bilateral using sterile nail nipper and filed with dremel without incident  -Answered all patient questions -Patient to return in 3 months for at risk foot care -Patient advised to call the office if any problems or questions arise in the meantime.  Landis Martins, DPM

## 2016-03-15 ENCOUNTER — Other Ambulatory Visit: Payer: Self-pay | Admitting: *Deleted

## 2016-03-15 NOTE — Telephone Encounter (Signed)
Pt recently established care and is requesting medication refill. Rx has never been written by Dr Damita Dunnings. pls advise

## 2016-03-17 MED ORDER — FUROSEMIDE 40 MG PO TABS: 40.0000 mg | ORAL_TABLET | Freq: Two times a day (BID) | ORAL | Status: DC

## 2016-03-17 NOTE — Telephone Encounter (Signed)
Sent. Thanks.   

## 2016-05-03 DIAGNOSIS — L720 Epidermal cyst: Secondary | ICD-10-CM | POA: Diagnosis not present

## 2016-05-03 DIAGNOSIS — Z85820 Personal history of malignant melanoma of skin: Secondary | ICD-10-CM | POA: Diagnosis not present

## 2016-05-03 DIAGNOSIS — I8312 Varicose veins of left lower extremity with inflammation: Secondary | ICD-10-CM | POA: Diagnosis not present

## 2016-05-03 DIAGNOSIS — D692 Other nonthrombocytopenic purpura: Secondary | ICD-10-CM | POA: Diagnosis not present

## 2016-05-03 DIAGNOSIS — Z1283 Encounter for screening for malignant neoplasm of skin: Secondary | ICD-10-CM | POA: Diagnosis not present

## 2016-05-03 DIAGNOSIS — Z85828 Personal history of other malignant neoplasm of skin: Secondary | ICD-10-CM | POA: Diagnosis not present

## 2016-05-03 DIAGNOSIS — L820 Inflamed seborrheic keratosis: Secondary | ICD-10-CM | POA: Diagnosis not present

## 2016-05-03 DIAGNOSIS — L578 Other skin changes due to chronic exposure to nonionizing radiation: Secondary | ICD-10-CM | POA: Diagnosis not present

## 2016-05-06 ENCOUNTER — Other Ambulatory Visit: Payer: Self-pay | Admitting: *Deleted

## 2016-05-06 MED ORDER — GLIPIZIDE ER 5 MG PO TB24
5.0000 mg | ORAL_TABLET | ORAL | Status: DC
Start: 2016-05-06 — End: 2016-11-05

## 2016-05-20 ENCOUNTER — Encounter: Payer: Self-pay | Admitting: Family Medicine

## 2016-05-20 ENCOUNTER — Ambulatory Visit (INDEPENDENT_AMBULATORY_CARE_PROVIDER_SITE_OTHER): Payer: Medicare Other | Admitting: Family Medicine

## 2016-05-20 VITALS — BP 142/60 | HR 83 | Temp 97.9°F | Wt 131.5 lb

## 2016-05-20 DIAGNOSIS — M791 Myalgia, unspecified site: Secondary | ICD-10-CM

## 2016-05-20 DIAGNOSIS — M609 Myositis, unspecified: Secondary | ICD-10-CM

## 2016-05-20 DIAGNOSIS — IMO0001 Reserved for inherently not codable concepts without codable children: Secondary | ICD-10-CM

## 2016-05-20 LAB — CBC WITH DIFFERENTIAL/PLATELET
BASOS PCT: 0.6 % (ref 0.0–3.0)
Basophils Absolute: 0 10*3/uL (ref 0.0–0.1)
EOS ABS: 0.3 10*3/uL (ref 0.0–0.7)
Eosinophils Relative: 4.4 % (ref 0.0–5.0)
HCT: 37.4 % (ref 36.0–46.0)
HEMOGLOBIN: 12.6 g/dL (ref 12.0–15.0)
LYMPHS ABS: 1.4 10*3/uL (ref 0.7–4.0)
Lymphocytes Relative: 18.6 % (ref 12.0–46.0)
MCHC: 33.7 g/dL (ref 30.0–36.0)
MCV: 90 fl (ref 78.0–100.0)
MONO ABS: 0.6 10*3/uL (ref 0.1–1.0)
Monocytes Relative: 7.9 % (ref 3.0–12.0)
NEUTROS PCT: 68.5 % (ref 43.0–77.0)
Neutro Abs: 5.3 10*3/uL (ref 1.4–7.7)
Platelets: 190 10*3/uL (ref 150.0–400.0)
RBC: 4.16 Mil/uL (ref 3.87–5.11)
RDW: 12.8 % (ref 11.5–15.5)
WBC: 7.7 10*3/uL (ref 4.0–10.5)

## 2016-05-20 LAB — SEDIMENTATION RATE: Sed Rate: 17 mm/hr (ref 0–30)

## 2016-05-20 LAB — COMPREHENSIVE METABOLIC PANEL
ALK PHOS: 97 U/L (ref 39–117)
ALT: 11 U/L (ref 0–35)
AST: 20 U/L (ref 0–37)
Albumin: 3.9 g/dL (ref 3.5–5.2)
BILIRUBIN TOTAL: 0.4 mg/dL (ref 0.2–1.2)
BUN: 27 mg/dL — AB (ref 6–23)
CO2: 27 mEq/L (ref 19–32)
CREATININE: 1.45 mg/dL — AB (ref 0.40–1.20)
Calcium: 8.9 mg/dL (ref 8.4–10.5)
Chloride: 101 mEq/L (ref 96–112)
GFR: 36.17 mL/min — ABNORMAL LOW (ref >60.00)
GLUCOSE: 262 mg/dL — AB (ref 70–99)
Potassium: 3.8 mEq/L (ref 3.5–5.1)
SODIUM: 135 meq/L (ref 135–145)
TOTAL PROTEIN: 6.3 g/dL (ref 6.0–8.3)

## 2016-05-20 LAB — CK: Total CK: 103 U/L (ref 7–177)

## 2016-05-20 MED ORDER — SIMVASTATIN 20 MG PO TABS: ORAL_TABLET | ORAL | Status: DC

## 2016-05-20 NOTE — Assessment & Plan Note (Signed)
Trunk and ext x4 w/o trauma or clear cause.  Still on statin.  Check labs today and hold statin, d/w pt.  Still okay for outpatient f/u.  We can w/u further if not better off statin.  D/w pt. She agrees.

## 2016-05-20 NOTE — Progress Notes (Signed)
Pre visit review using our clinic review tool, if applicable. No additional management support is needed unless otherwise documented below in the visit note.  Her back, shoulders and legs and knee are hurting.  Started about 3 days ago.  No fevers.  "hurting all over."  Her skin aches.  No falls.  No trauma.  No vomiting, no diarrhea.  No burning with urination.  No cough.  No sputum.  No weight loss.  Appetite is still normal.    Meds, vitals, and allergies reviewed.   ROS: Per HPI unless specifically indicated in ROS section   GEN: nad, alert and oriented HEENT: mucous membranes moist NECK: supple w/o LA CV: rrr PULM: ctab, no inc wob ABD: soft, +bs EXT: no edema SKIN: no acute rash Ext not ttp, not ttp on the large joints or muscle groups.

## 2016-05-20 NOTE — Patient Instructions (Signed)
Stop the simvastatin for now and go to the lab on the way out.  We'll contact you with your lab report. I think this is likely from the cholesterol medicine.  Take care.  Glad to see you.

## 2016-05-27 ENCOUNTER — Other Ambulatory Visit: Payer: Self-pay | Admitting: *Deleted

## 2016-05-27 MED ORDER — HYDRALAZINE HCL 25 MG PO TABS: 25.0000 mg | ORAL_TABLET | Freq: Two times a day (BID) | ORAL | Status: DC

## 2016-05-31 ENCOUNTER — Ambulatory Visit (INDEPENDENT_AMBULATORY_CARE_PROVIDER_SITE_OTHER): Payer: Medicare Other | Admitting: Sports Medicine

## 2016-05-31 ENCOUNTER — Encounter: Payer: Self-pay | Admitting: Sports Medicine

## 2016-05-31 DIAGNOSIS — Z794 Long term (current) use of insulin: Secondary | ICD-10-CM

## 2016-05-31 DIAGNOSIS — E119 Type 2 diabetes mellitus without complications: Secondary | ICD-10-CM

## 2016-05-31 DIAGNOSIS — M79676 Pain in unspecified toe(s): Secondary | ICD-10-CM

## 2016-05-31 DIAGNOSIS — B351 Tinea unguium: Secondary | ICD-10-CM | POA: Diagnosis not present

## 2016-05-31 NOTE — Progress Notes (Signed)
Patient ID: Donna Lane, female   DOB: 1925/03/08, 82 y.o.   MRN: [id number]  Subjective: Donna Lane is a 82 y.o. female patient with history of type 2 diabetes who presents to office today complaining of long, painful nails  while ambulating in shoes; unable to trim. Patient states that the glucose reading this morning not recorded; she has been busy taking care of her sick husband and has not been checking. Patient denies any new changes in medication or new problems. Patient denies any new cramping, numbness, burning or tingling in the legs. States that her nails grow slow and would like to be seen every 4 months instead of 3.   Patient Active Problem List   Diagnosis Date Noted  . Myalgia and myositis 05/20/2016  . Chronic kidney disease, stage 3 01/09/2016  . Advance care planning 01/05/2016  . Spinal stenosis of lumbar region 01/05/2016  . Chronic diastolic CHF (congestive heart failure) ([place]) 10/12/2013  . Diabetes mellitus type 2, uncontrolled, with complications ([place]) 78/29/5621  . Hyperlipidemia 03/14/2010  . Essential hypertension 03/14/2010  . CORONARY ATHEROSLERO AUTOL VEIN BYPASS GRAFT 03/14/2010  . Peripheral arterial disease ([place]) 03/14/2010  . CHEST PAIN-UNSPECIFIED 03/14/2010   Current Outpatient Prescriptions on File Prior to Visit  Medication Sig Dispense Refill  . acetaminophen (TYLENOL) 500 MG tablet Take 500 mg by mouth every morning.    Marland Kitchen aspirin EC 81 MG tablet Take by mouth daily.     . furosemide (LASIX) 40 MG tablet Take 1 tablet (40 mg total) by mouth 2 (two) times daily. 60 tablet 5  . glipiZIDE (GLUCOTROL XL) 5 MG 24 hr tablet Take 1 tablet (5 mg total) by mouth every morning. 30 tablet 5  . hydrALAZINE (APRESOLINE) 25 MG tablet Take 1 tablet (25 mg total) by mouth 2 (two) times daily. 60 tablet 5  . Insulin Detemir (LEVEMIR) 100 UNIT/ML Pen Inject 3-5 Units into the skin at bedtime as needed (for hyperglycemia). 15 mL prn  . isosorbide  mononitrate (IMDUR) 60 MG 24 hr tablet TAKE ONE-HALF TABLET BY MOUTH ONCE DAILY 30 tablet 6  . nitroGLYCERIN (NITROSTAT) 0.4 MG SL tablet Place 0.4 mg under the tongue every 5 (five) minutes as needed for chest pain.     . potassium chloride SA (K-DUR,KLOR-CON) 20 MEQ tablet Take 20 mEq by mouth daily.    . simvastatin (ZOCOR) 20 MG tablet Stopped as of 05/20/16.     No current facility-administered medications on file prior to visit.   Allergies  Allergen Reactions  . Nsaids Other (See Comments)    Would avoid due to CKD    Objective: General: Patient is awake, alert, and oriented x 3 and in no acute distress.  Integument: Skin is warm, dry and supple bilateral. Nails are tender, long, thickened and  dystrophic with subungual debris, consistent with onychomycosis, 1-5 bilateral. No signs of infection. No open lesions or preulcerative lesions present bilateral. Remaining integument unremarkable.  Vasculature:  Dorsalis Pedis pulse 2/4 bilateral. Posterior Tibial pulse  1/4 bilateral.  Capillary fill time <3 sec 1-5 bilateral. Scant hair growth to the level of the digits. Temperature gradient within normal limits. Mild varicosities present bilateral. No edema present bilateral.   Neurology: The patient has intact sensation measured with a 5.07/10g Semmes Weinstein Monofilament at all pedal sites bilateral . Vibratory sensation diminished bilateral with tuning fork. No Babinski sign present bilateral.   Musculoskeletal: No gross pedal deformities noted bilateral. Muscular strength 5/5 in all lower extremity  muscular groups bilateral without pain or limitation on range of motion . No tenderness with calf compression bilateral.  Assessment and Plan: Problem List Items Addressed This Visit    None    Visit Diagnoses    Pain due to onychomycosis of toenail    -  Primary    Type 2 diabetes mellitus without complication, with long-term current use of insulin ([place])          -Examined  patient. -Discussed and educated patient on diabetic foot care, especially with  regards to the vascular, neurological and musculoskeletal systems.  -Stressed the importance of good glycemic control and the detriment of not  controlling glucose levels in relation to the foot. -Mechanically debrided all nails 1-5 bilateral using sterile nail nipper and filed with dremel without incident  -Answered all patient questions -Patient to return in 4 months for at risk foot care -Patient advised to call the office if any problems or questions arise in the meantime.  Landis Martins, DPM

## 2016-06-04 ENCOUNTER — Ambulatory Visit: Payer: Medicare Other | Admitting: Sports Medicine

## 2016-06-19 ENCOUNTER — Ambulatory Visit (INDEPENDENT_AMBULATORY_CARE_PROVIDER_SITE_OTHER): Payer: Medicare Other | Admitting: Family Medicine

## 2016-06-19 ENCOUNTER — Encounter: Payer: Self-pay | Admitting: Family Medicine

## 2016-06-19 VITALS — BP 160/70 | HR 72 | Temp 97.9°F | Ht 63.0 in | Wt 127.8 lb

## 2016-06-19 DIAGNOSIS — M7021 Olecranon bursitis, right elbow: Secondary | ICD-10-CM | POA: Diagnosis not present

## 2016-06-19 NOTE — Assessment & Plan Note (Signed)
No sign of infection.  Small amount of fluid by exam.  Not worth draining, d/w pt.  See AVS.  F/u prn.  She agrees.

## 2016-06-19 NOTE — Progress Notes (Signed)
Pre visit review using our clinic review tool, if applicable. No additional management support is needed unless otherwise documented below in the visit note.  She got her crutch retipped and that has been helpful for walking.    She is still worried about her husband, with his feeding troubles and G tube.  D/w pt.   R elbow olecranon bursitis.  Recently noted, about 4 days ago.  Not ttp.  No trauma known.  Not bothersome for patient.    Meds, vitals, and allergies reviewed.   ROS: Per HPI unless specifically indicated in ROS section   nad ncat  Normal R elbow ROM but soft, puffy olecranon bursa noted, not ttp, not red.  Normal ROM distally.

## 2016-06-19 NOTE — Patient Instructions (Signed)
Olecranon bursitis.   Ice as needed.  Try not to bump it.     If really a lot bigger, then we can drain if needed.   I wouldn't do anything else unless it gets red/hot/painful.  Update [place] as needed.  Take care.  Glad to see you.

## 2016-06-24 DIAGNOSIS — L720 Epidermal cyst: Secondary | ICD-10-CM | POA: Diagnosis not present

## 2016-06-27 ENCOUNTER — Other Ambulatory Visit: Payer: Self-pay | Admitting: *Deleted

## 2016-06-27 NOTE — Telephone Encounter (Signed)
Faxed refill request.   The sig on patient's meds list says Take one tablet by mouth daily.  Sig on faxed refill request says Take two tablets by mouth daily.  Please advise.

## 2016-06-28 MED ORDER — POTASSIUM CHLORIDE CRYS ER 20 MEQ PO TBCR: 20.0000 meq | EXTENDED_RELEASE_TABLET | Freq: Every day | ORAL | Status: DC

## 2016-06-28 NOTE — Telephone Encounter (Signed)
I would have her continue as she has been taking it.   Please clarify with patient and then send in.  Her last K was normal.   If taking once day, then send in #90 and 3rf.  If taking two daily then send in #180 and 3rf.  Thanks.  Update me as needed.

## 2016-06-28 NOTE — Telephone Encounter (Signed)
Spoken to patient and confirm that she is taking 1 tablet daily.   Sent the refill as instructed.

## 2016-07-06 ENCOUNTER — Emergency Department
Admission: EM | Admit: 2016-07-06 | Discharge: 2016-07-06 | Disposition: A | Discharge status: Home / Self Care | Payer: Medicare Other | Attending: Emergency Medicine | Admitting: Emergency Medicine

## 2016-07-06 ENCOUNTER — Encounter: Payer: Self-pay | Admitting: Emergency Medicine

## 2016-07-06 DIAGNOSIS — S0502XA Injury of conjunctiva and corneal abrasion without foreign body, left eye, initial encounter: Secondary | ICD-10-CM

## 2016-07-06 DIAGNOSIS — J449 Chronic obstructive pulmonary disease, unspecified: Secondary | ICD-10-CM | POA: Insufficient documentation

## 2016-07-06 DIAGNOSIS — Z794 Long term (current) use of insulin: Secondary | ICD-10-CM | POA: Diagnosis not present

## 2016-07-06 DIAGNOSIS — N183 Chronic kidney disease, stage 3 (moderate): Secondary | ICD-10-CM | POA: Diagnosis not present

## 2016-07-06 DIAGNOSIS — I130 Hypertensive heart and chronic kidney disease with heart failure and stage 1 through stage 4 chronic kidney disease, or unspecified chronic kidney disease: Secondary | ICD-10-CM | POA: Insufficient documentation

## 2016-07-06 DIAGNOSIS — Y929 Unspecified place or not applicable: Secondary | ICD-10-CM | POA: Insufficient documentation

## 2016-07-06 DIAGNOSIS — H5712 Ocular pain, left eye: Secondary | ICD-10-CM | POA: Diagnosis present

## 2016-07-06 DIAGNOSIS — I25810 Atherosclerosis of coronary artery bypass graft(s) without angina pectoris: Secondary | ICD-10-CM | POA: Insufficient documentation

## 2016-07-06 DIAGNOSIS — Z79899 Other long term (current) drug therapy: Secondary | ICD-10-CM | POA: Insufficient documentation

## 2016-07-06 DIAGNOSIS — Z7984 Long term (current) use of oral hypoglycemic drugs: Secondary | ICD-10-CM | POA: Insufficient documentation

## 2016-07-06 DIAGNOSIS — Y999 Unspecified external cause status: Secondary | ICD-10-CM | POA: Diagnosis not present

## 2016-07-06 DIAGNOSIS — Z951 Presence of aortocoronary bypass graft: Secondary | ICD-10-CM | POA: Insufficient documentation

## 2016-07-06 DIAGNOSIS — Z85820 Personal history of malignant melanoma of skin: Secondary | ICD-10-CM | POA: Insufficient documentation

## 2016-07-06 DIAGNOSIS — Z87891 Personal history of nicotine dependence: Secondary | ICD-10-CM | POA: Diagnosis not present

## 2016-07-06 DIAGNOSIS — Y939 Activity, unspecified: Secondary | ICD-10-CM | POA: Diagnosis not present

## 2016-07-06 DIAGNOSIS — Z7982 Long term (current) use of aspirin: Secondary | ICD-10-CM | POA: Insufficient documentation

## 2016-07-06 DIAGNOSIS — I5032 Chronic diastolic (congestive) heart failure: Secondary | ICD-10-CM | POA: Insufficient documentation

## 2016-07-06 DIAGNOSIS — E1122 Type 2 diabetes mellitus with diabetic chronic kidney disease: Secondary | ICD-10-CM | POA: Insufficient documentation

## 2016-07-06 DIAGNOSIS — W228XXA Striking against or struck by other objects, initial encounter: Secondary | ICD-10-CM | POA: Insufficient documentation

## 2016-07-06 MED ORDER — EYE WASH OPHTH SOLN
OPHTHALMIC | Status: AC
  Administered 2016-07-06: 13:00:00
  Filled 2016-07-06: qty 118

## 2016-07-06 MED ORDER — OFLOXACIN 0.3 % OP SOLN: 2.0000 [drp] | Freq: Four times a day (QID) | OPHTHALMIC | Status: DC

## 2016-07-06 MED ORDER — FLUORESCEIN SODIUM 1 MG OP STRP
ORAL_STRIP | OPHTHALMIC | Status: AC
  Filled 2016-07-06: qty 1

## 2016-07-06 MED ORDER — TETRACAINE HCL 0.5 % OP SOLN
OPHTHALMIC | Status: AC
  Filled 2016-07-06: qty 2

## 2016-07-06 NOTE — ED Notes (Signed)
Was accidentally poked in left eye yesterday, c/o left eye pain and redness.

## 2016-07-06 NOTE — ED Provider Notes (Signed)
[facility] Emergency Department Provider Note  ____________________________________________  Time seen: Approximately 12:48 PM  I have reviewed the triage vital signs and the nursing notes.   HISTORY  Chief Complaint Eye Injury    HPI Donna Lane is a 82 y.o. female presents for evaluation of left eye pain and redness after being poked in her eye yesterday. Patient states no change in vision.. No relief with over-the-counter medications describes pain as 8/10.   Past Medical History  Diagnosis Date  . COPD (chronic obstructive pulmonary disease) ([place])   . PVD (peripheral vascular disease) ([place])   . Diabetes mellitus   . Hypertension   . Hyperlipidemia   . Coronary artery disease 2008    CABG x 4,   . Chronic diastolic heart failure ([facility])     with pulmonary HTN  . Chronic kidney disease   . Spinal stenosis   . Melanoma ([place])     removed by derm   . Diverticulosis     on colonoscopy 08/12/2006  . CHF (congestive heart failure) [facility])     Patient Active Problem List   Diagnosis Date Noted  . Olecranon bursitis of right elbow 06/19/2016  . Myalgia and myositis 05/20/2016  . Chronic kidney disease, stage 3 01/09/2016  . Advance care planning 01/05/2016  . Spinal stenosis of lumbar region 01/05/2016  . Chronic diastolic CHF (congestive heart failure) ([place]) 10/12/2013  . Diabetes mellitus type 2, uncontrolled, with complications ([place]) 11/91/4782  . Hyperlipidemia 03/14/2010  . Essential hypertension 03/14/2010  . CORONARY ATHEROSLERO AUTOL VEIN BYPASS GRAFT 03/14/2010  . Peripheral arterial disease ([place]) 03/14/2010  . CHEST PAIN-UNSPECIFIED 03/14/2010    Past Surgical History  Procedure Laterality Date  . Cardiac catheterization      PTCA x 3  . Coronary artery bypass graft  2008    x 4  . Tonsillectomy    . Cataract extraction    . Hip fracture surgery Left     pins placed w/o hip replacement, hardware removed later  . Vaginal  delivery      x2  . Lumbar disc surgery      in her 81s    Current Outpatient Rx  Name  Route  Sig  Dispense  Refill  . acetaminophen (TYLENOL) 500 MG tablet   Oral   Take 500 mg by mouth 2 (two) times daily.          Marland Kitchen aspirin EC 81 MG tablet   Oral   Take by mouth daily.          . furosemide (LASIX) 40 MG tablet   Oral   Take 1 tablet (40 mg total) by mouth 2 (two) times daily.   60 tablet   5   . glipiZIDE (GLUCOTROL XL) 5 MG 24 hr tablet   Oral   Take 1 tablet (5 mg total) by mouth every morning.   30 tablet   5   . hydrALAZINE (APRESOLINE) 25 MG tablet   Oral   Take 1 tablet (25 mg total) by mouth 2 (two) times daily.   60 tablet   5   . Insulin Detemir (LEVEMIR) 100 UNIT/ML Pen   Subcutaneous   Inject 3-5 Units into the skin at bedtime as needed (for hyperglycemia).   15 mL   prn   . isosorbide mononitrate (IMDUR) 60 MG 24 hr tablet      TAKE ONE-HALF TABLET BY MOUTH ONCE DAILY   30 tablet   6   .  nitroGLYCERIN (NITROSTAT) 0.4 MG SL tablet   Sublingual   Place 0.4 mg under the tongue every 5 (five) minutes as needed for chest pain.          Marland Kitchen ofloxacin (OCUFLOX) 0.3 % ophthalmic solution   Left Eye   Place 2 drops into the left eye 4 (four) times daily.   5 mL   0   . potassium chloride SA (K-DUR,KLOR-CON) 20 MEQ tablet   Oral   Take 1 tablet (20 mEq total) by mouth daily.   90 tablet   3   . simvastatin (ZOCOR) 20 MG tablet      Stopped as of 05/20/16.           Allergies Nsaids  Family History  Problem Relation Age of Onset  . Heart disease Father   . Heart disease Sister   . Diabetes Sister     Social History Social History  Substance Use Topics  . Smoking status: Former Smoker -- 1.00 packs/day for 50 years    Types: Cigarettes    Quit date: 03/20/2007  . Smokeless tobacco: Never Used  . Alcohol Use: No    Review of Systems Constitutional: No fever/chills Eyes: Positive left eye redness and pain. ENT: No sore  throat. Cardiovascular: Denies chest pain. Respiratory: Denies shortness of breath. Gastrointestinal: No abdominal pain.  No nausea, no vomiting.  No diarrhea.  No constipation. Genitourinary: Negative for dysuria. Musculoskeletal: Negative for back pain. Skin: Negative for rash. Neurological: Negative for headaches, focal weakness or numbness.  10-point ROS otherwise negative.  ____________________________________________   PHYSICAL EXAM:  VITAL SIGNS: ED Triage Vitals  Enc Vitals Group     BP 07/06/16 1218 155/64 mmHg     Pulse Rate 07/06/16 1218 97     Resp 07/06/16 1218 16     Temp 07/06/16 1218 97.7 F (36.5 C)     Temp Source 07/06/16 1218 Oral     SpO2 07/06/16 1218 98 %     Weight 07/06/16 1218 123 lb (55.792 kg)     Height 07/06/16 1218 '5\' 2"'$  (1.575 m)     Head Cir --      Peak Flow --      Pain Score 07/06/16 1219 8     Pain Loc --      Pain Edu? --      Excl. in [place]? --     Constitutional: Alert and oriented. Well appearing and in no acute distress. Eyes: Conjunctivae are normal. PERRL. EOMI. Head: Atraumatic. Nose: No congestion/rhinnorhea. Mouth/Throat: Mucous membranes are moist.  Oropharynx non-erythematous. Neck: No stridor.   Cardiovascular: Normal rate, regular rhythm. Grossly normal heart sounds.  Good peripheral circulation. Respiratory: Normal respiratory effort.  No retractions. Lungs CTAB. Gastrointestinal: Soft and nontender. No distention. No abdominal bruits. No CVA tenderness. Musculoskeletal: No lower extremity tenderness nor edema.  No joint effusions. Neurologic:  Normal speech and language. No gross focal neurologic deficits are appreciated. No gait instability. Skin:  Skin is warm, dry and intact. No rash noted. Psychiatric: Mood and affect are normal. Speech and behavior are normal.  ____________________________________________   LABS (all labs ordered are listed, but only abnormal results are displayed)  Labs Reviewed - No data  to display ____________________________________________  EKG   ____________________________________________  RADIOLOGY   ____________________________________________   PROCEDURES  Procedure(s) performed: yes Tetracaine drops x 2 placed. Fluorescein placed. Visualized cornea with [place] lamp. Positive corneal abrasion noted at approximately 6:00 just inferior to the pupil.  Critical Care performed: No  ____________________________________________   INITIAL IMPRESSION / ASSESSMENT AND PLAN / ED COURSE  Pertinent labs & imaging results that were available during my care of the patient were reviewed by me and considered in my medical decision making (see chart for details).  Positive corneal abrasion left eye. Rx given for Ocuflox drops 2 drops 4 times a day for 5 days. Patient follow-up with PCP or return ER with any worsening symptomology. Patient voices no other emergency medical complaints at this time. ____________________________________________   FINAL CLINICAL IMPRESSION(S) / ED DIAGNOSES  Final diagnoses:  Corneal abrasion, left, initial encounter     This chart was dictated using voice recognition software/Dragon. Despite best efforts to proofread, errors can occur which can change the meaning. Any change was purely unintentional.   Arlyss Repress, PA-C 07/06/16 [place], MD 07/06/16 [phone]

## 2016-07-06 NOTE — Discharge Instructions (Signed)

## 2016-07-06 NOTE — ED Notes (Signed)
See triage note  States a friend accidentally stuck her finger in her left eye  Having increased pain with some redness

## 2016-07-11 ENCOUNTER — Ambulatory Visit (INDEPENDENT_AMBULATORY_CARE_PROVIDER_SITE_OTHER): Payer: Medicare Other | Admitting: Cardiovascular Disease

## 2016-07-11 ENCOUNTER — Encounter: Payer: Self-pay | Admitting: Cardiovascular Disease

## 2016-07-11 VITALS — BP 130/60 | HR 74 | Ht 63.0 in | Wt 127.0 lb

## 2016-07-11 DIAGNOSIS — IMO0002 Reserved for concepts with insufficient information to code with codable children: Secondary | ICD-10-CM

## 2016-07-11 DIAGNOSIS — E118 Type 2 diabetes mellitus with unspecified complications: Secondary | ICD-10-CM

## 2016-07-11 DIAGNOSIS — I5032 Chronic diastolic (congestive) heart failure: Secondary | ICD-10-CM

## 2016-07-11 DIAGNOSIS — E785 Hyperlipidemia, unspecified: Secondary | ICD-10-CM | POA: Diagnosis not present

## 2016-07-11 DIAGNOSIS — I25718 Atherosclerosis of autologous vein coronary artery bypass graft(s) with other forms of angina pectoris: Secondary | ICD-10-CM

## 2016-07-11 DIAGNOSIS — I10 Essential (primary) hypertension: Secondary | ICD-10-CM

## 2016-07-11 DIAGNOSIS — Z794 Long term (current) use of insulin: Secondary | ICD-10-CM

## 2016-07-11 DIAGNOSIS — I739 Peripheral vascular disease, unspecified: Secondary | ICD-10-CM

## 2016-07-11 DIAGNOSIS — E1165 Type 2 diabetes mellitus with hyperglycemia: Secondary | ICD-10-CM

## 2016-07-11 NOTE — Patient Instructions (Addendum)
Medication Instructions:   Please restart the simvastatin (for cholesterol)  Consider taking vitamin D for bones  Labwork:  No new labs  Testing/Procedures:  No new testing   Follow-Up: It was a pleasure seeing you in the office today. Please call us if you have new issues that need to be addressed before your next appt.  [phone]  Your physician wants you to follow-up in: 6 months.  You will receive a reminder letter in the mail two months in advance. If you don't receive a letter, please call our office to schedule the follow-up appointment.  If you need a refill on your cardiac medications before your next appointment, please call your pharmacy.

## 2016-07-11 NOTE — Progress Notes (Signed)
Cardiology Office Note  Date:  07/11/2016   ID:  Donna Lane, DOB [birth year], MRN [id number]  PCP:  [name], MD   Chief Complaint  Patient presents with  . Other    6 month f/u c/o flutter at bedtime. Meds reviewed verbally with pt.    HPI:  Donna Lane is a pleasant 82 year old woman with history of coronary artery disease, catheterization August 25, 2007 showing severe three-vessel disease, with bypass surgery at [facility] September 2008 also with severe peripheral vascular disease, bilateral renal artery stenosis, carotid disease, iliac disease who presents  for follow-up of her coronary artery disease.  In general she reports she is doing well in follow-up today Continued stress at home taking care of her husband who has persistent dysphagia since carotid surgery October 2016 . She is giving food by a feeding tube . He's able to eat select food, thick liquids , not enough calories per day without feeding tube She is anxious in general No regular exercise program No shortness of breath or chest pain on exertion  Still with fluttering at night, she feels this could be secondary to stress Sleeps in recliner, Does not want a pill such as a beta blocker at nighttime  She had similar symptoms on her last clinic visit as detailed below  EKG on today's visit shows normal sinus rhythm with rate 74 bpm, no significant ST or T-wave changes  Other past medical history Several times has woken up at night, possibly 6 times over the past several weeks with a "sensation" in her left chest. Does not last very long, less than a minute, then goes away. Denies any chest pain. She is unclear  If it is palpitations   arthritis pill was held for creatinine more than 2.  Also reports having spinal stenosis, seen by Dr. Trenton Gammon, surgery was not indicated, she had cortisone shots 2, second shot affected a nerve.  previous episodes of chest pain  resolved by decreasing the  dose of her aspirin from 325 mg daily to 81 mg daily. She currently takes 81 mg 2 Prior pain in the subxiphoid region with occasional nausea and vomiting of green bilious liquid. She attributes her discomfort to taking meloxicam which was initiated for possible gout.    hospital admission 04/17/2014  to 04/19/2014 with COPD exacerbation, acute on chronic diastolic CHF She was given IV Lasix with worsening renal dysfunction also given IV steroids, nebulizers with good improvement of her shortness of breath Baseline creatinine before diuresis was 1.25. After diuresis was 1.96  Carotid ultrasound in the November 2012 has shown 40-59% bilateral carotid disease.  LE u/s 11/2011 suggest  50% distal aorta disease, right common iliac artery with equal or less than 50% disease, left common iliac with equal or greater than 60% disease. no severe renal artery stenosis  PMH:   has a past medical history of CHF (congestive heart failure) ([place]); Chronic diastolic heart failure ([place]); Chronic kidney disease; COPD (chronic obstructive pulmonary disease) ([place]); Coronary artery disease (2008); Diabetes mellitus; Diverticulosis; Hyperlipidemia; Hypertension; Melanoma ([place]); PVD (peripheral vascular disease) ([place]); and Spinal stenosis.  PSH:    Past Surgical History:  Procedure Laterality Date  . CARDIAC CATHETERIZATION     PTCA x 3  . CATARACT EXTRACTION    . CORONARY ARTERY BYPASS GRAFT  2008   x 4  . HIP FRACTURE SURGERY Left    pins placed w/o hip replacement, hardware removed later  . LUMBAR DISC SURGERY  in her 32s  . tonsillectomy    . VAGINAL DELIVERY     x2    Current Outpatient Prescriptions  Medication Sig Dispense Refill  . acetaminophen (TYLENOL) 500 MG tablet Take 500 mg by mouth 2 (two) times daily.     Marland Kitchen aspirin EC 81 MG tablet Take by mouth daily.     . furosemide (LASIX) 40 MG tablet Take 1 tablet (40 mg total) by mouth 2 (two) times daily. 60 tablet 5  . glipiZIDE (GLUCOTROL  XL) 5 MG 24 hr tablet Take 1 tablet (5 mg total) by mouth every morning. 30 tablet 5  . hydrALAZINE (APRESOLINE) 25 MG tablet Take 1 tablet (25 mg total) by mouth 2 (two) times daily. 60 tablet 5  . Insulin Detemir (LEVEMIR) 100 UNIT/ML Pen Inject 3-5 Units into the skin at bedtime as needed (for hyperglycemia). 15 mL prn  . isosorbide mononitrate (IMDUR) 60 MG 24 hr tablet TAKE ONE-HALF TABLET BY MOUTH ONCE DAILY 30 tablet 6  . nitroGLYCERIN (NITROSTAT) 0.4 MG SL tablet Place 0.4 mg under the tongue every 5 (five) minutes as needed for chest pain.     Marland Kitchen ofloxacin (OCUFLOX) 0.3 % ophthalmic solution Place 2 drops into the left eye 4 (four) times daily. (Patient taking differently: Place 2 drops into the left eye 3 (three) times daily. ) 5 mL 0  . potassium chloride SA (K-DUR,KLOR-CON) 20 MEQ tablet Take 1 tablet (20 mEq total) by mouth daily. 90 tablet 3  . simvastatin (ZOCOR) 20 MG tablet Stopped as of 05/20/16.     No current facility-administered medications for this visit.      Allergies:   Nsaids   Social History:  The patient  reports that she quit smoking about 9 years ago. Her smoking use included Cigarettes. She has a 50.00 pack-year smoking history. She has never used smokeless tobacco. She reports that she does not drink alcohol or use drugs.   Family History:   family history includes Diabetes in her sister; Heart disease in her father and sister.    Review of Systems: Review of Systems  Constitutional: Negative.   Respiratory: Negative.   Cardiovascular: Positive for palpitations.  Gastrointestinal: Negative.   Musculoskeletal: Positive for back pain and joint pain.  Neurological: Negative.   Psychiatric/Behavioral: Negative.   All other systems reviewed and are negative.    PHYSICAL EXAM: VS:  BP 130/60 (BP Location: Left Arm, Patient Position: Sitting, Cuff Size: Normal)   Pulse 74   Ht '5\' 3"'$  (1.6 m)   Wt 127 lb (57.6 kg)   BMI 22.50 kg/m  , BMI Body mass index is  22.5 kg/m. GEN: Well nourished, well developed, in no acute distress  HEENT: normal  Neck: no JVD, carotid bruits, or masses Cardiac: RRR; no murmurs, rubs, or gallops,no edema  Respiratory:  clear to auscultation bilaterally, normal work of breathing GI: soft, nontender, nondistended, + BS MS: no deformity or atrophy  Skin: warm and dry, no rash Neuro:  Strength and sensation are intact Psych: euthymic mood, full affect    Recent Labs: 05/20/2016: ALT 11; BUN 27; Creatinine, Ser 1.45; Hemoglobin 12.6; Platelets 190.0; Potassium 3.8; Sodium 135    Lipid Panel Lab Results  Component Value Date   CHOL 124 06/28/2012   HDL 41 06/28/2012   LDLCALC 56 06/28/2012   TRIG 136 06/28/2012      Wt Readings from Last 3 Encounters:  07/11/16 127 lb (57.6 kg)  07/06/16 123 lb (55.8 kg)  06/19/16 127 lb 12 oz (57.9 kg)       ASSESSMENT AND PLAN:  Chronic diastolic CHF (congestive heart failure) ([place]) - Plan: EKG 12-Lead Appears relatively euvolemic on today's visit Continues to take Lasix daily, with potassium No changes to her medication  Essential hypertension - Plan: EKG 12-Lead Blood pressure is well controlled on today's visit. No changes made to the medications.  Atherosclerosis of autologous vein coronary artery bypass graft with other forms of angina pectoris ([place]) Currently with no symptoms of angina. No further workup at this time. Continue current medication regimen.  Hyperlipidemia Encouraged her to stay on her simvastatin She had stopped the simvastatin secondary to possible knee pain. No improvement in her knee symptoms.  Peripheral arterial disease (HCC) bilateral renal artery stenosis, carotid disease, iliac disease Stressed importance of staying on her simvastatin  Uncontrolled type 2 diabetes mellitus with complication, with long-term current use of insulin ([place]) Long discussion concerning her diabetes. Hemoglobin A1c 7.4. May need high-dose insulin     Total encounter time more than 25 minutes  Greater than 50% was spent in counseling and coordination of care with the patient   Disposition:   F/U  6 months   Orders Placed This Encounter  Procedures  . EKG 12-Lead     Signed, Esmond Plants, M.D., Ph.D. 07/11/2016  [place], [place]

## 2016-07-12 ENCOUNTER — Ambulatory Visit: Payer: PRIVATE HEALTH INSURANCE | Admitting: Family Medicine

## 2016-07-12 ENCOUNTER — Ambulatory Visit (INDEPENDENT_AMBULATORY_CARE_PROVIDER_SITE_OTHER): Payer: Medicare Other | Admitting: Family Medicine

## 2016-07-12 ENCOUNTER — Encounter: Payer: Self-pay | Admitting: Family Medicine

## 2016-07-12 VITALS — BP 142/58 | HR 85 | Temp 97.9°F | Wt 129.5 lb

## 2016-07-12 DIAGNOSIS — Z636 Dependent relative needing care at home: Secondary | ICD-10-CM | POA: Diagnosis not present

## 2016-07-12 DIAGNOSIS — E785 Hyperlipidemia, unspecified: Secondary | ICD-10-CM

## 2016-07-12 DIAGNOSIS — Z794 Long term (current) use of insulin: Secondary | ICD-10-CM | POA: Diagnosis not present

## 2016-07-12 DIAGNOSIS — IMO0002 Reserved for concepts with insufficient information to code with codable children: Secondary | ICD-10-CM

## 2016-07-12 DIAGNOSIS — H579 Unspecified disorder of eye and adnexa: Secondary | ICD-10-CM

## 2016-07-12 DIAGNOSIS — E118 Type 2 diabetes mellitus with unspecified complications: Secondary | ICD-10-CM | POA: Diagnosis not present

## 2016-07-12 DIAGNOSIS — E1165 Type 2 diabetes mellitus with hyperglycemia: Secondary | ICD-10-CM

## 2016-07-12 DIAGNOSIS — E119 Type 2 diabetes mellitus without complications: Secondary | ICD-10-CM

## 2016-07-12 LAB — HEMOGLOBIN A1C: HEMOGLOBIN A1C: 7.8 % — AB (ref 4.6–6.5)

## 2016-07-12 NOTE — Progress Notes (Addendum)
Pre visit review using our clinic review tool, if applicable. No additional management support is needed unless otherwise documented below in the visit note. 

## 2016-07-12 NOTE — Patient Instructions (Addendum)
I would get back on the simvastatin.   Go to the lab on the way out.  We'll contact you with your lab report. Take care.  Glad to see you.  Update me as needed.

## 2016-07-14 DIAGNOSIS — Z636 Dependent relative needing care at home: Secondary | ICD-10-CM | POA: Insufficient documentation

## 2016-07-14 DIAGNOSIS — H579 Unspecified disorder of eye and adnexa: Secondary | ICD-10-CM | POA: Insufficient documentation

## 2016-07-14 NOTE — Assessment & Plan Note (Signed)
D/w pt about restart simvastatin.

## 2016-07-14 NOTE — Assessment & Plan Note (Signed)
Resolved, she can f/u with eye clinic re: corrective lenses.  She agrees.

## 2016-07-14 NOTE — Assessment & Plan Note (Signed)
I offered my support and she'll update me as needed. >25 minutes spent in face to face time with patient, >50% spent in counselling or coordination of care.

## 2016-07-14 NOTE — Assessment & Plan Note (Signed)
See notes on labs.  Would have goal A1c <8 given her age.

## 2016-07-15 NOTE — Progress Notes (Signed)
She is concerned about her husband with his dysphagia.  She doesn't know if he'll ever get enough swallowing function back to get his PEG tube out.  That is likely the biggest stressor in her life.  I offered my support at the [facility].   She saw cards yesterday and declined BB re: heart fluttering.    DM2.  Due for A1c.  Sugar hasn't been low.  Complaint with meds.  Only using insulin if sugar >150.  Usually taking a dose of insulin about 3x/week.   Statin use.  D/w pt.  She had stopped the simvastatin secondary to possible knee pain. No improvement in her knee symptoms. D/w pt about trying restart of med.   Eye sx resolved in the meantime.  No vision loss with the injury.  No eye pain.  No drainage.  Used drops and done with them in the meantime.    Meds, vitals, and allergies reviewed.   ROS: Per HPI unless specifically indicated in ROS section   GEN: nad, alert and oriented HEENT: mucous membranes moist NECK: supple w/o LA CV: rrr.  PULM: ctab, no inc wob ABD: soft, +bs EXT: no edema SKIN: no acute rash  L eye 20/30.  R eye 20/50 (recent L eye concern, but this is the "better" eye on exam today, d/w pt)

## 2016-09-10 DIAGNOSIS — Z23 Encounter for immunization: Secondary | ICD-10-CM | POA: Diagnosis not present

## 2016-09-18 ENCOUNTER — Other Ambulatory Visit: Payer: Self-pay | Admitting: Family Medicine

## 2016-10-04 ENCOUNTER — Ambulatory Visit (INDEPENDENT_AMBULATORY_CARE_PROVIDER_SITE_OTHER): Payer: Medicare Other | Admitting: Podiatry

## 2016-10-04 DIAGNOSIS — M79676 Pain in unspecified toe(s): Secondary | ICD-10-CM | POA: Diagnosis not present

## 2016-10-04 DIAGNOSIS — B351 Tinea unguium: Secondary | ICD-10-CM

## 2016-10-04 DIAGNOSIS — M79609 Pain in unspecified limb: Principal | ICD-10-CM

## 2016-10-04 DIAGNOSIS — L608 Other nail disorders: Secondary | ICD-10-CM

## 2016-10-04 DIAGNOSIS — E0843 Diabetes mellitus due to underlying condition with diabetic autonomic (poly)neuropathy: Secondary | ICD-10-CM

## 2016-10-04 DIAGNOSIS — L603 Nail dystrophy: Secondary | ICD-10-CM

## 2016-10-04 NOTE — Progress Notes (Signed)
SUBJECTIVE Patient with a history of diabetes mellitus presents to office today complaining of elongated, thickened nails. Pain while ambulating in shoes. Patient is unable to trim their own nails.   Allergies  Allergen Reactions  . Nsaids Other (See Comments)    Would avoid due to CKD    OBJECTIVE General Patient is awake, alert, and oriented x 3 and in no acute distress. Derm Skin is dry and supple bilateral. Negative open lesions or macerations. Remaining integument unremarkable. Nails are tender, long, thickened and dystrophic with subungual debris, consistent with onychomycosis, 1-5 bilateral. No signs of infection noted. Vasc  DP and PT pedal pulses palpable bilaterally. Temperature gradient within normal limits.  Neuro Epicritic and protective threshold sensation diminished bilaterally.  Musculoskeletal Exam No symptomatic pedal deformities noted bilateral. Muscular strength within normal limits.  ASSESSMENT 1. Diabetes Mellitus w/ peripheral neuropathy 2. Onychomycosis of nail due to dermatophyte bilateral 3. Pain in foot bilateral  PLAN OF CARE 1. Patient evaluated today. 2. Instructed to maintain good pedal hygiene and foot care. Stressed importance of controlling blood sugar.  3. Mechanical debridement of nails 1-5 bilaterally performed using a nail nipper. Filed with dremel without incident.  4. Return to clinic in 3 mos.    Edrick Kins, DPM

## 2016-10-07 ENCOUNTER — Other Ambulatory Visit: Payer: Self-pay | Admitting: Cardiovascular Disease

## 2016-10-16 ENCOUNTER — Encounter: Payer: Self-pay | Admitting: Family Medicine

## 2016-10-16 ENCOUNTER — Ambulatory Visit (INDEPENDENT_AMBULATORY_CARE_PROVIDER_SITE_OTHER): Payer: Medicare Other | Admitting: Family Medicine

## 2016-10-16 DIAGNOSIS — M7021 Olecranon bursitis, right elbow: Secondary | ICD-10-CM

## 2016-10-16 DIAGNOSIS — I25718 Atherosclerosis of autologous vein coronary artery bypass graft(s) with other forms of angina pectoris: Secondary | ICD-10-CM

## 2016-10-16 NOTE — Patient Instructions (Addendum)
Likely olecranon bursitis.  Ice it as needed.  Update me as needed.  It not getting any better, then I can try to drain it later on.  Take care.  Glad to see you.  Plan on getting labs done in December and coming in for a visit a few days after that.

## 2016-10-16 NOTE — Progress Notes (Signed)
She has been caring for her husband who had ER visit for epistaxis yesterday.  D/w pt.  Her birthday was yesterday.    Still on baseline meds.    R elbow issue.  Olecranon bursitis, present for >1 month.  Now with an adjacent puffiness recently noted, distal to the bursa, still on the extensor side.  Overall the main puffy area is getting some bigger.  No FCNAVD.  Area isn't tender unless she bumps it.  No pain with ROM as long as she doesn't bump the area.    Meds, vitals, and allergies reviewed.   ROS: Per HPI unless specifically indicated in ROS section   Normal ROM.   R elbow with normal ROM and not ttp but swelling at the olecranon w/o redness or bruising.  Some distal extensor puffiness, mobile but appears connected to the bursa swelling. Distally NV intact.

## 2016-10-16 NOTE — Progress Notes (Signed)
Pre visit review using our clinic review tool, if applicable. No additional management support is needed unless otherwise documented below in the visit note. 

## 2016-10-17 NOTE — Assessment & Plan Note (Signed)
Likely partial rupture of the bursa with some distal drainage.  D/w pt.  Not ttp now.  Doesn't appear infected.  Overall, the affected area is still small.  D/w pt about pros and cons of drainage.  At this point, risk may be > benefit with possible re-accumulation of fluid and possible infection risk with drainage.  She agrees.  Ice as needed, update me as needed.  If continues to enlarge and if painful enough for benefit to outweigh risk with drainage, then I can try to drain.  D/w pt.  Doesn't appear infected.  Okay for outpatient f/u.  All questions answered.

## 2016-10-22 ENCOUNTER — Other Ambulatory Visit: Payer: Self-pay | Admitting: Cardiovascular Disease

## 2016-11-05 ENCOUNTER — Other Ambulatory Visit: Payer: Self-pay | Admitting: Family Medicine

## 2016-11-10 ENCOUNTER — Other Ambulatory Visit: Payer: Self-pay | Admitting: Family Medicine

## 2016-11-10 DIAGNOSIS — E1165 Type 2 diabetes mellitus with hyperglycemia: Secondary | ICD-10-CM

## 2016-11-10 DIAGNOSIS — IMO0002 Reserved for concepts with insufficient information to code with codable children: Secondary | ICD-10-CM

## 2016-11-10 DIAGNOSIS — E118 Type 2 diabetes mellitus with unspecified complications: Secondary | ICD-10-CM

## 2016-11-10 DIAGNOSIS — Z794 Long term (current) use of insulin: Principal | ICD-10-CM

## 2016-11-12 ENCOUNTER — Other Ambulatory Visit (INDEPENDENT_AMBULATORY_CARE_PROVIDER_SITE_OTHER): Payer: Medicare Other

## 2016-11-12 DIAGNOSIS — E1165 Type 2 diabetes mellitus with hyperglycemia: Secondary | ICD-10-CM | POA: Diagnosis not present

## 2016-11-12 DIAGNOSIS — Z794 Long term (current) use of insulin: Secondary | ICD-10-CM | POA: Diagnosis not present

## 2016-11-12 DIAGNOSIS — E118 Type 2 diabetes mellitus with unspecified complications: Secondary | ICD-10-CM | POA: Diagnosis not present

## 2016-11-12 DIAGNOSIS — IMO0002 Reserved for concepts with insufficient information to code with codable children: Secondary | ICD-10-CM

## 2016-11-12 LAB — COMPREHENSIVE METABOLIC PANEL
ALT: 10 U/L (ref 0–35)
AST: 17 U/L (ref 0–37)
Albumin: 3.9 g/dL (ref 3.5–5.2)
Alkaline Phosphatase: 102 U/L (ref 39–117)
BUN: 22 mg/dL (ref 6–23)
CALCIUM: 9.4 mg/dL (ref 8.4–10.5)
CHLORIDE: 100 meq/L (ref 96–112)
CO2: 29 meq/L (ref 19–32)
CREATININE: 1.61 mg/dL — AB (ref 0.40–1.20)
GFR: 32.02 mL/min — ABNORMAL LOW (ref >60.00)
GLUCOSE: 126 mg/dL — AB (ref 70–99)
Potassium: 3.9 mEq/L (ref 3.5–5.1)
SODIUM: 139 meq/L (ref 135–145)
Total Bilirubin: 0.6 mg/dL (ref 0.2–1.2)
Total Protein: 6.9 g/dL (ref 6.0–8.3)

## 2016-11-12 LAB — MICROALBUMIN / CREATININE URINE RATIO
Creatinine,U: 75.2 mg/dL
MICROALB UR: 4 mg/dL — AB (ref 0.0–1.9)
Microalb Creat Ratio: 5.3 mg/g (ref 0.0–30.0)

## 2016-11-12 LAB — LIPID PANEL
CHOL/HDL RATIO: 4
Cholesterol: 147 mg/dL (ref 0–200)
HDL: 40.6 mg/dL (ref >39.00)
LDL CALC: 77 mg/dL (ref 0–99)
NONHDL: 106.3
TRIGLYCERIDES: 149 mg/dL (ref 0.0–149.0)
VLDL: 29.8 mg/dL (ref 0.0–40.0)

## 2016-11-12 LAB — HEMOGLOBIN A1C: Hgb A1c MFr Bld: 7.8 % — ABNORMAL HIGH (ref 4.6–6.5)

## 2016-11-12 NOTE — Addendum Note (Signed)
Addended by: Daralene Milch C on: 11/12/2016 11:28 AM   Modules accepted: Orders

## 2016-11-14 ENCOUNTER — Other Ambulatory Visit: Payer: Self-pay | Admitting: Family Medicine

## 2016-11-14 MED ORDER — LISINOPRIL 2.5 MG PO TABS: 1.2500 mg | ORAL_TABLET | Freq: Every day | ORAL | 3 refills | Status: DC

## 2016-11-23 ENCOUNTER — Other Ambulatory Visit: Payer: Self-pay | Admitting: Family Medicine

## 2016-11-28 ENCOUNTER — Encounter: Payer: Self-pay | Admitting: Emergency Medicine

## 2016-11-28 ENCOUNTER — Emergency Department: Payer: Medicare Other

## 2016-11-28 ENCOUNTER — Observation Stay
Admission: EM | Admit: 2016-11-28 | Discharge: 2016-11-29 | Disposition: A | Discharge status: Home / Self Care | Payer: Medicare Other | Attending: Internal Medicine | Admitting: Internal Medicine

## 2016-11-28 DIAGNOSIS — I493 Ventricular premature depolarization: Secondary | ICD-10-CM | POA: Insufficient documentation

## 2016-11-28 DIAGNOSIS — Z85820 Personal history of malignant melanoma of skin: Secondary | ICD-10-CM | POA: Diagnosis not present

## 2016-11-28 DIAGNOSIS — I25119 Atherosclerotic heart disease of native coronary artery with unspecified angina pectoris: Secondary | ICD-10-CM | POA: Insufficient documentation

## 2016-11-28 DIAGNOSIS — M48061 Spinal stenosis, lumbar region without neurogenic claudication: Secondary | ICD-10-CM | POA: Insufficient documentation

## 2016-11-28 DIAGNOSIS — I5032 Chronic diastolic (congestive) heart failure: Secondary | ICD-10-CM | POA: Diagnosis not present

## 2016-11-28 DIAGNOSIS — M8580 Other specified disorders of bone density and structure, unspecified site: Secondary | ICD-10-CM | POA: Diagnosis not present

## 2016-11-28 DIAGNOSIS — Z7982 Long term (current) use of aspirin: Secondary | ICD-10-CM | POA: Insufficient documentation

## 2016-11-28 DIAGNOSIS — R918 Other nonspecific abnormal finding of lung field: Secondary | ICD-10-CM

## 2016-11-28 DIAGNOSIS — E1122 Type 2 diabetes mellitus with diabetic chronic kidney disease: Secondary | ICD-10-CM | POA: Diagnosis not present

## 2016-11-28 DIAGNOSIS — R079 Chest pain, unspecified: Principal | ICD-10-CM | POA: Insufficient documentation

## 2016-11-28 DIAGNOSIS — I130 Hypertensive heart and chronic kidney disease with heart failure and stage 1 through stage 4 chronic kidney disease, or unspecified chronic kidney disease: Secondary | ICD-10-CM | POA: Diagnosis not present

## 2016-11-28 DIAGNOSIS — I700 Atherosclerosis of aorta: Secondary | ICD-10-CM | POA: Insufficient documentation

## 2016-11-28 DIAGNOSIS — M609 Myositis, unspecified: Secondary | ICD-10-CM | POA: Insufficient documentation

## 2016-11-28 DIAGNOSIS — E1151 Type 2 diabetes mellitus with diabetic peripheral angiopathy without gangrene: Secondary | ICD-10-CM | POA: Diagnosis not present

## 2016-11-28 DIAGNOSIS — I272 Pulmonary hypertension, unspecified: Secondary | ICD-10-CM | POA: Insufficient documentation

## 2016-11-28 DIAGNOSIS — Z951 Presence of aortocoronary bypass graft: Secondary | ICD-10-CM | POA: Diagnosis not present

## 2016-11-28 DIAGNOSIS — Z87891 Personal history of nicotine dependence: Secondary | ICD-10-CM | POA: Diagnosis not present

## 2016-11-28 DIAGNOSIS — E1165 Type 2 diabetes mellitus with hyperglycemia: Secondary | ICD-10-CM | POA: Diagnosis not present

## 2016-11-28 DIAGNOSIS — M7031 Other bursitis of elbow, right elbow: Secondary | ICD-10-CM | POA: Insufficient documentation

## 2016-11-28 DIAGNOSIS — E871 Hypo-osmolality and hyponatremia: Secondary | ICD-10-CM | POA: Diagnosis not present

## 2016-11-28 DIAGNOSIS — E785 Hyperlipidemia, unspecified: Secondary | ICD-10-CM | POA: Diagnosis not present

## 2016-11-28 DIAGNOSIS — J9811 Atelectasis: Secondary | ICD-10-CM

## 2016-11-28 DIAGNOSIS — Z794 Long term (current) use of insulin: Secondary | ICD-10-CM | POA: Insufficient documentation

## 2016-11-28 DIAGNOSIS — Z8249 Family history of ischemic heart disease and other diseases of the circulatory system: Secondary | ICD-10-CM | POA: Insufficient documentation

## 2016-11-28 DIAGNOSIS — N289 Disorder of kidney and ureter, unspecified: Secondary | ICD-10-CM

## 2016-11-28 DIAGNOSIS — Z79899 Other long term (current) drug therapy: Secondary | ICD-10-CM | POA: Insufficient documentation

## 2016-11-28 DIAGNOSIS — N183 Chronic kidney disease, stage 3 (moderate): Secondary | ICD-10-CM | POA: Diagnosis not present

## 2016-11-28 DIAGNOSIS — I10 Essential (primary) hypertension: Secondary | ICD-10-CM | POA: Diagnosis not present

## 2016-11-28 DIAGNOSIS — Z833 Family history of diabetes mellitus: Secondary | ICD-10-CM | POA: Insufficient documentation

## 2016-11-28 DIAGNOSIS — R7989 Other specified abnormal findings of blood chemistry: Secondary | ICD-10-CM

## 2016-11-28 DIAGNOSIS — R778 Other specified abnormalities of plasma proteins: Secondary | ICD-10-CM

## 2016-11-28 DIAGNOSIS — J449 Chronic obstructive pulmonary disease, unspecified: Secondary | ICD-10-CM | POA: Insufficient documentation

## 2016-11-28 DIAGNOSIS — R0789 Other chest pain: Secondary | ICD-10-CM | POA: Diagnosis not present

## 2016-11-28 LAB — CBC WITH DIFFERENTIAL/PLATELET
BASOS ABS: 0.1 10*3/uL (ref 0–0.1)
BASOS PCT: 1 %
EOS ABS: 1 10*3/uL — AB (ref 0–0.7)
Eosinophils Relative: 7 %
HEMATOCRIT: 39.9 % (ref 35.0–47.0)
HEMOGLOBIN: 13.1 g/dL (ref 12.0–16.0)
Lymphocytes Relative: 8 %
Lymphs Abs: 1.1 10*3/uL (ref 1.0–3.6)
MCH: 29.6 pg (ref 26.0–34.0)
MCHC: 32.8 g/dL (ref 32.0–36.0)
MCV: 90.3 fL (ref 80.0–100.0)
MONOS PCT: 5 %
Monocytes Absolute: 0.6 10*3/uL (ref 0.2–0.9)
NEUTROS ABS: 11.2 10*3/uL — AB (ref 1.4–6.5)
NEUTROS PCT: 79 %
Platelets: 238 10*3/uL (ref 150–440)
RBC: 4.42 MIL/uL (ref 3.80–5.20)
RDW: 13.2 % (ref 11.5–14.5)
WBC: 14 10*3/uL — AB (ref 3.6–11.0)

## 2016-11-28 LAB — COMPREHENSIVE METABOLIC PANEL
ALBUMIN: 3.7 g/dL (ref 3.5–5.0)
ALT: 13 U/L — ABNORMAL LOW (ref 14–54)
ANION GAP: 11 (ref 5–15)
AST: 23 U/L (ref 15–41)
Alkaline Phosphatase: 108 U/L (ref 38–126)
BUN: 33 mg/dL — AB (ref 6–20)
CHLORIDE: 99 mmol/L — AB (ref 101–111)
CO2: 23 mmol/L (ref 22–32)
Calcium: 9.1 mg/dL (ref 8.9–10.3)
Creatinine, Ser: 1.74 mg/dL — ABNORMAL HIGH (ref 0.44–1.00)
GFR calc Af Amer: 29 mL/min — ABNORMAL LOW (ref >60)
GFR, EST NON AFRICAN AMERICAN: 25 mL/min — AB (ref >60)
Glucose, Bld: 427 mg/dL — ABNORMAL HIGH (ref 65–99)
POTASSIUM: 4 mmol/L (ref 3.5–5.1)
Sodium: 133 mmol/L — ABNORMAL LOW (ref 135–145)
Total Bilirubin: 0.4 mg/dL (ref 0.3–1.2)
Total Protein: 7.3 g/dL (ref 6.5–8.1)

## 2016-11-28 LAB — TROPONIN I
TROPONIN I: 0.04 ng/mL — AB (ref <0.03)
Troponin I: <0.03 ng/mL (ref <0.03)

## 2016-11-28 LAB — LIPASE, BLOOD: LIPASE: 46 U/L (ref 11–51)

## 2016-11-28 MED ORDER — LISINOPRIL 2.5 MG PO TABS
1.2500 mg | ORAL_TABLET | Freq: Every day | ORAL | Status: DC
  Administered 2016-11-29: 1.25 mg via ORAL
  Filled 2016-11-28 (×2): qty 0.5

## 2016-11-28 MED ORDER — ISOSORBIDE MONONITRATE ER 30 MG PO TB24
30.0000 mg | ORAL_TABLET | Freq: Every day | ORAL | Status: DC
  Administered 2016-11-28 – 2016-11-29 (×2): 30 mg via ORAL
  Filled 2016-11-28 (×2): qty 1

## 2016-11-28 MED ORDER — GLIPIZIDE ER 2.5 MG PO TB24
5.0000 mg | ORAL_TABLET | Freq: Every day | ORAL | Status: DC
  Administered 2016-11-29: 5 mg via ORAL
  Filled 2016-11-28: qty 2

## 2016-11-28 MED ORDER — HEPARIN SODIUM (PORCINE) 5000 UNIT/ML IJ SOLN: 5000.0000 [IU] | Freq: Three times a day (TID) | INTRAMUSCULAR | Status: DC

## 2016-11-28 MED ORDER — SODIUM CHLORIDE 0.9% FLUSH
3.0000 mL | Freq: Two times a day (BID) | INTRAVENOUS | Status: DC
  Administered 2016-11-28: 3 mL via INTRAVENOUS

## 2016-11-28 MED ORDER — NITROGLYCERIN 0.4 MG SL SUBL: 0.4000 mg | SUBLINGUAL_TABLET | SUBLINGUAL | Status: DC | PRN

## 2016-11-28 MED ORDER — ASPIRIN EC 81 MG PO TBEC
81.0000 mg | DELAYED_RELEASE_TABLET | Freq: Every day | ORAL | Status: DC
  Administered 2016-11-28 – 2016-11-29 (×2): 81 mg via ORAL
  Filled 2016-11-28 (×2): qty 1

## 2016-11-28 MED ORDER — SIMVASTATIN 20 MG PO TABS
20.0000 mg | ORAL_TABLET | Freq: Every day | ORAL | Status: DC
  Administered 2016-11-28: 20 mg via ORAL
  Filled 2016-11-28: qty 1

## 2016-11-28 NOTE — Progress Notes (Signed)
Patient arrived to 2A Room 243. Patient denies pain and all questions answered. Patient oriented to unit and educated on using the call bell/room phone. Skin assessment completed with [name] and scattered ecchymosis scattered. A&Ox4, VSS, and NSR/ST on verified tele-box #[id number]. Nursing staff will continue to monitor for any changes in patient status. Donna Lane

## 2016-11-28 NOTE — ED Notes (Signed)
Donna Lane , daughter [phone]

## 2016-11-28 NOTE — ED Provider Notes (Signed)
[facility] Emergency Department Provider Note  ____________________________________________   I have reviewed the triage vital signs and the nursing notes.   HISTORY  Chief Complaint Chest Pain    HPI Donna Lane is a 82 y.o. female history of cardiac disease states that she had substernal gradual onset chest pain which was crushing, radiated to the left arm, Sr. with nausea and diaphoresis. Did not radiate to the back. Not at maximum intensity at onset. Like prior ACS, angina symptoms. Has not had since her coverage that she can recall. Nitroglycerin and aspirin were given by EMS and her pain went away. She is pain-free at this time.     Past Medical History:  Diagnosis Date  . CHF (congestive heart failure) ([place])   . Chronic diastolic heart failure (HCC)    with pulmonary HTN  . Chronic kidney disease   . COPD (chronic obstructive pulmonary disease) ([place])   . Coronary artery disease 2008   CABG x 4,   . Diabetes mellitus   . Diverticulosis    on colonoscopy 08/12/2006  . Hyperlipidemia   . Hypertension   . Melanoma ([place])    removed by derm   . PVD (peripheral vascular disease) ([place])   . Spinal stenosis     Patient Active Problem List   Diagnosis Date Noted  . Left eye complaint 07/14/2016  . Caregiver stress 07/14/2016  . Olecranon bursitis of right elbow 06/19/2016  . Myalgia and myositis 05/20/2016  . Chronic kidney disease, stage 3 01/09/2016  . Advance care planning 01/05/2016  . Spinal stenosis of lumbar region 01/05/2016  . Chronic diastolic CHF (congestive heart failure) ([place]) 10/12/2013  . Diabetes mellitus type 2, uncontrolled, with complications ([place]) 29/56/2130  . Hyperlipidemia 03/14/2010  . Essential hypertension 03/14/2010  . CORONARY ATHEROSLERO AUTOL VEIN BYPASS GRAFT 03/14/2010  . Peripheral arterial disease ([place]) 03/14/2010  . CHEST PAIN-UNSPECIFIED 03/14/2010    Past Surgical History:  Procedure Laterality  Date  . CARDIAC CATHETERIZATION     PTCA x 3  . CATARACT EXTRACTION    . CORONARY ARTERY BYPASS GRAFT  2008   x 4  . HIP FRACTURE SURGERY Left    pins placed w/o hip replacement, hardware removed later  . LUMBAR DISC SURGERY     in her 49s  . tonsillectomy    . VAGINAL DELIVERY     x2    Prior to Admission medications   Medication Sig Start Date End Date Taking? Authorizing Provider  acetaminophen (TYLENOL) 500 MG tablet Take 500 mg by mouth daily.    Yes Historical Provider, MD  aspirin EC 81 MG tablet Take by mouth daily.    Yes Historical Provider, MD  furosemide (LASIX) 40 MG tablet TAKE ONE TABLET BY MOUTH TWICE DAILY 09/18/16  Yes Tonia Ghent, MD  glipiZIDE (GLUCOTROL XL) 5 MG 24 hr tablet TAKE ONE TABLET BY MOUTH IN THE MORNING 11/05/16  Yes Tonia Ghent, MD  hydrALAZINE (APRESOLINE) 25 MG tablet TAKE ONE TABLET BY MOUTH TWICE DAILY 11/25/16  Yes Tonia Ghent, MD  Insulin Detemir (LEVEMIR) 100 UNIT/ML Pen Inject 3-5 Units into the skin at bedtime as needed (for hyperglycemia). 02/29/16  Yes Tonia Ghent, MD  lisinopril (ZESTRIL) 2.5 MG tablet Take 0.5 tablets (1.25 mg total) by mouth daily. 11/14/16  Yes Tonia Ghent, MD  nitroGLYCERIN (NITROSTAT) 0.4 MG SL tablet Place 0.4 mg under the tongue every 5 (five) minutes as needed for chest pain.  Yes Historical Provider, MD  potassium chloride SA (K-DUR,KLOR-CON) 20 MEQ tablet Take 1 tablet (20 mEq total) by mouth daily. 06/28/16  Yes Tonia Ghent, MD  simvastatin (ZOCOR) 20 MG tablet TAKE ONE TABLET BY MOUTH ONCE DAILY 10/08/16  Yes Minna Merritts, MD  isosorbide mononitrate (IMDUR) 60 MG 24 hr tablet TAKE ONE-HALF TABLET BY MOUTH ONCE DAILY Patient not taking: Reported on 11/28/2016 10/23/16   Minna Merritts, MD    Allergies Nsaids  Family History  Problem Relation Age of Onset  . Heart disease Father   . Heart disease Sister   . Diabetes Sister     Social History Social History  Substance Use  Topics  . Smoking status: Former Smoker    Packs/day: 1.00    Years: 50.00    Types: Cigarettes    Quit date: 03/20/2007  . Smokeless tobacco: Never Used  . Alcohol use No    Review of Systems Constitutional: No fever/chills Eyes: No visual changes. ENT: No sore throat. No stiff neck no neck pain Cardiovascular: Positive chest pain. Respiratory: Positive shortness of breath. Gastrointestinal:   no vomiting.  No diarrhea.  No constipation. Genitourinary: Negative for dysuria. Musculoskeletal: Negative lower extremity swelling Skin: Negative for rash. Neurological: Negative for severe headaches, focal weakness or numbness. 10-point ROS otherwise negative.  ____________________________________________   PHYSICAL EXAM:  VITAL SIGNS: ED Triage Vitals  Enc Vitals Group     BP 11/28/16 1200 (!) 156/59     Pulse Rate 11/28/16 1200 (!) 102     Resp 11/28/16 1200 16     Temp 11/28/16 1200 98.4 F (36.9 C)     Temp Source 11/28/16 1200 Oral     SpO2 11/28/16 1200 100 %     Weight 11/28/16 1159 118 lb (53.5 kg)     Height 11/28/16 1159 '5\' 4"'$  (1.626 m)     Head Circumference --      Peak Flow --      Pain Score 11/28/16 1530 0     Pain Loc --      Pain Edu? --      Excl. in [place]? --     Constitutional: Alert and oriented. Well appearing and in no acute distress. Eyes: Conjunctivae are normal. PERRL. EOMI. Head: Atraumatic. Nose: No congestion/rhinnorhea. Mouth/Throat: Mucous membranes are moist.  Oropharynx non-erythematous. Neck: No stridor.   Nontender with no meningismus Cardiovascular: Normal rate, regular rhythm. Grossly normal heart sounds.  Good peripheral circulation. Respiratory: Normal respiratory effort.  No retractions. Lungs CTAB. Abdominal: Soft and nontender. No distention. No guarding no rebound Back:  There is no focal tenderness or step off.  there is no midline tenderness there are no lesions noted. there is no CVA tenderness Musculoskeletal: No lower  extremity tenderness, no upper extremity tenderness. No joint effusions, no DVT signs strong distal pulses no edema Neurologic:  Normal speech and language. No gross focal neurologic deficits are appreciated.  Skin:  Skin is warm, dry and intact. No rash noted. Psychiatric: Mood and affect are normal. Speech and behavior are normal.  ____________________________________________   LABS (all labs ordered are listed, but only abnormal results are displayed)  Labs Reviewed  COMPREHENSIVE METABOLIC PANEL - Abnormal; Notable for the following:       Result Value   Sodium 133 (*)    Chloride 99 (*)    Glucose, Bld 427 (*)    BUN 33 (*)    Creatinine, Ser 1.74 (*)    ALT  13 (*)    GFR calc non Af Amer 25 (*)    GFR calc Af Amer 29 (*)    All other components within normal limits  TROPONIN I - Abnormal; Notable for the following:    Troponin I 0.04 (*)    All other components within normal limits  CBC WITH DIFFERENTIAL/PLATELET - Abnormal; Notable for the following:    WBC 14.0 (*)    Neutro Abs 11.2 (*)    Eosinophils Absolute 1.0 (*)    All other components within normal limits  LIPASE, BLOOD   ____________________________________________  EKG  I personally interpreted any EKGs ordered by me or triage Sinus tach rate 103 no acute ST elevation, normal axis, nonspecific ST changes ____________________________________________  RADIOLOGY  I reviewed any imaging ordered by me or triage that were performed during my shift and, if possible, patient and/or family made aware of any abnormal findings. ____________________________________________   PROCEDURES  Procedure(s) performed: None  Procedures  Critical Care performed: None  ____________________________________________   INITIAL IMPRESSION / ASSESSMENT AND PLAN / ED COURSE  Pertinent labs & imaging results that were available during my care of the patient were reviewed by me and considered in my medical decision making  (see chart for details).  She went to [facility] versus her anginal equivalent. She is chest pain-free here. Initial troponin is borderline. We will admit her to the hospital for further evaluation of her very concerning chest pain story.  Second is the patient has a new diagnosis of what appears to be a lung cancer with partial lung lobe collapse. Have made the patient aware of this finding, she will be admitted for further evaluation.  Clinical Course    ____________________________________________   FINAL CLINICAL IMPRESSION(S) / ED DIAGNOSES  Final diagnoses:  None      This chart was dictated using voice recognition software.  Despite best efforts to proofread,  errors can occur which can change meaning.      Schuyler Amor, MD 11/28/16 [phone]

## 2016-11-28 NOTE — Progress Notes (Signed)
Family Meeting Note  Advance Directive:yes  Today a meeting took place with the Patient. And daughter.  The following clinical team members were present during this meeting:MD  The following were discussed:Patient's diagnosis: CAD and new found lung mass , Patient's progosis: Unable to determine and Goals for treatment: Full Code  Additional follow-up to be provided: cardiology.  Time spent during discussion:20 minutes  Sondi Desch, Rosalio Macadamia, MD

## 2016-11-28 NOTE — ED Triage Notes (Signed)
Reports chest pain onset today.  Denies sob or diaphoresis or any other sx.

## 2016-11-28 NOTE — Progress Notes (Signed)
Patient told this RN that she doesn't want to have a stress test and wants to go home as she has a funeral for an immediate family member to attend tomorrow afternoon. She is also the sole caretaker for her husband. MD aware and stated the patient is free to decide. Patient is agreeable to stay overnight for monitoring, but wants to be discharged as soon as possible. Earleen Reaper, RN

## 2016-11-28 NOTE — ED Notes (Signed)
Pt given sandwich tray at this time  

## 2016-11-28 NOTE — ED Notes (Signed)
Dr Burlene Arnt aware of trop level

## 2016-11-28 NOTE — H&P (Signed)
[facility] [place] NAME: Donna Lane    MR#:  [id number]  DATE OF BIRTH:  02-Jun-1925  DATE OF ADMISSION:  11/28/2016  PRIMARY CARE PHYSICIAN: [name], MD   REQUESTING/REFERRING PHYSICIAN: Burlene Arnt  CHIEF COMPLAINT:   Chief Complaint  Patient presents with  . Chest Pain    HISTORY OF PRESENT ILLNESS: Donna Lane  is a 82 y.o. female with a known history of Congestive heart failure,, COPD, history of CABG, diabetes, hypertension, hyperlipidemia, peripheral vascular disease- had some argument with her husband today and started having left-sided chest pain which was pressure-like concerned with this she called fire department and took nitroglycerin tablet and 4 aspirin tablets, which relieved her pain and so she was brought to the emergency room. In ER her troponin was borderline elevated but because of her history of coronary artery disease and typical chest pain which resolved with nitroglycerin she was given his admission to hospitalist team. On further workup he also did a CT scan of the chest which showed a small mass in her lung with the atelectasis in the lobe of the lung. On further questioning patient does not want to even hear about that or think about that but she and her daughter are aware about this finding.   PAST MEDICAL HISTORY:   Past Medical History:  Diagnosis Date  . CHF (congestive heart failure) ([place])   . Chronic diastolic heart failure (H[facility])    with pulmonary HTN  . Chronic kidney disease   . COPD (chronic obstructive pulmonary disease) ([place])   . Coronary artery disease 2008   CABG x 4,   . Diabetes mellitus   . Diverticulosis    on colonoscopy 08/12/2006  . Hyperlipidemia   . Hypertension   . Melanoma ([place])    removed by derm   . PVD (peripheral vascular disease) ([place])   . Spinal stenosis     PAST SURGICAL HISTORY: Past Surgical History:  Procedure Laterality Date  . CARDIAC CATHETERIZATION     PTCA x 3  . CATARACT EXTRACTION    . CORONARY ARTERY BYPASS GRAFT  2008   x 4  . HIP FRACTURE SURGERY Left    pins placed w/o hip replacement, hardware removed later  . LUMBAR DISC SURGERY     in her 73s  . tonsillectomy    . VAGINAL DELIVERY     x2    SOCIAL HISTORY:  Social History  Substance Use Topics  . Smoking status: Former Smoker    Packs/day: 1.00    Years: 50.00    Types: Cigarettes    Quit date: 03/20/2007  . Smokeless tobacco: Never Used  . Alcohol use No    FAMILY HISTORY:  Family History  Problem Relation Age of Onset  . Heart disease Father   . Heart disease Sister   . Diabetes Sister     DRUG ALLERGIES:  Allergies  Allergen Reactions  . Nsaids Other (See Comments)    Would avoid due to CKD.... Pt states she not allergic    REVIEW OF SYSTEMS:   CONSTITUTIONAL: No fever, fatigue or weakness.  EYES: No blurred or double vision.  EARS, NOSE, AND THROAT: No tinnitus or ear pain.  RESPIRATORY: No cough, shortness of breath, wheezing or hemoptysis.  CARDIOVASCULAR: positive for chest pain, no orthopnea, edema.  GASTROINTESTINAL: No nausea, vomiting, diarrhea or abdominal pain.  GENITOURINARY: No dysuria, hematuria.  ENDOCRINE: No polyuria, nocturia,  HEMATOLOGY: No anemia, easy bruising  or bleeding SKIN: No rash or lesion. MUSCULOSKELETAL: No joint pain or arthritis.   NEUROLOGIC: No tingling, numbness, weakness.  PSYCHIATRY: No anxiety or depression.   MEDICATIONS AT HOME:  Prior to Admission medications   Medication Sig Start Date End Date Taking? Authorizing Provider  acetaminophen (TYLENOL) 500 MG tablet Take 500 mg by mouth daily.    Yes Historical Provider, MD  aspirin EC 81 MG tablet Take by mouth daily.    Yes Historical Provider, MD  furosemide (LASIX) 40 MG tablet TAKE ONE TABLET BY MOUTH TWICE DAILY 09/18/16  Yes Tonia Ghent, MD  glipiZIDE (GLUCOTROL XL) 5 MG 24 hr tablet TAKE ONE TABLET BY MOUTH IN THE MORNING 11/05/16  Yes Tonia Ghent, MD  hydrALAZINE (APRESOLINE) 25 MG tablet TAKE ONE TABLET BY MOUTH TWICE DAILY 11/25/16  Yes Tonia Ghent, MD  Insulin Detemir (LEVEMIR) 100 UNIT/ML Pen Inject 3-5 Units into the skin at bedtime as needed (for hyperglycemia). 02/29/16  Yes Tonia Ghent, MD  lisinopril (ZESTRIL) 2.5 MG tablet Take 0.5 tablets (1.25 mg total) by mouth daily. 11/14/16  Yes Tonia Ghent, MD  nitroGLYCERIN (NITROSTAT) 0.4 MG SL tablet Place 0.4 mg under the tongue every 5 (five) minutes as needed for chest pain.    Yes Historical Provider, MD  potassium chloride SA (K-DUR,KLOR-CON) 20 MEQ tablet Take 1 tablet (20 mEq total) by mouth daily. 06/28/16  Yes Tonia Ghent, MD  simvastatin (ZOCOR) 20 MG tablet TAKE ONE TABLET BY MOUTH ONCE DAILY 10/08/16  Yes Minna Merritts, MD  isosorbide mononitrate (IMDUR) 60 MG 24 hr tablet TAKE ONE-HALF TABLET BY MOUTH ONCE DAILY Patient not taking: Reported on 11/28/2016 10/23/16   Minna Merritts, MD      PHYSICAL EXAMINATION:   VITAL SIGNS: Blood pressure 140/70, pulse 94, temperature 98.4 F (36.9 C), temperature source Oral, resp. rate (!) 22, height '5\' 4"'$  (1.626 m), weight 53.5 kg (118 lb), SpO2 100 %.  GENERAL:  82 y.o.-year-old patient lying in the bed with no acute distress.  EYES: Pupils equal, round, reactive to light and accommodation. No scleral icterus. Extraocular muscles intact.  HEENT: Head atraumatic, normocephalic. Oropharynx and nasopharynx clear.  NECK:  Supple, no jugular venous distention. No thyroid enlargement, no tenderness.  LUNGS: Normal breath sounds bilaterally, no wheezing, rales,rhonchi or crepitation. No use of accessory muscles of respiration.  CARDIOVASCULAR: S1, S2 normal. No murmurs, rubs, or gallops.  ABDOMEN: Soft, nontender, nondistended. Bowel sounds present. No organomegaly or mass.  EXTREMITIES: No pedal edema, cyanosis, or clubbing.  NEUROLOGIC: Cranial nerves II through XII are intact. Muscle strength 5/5 in all  extremities. Sensation intact. Gait not checked.  PSYCHIATRIC: The patient is alert and oriented x 3.  SKIN: No obvious rash, lesion, or ulcer.   LABORATORY PANEL:   CBC  Recent Labs Lab 11/28/16 1230  WBC 14.0*  HGB 13.1  HCT 39.9  PLT 238  MCV 90.3  MCH 29.6  MCHC 32.8  RDW 13.2  LYMPHSABS 1.1  MONOABS 0.6  EOSABS 1.0*  BASOSABS 0.1   ------------------------------------------------------------------------------------------------------------------  Chemistries   Recent Labs Lab 11/28/16 1230  NA 133*  K 4.0  CL 99*  CO2 23  GLUCOSE 427*  BUN 33*  CREATININE 1.74*  CALCIUM 9.1  AST 23  ALT 13*  ALKPHOS 108  BILITOT 0.4   ------------------------------------------------------------------------------------------------------------------ estimated creatinine clearance is 18.5 mL/min (by C-G formula based on SCr of 1.74 mg/dL (H)). ------------------------------------------------------------------------------------------------------------------ No results for input(s): TSH,  T4TOTAL, T3FREE, THYROIDAB in the last 72 hours.  Invalid input(s): FREET3   Coagulation profile No results for input(s): INR, PROTIME in the last 168 hours. ------------------------------------------------------------------------------------------------------------------- No results for input(s): DDIMER in the last 72 hours. -------------------------------------------------------------------------------------------------------------------  Cardiac Enzymes  Recent Labs Lab 11/28/16 1230  TROPONINI 0.04*   ------------------------------------------------------------------------------------------------------------------ Invalid input(s): POCBNP  ---------------------------------------------------------------------------------------------------------------  Urinalysis    Component Value Date/Time   COLORURINE YELLOW 08/28/2007 0903   APPEARANCEUR CLOUDY (A) 08/28/2007 0903    LABSPEC 1.007 08/28/2007 0903   PHURINE 7.0 08/28/2007 0903   GLUCOSEU NEGATIVE 08/28/2007 0903   HGBUR SMALL (A) 08/28/2007 0903   BILIRUBINUR NEGATIVE 08/28/2007 0903   KETONESUR NEGATIVE 08/28/2007 0903   PROTEINUR NEGATIVE 08/28/2007 0903   UROBILINOGEN 0.2 08/28/2007 0903   NITRITE NEGATIVE 08/28/2007 0903   LEUKOCYTESUR LARGE (A) 08/28/2007 0903     RADIOLOGY: Dg Chest 2 View  Result Date: 11/28/2016 CLINICAL DATA:  82 year old female with chest pain onset this morning. Initial encounter. EXAM: CHEST  2 VIEW COMPARISON:  Chest radiographs 04/16/2014 and earlier. FINDINGS: New medial segment right middle lobe collapse or consolidation. Mediastinal contours appear stable and within normal limits. Sequelae of CABG. Calcified aortic atherosclerosis. No superimposed pneumothorax, pulmonary edema or pleural effusion. No other confluent pulmonary opacity. Stable lung volumes. Osteopenia. No acute osseous abnormality identified. IMPRESSION: 1. New medial segment right middle lobe collapse or consolidation. Top differential considerations are right middle lobe pneumonia versus bronchial obstruction. 2. No pleural effusion or other acute cardiopulmonary abnormality. 3.  Calcified aortic atherosclerosis. Electronically Signed   By: Genevie Ann M.D.   On: 11/28/2016 13:30   Ct Chest Wo Contrast  Result Date: 11/28/2016 CLINICAL DATA:  Chest pain EXAM: CT CHEST WITHOUT CONTRAST TECHNIQUE: Multidetector CT imaging of the chest was performed following the standard protocol without IV contrast. COMPARISON:  Chest x-ray from earlier in the same day FINDINGS: Cardiovascular: Diffuse aortic calcifications are noted without aneurysmal dilatation. Heavy coronary calcifications are seen. Changes of prior coronary bypass grafting are noted. Pulmonary artery as visualized is within normal limits. Cardiac structures are within normal limits. Mediastinum/Nodes: Thoracic inlet is unremarkable. There is a 12 mm short  axis lymph node anterior to the carina. No other significant mediastinal or hilar adenopathy is seen. Lungs/Pleura: Left lung is well aerated. A tiny nodule is noted in the upper lobe on image number 22 of series 3. 4 mm nodule is noted lower in the left upper lobe on image number 45 of series 3. The right lung shows consolidation of the right middle lobe although there is apparent attenuation difference in the right middle lobe which measures approximately 4.6 cm in greatest dimension. This appears to be causing bronchial obstruction with subsequent collapse of the right middle lobe. The bronchial occlusion is best seen on image number 75 of series 2. Upper Abdomen: The upper abdomen demonstrates a partially calcified 1 cm right renal artery aneurysm hypodense lesion is noted in the left kidney which may represent cyst. Musculoskeletal: No acute bony abnormality is noted. IMPRESSION: Changes in the right middle lobe consistent with a central obstructing mass lesion as described with the right middle lobe collapse. Bronchoscopic evaluation is recommended. Tiny nodular densities within the left lung as described. Electronically Signed   By: Inez Catalina M.D.   On: 11/28/2016 14:11    EKG: Orders placed or performed during the hospital encounter of 11/28/16  . EKG 12-Lead  . EKG 12-Lead    IMPRESSION AND PLAN:  * Chest pain-  angina  Patient has history of coronary artery disease and CABG   Monitor on telemetry for, follow serial troponin, keep nothing by mouth from midnight and get cardiology consult for tomorrow morning.   Continue cardiac meds.  * Right lung mass with atelectasis  Possible malignancy with history of chronic smoking.   Pertinent to this finding on CT scan and possibility of cancer, and they're normal date but patient does not want to do any further workup and does not want to discuss that issue anymore.   I informed them if they want to have more workup about that in future they  can talk to her primary care doctor.  * Hypertension   Continue lisinopril , Imdur , hold diuretics.  * Diabetes   Continue glipizide   Keep on sliding scale coverage.    * Hyperlipidemia   Continue statin.    All the records are reviewed and case discussed with ED provider. Management plans discussed with the patient, family and they are in agreement.  CODE STATUS: full. Code Status History    This patient does not have a recorded code status. Please follow your organizational policy for patients in this situation.     Patient's daughter was present in the room during my visit.  TOTAL TIME TAKING CARE OF THIS PATIENT: 50 minutes.    Vaughan Basta M.D on 11/28/2016   Between 7am to 6pm - Pager - [phone]  After 6pm go to [email or web address] - password [email or web address]  Office  [phone]  CC: Primary care physician; [name], MD   Note: This dictation was prepared with Dragon dictation along with smaller phrase technology. Any transcriptional errors that result from this process are unintentional.

## 2016-11-28 NOTE — ED Notes (Signed)
Pt reports having chest pain this am.  Was given asa and 1 nitro by EMS.  States pain has resolved. Denies n/v, diaphoresis or sob.  Pt reports being under a lot of stress taking care of her husbands tube feedings.

## 2016-11-29 ENCOUNTER — Other Ambulatory Visit: Payer: PRIVATE HEALTH INSURANCE

## 2016-11-29 DIAGNOSIS — R7989 Other specified abnormal findings of blood chemistry: Secondary | ICD-10-CM

## 2016-11-29 DIAGNOSIS — N289 Disorder of kidney and ureter, unspecified: Secondary | ICD-10-CM | POA: Diagnosis not present

## 2016-11-29 DIAGNOSIS — R778 Other specified abnormalities of plasma proteins: Secondary | ICD-10-CM

## 2016-11-29 DIAGNOSIS — R918 Other nonspecific abnormal finding of lung field: Secondary | ICD-10-CM | POA: Diagnosis not present

## 2016-11-29 DIAGNOSIS — J9811 Atelectasis: Secondary | ICD-10-CM

## 2016-11-29 DIAGNOSIS — E871 Hypo-osmolality and hyponatremia: Secondary | ICD-10-CM | POA: Diagnosis not present

## 2016-11-29 DIAGNOSIS — R079 Chest pain, unspecified: Secondary | ICD-10-CM | POA: Diagnosis not present

## 2016-11-29 LAB — CBC
HEMATOCRIT: 34 % — AB (ref 35.0–47.0)
Hemoglobin: 11.5 g/dL — ABNORMAL LOW (ref 12.0–16.0)
MCH: 29.9 pg (ref 26.0–34.0)
MCHC: 33.8 g/dL (ref 32.0–36.0)
MCV: 88.4 fL (ref 80.0–100.0)
PLATELETS: 241 10*3/uL (ref 150–440)
RBC: 3.85 MIL/uL (ref 3.80–5.20)
RDW: 12.6 % (ref 11.5–14.5)
WBC: 13.2 10*3/uL — AB (ref 3.6–11.0)

## 2016-11-29 LAB — TROPONIN I: Troponin I: <0.03 ng/mL (ref <0.03)

## 2016-11-29 LAB — BASIC METABOLIC PANEL
Anion gap: 8 (ref 5–15)
BUN: 30 mg/dL — AB (ref 6–20)
CHLORIDE: 102 mmol/L (ref 101–111)
CO2: 24 mmol/L (ref 22–32)
CREATININE: 1.45 mg/dL — AB (ref 0.44–1.00)
Calcium: 8.5 mg/dL — ABNORMAL LOW (ref 8.9–10.3)
GFR calc Af Amer: 36 mL/min — ABNORMAL LOW (ref >60)
GFR calc non Af Amer: 31 mL/min — ABNORMAL LOW (ref >60)
GLUCOSE: 271 mg/dL — AB (ref 65–99)
POTASSIUM: 4 mmol/L (ref 3.5–5.1)
SODIUM: 134 mmol/L — AB (ref 135–145)

## 2016-11-29 LAB — GLUCOSE, CAPILLARY: Glucose-Capillary: 118 mg/dL — ABNORMAL HIGH (ref 65–99)

## 2016-11-29 NOTE — Discharge Summary (Signed)
[facility] NAME: Donna Lane    MR#:  [id number]  DATE OF BIRTH:  12-12-1925  DATE OF ADMISSION:  11/28/2016 ADMITTING PHYSICIAN: Vaughan Basta, MD  DATE OF DISCHARGE: 11/29/2016 12:44 PM  PRIMARY CARE PHYSICIAN: [name], MD     ADMISSION DIAGNOSIS:  Lung mass [R91.8] Chest pain [R07.9] Chest pain, unspecified type [R07.9]  DISCHARGE DIAGNOSIS:  Principal Problem:   Chest pain Active Problems:   Elevated troponin   Acute renal insufficiency   Lung mass   Hyponatremia   Atelectasis of right lung   SECONDARY DIAGNOSIS:   Past Medical History:  Diagnosis Date  . CHF (congestive heart failure) ([place])   . Chronic diastolic heart failure (HCC)    with pulmonary HTN  . Chronic kidney disease   . COPD (chronic obstructive pulmonary disease) ([place])   . Coronary artery disease 2008   CABG x 4,   . Diabetes mellitus   . Diverticulosis    on colonoscopy 08/12/2006  . Hyperlipidemia   . Hypertension   . Melanoma ([place])    removed by derm   . PVD (peripheral vascular disease) ([place])   . Spinal stenosis     .pro HOSPITAL COURSE:   The patient is a 82-year-old Caucasian female with past medical history significant for history of spinal stenosis, congestive heart failure, coronary artery disease, COPD, diabetes, hypertension, hyperlipidemia, peripheral vascular disease, who presented to the hospital with complaints of left-sided chest pain, pressure-like. Patient took some nitroglycerin and aspirin, her pain was relieved and she was brought to emergency room for further evaluation. Chest x-ray revealed right middle lobe collapse or consolidation, CT of the chest was performed without contrast revealing central obstructing mass lesion in the right middle lobe, bronchoscopy was recommended. I discussed this patient quite extensively her condition and radiologic studies, offered pulmonologist evaluation in  the hospital or outside of the hospital, which she flatly refused. She did not want me to discuss with any family members including her son or her husband as well as. Since patient's initial troponin was minimally elevated at 0.04, although subsequent troponins were within normal limits, cardiology consultation was requested. Stress test was recommended, however, patient declined stress test and wished to be discharged. Follow-up with Dr. Candis Musa was recommended. Discussion by problem: #1. Chest pain, possibly related to cardiac issues, however, cannot rule due to lung mass , patient refused all workup including stress test, bronchoscopy, pulmonary, oncology follow-up as outpatient. Patient is recommended to follow-up with Dr. Candis Musa within few days after discharge, she was agreeable #2. Acute renal insufficiency, some improved with IV fluid administration, follow-up as outpatient #3. Elevated troponin , first test, subsequent 3 more testings were negative, cardiologist did not feel it was acute coronary syndrome, stress test was recommended, however, refused. Per patient, patient is to follow up with cardiologist as outpatient #4. Right bronchial mass, patient refused to pulmonary for follow-up or inpatient consultation, she did not want to have any studies done including bronchoscopy, she did not think she had lung cancer. She told me that she discuss this with her daughter, who knows about her condition, but she asked me to specifically not to discuss this with her son or husband, which I honored. Patient may benefit from hospice follow-up as outpatient, although when I discussed this with her. She refused since patient did not want her husband to son to know about her condition.  #5. Right middle lobe atelectasis, patient is  asymptomatic, oxygenation is satisfactory, patient is to follow-up with her primary care physician for further recommendations, she may benefit from oxygen supplementation in the long  run.   DISCHARGE CONDITIONS:   Guarded  CONSULTS OBTAINED:    DRUG ALLERGIES:   Allergies  Allergen Reactions  . Nsaids Other (See Comments)    Would avoid due to CKD.... Pt states she not allergic    DISCHARGE MEDICATIONS:   Discharge Medication List as of 11/29/2016 12:05 PM    CONTINUE these medications which have NOT CHANGED   Details  acetaminophen (TYLENOL) 500 MG tablet Take 500 mg by mouth daily. , Historical Med    aspirin EC 81 MG tablet Take by mouth daily. , Until Discontinued, Historical Med    glipiZIDE (GLUCOTROL XL) 5 MG 24 hr tablet TAKE ONE TABLET BY MOUTH IN THE MORNING, Normal    hydrALAZINE (APRESOLINE) 25 MG tablet TAKE ONE TABLET BY MOUTH TWICE DAILY, Normal    Insulin Detemir (LEVEMIR) 100 UNIT/ML Pen Inject 3-5 Units into the skin at bedtime as needed (for hyperglycemia)., Starting Thu 02/29/2016, Normal    lisinopril (ZESTRIL) 2.5 MG tablet Take 0.5 tablets (1.25 mg total) by mouth daily., Starting Thu 11/14/2016, Normal    nitroGLYCERIN (NITROSTAT) 0.4 MG SL tablet Place 0.4 mg under the tongue every 5 (five) minutes as needed for chest pain. , Until Discontinued, Historical Med    simvastatin (ZOCOR) 20 MG tablet TAKE ONE TABLET BY MOUTH ONCE DAILY, Normal    isosorbide mononitrate (IMDUR) 60 MG 24 hr tablet TAKE ONE-HALF TABLET BY MOUTH ONCE DAILY, Normal      STOP taking these medications     furosemide (LASIX) 40 MG tablet      potassium chloride SA (K-DUR,KLOR-CON) 20 MEQ tablet          DISCHARGE INSTRUCTIONS:    Patient is to follow-up with her primary care physician and cardiologist as outpatient  If you experience worsening of your admission symptoms, develop shortness of breath, life threatening emergency, suicidal or homicidal thoughts you must seek medical attention immediately by calling 911 or calling your MD immediately  if symptoms less severe.  You Must read complete instructions/literature along with all the  possible adverse reactions/side effects for all the Medicines you take and that have been prescribed to you. Take any new Medicines after you have completely understood and accept all the possible adverse reactions/side effects.   Please note  You were cared for by a hospitalist during your hospital stay. If you have any questions about your discharge medications or the care you received while you were in the hospital after you are discharged, you can call the unit and asked to speak with the hospitalist on call if the hospitalist that took care of you is not available. Once you are discharged, your primary care physician will handle any further medical issues. Please note that NO REFILLS for any discharge medications will be authorized once you are discharged, as it is imperative that you return to your primary care physician (or establish a relationship with a primary care physician if you do not have one) for your aftercare needs so that they can reassess your need for medications and monitor your lab values.    Today   CHIEF COMPLAINT:   Chief Complaint  Patient presents with  . Chest Pain    HISTORY OF PRESENT ILLNESS:  Donna Lane  is a 82 y.o. female with a known history of  spinal stenosis, congestive heart  failure, coronary artery disease, COPD, diabetes, hypertension, hyperlipidemia, peripheral vascular disease, who presented to the hospital with complaints of left-sided chest pain, pressure-like. Patient took some nitroglycerin and aspirin, her pain was relieved and she was brought to emergency room for further evaluation. Chest x-ray revealed right middle lobe collapse or consolidation, CT of the chest was performed without contrast revealing central obstructing mass lesion in the right middle lobe, bronchoscopy was recommended. I discussed this patient quite extensively her condition and radiologic studies, offered pulmonologist evaluation in the hospital or outside of the  hospital, which she flatly refused. She did not want me to discuss with any family members including her son or her husband as well as. Since patient's initial troponin was minimally elevated at 0.04, although subsequent troponins were within normal limits, cardiology consultation was requested. Stress test was recommended, however, patient declined stress test and wished to be discharged. Follow-up with Dr. Candis Musa was recommended. Discussion by problem: #1. Chest pain, possibly related to cardiac issues, however, cannot rule due to lung mass , patient refused all workup including stress test, bronchoscopy, pulmonary, oncology follow-up as outpatient. Patient is recommended to follow-up with Dr. Candis Musa within few days after discharge, she was agreeable #2. Acute renal insufficiency, some improved with IV fluid administration, follow-up as outpatient #3. Elevated troponin , first test, subsequent 3 more testings were negative, cardiologist did not feel it was acute coronary syndrome, stress test was recommended, however, refused. Per patient, patient is to follow up with cardiologist as outpatient #4. Right bronchial mass, patient refused to pulmonary for follow-up or inpatient consultation, she did not want to have any studies done including bronchoscopy, she did not think she had lung cancer. She told me that she discuss this with her daughter, who knows about her condition, but she asked me to specifically not to discuss this with her son or husband, which I honored. Patient may benefit from hospice follow-up as outpatient, although when I discussed this with her. She refused since patient did not want her husband to son to know about her condition.  #5. Right middle lobe atelectasis, patient is asymptomatic, oxygenation is satisfactory, patient is to follow-up with her primary care physician for further recommendations, she may benefit from oxygen supplementation in the long run.     VITAL SIGNS:  Blood  pressure (!) 145/61, pulse 91, temperature 97.4 F (36.3 C), temperature source Oral, resp. rate 18, height '5\' 4"'$  (1.626 m), weight 54.7 kg (120 lb 11.2 oz), SpO2 99 %.  I/O:   Intake/Output Summary (Last 24 hours) at 11/29/16 1535 Last data filed at 11/29/16 1200  Gross per 24 hour  Intake                0 ml  Output             1200 ml  Net            -1200 ml    PHYSICAL EXAMINATION:  GENERAL:  82 y.o.-year-old patient lying in the bed with no acute distress.  EYES: Pupils equal, round, reactive to light and accommodation. No scleral icterus. Extraocular muscles intact.  HEENT: Head atraumatic, normocephalic. Oropharynx and nasopharynx clear.  NECK:  Supple, no jugular venous distention. No thyroid enlargement, no tenderness.  LUNGS: Normal breath sounds bilaterally, no wheezing, rales,rhonchi or crepitation. No use of accessory muscles of respiration.  CARDIOVASCULAR: S1, S2 normal. No murmurs, rubs, or gallops.  ABDOMEN: Soft, non-tender, non-distended. Bowel sounds present. No organomegaly or mass.  EXTREMITIES: No pedal edema, cyanosis, or clubbing.  NEUROLOGIC: Cranial nerves II through XII are intact. Muscle strength 5/5 in all extremities. Sensation intact. Gait not checked.  PSYCHIATRIC: The patient is alert and oriented x 3.  SKIN: No obvious rash, lesion, or ulcer.   DATA REVIEW:   CBC  Recent Labs Lab 11/29/16 0135  WBC 13.2*  HGB 11.5*  HCT 34.0*  PLT 241    Chemistries   Recent Labs Lab 11/28/16 1230 11/29/16 0135  NA 133* 134*  K 4.0 4.0  CL 99* 102  CO2 23 24  GLUCOSE 427* 271*  BUN 33* 30*  CREATININE 1.74* 1.45*  CALCIUM 9.1 8.5*  AST 23  --   ALT 13*  --   ALKPHOS 108  --   BILITOT 0.4  --     Cardiac Enzymes  Recent Labs Lab 11/29/16 0135  TROPONINI <0.03    Microbiology Results  No results found for this or any previous visit.  RADIOLOGY:  Dg Chest 2 View  Result Date: 11/28/2016 CLINICAL DATA:  82 year old female with  chest pain onset this morning. Initial encounter. EXAM: CHEST  2 VIEW COMPARISON:  Chest radiographs 04/16/2014 and earlier. FINDINGS: New medial segment right middle lobe collapse or consolidation. Mediastinal contours appear stable and within normal limits. Sequelae of CABG. Calcified aortic atherosclerosis. No superimposed pneumothorax, pulmonary edema or pleural effusion. No other confluent pulmonary opacity. Stable lung volumes. Osteopenia. No acute osseous abnormality identified. IMPRESSION: 1. New medial segment right middle lobe collapse or consolidation. Top differential considerations are right middle lobe pneumonia versus bronchial obstruction. 2. No pleural effusion or other acute cardiopulmonary abnormality. 3.  Calcified aortic atherosclerosis. Electronically Signed   By: Genevie Ann M.D.   On: 11/28/2016 13:30   Ct Chest Wo Contrast  Result Date: 11/28/2016 CLINICAL DATA:  Chest pain EXAM: CT CHEST WITHOUT CONTRAST TECHNIQUE: Multidetector CT imaging of the chest was performed following the standard protocol without IV contrast. COMPARISON:  Chest x-ray from earlier in the same day FINDINGS: Cardiovascular: Diffuse aortic calcifications are noted without aneurysmal dilatation. Heavy coronary calcifications are seen. Changes of prior coronary bypass grafting are noted. Pulmonary artery as visualized is within normal limits. Cardiac structures are within normal limits. Mediastinum/Nodes: Thoracic inlet is unremarkable. There is a 12 mm short axis lymph node anterior to the carina. No other significant mediastinal or hilar adenopathy is seen. Lungs/Pleura: Left lung is well aerated. A tiny nodule is noted in the upper lobe on image number 22 of series 3. 4 mm nodule is noted lower in the left upper lobe on image number 45 of series 3. The right lung shows consolidation of the right middle lobe although there is apparent attenuation difference in the right middle lobe which measures approximately 4.6 cm  in greatest dimension. This appears to be causing bronchial obstruction with subsequent collapse of the right middle lobe. The bronchial occlusion is best seen on image number 75 of series 2. Upper Abdomen: The upper abdomen demonstrates a partially calcified 1 cm right renal artery aneurysm hypodense lesion is noted in the left kidney which may represent cyst. Musculoskeletal: No acute bony abnormality is noted. IMPRESSION: Changes in the right middle lobe consistent with a central obstructing mass lesion as described with the right middle lobe collapse. Bronchoscopic evaluation is recommended. Tiny nodular densities within the left lung as described. Electronically Signed   By: Inez Catalina M.D.   On: 11/28/2016 14:11    EKG:   Orders placed  or performed during the hospital encounter of 11/28/16  . EKG 12-Lead  . EKG 12-Lead      Management plans discussed with the patient, family and they are in agreement.  CODE STATUS:     Code Status Orders        Start     Ordered   11/28/16 1941  Full code  Continuous     11/28/16 1940    Code Status History    Date Active Date Inactive Code Status Order ID Comments User Context   This patient has a current code status but no historical code status.    Advance Directive Documentation   Flowsheet Row Most Recent Value  Type of Advance Directive  Healthcare Power of Attorney  Pre-existing out of facility DNR order (yellow form or pink MOST form)  No data  "MOST" Form in Place?  No data      TOTAL TIME TAKING CARE OF THIS PATIENT: 40 minutes.    Theodoro Grist M.D on 11/29/2016 at 3:35 PM  Between 7am to 6pm - Pager - [phone]  After 6pm go to [email or web address] - password [email or web address]  Office  [phone]  CC: Primary care physician; [name], MD

## 2016-11-29 NOTE — Progress Notes (Signed)
    Notified by IM that pt admitted with chest pain, normal troponin, and new finding or RML lung mass.  Pt was initially scheduled for stress testing, however she declined and wishes to be discharged.  I met with her briefly.  She is not interested in any additional cardiac testing or w/u of lung mass.  She will be d/c'd today and has f/u with Dr. [name] on 1/23.  She was grateful for our brief visit.  Murray Hodgkins, NP

## 2016-11-29 NOTE — Progress Notes (Signed)
        Advance care planning      Present: patient is Donna Lane, commissioning Dr Ether Griffins  Diagnoses chest pain, acute renal insufficiency, hyponatremia, elevated troponin, right bronchial mass, right middle lobe atelectasis/collapse, coronary artery disease, diabetes, hypertension, hyperlipidemia, CAD stage III, chronic diastolic CHF   I discussed this patient about who she would like to be her spokesman if she was not able to speak for herself, she wanted her daughter , Donna Lane to make decisions for her. I discussed options of lung mass evaluation and therapies, including chemotherapy, radiation therapy, surgical therapy, bronchoscopy, etc., I answered all patient's questions, she refused any of these modalities, any follow-up with physicians, and she did not feel that she had "lung cancer". She also specifically told me that I should not inform her husband or her son about any  medical problems.   Time spent 20 minutes

## 2016-11-29 NOTE — Progress Notes (Signed)
Inpatient Diabetes Program Recommendations  AACE/ADA: New Consensus Statement on Inpatient Glycemic Control (2015)  Target Ranges:  Prepandial:   less than 140 mg/dL      Peak postprandial:   less than 180 mg/dL (1-2 hours)      Critically ill patients:  140 - 180 mg/dL   Results for Donna Lane, Donna Lane (MRN [id number]) as of 11/29/2016 10:07  Ref. Range 11/29/2016 07:47  Glucose-Capillary Latest Ref Range: 65 - 99 mg/dL 118 (H)  Results for Donna Lane, Donna Lane (MRN [id number]) as of 11/29/2016 10:07  Ref. Range 11/28/2016 12:30 11/29/2016 01:35  Glucose Latest Ref Range: 65 - 99 mg/dL 427 (H) 271 (H)  Results for Donna Lane, Donna Lane (MRN [id number]) as of 11/29/2016 10:07  Ref. Range 11/12/2016 08:13  Hemoglobin A1C Latest Ref Range: 4.6 - 6.5 % 7.8 (H)   Review of Glycemic Control  Diabetes history: DM2 Outpatient Diabetes medications: Glipizide XL 5 mg QAM, Levemir 3-5 units QHS as needed  Current orders for Inpatient glycemic control: Glipizide XL 5 mg QAM  Inpatient Diabetes Program Recommendations: Correction (SSI): If remains inpatient, please consider ordering CBGs and Novolog sensitive correction scale ACHS.  Thanks, Barnie Alderman, RN, MSN, CDE Diabetes Coordinator Inpatient Diabetes Program [phone] (Team Pager from 8am to 5pm)

## 2016-11-29 NOTE — Progress Notes (Signed)
Pt to be discharged this afternoon. Iv and tele removed. disch instructions given to pt to her understanding. To be transported by daughter.

## 2016-12-23 ENCOUNTER — Other Ambulatory Visit: Payer: Self-pay | Admitting: Family Medicine

## 2017-01-03 ENCOUNTER — Ambulatory Visit: Payer: Medicare Other | Admitting: Podiatry

## 2017-01-06 ENCOUNTER — Emergency Department: Payer: Medicare Other

## 2017-01-06 ENCOUNTER — Encounter: Payer: Self-pay | Admitting: Emergency Medicine

## 2017-01-06 ENCOUNTER — Inpatient Hospital Stay
Admission: EM | Admit: 2017-01-06 | Discharge: 2017-01-09 | DRG: 308 | Disposition: A | Discharge status: Home / Self Care | Payer: Medicare Other | Attending: Internal Medicine | Admitting: Internal Medicine

## 2017-01-06 DIAGNOSIS — Z87891 Personal history of nicotine dependence: Secondary | ICD-10-CM

## 2017-01-06 DIAGNOSIS — R7989 Other specified abnormal findings of blood chemistry: Secondary | ICD-10-CM

## 2017-01-06 DIAGNOSIS — E118 Type 2 diabetes mellitus with unspecified complications: Secondary | ICD-10-CM | POA: Diagnosis not present

## 2017-01-06 DIAGNOSIS — E1151 Type 2 diabetes mellitus with diabetic peripheral angiopathy without gangrene: Secondary | ICD-10-CM | POA: Diagnosis present

## 2017-01-06 DIAGNOSIS — IMO0002 Reserved for concepts with insufficient information to code with codable children: Secondary | ICD-10-CM | POA: Diagnosis present

## 2017-01-06 DIAGNOSIS — R0602 Shortness of breath: Secondary | ICD-10-CM | POA: Diagnosis not present

## 2017-01-06 DIAGNOSIS — R778 Other specified abnormalities of plasma proteins: Secondary | ICD-10-CM | POA: Diagnosis present

## 2017-01-06 DIAGNOSIS — I25810 Atherosclerosis of coronary artery bypass graft(s) without angina pectoris: Secondary | ICD-10-CM | POA: Diagnosis present

## 2017-01-06 DIAGNOSIS — E119 Type 2 diabetes mellitus without complications: Secondary | ICD-10-CM | POA: Diagnosis not present

## 2017-01-06 DIAGNOSIS — Z794 Long term (current) use of insulin: Secondary | ICD-10-CM | POA: Diagnosis not present

## 2017-01-06 DIAGNOSIS — Z9849 Cataract extraction status, unspecified eye: Secondary | ICD-10-CM

## 2017-01-06 DIAGNOSIS — I25718 Atherosclerosis of autologous vein coronary artery bypass graft(s) with other forms of angina pectoris: Secondary | ICD-10-CM | POA: Diagnosis not present

## 2017-01-06 DIAGNOSIS — I5033 Acute on chronic diastolic (congestive) heart failure: Secondary | ICD-10-CM | POA: Diagnosis present

## 2017-01-06 DIAGNOSIS — R0902 Hypoxemia: Secondary | ICD-10-CM | POA: Diagnosis present

## 2017-01-06 DIAGNOSIS — N183 Chronic kidney disease, stage 3 unspecified: Secondary | ICD-10-CM | POA: Diagnosis present

## 2017-01-06 DIAGNOSIS — Z79899 Other long term (current) drug therapy: Secondary | ICD-10-CM | POA: Diagnosis not present

## 2017-01-06 DIAGNOSIS — E785 Hyperlipidemia, unspecified: Secondary | ICD-10-CM | POA: Diagnosis present

## 2017-01-06 DIAGNOSIS — E1122 Type 2 diabetes mellitus with diabetic chronic kidney disease: Secondary | ICD-10-CM | POA: Diagnosis present

## 2017-01-06 DIAGNOSIS — Z7901 Long term (current) use of anticoagulants: Secondary | ICD-10-CM | POA: Diagnosis not present

## 2017-01-06 DIAGNOSIS — R918 Other nonspecific abnormal finding of lung field: Secondary | ICD-10-CM | POA: Diagnosis not present

## 2017-01-06 DIAGNOSIS — I2510 Atherosclerotic heart disease of native coronary artery without angina pectoris: Secondary | ICD-10-CM | POA: Diagnosis not present

## 2017-01-06 DIAGNOSIS — I272 Pulmonary hypertension, unspecified: Secondary | ICD-10-CM | POA: Diagnosis present

## 2017-01-06 DIAGNOSIS — R0603 Acute respiratory distress: Secondary | ICD-10-CM

## 2017-01-06 DIAGNOSIS — J449 Chronic obstructive pulmonary disease, unspecified: Secondary | ICD-10-CM | POA: Diagnosis not present

## 2017-01-06 DIAGNOSIS — Z951 Presence of aortocoronary bypass graft: Secondary | ICD-10-CM | POA: Diagnosis not present

## 2017-01-06 DIAGNOSIS — I130 Hypertensive heart and chronic kidney disease with heart failure and stage 1 through stage 4 chronic kidney disease, or unspecified chronic kidney disease: Secondary | ICD-10-CM | POA: Diagnosis present

## 2017-01-06 DIAGNOSIS — I701 Atherosclerosis of renal artery: Secondary | ICD-10-CM | POA: Diagnosis present

## 2017-01-06 DIAGNOSIS — I110 Hypertensive heart disease with heart failure: Secondary | ICD-10-CM | POA: Diagnosis not present

## 2017-01-06 DIAGNOSIS — I10 Essential (primary) hypertension: Secondary | ICD-10-CM

## 2017-01-06 DIAGNOSIS — N184 Chronic kidney disease, stage 4 (severe): Secondary | ICD-10-CM | POA: Diagnosis present

## 2017-01-06 DIAGNOSIS — I4891 Unspecified atrial fibrillation: Secondary | ICD-10-CM | POA: Diagnosis present

## 2017-01-06 DIAGNOSIS — E876 Hypokalemia: Secondary | ICD-10-CM | POA: Diagnosis present

## 2017-01-06 DIAGNOSIS — E1165 Type 2 diabetes mellitus with hyperglycemia: Secondary | ICD-10-CM | POA: Diagnosis not present

## 2017-01-06 DIAGNOSIS — Z7982 Long term (current) use of aspirin: Secondary | ICD-10-CM | POA: Diagnosis not present

## 2017-01-06 HISTORY — DX: Chronic kidney disease, stage 3 (moderate): N18.3

## 2017-01-06 HISTORY — DX: Chronic kidney disease, stage 3 unspecified: N18.30

## 2017-01-06 LAB — BASIC METABOLIC PANEL
Anion gap: 11 (ref 5–15)
BUN: 21 mg/dL — AB (ref 6–20)
CALCIUM: 8.7 mg/dL — AB (ref 8.9–10.3)
CO2: 24 mmol/L (ref 22–32)
CREATININE: 1.42 mg/dL — AB (ref 0.44–1.00)
Chloride: 100 mmol/L — ABNORMAL LOW (ref 101–111)
GFR calc non Af Amer: 32 mL/min — ABNORMAL LOW (ref >60)
GFR, EST AFRICAN AMERICAN: 37 mL/min — AB (ref >60)
Glucose, Bld: 183 mg/dL — ABNORMAL HIGH (ref 65–99)
Potassium: 3.4 mmol/L — ABNORMAL LOW (ref 3.5–5.1)
SODIUM: 135 mmol/L (ref 135–145)

## 2017-01-06 LAB — CBC
HCT: 34.3 % — ABNORMAL LOW (ref 35.0–47.0)
Hemoglobin: 11.6 g/dL — ABNORMAL LOW (ref 12.0–16.0)
MCH: 29.7 pg (ref 26.0–34.0)
MCHC: 33.8 g/dL (ref 32.0–36.0)
MCV: 87.9 fL (ref 80.0–100.0)
PLATELETS: 232 10*3/uL (ref 150–440)
RBC: 3.9 MIL/uL (ref 3.80–5.20)
RDW: 13.4 % (ref 11.5–14.5)
WBC: 13.3 10*3/uL — AB (ref 3.6–11.0)

## 2017-01-06 LAB — GLUCOSE, CAPILLARY
GLUCOSE-CAPILLARY: 175 mg/dL — AB (ref 65–99)
GLUCOSE-CAPILLARY: 194 mg/dL — AB (ref 65–99)
GLUCOSE-CAPILLARY: 157 mg/dL — AB (ref 65–99)

## 2017-01-06 LAB — APTT: APTT: 30 s (ref 24–36)

## 2017-01-06 LAB — TSH: TSH: 1.195 u[IU]/mL (ref 0.350–4.500)

## 2017-01-06 LAB — PROTIME-INR
INR: 1.07
PROTHROMBIN TIME: 13.9 s (ref 11.4–15.2)

## 2017-01-06 LAB — TROPONIN I: Troponin I: 0.03 ng/mL (ref <0.03)

## 2017-01-06 LAB — MAGNESIUM: MAGNESIUM: 2 mg/dL (ref 1.7–2.4)

## 2017-01-06 LAB — MRSA PCR SCREENING: MRSA by PCR: NEGATIVE

## 2017-01-06 LAB — BRAIN NATRIURETIC PEPTIDE: B NATRIURETIC PEPTIDE 5: 362 pg/mL — AB (ref 0.0–100.0)

## 2017-01-06 MED ORDER — DILTIAZEM HCL 30 MG PO TABS
30.0000 mg | ORAL_TABLET | Freq: Four times a day (QID) | ORAL | Status: DC
  Administered 2017-01-06 – 2017-01-07 (×2): 30 mg via ORAL
  Filled 2017-01-06 (×2): qty 1

## 2017-01-06 MED ORDER — LISINOPRIL 2.5 MG PO TABS
1.2500 mg | ORAL_TABLET | Freq: Every day | ORAL | Status: DC
  Administered 2017-01-07: 1.25 mg via ORAL
  Filled 2017-01-06 (×2): qty 0.5

## 2017-01-06 MED ORDER — TRAZODONE HCL 50 MG PO TABS
25.0000 mg | ORAL_TABLET | Freq: Every evening | ORAL | Status: DC | PRN
  Administered 2017-01-06 – 2017-01-07 (×2): 25 mg via ORAL
  Filled 2017-01-06 (×2): qty 1

## 2017-01-06 MED ORDER — ONDANSETRON HCL 4 MG PO TABS: 4.0000 mg | ORAL_TABLET | Freq: Four times a day (QID) | ORAL | Status: DC | PRN

## 2017-01-06 MED ORDER — ASPIRIN 81 MG PO CHEW
324.0000 mg | CHEWABLE_TABLET | Freq: Once | ORAL | Status: AC
  Administered 2017-01-06: 324 mg via ORAL
  Filled 2017-01-06: qty 4

## 2017-01-06 MED ORDER — INSULIN ASPART 100 UNIT/ML ~~LOC~~ SOLN
0.0000 [IU] | Freq: Three times a day (TID) | SUBCUTANEOUS | Status: DC
  Administered 2017-01-06: 2 [IU] via SUBCUTANEOUS
  Administered 2017-01-07: 1 [IU] via SUBCUTANEOUS
  Administered 2017-01-07: 3 [IU] via SUBCUTANEOUS
  Administered 2017-01-07: 2 [IU] via SUBCUTANEOUS
  Administered 2017-01-08: 5 [IU] via SUBCUTANEOUS
  Administered 2017-01-08: 18:00:00 2 [IU] via SUBCUTANEOUS
  Administered 2017-01-08: 1 [IU] via SUBCUTANEOUS
  Administered 2017-01-09: 13:00:00 2 [IU] via SUBCUTANEOUS
  Administered 2017-01-09: 08:00:00 1 [IU] via SUBCUTANEOUS
  Filled 2017-01-06: qty 2
  Filled 2017-01-06 (×2): qty 1
  Filled 2017-01-06: qty 3
  Filled 2017-01-06: qty 2
  Filled 2017-01-06: qty 5
  Filled 2017-01-06: qty 1
  Filled 2017-01-06 (×2): qty 2

## 2017-01-06 MED ORDER — ACETAMINOPHEN 325 MG PO TABS: 650.0000 mg | ORAL_TABLET | Freq: Four times a day (QID) | ORAL | Status: DC | PRN

## 2017-01-06 MED ORDER — ACETAMINOPHEN 650 MG RE SUPP: 650.0000 mg | Freq: Four times a day (QID) | RECTAL | Status: DC | PRN

## 2017-01-06 MED ORDER — SIMVASTATIN 20 MG PO TABS: 20.0000 mg | ORAL_TABLET | Freq: Every day | ORAL | Status: DC

## 2017-01-06 MED ORDER — ATORVASTATIN CALCIUM 20 MG PO TABS
10.0000 mg | ORAL_TABLET | Freq: Every day | ORAL | Status: DC
  Administered 2017-01-06 – 2017-01-08 (×3): 10 mg via ORAL
  Filled 2017-01-06 (×3): qty 1

## 2017-01-06 MED ORDER — FUROSEMIDE 10 MG/ML IJ SOLN
20.0000 mg | Freq: Two times a day (BID) | INTRAMUSCULAR | Status: DC
  Administered 2017-01-06 – 2017-01-08 (×4): 20 mg via INTRAVENOUS
  Filled 2017-01-06 (×4): qty 2

## 2017-01-06 MED ORDER — ONDANSETRON HCL 4 MG/2ML IJ SOLN: 4.0000 mg | Freq: Four times a day (QID) | INTRAMUSCULAR | Status: DC | PRN

## 2017-01-06 MED ORDER — DOCUSATE SODIUM 100 MG PO CAPS
100.0000 mg | ORAL_CAPSULE | Freq: Two times a day (BID) | ORAL | Status: DC
  Administered 2017-01-07 – 2017-01-09 (×4): 100 mg via ORAL
  Filled 2017-01-06 (×4): qty 1

## 2017-01-06 MED ORDER — BISACODYL 5 MG PO TBEC: 5.0000 mg | DELAYED_RELEASE_TABLET | Freq: Every day | ORAL | Status: DC | PRN

## 2017-01-06 MED ORDER — NITROGLYCERIN 0.4 MG SL SUBL: 0.4000 mg | SUBLINGUAL_TABLET | SUBLINGUAL | Status: DC | PRN

## 2017-01-06 MED ORDER — POTASSIUM CHLORIDE CRYS ER 20 MEQ PO TBCR
40.0000 meq | EXTENDED_RELEASE_TABLET | ORAL | Status: AC
  Administered 2017-01-06 (×2): 40 meq via ORAL
  Filled 2017-01-06 (×2): qty 2

## 2017-01-06 MED ORDER — HEPARIN BOLUS VIA INFUSION
3500.0000 [IU] | Freq: Once | INTRAVENOUS | Status: AC
  Administered 2017-01-06: 3500 [IU] via INTRAVENOUS
  Filled 2017-01-06: qty 3500

## 2017-01-06 MED ORDER — ISOSORBIDE MONONITRATE ER 60 MG PO TB24: 30.0000 mg | ORAL_TABLET | Freq: Every day | ORAL | Status: DC

## 2017-01-06 MED ORDER — ASPIRIN EC 81 MG PO TBEC
81.0000 mg | DELAYED_RELEASE_TABLET | Freq: Every day | ORAL | Status: DC
  Administered 2017-01-07 – 2017-01-09 (×3): 81 mg via ORAL
  Filled 2017-01-06 (×3): qty 1

## 2017-01-06 MED ORDER — POTASSIUM CHLORIDE CRYS ER 20 MEQ PO TBCR: 40.0000 meq | EXTENDED_RELEASE_TABLET | ORAL | Status: DC

## 2017-01-06 MED ORDER — DIGOXIN 0.25 MG/ML IJ SOLN
0.2500 mg | Freq: Once | INTRAMUSCULAR | Status: AC
Start: 2017-01-06 — End: 2017-01-06
  Administered 2017-01-06: 0.25 mg via INTRAVENOUS
  Filled 2017-01-06: qty 2

## 2017-01-06 MED ORDER — HEPARIN (PORCINE) IN NACL 100-0.45 UNIT/ML-% IJ SOLN
850.0000 [IU]/h | INTRAMUSCULAR | Status: DC
  Administered 2017-01-06 – 2017-01-07 (×2): 850 [IU]/h via INTRAVENOUS
  Filled 2017-01-06 (×3): qty 250

## 2017-01-06 MED ORDER — DEXTROSE 5 % IV SOLN
5.0000 mg/h | INTRAVENOUS | Status: DC
  Administered 2017-01-06: 5 mg/h via INTRAVENOUS
  Filled 2017-01-06 (×2): qty 100

## 2017-01-06 MED ORDER — ACETAMINOPHEN 500 MG PO TABS
500.0000 mg | ORAL_TABLET | Freq: Every day | ORAL | Status: DC
  Administered 2017-01-07 – 2017-01-09 (×3): 500 mg via ORAL
  Filled 2017-01-06 (×3): qty 1

## 2017-01-06 MED ORDER — DILTIAZEM HCL 25 MG/5ML IV SOLN
10.0000 mg | Freq: Once | INTRAVENOUS | Status: AC
  Administered 2017-01-06: 10 mg via INTRAVENOUS
  Filled 2017-01-06: qty 5

## 2017-01-06 NOTE — H&P (Signed)
[facility] [place] NAME: Donna Lane    MR#:  [id number]  DATE OF BIRTH:  [birth year]  DATE OF ADMISSION:  01/06/2017  PRIMARY CARE PHYSICIAN: [name], MD   REQUESTING/REFERRING PHYSICIAN: Dr. Mariea Clonts  CHIEF COMPLAINT: Shortness of breath    Chief Complaint  Patient presents with  . Shortness of Breath    HISTORY OF PRESENT ILLNESS:  Donna Lane  is a 83 y.o. female with a known history of Coronary artery disease, severe 3 vessel disease with bypass surgery, history of peripheral vascular disease, bilateral renal artery stenosis, carotid disease who follows up with [facility] cardiology comes in because of shortness of breath, orthopnea, PND, pedal edema for last 3 weeks associated with chest heaviness. Daughter wanted her to come on the weekend but today she could not breathe decided to come to emergency room. Found to have atrial fibrillation with RVR heart rate up to 1 40 bpm, patient received 10 mg of IV Cardizem by yet physician but heart rate is still in 130s, and she is in atrial fibrillation. Patient able to do full sentences. She is diagnosed to have lung mass by CT chest in December but did not pursue any treatment or workup for that because she did not want to hear the word cancer. Explained to her that the patient needs bronchoscopy/biopsy  If she wants  once her heart gets stable  PAST MEDICAL HISTORY:   Past Medical History:  Diagnosis Date  . CHF (congestive heart failure) ([place])   . Chronic diastolic heart failure (HCC)    with pulmonary HTN  . Chronic kidney disease   . COPD (chronic obstructive pulmonary disease) ([place])   . Coronary artery disease 2008   CABG x 4,   . Diabetes mellitus   . Diverticulosis    on colonoscopy 08/12/2006  . Hyperlipidemia   . Hypertension   . Melanoma ([place])    removed by derm   . PVD (peripheral vascular disease) ([place])   . Spinal stenosis     PAST SURGICAL  HISTOIRY:   Past Surgical History:  Procedure Laterality Date  . CARDIAC CATHETERIZATION     PTCA x 3  . CATARACT EXTRACTION    . CORONARY ARTERY BYPASS GRAFT  2008   x 4  . HIP FRACTURE SURGERY Left    pins placed w/o hip replacement, hardware removed later  . LUMBAR DISC SURGERY     in her 64s  . tonsillectomy    . VAGINAL DELIVERY     x2    SOCIAL HISTORY:   Social History  Substance Use Topics  . Smoking status: Former Smoker    Packs/day: 1.00    Years: 50.00    Types: Cigarettes    Quit date: 03/20/2007  . Smokeless tobacco: Never Used  . Alcohol use No    FAMILY HISTORY:   Family History  Problem Relation Age of Onset  . Heart disease Father   . Heart disease Sister   . Diabetes Sister     DRUG ALLERGIES:   Allergies  Allergen Reactions  . Nsaids Other (See Comments)    Would avoid due to CKD.... Pt states she not allergic    REVIEW OF SYSTEMS:  CONSTITUTIONAL: No fever, fatigue or weakness.  EYES: No blurred or double vision.  EARS, NOSE, AND THROAT: No tinnitus or ear pain.  RESPIRATORY: Cough, shortness of breath, pedal edema.Marland Kitchen  CARDIOVASCULAR: No chest pain, orthopnea, edema.  GASTROINTESTINAL: No nausea, vomiting, diarrhea or abdominal pain.  GENITOURINARY: No dysuria, hematuria.  ENDOCRINE: No polyuria, nocturia,  HEMATOLOGY: No anemia, easy bruising or bleeding SKIN: No rash or lesion. MUSCULOSKELETAL: No joint pain or arthritis.   NEUROLOGIC: No tingling, numbness, weakness.  PSYCHIATRY: No anxiety or depression.   MEDICATIONS AT HOME:   Prior to Admission medications   Medication Sig Start Date End Date Taking? Authorizing Provider  acetaminophen (TYLENOL) 500 MG tablet Take 500 mg by mouth daily.    Yes Historical Provider, MD  aspirin EC 81 MG tablet Take by mouth daily.    Yes Historical Provider, MD  furosemide (LASIX) 40 MG tablet TAKE ONE TABLET BY MOUTH TWICE DAILY 12/23/16  Yes Tonia Ghent, MD  glipiZIDE (GLUCOTROL XL) 5  MG 24 hr tablet TAKE ONE TABLET BY MOUTH IN THE MORNING 11/05/16  Yes Tonia Ghent, MD  hydrALAZINE (APRESOLINE) 25 MG tablet TAKE ONE TABLET BY MOUTH TWICE DAILY 11/25/16  Yes Tonia Ghent, MD  lisinopril (ZESTRIL) 2.5 MG tablet Take 0.5 tablets (1.25 mg total) by mouth daily. 11/14/16  Yes Tonia Ghent, MD  nitroGLYCERIN (NITROSTAT) 0.4 MG SL tablet Place 0.4 mg under the tongue every 5 (five) minutes as needed for chest pain.    Yes Historical Provider, MD  simvastatin (ZOCOR) 20 MG tablet TAKE ONE TABLET BY MOUTH ONCE DAILY 10/08/16  Yes Minna Merritts, MD  Insulin Detemir (LEVEMIR) 100 UNIT/ML Pen Inject 3-5 Units into the skin at bedtime as needed (for hyperglycemia). 02/29/16   Tonia Ghent, MD  isosorbide mononitrate (IMDUR) 60 MG 24 hr tablet TAKE ONE-HALF TABLET BY MOUTH ONCE DAILY Patient not taking: Reported on 11/28/2016 10/23/16   Minna Merritts, MD      VITAL SIGNS:  Blood pressure 114/62, pulse (!) 107, temperature 98 F (36.7 C), temperature source Oral, resp. rate 18, height 5' 2.5" (1.588 m), weight 57.6 kg (127 lb), SpO2 96 %.  PHYSICAL EXAMINATION:  GENERAL:  83 y.o.-year-old patient lying in the bed with no acute distress.  EYES: Pupils equal, round, reactive to light and accommodation. No scleral icterus. Extraocular muscles intact.  HEENT: Head atraumatic, normocephalic. Oropharynx and nasopharynx clear.  NECK:  Supple, no jugular venous distention. No thyroid enlargement, no tenderness.  LUNGS: Normal breath sounds bilaterally, no wheezing, basilar creps.No use of accessory muscles of respiration.  CARDIOVASCULAR: S1, S2 irregularly irregular.. No murmurs, rubs, or gallops.  ABDOMEN: Soft, nontender, nondistended. Bowel sounds present. No organomegaly or mass.  EXTREMITIES: bilateral  LE edema NEUROLOGIC: Cranial nerves II through XII are intact. Muscle strength 5/5 in all extremities. Sensation intact. Gait not checked.  PSYCHIATRIC: The patient is  alert and oriented x 3.  SKIN: No obvious rash, lesion, or ulcer.   LABORATORY PANEL:   CBC  Recent Labs Lab 01/06/17 1044  WBC 13.3*  HGB 11.6*  HCT 34.3*  PLT 232   ------------------------------------------------------------------------------------------------------------------  Chemistries   Recent Labs Lab 01/06/17 1044  NA 135  K 3.4*  CL 100*  CO2 24  GLUCOSE 183*  BUN 21*  CREATININE 1.42*  CALCIUM 8.7*   ------------------------------------------------------------------------------------------------------------------  Cardiac Enzymes  Recent Labs Lab 01/06/17 1044  TROPONINI 0.03*   ------------------------------------------------------------------------------------------------------------------  RADIOLOGY:  Dg Chest 2 View  Result Date: 01/06/2017 CLINICAL DATA:  Shortness of breath. EXAM: CHEST  2 VIEW COMPARISON:  CT scan and radiograph of November 28, 2016. FINDINGS: Atherosclerosis of thoracic aorta is noted. Status post coronary artery bypass graft. No  pneumothorax is noted. Large rounded density is seen along right cardiac border concerning for neoplasm or malignancy. Minimal bilateral pleural effusions are noted. Bony thorax is unremarkable. IMPRESSION: Large rounded density is noted along right cardiac border concerning for neoplasm or malignancy. CT scan of the chest is recommended for further evaluation. Electronically Signed   By: Marijo Conception, M.D.   On: 01/06/2017 11:35    EKG:   Orders placed or performed during the hospital encounter of 01/06/17  . ED EKG within 10 minutes  . ED EKG within 10 minutes    IMPRESSION AND PLAN:  #1 new onset atrial fibrillation with RVR; heart rate up to 1 30 bpm even with Cardizem 10 mg IV push. Admitted to telemetry, start Cardizem drip, IV heparin drip, cardiology consult with [facility] cardiology. #2. Acute on chronic diastolic heart failure due to atrial fibrillation: BNP slightly high at 362,  start IV Lasix, check echocardiogram, check daily weights, appreciate cardiology following  #3 possible right lung cancer(:heavy smoker 50 pack yrs) Patient did not want to have any workup when she had initial CAT scan in December. Discussed the findings with patient's daughter and also the patient, if she wishes to proceed and have bronchoscopy and biopsy,   #4 history of coronary artery disease, 4 vessel bypass before, follows up [facility]ology Dr. [name]  5. Chronic kidney disease stage [id number] 6.Continued stress at home taking care of her husband who has persistent dysphagia since carotid surgery October 2016 . She is giving food by a feeding tube  7.Marland KitchenDMII;.resume home meds 8. peripheral vascular disease: Continue statins.  All the records are reviewed and case discussed with ED provider. Management plans discussed with the patient, family and they are in agreement.  CODE STATUS: full(but she told me that she does not want to be  Alive by artifical means)will get chaplain to clarify  TOTAL TIME TAKING CARE OF THIS PATIENT: 55 minutes.    Epifanio Lesches M.D on 01/06/2017 at 1:22 PM  Between 7am to 6pm - Pager - [phone]  After 6pm go to [email or web address] - password [email or web address] [facility]  Office  [phone]  CC: Primary care physician; [name], MD  Note: This dictation was prepared with Dragon dictation along with smaller phrase technology. Any transcriptional errors that result from this process are unintentional.

## 2017-01-06 NOTE — Progress Notes (Addendum)
Donna Faith, PA gave verbal order to decrease patient's cardizem gtt to 5 mg/hr and if HR continues to stay in 90's low 100's give PO and stop gtt 1 hour after. After decreasing gtt to 5 mg/hr HR increased to low 110's and 120's. Dr. [name] on unit and ordered for digoxin IV to be given and hold PO cardizem at this time. Per Dr. [name] may give cardizem PO if BP stable later tonight. [name]

## 2017-01-06 NOTE — ED Notes (Signed)
ED Provider at bedside. 

## 2017-01-06 NOTE — ED Notes (Signed)
Patient transported to X-ray 

## 2017-01-06 NOTE — Progress Notes (Signed)
Per Dr. Vianne Bulls, start patient on sensitive sliding scale insulin. [name]

## 2017-01-06 NOTE — ED Triage Notes (Signed)
Patient presents to the ED via [facility].  Patient's heart rate is in the 140s-160s.  Patient denies history of irregular heart beat but reports history of multiple cardiac bypasses.  Patient reports feeling short of breath the past few days along with swelling to her feet.  Patient reports decreased urination and states, "I'm sure it's my kidneys."  Patient reports epigastric pain and swelling to abdomen.

## 2017-01-06 NOTE — Consult Note (Addendum)
Cardiology Consultation Note  Patient ID: Donna Lane, MRN: [id number], DOB/AGE: Jun 03, 1925 83 y.o. Admit date: 01/06/2017   Date of Consult: 01/06/2017 Primary Physician: [name], MD Primary Cardiologist: Dr. [name], MD Requesting Physician: Dr. Vianne Bulls, MD  Chief Complaint: SOB Reason for Consult: New onset Afib with RVR  HPI: 83 y.o. female with h/o CAD s/p CABG in 9357, chronic diastolic CHF, recently diagnosed RML lung mass, COPD 2/2 tobacco abuse of 50 pack years, PVD, bilateral renal artery stenosis, CKD stage IV, carotid artery disease, DM, HTN, and HLD who presents with increased SOB x 3 weeks, found to be in new onset Afib.   Patient was recently admitted to [facility] in mid December for chest pain. Found to have a new RML lung mass. She was initially scheduled for nuclear stress testing by IM, though she declined this as well as any workup or treatment for her lung mass and she was discharged home with outpatient follow up. She was last seen by Dr. [name] on 07/11/2016. At that time she was doing well, though under increased stress at home. She also noted nocturnal palpitations. Most recent ischemic evaluation via nuclear stress test in 06/2012 was low risk. Echo in 2015in the setting of AECOPD and diastolic HF showed EF of 01-77%, DD, mildly dilated left atrium, mild to moderate mitral regurgitation, mild tricuspid regurgitation, normal RV size and systolic function, moderate sized left pleural effusion.   She presented to [facility] on 01/06/17 with increased SOB, orthopnea, and pedal edema x 3 weeks with associated chest heaviness at rest. She is under a lot of stress at home with her husband's health. She was noted to be in new onset Afib with RVR in the ED with heart rates in the 140's bpm. She was given IV Cardizem 10 mg with heart rates remaining tachycardic in the 130's bpm. She was started on a Cardizem gtt by IM with heart rates improving to the 1-teens bpm. BNP was noted to be  mildly elevated at 362, she was started on IV Lasix. First troponin of 0.03. WBC 13.2, HGB 11.6, PLT 232, SCr 1.42, K+ 3.4. Echo is pending.   Past Medical History:  Diagnosis Date  . Chronic diastolic heart failure ([place])   . CKD (chronic kidney disease), stage III   . COPD (chronic obstructive pulmonary disease) ([place])   . Coronary artery disease 2008   CABG x 4,   . Diabetes mellitus   . Diverticulosis    on colonoscopy 08/12/2006  . Hyperlipidemia   . Hypertension   . Melanoma ([place])    removed by derm   . PVD (peripheral vascular disease) ([place])   . Spinal stenosis       Most Recent Cardiac Studies: As above   Surgical History:  Past Surgical History:  Procedure Laterality Date  . CARDIAC CATHETERIZATION     PTCA x 3  . CATARACT EXTRACTION    . CORONARY ARTERY BYPASS GRAFT  2008   x 4  . HIP FRACTURE SURGERY Left    pins placed w/o hip replacement, hardware removed later  . LUMBAR DISC SURGERY     in her 31s  . tonsillectomy    . VAGINAL DELIVERY     x2     Home Meds: Prior to Admission medications   Medication Sig Start Date End Date Taking? Authorizing Provider  acetaminophen (TYLENOL) 500 MG tablet Take 500 mg by mouth daily.    Yes Historical Provider, MD  aspirin EC 81 MG  tablet Take by mouth daily.    Yes Historical Provider, MD  furosemide (LASIX) 40 MG tablet TAKE ONE TABLET BY MOUTH TWICE DAILY 12/23/16  Yes Tonia Ghent, MD  glipiZIDE (GLUCOTROL XL) 5 MG 24 hr tablet TAKE ONE TABLET BY MOUTH IN THE MORNING 11/05/16  Yes Tonia Ghent, MD  hydrALAZINE (APRESOLINE) 25 MG tablet TAKE ONE TABLET BY MOUTH TWICE DAILY 11/25/16  Yes Tonia Ghent, MD  lisinopril (ZESTRIL) 2.5 MG tablet Take 0.5 tablets (1.25 mg total) by mouth daily. 11/14/16  Yes Tonia Ghent, MD  nitroGLYCERIN (NITROSTAT) 0.4 MG SL tablet Place 0.4 mg under the tongue every 5 (five) minutes as needed for chest pain.    Yes Historical Provider, MD  simvastatin (ZOCOR) 20 MG tablet TAKE  ONE TABLET BY MOUTH ONCE DAILY 10/08/16  Yes Minna Merritts, MD  Insulin Detemir (LEVEMIR) 100 UNIT/ML Pen Inject 3-5 Units into the skin at bedtime as needed (for hyperglycemia). 02/29/16   Tonia Ghent, MD  isosorbide mononitrate (IMDUR) 60 MG 24 hr tablet TAKE ONE-HALF TABLET BY MOUTH ONCE DAILY Patient not taking: Reported on 11/28/2016 10/23/16   Minna Merritts, MD    Inpatient Medications:  . acetaminophen  500 mg Oral Daily  . aspirin EC  81 mg Oral Daily  . docusate sodium  100 mg Oral BID  . furosemide  20 mg Intravenous BID  . heparin  3,500 Units Intravenous Once  . insulin aspart  0-9 Units Subcutaneous TID WC  . isosorbide mononitrate  30 mg Oral Daily  . [START ON 01/07/2017] lisinopril  1.25 mg Oral Daily  . potassium chloride  40 mEq Oral Q4H  . [START ON 01/07/2017] simvastatin  20 mg Oral Daily   . diltiazem (CARDIZEM) infusion 10 mg/hr (01/06/17 1545)  . heparin      Allergies:  Allergies  Allergen Reactions  . Nsaids Other (See Comments)    Would avoid due to CKD.... Pt states she not allergic    Social History   Social History  . Marital status: Married    Spouse name: N/A  . Number of children: N/A  . Years of education: N/A   Occupational History  . Not on file.   Social History Main Topics  . Smoking status: Former Smoker    Packs/day: 1.00    Years: 50.00    Types: Cigarettes    Quit date: 03/20/2007  . Smokeless tobacco: Never Used  . Alcohol use No  . Drug use: No  . Sexual activity: Not on file   Other Topics Concern  . Not on file   Social History Narrative   [facility] [place] [place]    Married 1953   2 kids, local.    Retired.      Family History  Problem Relation Age of Onset  . Heart disease Father   . Heart disease Sister   . Diabetes Sister      Review of Systems: Review of Systems  Constitutional: Positive for malaise/fatigue. Negative for chills, diaphoresis, fever and weight loss.  HENT: Negative for congestion.     Eyes: Negative for discharge and redness.  Respiratory: Positive for cough and shortness of breath. Negative for hemoptysis, sputum production and wheezing.   Cardiovascular: Positive for palpitations and orthopnea. Negative for chest pain, claudication, leg swelling and PND.  Gastrointestinal: Negative for abdominal pain, blood in stool, heartburn, melena, nausea and vomiting.  Genitourinary: Negative for hematuria.  Musculoskeletal: Negative for falls  and myalgias.  Skin: Negative for rash.  Neurological: Positive for weakness. Negative for dizziness, tingling, tremors, sensory change, speech change, focal weakness, loss of consciousness and headaches.  Endo/Heme/Allergies: Does not bruise/bleed easily.  Psychiatric/Behavioral: Negative for substance abuse. The patient is not nervous/anxious.   All other systems reviewed and are negative.   Labs:  Recent Labs  01/06/17 1044  TROPONINI 0.03*   Lab Results  Component Value Date   WBC 13.3 (H) 01/06/2017   HGB 11.6 (L) 01/06/2017   HCT 34.3 (L) 01/06/2017   MCV 87.9 01/06/2017   PLT 232 01/06/2017     Recent Labs Lab 01/06/17 1044  NA 135  K 3.4*  CL 100*  CO2 24  BUN 21*  CREATININE 1.42*  CALCIUM 8.7*  GLUCOSE 183*   Lab Results  Component Value Date   CHOL 147 11/12/2016   HDL 40.60 11/12/2016   LDLCALC 77 11/12/2016   TRIG 149.0 11/12/2016   No results found for: DDIMER  Radiology/Studies:  Dg Chest 2 View  Result Date: 01/06/2017 CLINICAL DATA:  Shortness of breath. EXAM: CHEST  2 VIEW COMPARISON:  CT scan and radiograph of November 28, 2016. FINDINGS: Atherosclerosis of thoracic aorta is noted. Status post coronary artery bypass graft. No pneumothorax is noted. Large rounded density is seen along right cardiac border concerning for neoplasm or malignancy. Minimal bilateral pleural effusions are noted. Bony thorax is unremarkable. IMPRESSION: Large rounded density is noted along right cardiac border  concerning for neoplasm or malignancy. CT scan of the chest is recommended for further evaluation. Electronically Signed   By: Marijo Conception, M.D.   On: 01/06/2017 11:35    EKG: Interpreted by me showed: Afib with RVR, 144 bpm, nonspecific st/t changes Telemetry: Interpreted by me showed: Afib with RVR, 1-teens to 140's bpm  Weights: Filed Weights   01/06/17 1042 01/06/17 1543  Weight: 127 lb (57.6 kg) 131 lb 13.4 oz (59.8 kg)     Physical Exam: Blood pressure 113/80, pulse (!) 122, temperature 97.6 F (36.4 C), temperature source Oral, resp. rate (!) 30, height '5\' 2"'$  (1.575 m), weight 131 lb 13.4 oz (59.8 kg), SpO2 95 %. Body mass index is 24.11 kg/m. General: Frail appearing, in no acute distress. Head: Normocephalic, atraumatic, sclera non-icteric, no xanthomas, nares are without discharge.  Neck: Negative for carotid bruits. JVD not elevated. Lungs: Bilateral expiratory wheezing. Breathing is unlabored. Heart: Irregularly irregular with S1 S2. No murmurs, rubs, or gallops appreciated. Abdomen: Soft, non-tender, non-distended with normoactive bowel sounds. No hepatomegaly. No rebound/guarding. No obvious abdominal masses. Msk:  Strength and tone appear normal for age. Extremities: No clubbing or cyanosis. No edema. Distal pedal pulses are 2+ and equal bilaterally. Neuro: Alert and oriented X 3. No facial asymmetry. No focal deficit. Moves all extremities spontaneously. Psych:  Responds to questions appropriately with a normal affect.    Assessment and Plan:  Principal Problem:   Acute respiratory distress Active Problems:   CORONARY ATHEROSLERO AUTOL VEIN BYPASS GRAFT   Chronic kidney disease, stage 3   Lung mass   New onset a-fib (HCC)   COPD (chronic obstructive pulmonary disease) (HCC)   Diabetes mellitus type 2, uncontrolled, with complications (HCC)   Essential hypertension   Hypokalemia   Hyperlipidemia    1. Acute respiratory distress with hypoxia: -Likely  multifactorial including new onset Afib with RVR, RML lung mass, COPD, and acute on chronic diastolic CHF -Wean oxygen as able  2. New onset Afib with RVR: -Heart  rate slowly improving on Cardizem gtt -Currently at 10 mg/hr -Will add PO short-acting diltiazem for added rate control with hold parameters -If needed could use low-dose digoxin for rate control vs IV amiodarone for rate control  -Upon completion of my note she was noted to have a heart rate in 70's to 80's bpm, still in Afib -Will decrease Cardizem gtt to 5 mg/hr, if after one hour at that rate she remains rate controlled can discontinue Cardizem gtt and continue PO short-acting diltiazem 30 mg q 6 hours -Likely in the setting of the above -Uncertain exactly how long she has been in this rhythm, appears she was in sinus rhythm with MAT in mid December 2017 -She is now more open to possible bronchoscopy with biopsy for her RML lung mass, timing of this is currently uncertain given the above -Given she is more open to bronchoscopy, will continue with heparin gtt that was ordered at time of admission for full-dose anticoagulation -If she decides she does not want a bronchoscopy or if this is planned for outpatient would transition to [place] with Eliquis or Xarelto  -CHADS2VASc at least 7 (CHF, HTN, age x 2, DM, vascular disease, female) -Check TSH  3. RML lung mass: -As above -Will need heart rate to be better controlled prior to possible procedure -Per IM  4. Chest heaviness/CAD s/p 4 vessel CABG in 2008: -Troponin negative  -ASA, Imdur, lisinopril -Change simvastatin to Lipitor given need to titrate Cardizem as above -Check echo -Check lipid and A1C for further risk stratification   5. Acute on chronic diastolic CHF: -Cannot rule out possible reduced EF at this time, consider tachy-mediated vs ischemic  -IV Lasix 20 mg bid with KCl repletion -Not on beta blocker at this time given breathing, start when able  6. CKD stage  III: -Stable -Monitor with diuresis  7. Hypokalemia: -Replete to goal of 4.0 -Check magnesium  8. HTN: -Stable  9. HLD: -Change simvastatin to Lipitor as above  10. DM2: -Per IM   Signed, Christell Faith, PA-C [place] Pager: [phone] 01/06/2017, 4:27 PM   Attending Note Patient seen and examined, agree with detailed note above,  Patient presentation and plan discussed on rounds.  Cardiology consult placed by Dr.  Sissy Hoff for new onset atrial fibrillation with RVR  EKG lab work, chest x-ray  reviewed independently by myself  Donna Lane is a pleasant 83 year old woman with history of coronary artery disease, catheterization August 25, 2007 showing severe three-vessel disease, with bypass surgery at [facility] September 2008,  severe peripheral vascular disease, bilateral renal artery stenosis, carotid disease, iliac disease who presents for worsening shortness of breath for the past 3 weeks, found to be in atrial fibrillation with RVR  Please see details above In brief, worsening leg edema, shortness of breath per the patient 3 weeks She has been putting off right middle lobe lung mass, did not want workup But now that his shortness of her symptoms are getting worse, she is open to evaluation Concerned about taking care of her elderly husband Previously noted fluttering at nighttime in her chest July 2017 as well as on other clinic visits, declined workup and did not want medication, felt it was from stress She does report tachycardia recently at nighttime Occasional chest pressure  In the emergency room she was started on Cardizem IV bolus, then infusion with improvement of heart rate, also given Lasix IV  In the ICU this evening she is on Cardizem drip at 5  mg/h, previously on 10 mg but blood pressures running low, heart rate down to 70, decreased back to 5 mg/h  Other past medical history reviewed arthritis pill was held for creatinine more  than 2. History of spinal stenosis, seen by Dr. Trenton Gammon, surgery was not indicated, she had cortisone shots 2, second shot affected a nerve.  Prior pain in the subxiphoid region with occasional nausea and vomiting of green bilious liquid. She attributes her discomfort to taking meloxicam which was initiated for possible gout.   hospital admission 04/17/2014 to 04/19/2014 with COPD exacerbation, acute on chronic diastolic CHF She was given IV Lasix with worsening renal dysfunction also given IV steroids, nebulizers with good improvement of her shortness of breath Baseline creatinine before diuresis was 1.25. After diuresis was 1.96  Carotid ultrasound in the November 2012 has shown 40-59% bilateral carotid disease.  LE u/s 11/2011 suggest 50% distal aorta disease, right common iliac artery with equal or less than 50% disease, left common iliac with equal or greater than 60% disease. no severe renal artery stenosis  On physical exam lungs with dullness at the bases, mildly decreased throughout, JVD 12 cm, heart sounds irregular, rapid, no murmurs appreciated, abdomen thin soft nontender, trace pitting edema around her ankles bilaterally  Lab work reviewed showing potassium 3.4, creatinine 1.42, normal cardiac enzymes, BNP 362, hematocrit 34  EKG reviewed showing atrial fibrillation with RVR nonspecific ST-T wave changes, rate in the 140 range  1) New-onset atrial fibrillation  suspect arrhythmia presented several weeks ago given shortness of breath ankle edema starting at the beginning of the new year . ---Started on heparin infusion  --- On diltiazem infusion  --- Recommended we give digoxin 0.25 IV push for rate control, may be able to handle 0.125 mg every other day in the setting of renal dysfunction Hold parameters placed on diltiazem 30 mg every 6  Echocardiogram pending  If able to achieve adequate rate control, will need one month on anticoagulation prior to attempting to restore  normal sinus rhythm  2) lung mass Long discussion with her concerning recent finding Hoping it is not cancer Long smoking history, suspect lung cancer right middle lobe Patient willing to entertain options for workup, previously reluctant Takes care of sick husband but now his shortness of breath symptoms Will be able to stop heparin if biopsy needed  3) acute respiratory distress with hypoxia Underlying COPD, atrial fibrillation with RVR Agree with Lasix,   4) CAD, CABG Cardiac enzymes negative, no plan for stress testing at this time    long discussion with patient concerning lung mass, treatment options for her atrial fibrillation , also discussed goals of care . Very anxious, would like to be around for her husband who needs her help  Greater than 50% was spent in counseling and coordination of care with patient Total encounter time 110 minutes or more   Signed: Esmond Plants  M.D., Ph.D. [facility]

## 2017-01-06 NOTE — ED Notes (Signed)
Sandwich tray given at this time 

## 2017-01-06 NOTE — Progress Notes (Signed)
The Nurse in the ED asked [facility] to visit the Pt. Pt exhibited shortness of breath but was alert. Pt was visited by the [facility] of her town whom the [facility] had the privilege of meeting with, and upon the Pt's requested for prayers, the [facility] offered prayer for the Pt. Pt appreciate the CH's visit.    01/06/17 1300  Clinical Encounter Type  Visited With Patient;Family  Visit Type Initial;Follow-up  Referral From Nurse  Consult/Referral To Chaplain  Spiritual Encounters  Spiritual Needs Prayer;Other (Comment)

## 2017-01-06 NOTE — ED Provider Notes (Signed)
[facility] Emergency Department Provider Note  ____________________________________________  Time seen: Approximately 11:16 AM  I have reviewed the triage vital signs and the nursing notes.   HISTORY  Chief Complaint Shortness of Breath    HPI Donna Lane is a 83 y.o. female with a history of CAD status post CABG, diastolic CHF with pulmonary hypertension, CKD, HTN and HL presenting with 1 month of progressively worsening shortness of breath and lower extremity swelling. The patient reports initial exertional dyspnea but now short of breath even at rest. In addition she has noted lower extremity swelling bilaterally. She has gained 7 pounds. She has also been experiencing "heart racing" and palpitations without any chest pain, tightness or pressure. Also reports decreased urination.  On arrival to the emergency department, the patient is in atrial fibrillation with a rapid ventricular rate in the 140s; she has not been diagnosed with atrial fibrillation in the past.   Past Medical History:  Diagnosis Date  . CHF (congestive heart failure) ([place])   . Chronic diastolic heart failure (HCC)    with pulmonary HTN  . Chronic kidney disease   . COPD (chronic obstructive pulmonary disease) ([place])   . Coronary artery disease 2008   CABG x 4,   . Diabetes mellitus   . Diverticulosis    on colonoscopy 08/12/2006  . Hyperlipidemia   . Hypertension   . Melanoma ([place])    removed by derm   . PVD (peripheral vascular disease) ([place])   . Spinal stenosis     Patient Active Problem List   Diagnosis Date Noted  . Elevated troponin 11/29/2016  . Acute renal insufficiency 11/29/2016  . Hyponatremia 11/29/2016  . Lung mass 11/29/2016  . Atelectasis of right lung 11/29/2016  . Left eye complaint 07/14/2016  . Caregiver stress 07/14/2016  . Olecranon bursitis of right elbow 06/19/2016  . Myalgia and myositis 05/20/2016  . Chronic kidney disease, stage 3 01/09/2016   . Advance care planning 01/05/2016  . Spinal stenosis of lumbar region 01/05/2016  . Chronic diastolic CHF (congestive heart failure) ([place]) 10/12/2013  . Diabetes mellitus type 2, uncontrolled, with complications ([place]) 00/93/8182  . Hyperlipidemia 03/14/2010  . Essential hypertension 03/14/2010  . CORONARY ATHEROSLERO AUTOL VEIN BYPASS GRAFT 03/14/2010  . Peripheral arterial disease ([place]) 03/14/2010  . Chest pain 03/14/2010    Past Surgical History:  Procedure Laterality Date  . CARDIAC CATHETERIZATION     PTCA x 3  . CATARACT EXTRACTION    . CORONARY ARTERY BYPASS GRAFT  2008   x 4  . HIP FRACTURE SURGERY Left    pins placed w/o hip replacement, hardware removed later  . LUMBAR DISC SURGERY     in her 72s  . tonsillectomy    . VAGINAL DELIVERY     x2    Current Outpatient Rx  . Order #: [id number] Class: Historical Med  . Order #: [id number] Class: Historical Med  . Order #: [id number] Class: Normal  . Order #: [id number] Class: Normal  . Order #: [id number] Class: Normal  . Order #: [id number] Class: Normal  . Order #: [id number] Class: Normal  . Order #: [id number] Class: Normal  . Order #: [id number] Class: Historical Med  . Order #: [id number] Class: Normal    Allergies Nsaids  Family History  Problem Relation Age of Onset  . Heart disease Father   . Heart disease Sister   . Diabetes Sister     Social History Social History  Substance Use Topics  . Smoking status: Former Smoker  Packs/day: 1.00    Years: 50.00    Types: Cigarettes    Quit date: 03/20/2007  . Smokeless tobacco: Never Used  . Alcohol use No    Review of Systems Constitutional: No fever/chills.No lightheadedness or syncope. Eyes: No visual changes. ENT: No sore throat. No congestion or rhinorrhea. Cardiovascular: Denies chest pain. Positive palpitations. Respiratory: Positive shortness of breath.  No cough. Gastrointestinal: No abdominal pain.  No nausea, no vomiting.  No diarrhea.  No  constipation. Genitourinary: Negative for dysuria.  + Decreased volume of urine and frequency of urination. Musculoskeletal: Negative for back pain. Positive bilateral symmetric lower exam he swelling. Skin: Negative for rash. Neurological: Negative for headaches. No focal numbness, tingling or weakness.   10-point ROS otherwise negative.  ____________________________________________   PHYSICAL EXAM:  VITAL SIGNS: ED Triage Vitals  Enc Vitals Group     BP 01/06/17 1041 (!) 144/85     Pulse Rate 01/06/17 1041 (!) 142     Resp 01/06/17 1041 20     Temp 01/06/17 1041 98 F (36.7 C)     Temp Source 01/06/17 1041 Oral     SpO2 01/06/17 1041 99 %     Weight 01/06/17 1042 127 lb (57.6 kg)     Height 01/06/17 1042 5' 2.5" (1.588 m)     Head Circumference --      Peak Flow --      Pain Score 01/06/17 1042 7     Pain Loc --      Pain Edu? --      Excl. in [place]? --     Constitutional: Alert and oriented. Chronically ill appearing but in no acute distress. Answers questions appropriately. Eyes: Conjunctivae are normal.  EOMI. No scleral icterus. Head: Atraumatic. Nose: No congestion/rhinnorhea. Mouth/Throat: Mucous membranes are moist.  Neck: No stridor.  Supple.  Positive JVD. Cardiovascular: Fast rate, irregular rhythm. No murmurs, rubs or gallops.  Respiratory: Normal respiratory effort.  No accessory muscle use or retractions. Lungs CTAB.  No wheezes, rales or ronchi. Prolonged expiratory phase with good air exchange. O2 sats are 96% or greater on room air on my examination. Gastrointestinal: Soft, nontender and nondistended.  No guarding or rebound.  No peritoneal signs. Musculoskeletal: Mild nonpitting lower extremity edema around the ankles bilaterally. No ttp in the calves or palpable cords.  Negative Homan's sign. Neurologic:  A&Ox3.  Speech is clear.  Face and smile are symmetric.  EOMI.  Moves all extremities well. Skin:  Skin is warm, dry and intact. No rash  noted. Psychiatric: Mood and affect are normal. Speech and behavior are normal.  Normal judgement.  ____________________________________________   LABS (all labs ordered are listed, but only abnormal results are displayed)  Labs Reviewed  BASIC METABOLIC PANEL - Abnormal; Notable for the following:       Result Value   Potassium 3.4 (*)    Chloride 100 (*)    Glucose, Bld 183 (*)    BUN 21 (*)    Creatinine, Ser 1.42 (*)    Calcium 8.7 (*)    GFR calc non Af Amer 32 (*)    GFR calc Af Amer 37 (*)    All other components within normal limits  CBC - Abnormal; Notable for the following:    WBC 13.3 (*)    Hemoglobin 11.6 (*)    HCT 34.3 (*)    All other components within normal limits  TROPONIN I - Abnormal; Notable for the following:    Troponin  I 0.03 (*)    All other components within normal limits  BRAIN NATRIURETIC PEPTIDE - Abnormal; Notable for the following:    B Natriuretic Peptide 362.0 (*)    All other components within normal limits   ____________________________________________  EKG  ED ECG REPORT I, Eula Listen, the attending physician, personally viewed and interpreted this ECG.   Date: 01/06/2017  EKG Time: 1040  Rate: 144  Rhythm: atrial fibrillation with rapid ventricular rate  Axis: normal  Intervals:none  ST&T Change: No ST elevation. No STEMI.  ____________________________________________  RADIOLOGY  Dg Chest 2 View  Result Date: 01/06/2017 CLINICAL DATA:  Shortness of breath. EXAM: CHEST  2 VIEW COMPARISON:  CT scan and radiograph of November 28, 2016. FINDINGS: Atherosclerosis of thoracic aorta is noted. Status post coronary artery bypass graft. No pneumothorax is noted. Large rounded density is seen along right cardiac border concerning for neoplasm or malignancy. Minimal bilateral pleural effusions are noted. Bony thorax is unremarkable. IMPRESSION: Large rounded density is noted along right cardiac border concerning for neoplasm or  malignancy. CT scan of the chest is recommended for further evaluation. Electronically Signed   By: Marijo Conception, M.D.   On: 01/06/2017 11:35    ____________________________________________   PROCEDURES  Procedure(s) performed: None  Procedures  Critical Care performed: Yes, see critical care note(s) ____________________________________________   INITIAL IMPRESSION / ASSESSMENT AND PLAN / ED COURSE  Pertinent labs & imaging results that were available during my care of the patient were reviewed by me and considered in my medical decision making (see chart for details).  83 y.o. female with multiple cardiac risk factors presenting with a month of progressive shortness of breath, swelling, weight gain, and now palpitations. On arrival to the emergency department, the patient is found to be in atrial fibrillation with a rapid ventricular rate although she continues to mentate normally. She is able to maintain her blood pressure with this. I will treat her with Cardizem, and aspirin. The patient will require admission to the hospital for further evaluation of arrhythmia, possible CHF exacerbation, and evaluation for ACS or MI.  CRITICAL CARE Performed by: Eula Listen   Total critical care time: 35 minutes  Critical care time was exclusive of separately billable procedures and treating other patients.  Critical care was necessary to treat or prevent imminent or life-threatening deterioration.  Critical care was time spent personally by me on the following activities: development of treatment plan with patient and/or surrogate as well as nursing, discussions with consultants, evaluation of patient's response to treatment, examination of patient, obtaining history from patient or surrogate, ordering and performing treatments and interventions, ordering and review of laboratory studies, ordering and review of radiographic studies, pulse oximetry and re-evaluation of patient's  condition.  ----------------------------------------- 11:49 AM on 01/06/2017 -----------------------------------------  The patient is a troponin of 0.03 which will need to be trended. Her BNP is mildly elevated at 362. Her kidney function is stable with a creatinine of 1.42. Her chest x-ray is concerning for a mass, so a CT scan has been ordered for further evaluation. ____________________________________________  FINAL CLINICAL IMPRESSION(S) / ED DIAGNOSES  Final diagnoses:  Elevated troponin  Lung mass  Acute on chronic diastolic congestive heart failure (HCC)  Atrial fibrillation with rapid ventricular response (HCC)         NEW MEDICATIONS STARTED DURING THIS VISIT:  New Prescriptions   No medications on file      Eula Listen, MD 01/06/17 1153

## 2017-01-06 NOTE — Progress Notes (Signed)
ANTICOAGULATION CONSULT NOTE - Initial Consult  Pharmacy Consult for Heparin Indication: atrial fibrillation  Allergies  Allergen Reactions  . Nsaids Other (See Comments)    Would avoid due to CKD.... Pt states she not allergic    Patient Measurements: Height: '5\' 2"'$  (157.5 cm) Weight: 131 lb 13.4 oz (59.8 kg) IBW/kg (Calculated) : 50.1 Heparin Dosing Weight: 57.6 kg  Vital Signs: Temp: 97.6 F (36.4 C) (01/22 1543) Temp Source: Oral (01/22 1543) BP: 113/80 (01/22 1543) Pulse Rate: 122 (01/22 1543)  Labs:  Recent Labs  01/06/17 1044  HGB 11.6*  HCT 34.3*  PLT 232  CREATININE 1.42*  TROPONINI 0.03*    Estimated Creatinine Clearance: 21.2 mL/min (by C-G formula based on SCr of 1.42 mg/dL (H)).   Medical History: Past Medical History:  Diagnosis Date  . CHF (congestive heart failure) ([place])   . Chronic diastolic heart failure (HCC)    with pulmonary HTN  . Chronic kidney disease   . COPD (chronic obstructive pulmonary disease) ([place])   . Coronary artery disease 2008   CABG x 4,   . Diabetes mellitus   . Diverticulosis    on colonoscopy 08/12/2006  . Hyperlipidemia   . Hypertension   . Melanoma ([place])    removed by derm   . PVD (peripheral vascular disease) ([place])   . Spinal stenosis     Medications:  Prescriptions Prior to Admission  Medication Sig Dispense Refill Last Dose  . acetaminophen (TYLENOL) 500 MG tablet Take 500 mg by mouth daily.    prn at prn  . aspirin EC 81 MG tablet Take by mouth daily.    unknown at unknown  . furosemide (LASIX) 40 MG tablet TAKE ONE TABLET BY MOUTH TWICE DAILY 60 tablet 2 01/06/2017 at 0700  . glipiZIDE (GLUCOTROL XL) 5 MG 24 hr tablet TAKE ONE TABLET BY MOUTH IN THE MORNING 30 tablet 5 01/06/2017 at 0700  . hydrALAZINE (APRESOLINE) 25 MG tablet TAKE ONE TABLET BY MOUTH TWICE DAILY 60 tablet 5 01/06/2017 at 0700  . lisinopril (ZESTRIL) 2.5 MG tablet Take 0.5 tablets (1.25 mg total) by mouth daily. 45 tablet 3 01/06/2017 at  Unknown time  . nitroGLYCERIN (NITROSTAT) 0.4 MG SL tablet Place 0.4 mg under the tongue every 5 (five) minutes as needed for chest pain.    prn at prn  . simvastatin (ZOCOR) 20 MG tablet TAKE ONE TABLET BY MOUTH ONCE DAILY 30 tablet 3 01/06/2017 at Unknown time  . Insulin Detemir (LEVEMIR) 100 UNIT/ML Pen Inject 3-5 Units into the skin at bedtime as needed (for hyperglycemia). 15 mL prn 11/27/2016 at 2100  . isosorbide mononitrate (IMDUR) 60 MG 24 hr tablet TAKE ONE-HALF TABLET BY MOUTH ONCE DAILY (Patient not taking: Reported on 11/28/2016) 90 tablet 3 unknown at unknown    Assessment: 83 y/o F with a h/o CABG and CHF admitted with new-onset atrial fibrillation.   Goal of Therapy:  Heparin level 0.3-0.7 units/ml Monitor platelets by anticoagulation protocol: Yes   Plan:  Give 3500 units bolus x 1 Start heparin infusion at 850 units/hr Check anti-Xa level in 8 hours and daily while on heparin Continue to monitor H&H and platelets  Ulice Dash D 01/06/2017,3:51 PM

## 2017-01-07 ENCOUNTER — Inpatient Hospital Stay (HOSPITAL_COMMUNITY)
Admit: 2017-01-07 | Discharge: 2017-01-07 | Disposition: A | Discharge status: Home / Self Care | Payer: Medicare Other | Attending: Physician Assistant | Admitting: Physician Assistant

## 2017-01-07 ENCOUNTER — Ambulatory Visit: Payer: PRIVATE HEALTH INSURANCE | Admitting: Cardiovascular Disease

## 2017-01-07 DIAGNOSIS — E785 Hyperlipidemia, unspecified: Secondary | ICD-10-CM

## 2017-01-07 DIAGNOSIS — I2510 Atherosclerotic heart disease of native coronary artery without angina pectoris: Secondary | ICD-10-CM

## 2017-01-07 DIAGNOSIS — I4891 Unspecified atrial fibrillation: Secondary | ICD-10-CM

## 2017-01-07 LAB — CBC
HCT: 30.9 % — ABNORMAL LOW (ref 35.0–47.0)
Hemoglobin: 10.4 g/dL — ABNORMAL LOW (ref 12.0–16.0)
MCH: 29.8 pg (ref 26.0–34.0)
MCHC: 33.7 g/dL (ref 32.0–36.0)
MCV: 88.4 fL (ref 80.0–100.0)
Platelets: 209 10*3/uL (ref 150–440)
RBC: 3.5 MIL/uL — ABNORMAL LOW (ref 3.80–5.20)
RDW: 13.4 % (ref 11.5–14.5)
WBC: 12.4 10*3/uL — ABNORMAL HIGH (ref 3.6–11.0)

## 2017-01-07 LAB — BASIC METABOLIC PANEL
Anion gap: 7 (ref 5–15)
BUN: 27 mg/dL — AB (ref 6–20)
CALCIUM: 8.5 mg/dL — AB (ref 8.9–10.3)
CO2: 27 mmol/L (ref 22–32)
CREATININE: 1.47 mg/dL — AB (ref 0.44–1.00)
Chloride: 105 mmol/L (ref 101–111)
GFR calc Af Amer: 35 mL/min — ABNORMAL LOW (ref >60)
GFR calc non Af Amer: 30 mL/min — ABNORMAL LOW (ref >60)
Glucose, Bld: 131 mg/dL — ABNORMAL HIGH (ref 65–99)
Potassium: 4.5 mmol/L (ref 3.5–5.1)
Sodium: 139 mmol/L (ref 135–145)

## 2017-01-07 LAB — LIPID PANEL
CHOLESTEROL: 94 mg/dL (ref 0–200)
HDL: 33 mg/dL — ABNORMAL LOW (ref >40)
LDL Cholesterol: 48 mg/dL (ref 0–99)
Total CHOL/HDL Ratio: 2.8 RATIO
Triglycerides: 67 mg/dL (ref <150)
VLDL: 13 mg/dL (ref 0–40)

## 2017-01-07 LAB — ECHOCARDIOGRAM COMPLETE
HEIGHTINCHES: 62 in
WEIGHTICAEL: 2119.94 [oz_av]

## 2017-01-07 LAB — HEPARIN LEVEL (UNFRACTIONATED)
HEPARIN UNFRACTIONATED: 0.32 [IU]/mL (ref 0.30–0.70)
Heparin Unfractionated: 0.39 IU/mL (ref 0.30–0.70)

## 2017-01-07 LAB — GLUCOSE, CAPILLARY
GLUCOSE-CAPILLARY: 139 mg/dL — AB (ref 65–99)
GLUCOSE-CAPILLARY: 171 mg/dL — AB (ref 65–99)
GLUCOSE-CAPILLARY: 168 mg/dL — AB (ref 65–99)
Glucose-Capillary: 223 mg/dL — ABNORMAL HIGH (ref 65–99)

## 2017-01-07 MED ORDER — DILTIAZEM HCL 30 MG PO TABS
60.0000 mg | ORAL_TABLET | Freq: Three times a day (TID) | ORAL | Status: DC
  Administered 2017-01-07 – 2017-01-08 (×3): 60 mg via ORAL
  Filled 2017-01-07 (×3): qty 2

## 2017-01-07 MED ORDER — DILTIAZEM HCL 30 MG PO TABS
60.0000 mg | ORAL_TABLET | Freq: Three times a day (TID) | ORAL | Status: DC
  Administered 2017-01-07: 60 mg via ORAL
  Filled 2017-01-07: qty 2

## 2017-01-07 MED ORDER — DIGOXIN 125 MCG PO TABS
0.1250 mg | ORAL_TABLET | ORAL | Status: DC
  Administered 2017-01-08: 0.125 mg via ORAL
  Filled 2017-01-07: qty 1

## 2017-01-07 NOTE — Discharge Instructions (Signed)
Heart Failure Clinic appointment on January 17, 2017 at 11:00am with Darylene Price, [place]. Please call [phone] to reschedule.

## 2017-01-07 NOTE — Progress Notes (Signed)
Patient Name: Donna Lane Date of Encounter: 01/07/2017  Primary Cardiologist: [facility] Problem List     Principal Problem:   Acute respiratory distress Active Problems:   CORONARY ATHEROSLERO AUTOL VEIN BYPASS GRAFT   Chronic kidney disease, stage 3   Lung mass   Atrial fibrillation with rapid ventricular response (HCC)   COPD (chronic obstructive pulmonary disease) (HCC)   Diabetes mellitus type 2, uncontrolled, with complications (HCC)   Essential hypertension   Hypokalemia   Hyperlipidemia     Subjective   No acute overnight events. Remains in Afib with improved heart rates into the low 570'V bpm. Still uncertain if she would like lung biopsy.    Inpatient Medications    Scheduled Meds: . acetaminophen  500 mg Oral Daily  . aspirin EC  81 mg Oral Daily  . atorvastatin  10 mg Oral q1800  . diltiazem  30 mg Oral Q6H  . docusate sodium  100 mg Oral BID  . furosemide  20 mg Intravenous BID  . insulin aspart  0-9 Units Subcutaneous TID WC  . lisinopril  1.25 mg Oral Daily   Continuous Infusions: . diltiazem (CARDIZEM) infusion 5 mg/hr (01/07/17 0500)  . heparin 850 Units/hr (01/07/17 0200)   PRN Meds: acetaminophen **OR** acetaminophen, bisacodyl, nitroGLYCERIN, ondansetron **OR** ondansetron (ZOFRAN) IV, traZODone   Vital Signs    Vitals:   01/06/17 2303 01/07/17 0454 01/07/17 0500 01/07/17 0625  BP: 102/61  118/65 (!) 117/55  Pulse:      Resp:      Temp:   98.2 F (36.8 C)   TempSrc:   Oral   SpO2:   95%   Weight:  132 lb 7.9 oz (60.1 kg)    Height:        Intake/Output Summary (Last 24 hours) at 01/07/17 0722 Last data filed at 01/07/17 0500  Gross per 24 hour  Intake           148.88 ml  Output                0 ml  Net           148.88 ml   Filed Weights   01/06/17 1042 01/06/17 1543 01/07/17 0454  Weight: 127 lb (57.6 kg) 131 lb 13.4 oz (59.8 kg) 132 lb 7.9 oz (60.1 kg)    Physical Exam    GEN: Frail appearing, in no  acute distress.  HEENT: Grossly normal.  Neck: Supple, no JVD, carotid bruits, or masses. Cardiac: Irregularly iorregular, no murmurs, rubs, or gallops. No clubbing, cyanosis, edema.  Radials/DP/PT 2+ and equal bilaterally.  Respiratory:  Expiratory wheezing bilaterally. GI: Soft, nontender, nondistended, BS + x 4. MS: no deformity or atrophy. Skin: warm and dry, no rash. Neuro:  Strength and sensation are intact. Psych: AAOx3.  Normal affect.  Labs    CBC  Recent Labs  01/06/17 1044 01/07/17 0133  WBC 13.3* 12.4*  HGB 11.6* 10.4*  HCT 34.3* 30.9*  MCV 87.9 88.4  PLT 232 779   Basic Metabolic Panel  Recent Labs  01/06/17 1044 01/06/17 1601 01/07/17 0133  NA 135  --  139  K 3.4*  --  4.5  CL 100*  --  105  CO2 24  --  27  GLUCOSE 183*  --  131*  BUN 21*  --  27*  CREATININE 1.42*  --  1.47*  CALCIUM 8.7*  --  8.5*  MG  --  2.0  --  Liver Function Tests No results for input(s): AST, ALT, ALKPHOS, BILITOT, PROT, ALBUMIN in the last 72 hours. No results for input(s): LIPASE, AMYLASE in the last 72 hours. Cardiac Enzymes  Recent Labs  01/06/17 1044 01/06/17 1601  TROPONINI 0.03* <0.03   BNP Invalid input(s): POCBNP D-Dimer No results for input(s): DDIMER in the last 72 hours. Hemoglobin A1C No results for input(s): HGBA1C in the last 72 hours. Fasting Lipid Panel  Recent Labs  01/07/17 0133  CHOL 94  HDL 33*  LDLCALC 48  TRIG 67  CHOLHDL 2.8   Thyroid Function Tests  Recent Labs  01/06/17 1601  TSH 1.195    Telemetry    Afib with RVR, low 100's bpm - Personally Reviewed  ECG    n/a - Personally Reviewed  Radiology    Dg Chest 2 View  Result Date: 01/06/2017 CLINICAL DATA:  Shortness of breath. EXAM: CHEST  2 VIEW COMPARISON:  CT scan and radiograph of November 28, 2016. FINDINGS: Atherosclerosis of thoracic aorta is noted. Status post coronary artery bypass graft. No pneumothorax is noted. Large rounded density is seen along right  cardiac border concerning for neoplasm or malignancy. Minimal bilateral pleural effusions are noted. Bony thorax is unremarkable. IMPRESSION: Large rounded density is noted along right cardiac border concerning for neoplasm or malignancy. CT scan of the chest is recommended for further evaluation. Electronically Signed   By: Marijo Conception, M.D.   On: 01/06/2017 11:35    Cardiac Studies   Echo pending  Patient Profile     83 y.o. female with history of s/p CABG in 2505, chronic diastolic CHF, recently diagnosed RML lung mass, COPD 2/2 tobacco abuse of 50 pack years, PVD, bilateral renal artery stenosis, CKD stage IV, carotid artery disease, DM, HTN, and HLD who presents with increased SOB x 3 weeks, found to be in new onset Afib.  Assessment & Plan    1. Acute respiratory distress with hypoxia: -Likely multifactorial including new onset Afib with RVR, RML lung mass, COPD, and acute on chronic diastolic CHF -Wean oxygen as able  2. New onset Afib with RVR: -Heart rate improved on Cardizem gtt -Will discontinue Cardizem gtt and increase PO short-acting diltiazem to 60 mg q 8 hours for rate control -Will start digoxin 0.125 mg every other day on 1/24 -Likely in the setting of the above -Uncertain exactly how long she has been in this rhythm, appears she was in sinus rhythm with MAT in mid December 2017 -She is now more open to possible bronchoscopy with biopsy for her RML lung mass, timing of this is currently uncertain given the above -Given she is more open to bronchoscopy, will continue with heparin gtt that was ordered at time of admission for full-dose anticoagulation -If she decides she does not want a bronchoscopy or if this is planned for outpatient would transition to [place] with Eliquis or Xarelto  -CHADS2VASc at least 7 (CHF, HTN, age x 2, DM, vascular disease, female) -TSH normal  3. RML lung mass: -As above -Will need heart rate to be better controlled prior to possible  procedure -Per IM  4. Chest heaviness/CAD s/p 4 vessel CABG in 2008: -Troponin negative  -ASA, Imdur, lisinopril -Change simvastatin to Lipitor given need to titrate Cardizem as above -Check echo -Check lipid and A1C for further risk stratification   5. Acute on chronic diastolic CHF: -Cannot rule out possible reduced EF at this time, consider tachy-mediated vs ischemic  -IV Lasix 20 mg bid  with KCl repletion -Not on beta blocker at this time given breathing, start when able  6. CKD stage III: -Stable -Monitor with diuresis  7. Hypokalemia: -Repleted -Magnesium at goal  8. HTN: -Stable  9. HLD: -Simvastatin changed to Lipitor as above on 1/23 secondary to Cardizem  10. DM2: -Per IM   Signed, Christell Faith, PA-C [facility] Pager: [phone] 01/07/2017, 7:22 AM

## 2017-01-07 NOTE — Progress Notes (Signed)
Family Meeting Note  Advance Directive:no  Today a meeting took place with the Patient.  The following clinical team members were present during this meeting:MD  The following were discussed:Patient's diagnosis: New A fib, LUng mass , Patient's progosis: Unable to determine and Goals for treatment: Continue present management  She want to be a limited code- no for ventilator, but yes for Cardioversion and CPR.  Additional follow-up to be provided: pulmonary  Time spent during discussion:20 minutes  Sarahy Creedon, Rosalio Macadamia, MD

## 2017-01-07 NOTE — Progress Notes (Signed)
ANTICOAGULATION CONSULT NOTE - Initial Consult  Pharmacy Consult for Heparin Indication: atrial fibrillation  Allergies  Allergen Reactions  . Nsaids Other (See Comments)    Would avoid due to CKD.... Pt states she not allergic    Patient Measurements: Height: '5\' 2"'$  (157.5 cm) Weight: 131 lb 13.4 oz (59.8 kg) IBW/kg (Calculated) : 50.1 Heparin Dosing Weight: 57.6 kg  Vital Signs: Temp: 98.2 F (36.8 C) (01/22 1915) Temp Source: Oral (01/22 1915) BP: 102/61 (01/22 2303) Pulse Rate: 115 (01/22 1845)  Labs:  Recent Labs  01/06/17 1044 01/06/17 1600 01/06/17 1601 01/07/17 0133  HGB 11.6*  --   --  10.4*  HCT 34.3*  --   --  30.9*  PLT 232  --   --  209  APTT  --  30  --   --   LABPROT  --  13.9  --   --   INR  --  1.07  --   --   HEPARINUNFRC  --   --   --  0.39  CREATININE 1.42*  --   --  1.47*  TROPONINI 0.03*  --  <0.03  --     Estimated Creatinine Clearance: 20.5 mL/min (by C-G formula based on SCr of 1.47 mg/dL (H)).   Medical History: Past Medical History:  Diagnosis Date  . Chronic diastolic heart failure ([place])   . CKD (chronic kidney disease), stage III   . COPD (chronic obstructive pulmonary disease) ([place])   . Coronary artery disease 2008   CABG x 4,   . Diabetes mellitus   . Diverticulosis    on colonoscopy 08/12/2006  . Hyperlipidemia   . Hypertension   . Melanoma ([place])    removed by derm   . PVD (peripheral vascular disease) ([place])   . Spinal stenosis     Medications:  Prescriptions Prior to Admission  Medication Sig Dispense Refill Last Dose  . acetaminophen (TYLENOL) 500 MG tablet Take 500 mg by mouth daily.    prn at prn  . aspirin EC 81 MG tablet Take by mouth daily.    unknown at unknown  . furosemide (LASIX) 40 MG tablet TAKE ONE TABLET BY MOUTH TWICE DAILY 60 tablet 2 01/06/2017 at 0700  . glipiZIDE (GLUCOTROL XL) 5 MG 24 hr tablet TAKE ONE TABLET BY MOUTH IN THE MORNING 30 tablet 5 01/06/2017 at 0700  . hydrALAZINE (APRESOLINE) 25  MG tablet TAKE ONE TABLET BY MOUTH TWICE DAILY 60 tablet 5 01/06/2017 at 0700  . lisinopril (ZESTRIL) 2.5 MG tablet Take 0.5 tablets (1.25 mg total) by mouth daily. 45 tablet 3 01/06/2017 at Unknown time  . nitroGLYCERIN (NITROSTAT) 0.4 MG SL tablet Place 0.4 mg under the tongue every 5 (five) minutes as needed for chest pain.    prn at prn  . simvastatin (ZOCOR) 20 MG tablet TAKE ONE TABLET BY MOUTH ONCE DAILY 30 tablet 3 01/06/2017 at Unknown time  . Insulin Detemir (LEVEMIR) 100 UNIT/ML Pen Inject 3-5 Units into the skin at bedtime as needed (for hyperglycemia). 15 mL prn 11/27/2016 at 2100  . isosorbide mononitrate (IMDUR) 60 MG 24 hr tablet TAKE ONE-HALF TABLET BY MOUTH ONCE DAILY (Patient not taking: Reported on 11/28/2016) 90 tablet 3 unknown at unknown    Assessment: 83 y/o F with a h/o CABG and CHF admitted with new-onset atrial fibrillation.   Goal of Therapy:  Heparin level 0.3-0.7 units/ml Monitor platelets by anticoagulation protocol: Yes   Plan:  Give 3500 units bolus x  1 Start heparin infusion at 850 units/hr Check anti-Xa level in 8 hours and daily while on heparin Continue to monitor H&H and platelets   0123 01:30 heparin level 0.39. Continue current regimen. Recheck in 8 hours to confirm.  Makara Lanzo S 01/07/2017,3:55 AM

## 2017-01-07 NOTE — Care Management (Signed)
RNCM consult received for patient concerns about her husband and concerns. I have attempted to reach her daughter [name] [phone] and left message for [name] to call this RNCM.

## 2017-01-07 NOTE — Progress Notes (Signed)
*  PRELIMINARY RESULTS* Echocardiogram 2D Echocardiogram has been performed.  Sherrie Sport 01/07/2017, 2:57 PM

## 2017-01-07 NOTE — Progress Notes (Signed)
ANTICOAGULATION CONSULT NOTE - Initial Consult  Pharmacy Consult for Heparin Indication: atrial fibrillation  Allergies  Allergen Reactions  . Nsaids Other (See Comments)    Would avoid due to CKD.... Pt states she not allergic    Patient Measurements: Height: '5\' 2"'$  (157.5 cm) Weight: 132 lb 7.9 oz (60.1 kg) IBW/kg (Calculated) : 50.1 Heparin Dosing Weight: 57.6 kg  Vital Signs: Temp: 97.6 F (36.4 C) (01/23 1500) Temp Source: Oral (01/23 1500) BP: 119/59 (01/23 1500) Pulse Rate: 96 (01/23 1514)  Labs:  Recent Labs  01/06/17 1044 01/06/17 1600 01/06/17 1601 01/07/17 0133 01/07/17 1008  HGB 11.6*  --   --  10.4*  --   HCT 34.3*  --   --  30.9*  --   PLT 232  --   --  209  --   APTT  --  30  --   --   --   LABPROT  --  13.9  --   --   --   INR  --  1.07  --   --   --   HEPARINUNFRC  --   --   --  0.39 0.32  CREATININE 1.42*  --   --  1.47*  --   TROPONINI 0.03*  --  <0.03  --   --     Estimated Creatinine Clearance: 20.5 mL/min (by C-G formula based on SCr of 1.47 mg/dL (H)).   Medical History: Past Medical History:  Diagnosis Date  . Chronic diastolic heart failure ([place])   . CKD (chronic kidney disease), stage III   . COPD (chronic obstructive pulmonary disease) ([place])   . Coronary artery disease 2008   CABG x 4,   . Diabetes mellitus   . Diverticulosis    on colonoscopy 08/12/2006  . Hyperlipidemia   . Hypertension   . Melanoma ([place])    removed by derm   . PVD (peripheral vascular disease) ([place])   . Spinal stenosis     Medications:  Prescriptions Prior to Admission  Medication Sig Dispense Refill Last Dose  . acetaminophen (TYLENOL) 500 MG tablet Take 500 mg by mouth daily.    prn at prn  . aspirin EC 81 MG tablet Take by mouth daily.    unknown at unknown  . furosemide (LASIX) 40 MG tablet TAKE ONE TABLET BY MOUTH TWICE DAILY 60 tablet 2 01/06/2017 at 0700  . glipiZIDE (GLUCOTROL XL) 5 MG 24 hr tablet TAKE ONE TABLET BY MOUTH IN THE MORNING 30  tablet 5 01/06/2017 at 0700  . hydrALAZINE (APRESOLINE) 25 MG tablet TAKE ONE TABLET BY MOUTH TWICE DAILY 60 tablet 5 01/06/2017 at 0700  . lisinopril (ZESTRIL) 2.5 MG tablet Take 0.5 tablets (1.25 mg total) by mouth daily. 45 tablet 3 01/06/2017 at Unknown time  . nitroGLYCERIN (NITROSTAT) 0.4 MG SL tablet Place 0.4 mg under the tongue every 5 (five) minutes as needed for chest pain.    prn at prn  . simvastatin (ZOCOR) 20 MG tablet TAKE ONE TABLET BY MOUTH ONCE DAILY 30 tablet 3 01/06/2017 at Unknown time  . Insulin Detemir (LEVEMIR) 100 UNIT/ML Pen Inject 3-5 Units into the skin at bedtime as needed (for hyperglycemia). 15 mL prn 11/27/2016 at 2100  . isosorbide mononitrate (IMDUR) 60 MG 24 hr tablet TAKE ONE-HALF TABLET BY MOUTH ONCE DAILY (Patient not taking: Reported on 11/28/2016) 90 tablet 3 unknown at unknown    Assessment: 83 y/o F with a h/o CABG and CHF admitted with new-onset  atrial fibrillation.   Goal of Therapy:  Heparin level 0.3-0.7 units/ml Monitor platelets by anticoagulation protocol: Yes   Plan:  Give 3500 units bolus x 1 Start heparin infusion at 850 units/hr Check anti-Xa level in 8 hours and daily while on heparin Continue to monitor H&H and platelets   0123 01:30 heparin level 0.39. Continue current regimen. Recheck in 8 hours to confirm.  0123 1000 heparin level 0.32. Therapeutic, continue heparin at current rate. Second level within range. Will recheck HL/CBC in the AM.   Loree Fee, PharmD 01/07/2017,3:37 PM

## 2017-01-07 NOTE — Progress Notes (Signed)
Initial HF Clinic appointment scheduled on January 17, 2017 at 11:00am. Thank you.

## 2017-01-07 NOTE — Progress Notes (Signed)
[place] rounding the unit visit the Pt whom he had seen before in the Ed. Pt appeared to be calm and hopeful. Pt talked about her ailing husband, her health concerns and decisions she needs to make, and the well-being of her husband. Penelope encouraged Pt, advised the Pt on completing Ad, prayed with patient, and offered a ministry of compassion and presence.     01/07/17 1500  Clinical Encounter Type  Visited With Patient  Visit Type Follow-up;Spiritual support  Referral From Chaplain  Consult/Referral To Chaplain  Spiritual Encounters  Spiritual Needs Prayer

## 2017-01-07 NOTE — Care Management Note (Signed)
Case Management Note  Patient Details  Name: Donna Lane MRN: [id number] Date of Birth: 02/18/1925  Subjective/Objective:                  Met with patient to discuss discharge planning and concerns with her husband and his caregiver needs. She was speaking with him on the phone when I entered the room. Patient denies needs. She states she is independent with daily activity. She states that she is trying to put some things in place in the home in the event she can no longer care of her husband as he depends on her. He has a feeding tube but has been feeding himself whiles she's been in the hospital- this was also confirmed with one of her daughters Cheryl.   Action/Plan: I provided list of home health agencies and advised patient (and Cheryl) to contact Dr. Duncan's office to arrange home health for her husband. She declined list of private duty agencies as she states she is on limited income.  She denies RNCM needs for herself.  Expected Discharge Date:                  Expected Discharge Plan:     In-House Referral:     Discharge planning Services  CM Consult  Post Acute Care Choice:    Choice offered to:  Adult Children, Patient  DME Arranged:    DME Agency:     HH Arranged:    HH Agency:     Status of Service:  In process, will continue to follow  If discussed at Long Length of Stay Meetings, dates discussed:    Additional Comments:  Angela Johnson, RN 01/07/2017, 3:48 PM  

## 2017-01-07 NOTE — Progress Notes (Signed)
Per RN case manager family has made arrangements for home care and there are no needs at this time. Please reconsult if future social work needs arise. CSW signing off.   [employer], LCSW [phone]

## 2017-01-08 DIAGNOSIS — R918 Other nonspecific abnormal finding of lung field: Secondary | ICD-10-CM

## 2017-01-08 DIAGNOSIS — J449 Chronic obstructive pulmonary disease, unspecified: Secondary | ICD-10-CM

## 2017-01-08 DIAGNOSIS — R0603 Acute respiratory distress: Secondary | ICD-10-CM

## 2017-01-08 DIAGNOSIS — I5033 Acute on chronic diastolic (congestive) heart failure: Secondary | ICD-10-CM

## 2017-01-08 LAB — CBC
HCT: 35 % (ref 35.0–47.0)
Hemoglobin: 11.4 g/dL — ABNORMAL LOW (ref 12.0–16.0)
MCH: 29.2 pg (ref 26.0–34.0)
MCHC: 32.6 g/dL (ref 32.0–36.0)
MCV: 89.7 fL (ref 80.0–100.0)
Platelets: 215 10*3/uL (ref 150–440)
RBC: 3.9 MIL/uL (ref 3.80–5.20)
RDW: 13.6 % (ref 11.5–14.5)
WBC: 13.8 10*3/uL — AB (ref 3.6–11.0)

## 2017-01-08 LAB — GLUCOSE, CAPILLARY
GLUCOSE-CAPILLARY: 163 mg/dL — AB (ref 65–99)
Glucose-Capillary: 275 mg/dL — ABNORMAL HIGH (ref 65–99)
Glucose-Capillary: 140 mg/dL — ABNORMAL HIGH (ref 65–99)
Glucose-Capillary: 174 mg/dL — ABNORMAL HIGH (ref 65–99)

## 2017-01-08 LAB — HEPARIN LEVEL (UNFRACTIONATED): HEPARIN UNFRACTIONATED: 0.36 [IU]/mL (ref 0.30–0.70)

## 2017-01-08 LAB — HEMOGLOBIN A1C
Hgb A1c MFr Bld: 7.5 % — ABNORMAL HIGH (ref 4.8–5.6)
MEAN PLASMA GLUCOSE: 169 mg/dL

## 2017-01-08 MED ORDER — DILTIAZEM HCL 60 MG PO TABS
60.0000 mg | ORAL_TABLET | Freq: Four times a day (QID) | ORAL | Status: DC
  Administered 2017-01-08 – 2017-01-09 (×4): 60 mg via ORAL
  Filled 2017-01-08 (×4): qty 1
  Filled 2017-01-08: qty 2
  Filled 2017-01-08: qty 1

## 2017-01-08 MED ORDER — FUROSEMIDE 40 MG PO TABS
40.0000 mg | ORAL_TABLET | Freq: Two times a day (BID) | ORAL | Status: DC
  Administered 2017-01-08 – 2017-01-09 (×2): 40 mg via ORAL
  Filled 2017-01-08 (×2): qty 1

## 2017-01-08 MED ORDER — APIXABAN 2.5 MG PO TABS
2.5000 mg | ORAL_TABLET | Freq: Two times a day (BID) | ORAL | Status: DC
  Administered 2017-01-08 – 2017-01-09 (×2): 2.5 mg via ORAL
  Filled 2017-01-08 (×2): qty 1

## 2017-01-08 NOTE — Progress Notes (Signed)
Patient Name: Donna Lane Date of Encounter: 01/08/2017  Primary Cardiologist: Esmond Plants, MD  Hospital Problem List     Principal Problem:   Acute respiratory distress Active Problems:   Diabetes mellitus type 2, uncontrolled, with complications (HCC)   Hyperlipidemia   Essential hypertension   CORONARY ATHEROSLERO AUTOL VEIN BYPASS GRAFT   Chronic kidney disease, stage 3   Lung mass   Atrial fibrillation with rapid ventricular response (HCC)   COPD (chronic obstructive pulmonary disease) ([place])   Hypokalemia     Subjective   Patient reports that she was anxious overnight. She continues to have some tightness across her chest and upper abdomen with deep inspiration and when talking. Breathing is similar to yesterday.  Inpatient Medications    Scheduled Meds: . acetaminophen  500 mg Oral Daily  . aspirin EC  81 mg Oral Daily  . atorvastatin  10 mg Oral q1800  . digoxin  0.125 mg Oral QODAY  . diltiazem  60 mg Oral Q8H  . docusate sodium  100 mg Oral BID  . furosemide  20 mg Intravenous BID  . insulin aspart  0-9 Units Subcutaneous TID WC  . lisinopril  1.25 mg Oral Daily   Continuous Infusions: . heparin 850 Units/hr (01/07/17 2000)   PRN Meds: acetaminophen **OR** acetaminophen, bisacodyl, nitroGLYCERIN, ondansetron **OR** ondansetron (ZOFRAN) IV, traZODone   Vital Signs    Vitals:   01/08/17 0400 01/08/17 0500 01/08/17 0551 01/08/17 0600  BP: (!) 123/56 101/65 114/71 128/64  Pulse: (!) 108 (!) 115  (!) 109  Resp: (!) '31 18  18  '$ Temp:      TempSrc:      SpO2: 91% 92%  92%  Weight:  126 lb 12.2 oz (57.5 kg)    Height:        Intake/Output Summary (Last 24 hours) at 01/08/17 0931 Last data filed at 01/08/17 0600  Gross per 24 hour  Intake            795.5 ml  Output                0 ml  Net            795.5 ml   Filed Weights   01/06/17 1543 01/07/17 0454 01/08/17 0500  Weight: 131 lb 13.4 oz (59.8 kg) 132 lb 7.9 oz (60.1 kg) 126 lb 12.2 oz  (57.5 kg)    Physical Exam    GEN: Frail appearing, in no acute distress.  HEENT: Grossly normal.  Neck: Supple, no JVD, carotid bruits, or masses. Cardiac: Irregularly iorregular, no murmurs, rubs, or gallops. Trace pretibial edema bilaterally.  Radials/DP/PT 2+ and equal bilaterally.  Respiratory:  Faint expiratory wheezes with fair air movement. No crackles. Normal work of breathing. GI: Soft, nontender, nondistended, BS + x 4. MS: no deformity or atrophy. Skin: warm and dry, no rash. Neuro:  Strength and sensation are intact. Psych: AAOx3.  Normal affect.  Labs    CBC  Recent Labs  01/07/17 0133 01/08/17 0553  WBC 12.4* 13.8*  HGB 10.4* 11.4*  HCT 30.9* 35.0  MCV 88.4 89.7  PLT 209 740   Basic Metabolic Panel  Recent Labs  01/06/17 1044 01/06/17 1601 01/07/17 0133  NA 135  --  139  K 3.4*  --  4.5  CL 100*  --  105  CO2 24  --  27  GLUCOSE 183*  --  131*  BUN 21*  --  27*  CREATININE 1.42*  --  1.47*  CALCIUM 8.7*  --  8.5*  MG  --  2.0  --    Liver Function Tests No results for input(s): AST, ALT, ALKPHOS, BILITOT, PROT, ALBUMIN in the last 72 hours. No results for input(s): LIPASE, AMYLASE in the last 72 hours. Cardiac Enzymes  Recent Labs  01/06/17 1044 01/06/17 1601  TROPONINI 0.03* <0.03   BNP Invalid input(s): POCBNP D-Dimer No results for input(s): DDIMER in the last 72 hours. Hemoglobin A1C  Recent Labs  01/07/17 0133  HGBA1C 7.5*   Fasting Lipid Panel  Recent Labs  01/07/17 0133  CHOL 94  HDL 33*  LDLCALC 48  TRIG 67  CHOLHDL 2.8   Thyroid Function Tests  Recent Labs  01/06/17 1601  TSH 1.195    Telemetry    Atrial fibrillation with heart rates from the 70s to 110s - Personally Reviewed  ECG    n/a - Personally Reviewed  Radiology    Dg Chest 2 View  Result Date: 01/06/2017 CLINICAL DATA:  Shortness of breath. EXAM: CHEST  2 VIEW COMPARISON:  CT scan and radiograph of November 28, 2016. FINDINGS:  Atherosclerosis of thoracic aorta is noted. Status post coronary artery bypass graft. No pneumothorax is noted. Large rounded density is seen along right cardiac border concerning for neoplasm or malignancy. Minimal bilateral pleural effusions are noted. Bony thorax is unremarkable. IMPRESSION: Large rounded density is noted along right cardiac border concerning for neoplasm or malignancy. CT scan of the chest is recommended for further evaluation. Electronically Signed   By: Marijo Conception, M.D.   On: 01/06/2017 11:35    Cardiac Studies   Echo (01/07/17): Normal LV size and wall thickness with LVEF 55-65%. No regional wall motion abnormalities. Moderate mitral regurgitation. Mild left atrial enlargement. Suboptimally imaged right ventricle, which is probably mildly enlarged with mildly reduced contraction. Mild right atrial dilation.  Patient Profile     83 y.o. female with history of s/p CABG in 1027, chronic diastolic CHF, recently diagnosed RML lung mass, COPD 2/2 tobacco abuse of 50 pack years, PVD, bilateral renal artery stenosis, CKD stage IV, carotid artery disease, DM, HTN, and HLD who presents with increased SOB x 3 weeks, found to be in new onset Afib.  Assessment & Plan    1. Acute respiratory distress with hypoxia: -Likely multifactorial including new onset Afib with RVR, RML lung mass, COPD, and acute on chronic diastolic CHF -Wean oxygen as able -Continue rate control, as below.  2. New onset Afib with RVR: -Patient on diltiazem 60 mg by mouth every 8 hours. Also received first dose of digoxin 125 g every other day this morning. -Blood pressure is normal today. Would favor increasing diltiazem and avoiding digoxin long-term. We will therefore increase digoxin to 60 mg every 6 hours with plan to convert to sustained-release once adequate rate control has been achieved. -CHADS2VASc at least 7 (CHF, HTN, age x 2, DM, vascular disease, female); once decision has been finalized as to  no invasive procedures, would recommend discontinuation of heparin and initiation of apixaban 2.5 mg twice a day. -If rate control remains difficult, could consider cardioversion. However, I would prefer therapeutic anticoagulation for 4 weeks prior to cardioversion as opposed to TEE guided cardioversion given the patient's age, comorbidities, and desire to avoid invasive procedures if possible. -TSH normal  3. RML lung mass: -Patient evaluated by pulmonary; it sounds as though she is not interested in bronchoscopy or surveillance CT at this  time. -Further management per pulmonary and internal medicine.  4. Chest heaviness/CAD s/p 4 vessel CABG in 2008: -Troponin negative; echo with normal LVEF without regional wall motion abnormalities. -ASA and atorvastatin -Suspect some of her chest tightness might be related to underlying pulmonary process and acute on chronic diastolic heart failure. -Given history of COPD and persistent wheezing on exam, treatment for possible obstructive lung disease should be considered; will defer this to internal medicine and pulmonary.  5. Acute on chronic diastolic CHF: -Patient continues to have evidence of mild fluid retention. -Will switch to furosemide 40 mg by mouth twice a day  6. CKD stage III: -Stable -Monitor with diuresis  7. Hypokalemia: -Repleted -Magnesium at goal  8. HTN: -Will hold lisinopril given soft blood pressures in attempt to up titrate diltiazem for improved heart rate control.  9. HLD: -Simvastatin changed to Lipitor as above on 1/23 secondary to diltiazem  10. DM2: -Per IM  Signed, Nelva Bush, MD [place] Pager: [phone] 01/08/2017, 9:31 AM

## 2017-01-08 NOTE — Progress Notes (Signed)
ANTICOAGULATION CONSULT NOTE - Initial Consult  Pharmacy Consult for Heparin Indication: atrial fibrillation  Allergies  Allergen Reactions  . Nsaids Other (See Comments)    Would avoid due to CKD.... Pt states she not allergic    Patient Measurements: Height: '5\' 2"'$  (157.5 cm) Weight: 126 lb 12.2 oz (57.5 kg) IBW/kg (Calculated) : 50.1 Heparin Dosing Weight: 57.6 kg  Vital Signs: Temp: 98.2 F (36.8 C) (01/23 2000) Temp Source: Oral (01/23 2000) BP: 128/64 (01/24 0600) Pulse Rate: 109 (01/24 0600)  Labs:  Recent Labs  01/06/17 1044 01/06/17 1600 01/06/17 1601 01/07/17 0133 01/07/17 1008 01/08/17 0553  HGB 11.6*  --   --  10.4*  --  11.4*  HCT 34.3*  --   --  30.9*  --  35.0  PLT 232  --   --  209  --  215  APTT  --  30  --   --   --   --   LABPROT  --  13.9  --   --   --   --   INR  --  1.07  --   --   --   --   HEPARINUNFRC  --   --   --  0.39 0.32 0.36  CREATININE 1.42*  --   --  1.47*  --   --   TROPONINI 0.03*  --  <0.03  --   --   --     Estimated Creatinine Clearance: 20.5 mL/min (by C-G formula based on SCr of 1.47 mg/dL (H)).   Medical History: Past Medical History:  Diagnosis Date  . Chronic diastolic heart failure ([place])   . CKD (chronic kidney disease), stage III   . COPD (chronic obstructive pulmonary disease) ([place])   . Coronary artery disease 2008   CABG x 4,   . Diabetes mellitus   . Diverticulosis    on colonoscopy 08/12/2006  . Hyperlipidemia   . Hypertension   . Melanoma ([place])    removed by derm   . PVD (peripheral vascular disease) ([place])   . Spinal stenosis     Medications:  Prescriptions Prior to Admission  Medication Sig Dispense Refill Last Dose  . acetaminophen (TYLENOL) 500 MG tablet Take 500 mg by mouth daily.    prn at prn  . aspirin EC 81 MG tablet Take by mouth daily.    unknown at unknown  . furosemide (LASIX) 40 MG tablet TAKE ONE TABLET BY MOUTH TWICE DAILY 60 tablet 2 01/06/2017 at 0700  . glipiZIDE (GLUCOTROL XL) 5  MG 24 hr tablet TAKE ONE TABLET BY MOUTH IN THE MORNING 30 tablet 5 01/06/2017 at 0700  . hydrALAZINE (APRESOLINE) 25 MG tablet TAKE ONE TABLET BY MOUTH TWICE DAILY 60 tablet 5 01/06/2017 at 0700  . lisinopril (ZESTRIL) 2.5 MG tablet Take 0.5 tablets (1.25 mg total) by mouth daily. 45 tablet 3 01/06/2017 at Unknown time  . nitroGLYCERIN (NITROSTAT) 0.4 MG SL tablet Place 0.4 mg under the tongue every 5 (five) minutes as needed for chest pain.    prn at prn  . simvastatin (ZOCOR) 20 MG tablet TAKE ONE TABLET BY MOUTH ONCE DAILY 30 tablet 3 01/06/2017 at Unknown time  . Insulin Detemir (LEVEMIR) 100 UNIT/ML Pen Inject 3-5 Units into the skin at bedtime as needed (for hyperglycemia). 15 mL prn 11/27/2016 at 2100  . isosorbide mononitrate (IMDUR) 60 MG 24 hr tablet TAKE ONE-HALF TABLET BY MOUTH ONCE DAILY (Patient not taking: Reported on 11/28/2016) 90  tablet 3 unknown at unknown    Assessment: 83 y/o F with a h/o CABG and CHF admitted with new-onset atrial fibrillation.   Goal of Therapy:  Heparin level 0.3-0.7 units/ml Monitor platelets by anticoagulation protocol: Yes   Plan:  Give 3500 units bolus x 1 Start heparin infusion at 850 units/hr Check anti-Xa level in 8 hours and daily while on heparin Continue to monitor H&H and platelets   0123 01:30 heparin level 0.39. Continue current regimen. Recheck in 8 hours to confirm.  0123 1000 heparin level 0.32. Therapeutic, continue heparin at current rate. Second level within range. Will recheck HL/CBC in the AM.   0124 AM heparin level 0.36. Continue current regimen. CBC and heparin level with tomorrow AM labs.  Rafiel Mecca S, PharmD 01/08/2017,6:54 AM

## 2017-01-08 NOTE — Progress Notes (Signed)
[facility] [place] NAME: Donna Lane    MR#:  [id number]  DATE OF BIRTH:  [birth year]  SUBJECTIVE:  CHIEF COMPLAINT:   Chief Complaint  Patient presents with  . Shortness of Breath   Came with SOB- chest heaviness. Have A fib with RVR, also found to have a lung mass recently.  Now in Rate control after IV cardizem and switched to oral. She agreed to have bronchoscopy after I discussed the details and prognosis.  After discussing with the Dr. Jenell Milliner- she decided not to have a bronchoscopy with repeat CT scan.  Cardiology suggested some changes in her oral medication for better heart rate control. REVIEW OF SYSTEMS:  CONSTITUTIONAL: No fever, fatigue or weakness.  EYES: No blurred or double vision.  EARS, NOSE, AND THROAT: No tinnitus or ear pain.  RESPIRATORY: No cough, shortness of breath, wheezing or hemoptysis.  CARDIOVASCULAR: No chest pain, orthopnea, edema.  GASTROINTESTINAL: No nausea, vomiting, diarrhea or abdominal pain.  GENITOURINARY: No dysuria, hematuria.  ENDOCRINE: No polyuria, nocturia,  HEMATOLOGY: No anemia, easy bruising or bleeding SKIN: No rash or lesion. MUSCULOSKELETAL: No joint pain or arthritis.   NEUROLOGIC: No tingling, numbness, weakness.  PSYCHIATRY: No anxiety or depression.   ROS  DRUG ALLERGIES:   Allergies  Allergen Reactions  . Nsaids Other (See Comments)    Would avoid due to CKD.... Pt states she not allergic    VITALS:  Blood pressure (!) 107/53, pulse 82, temperature 97.6 F (36.4 C), temperature source Oral, resp. rate (!) 26, height '5\' 2"'$  (1.575 m), weight 57.5 kg (126 lb 12.2 oz), SpO2 93 %.  PHYSICAL EXAMINATION:  GENERAL:  83 y.o.-year-old patient lying in the bed with no acute distress.  EYES: Pupils equal, round, reactive to light and accommodation. No scleral icterus. Extraocular muscles intact.  HEENT: Head atraumatic, normocephalic. Oropharynx and nasopharynx clear.  NECK:   Supple, no jugular venous distention. No thyroid enlargement, no tenderness.  LUNGS: Normal breath sounds bilaterally, no wheezing, rales,rhonchi or crepitation. No use of accessory muscles of respiration.  CARDIOVASCULAR: S1, S2 normal. No murmurs, rubs, or gallops.  ABDOMEN: Soft, nontender, nondistended. Bowel sounds present. No organomegaly or mass.  EXTREMITIES: No pedal edema, cyanosis, or clubbing.  NEUROLOGIC: Cranial nerves II through XII are intact. Muscle strength 5/5 in all extremities. Sensation intact. Gait not checked.  PSYCHIATRIC: The patient is alert and oriented x 3.  SKIN: No obvious rash, lesion, or ulcer.   Physical Exam LABORATORY PANEL:   CBC  Recent Labs Lab 01/08/17 0553  WBC 13.8*  HGB 11.4*  HCT 35.0  PLT 215   ------------------------------------------------------------------------------------------------------------------  Chemistries   Recent Labs Lab 01/06/17 1601 01/07/17 0133  NA  --  139  K  --  4.5  CL  --  105  CO2  --  27  GLUCOSE  --  131*  BUN  --  27*  CREATININE  --  1.47*  CALCIUM  --  8.5*  MG 2.0  --    ------------------------------------------------------------------------------------------------------------------  Cardiac Enzymes  Recent Labs Lab 01/06/17 1044 01/06/17 1601  TROPONINI 0.03* <0.03   ------------------------------------------------------------------------------------------------------------------  RADIOLOGY:  No results found.  ASSESSMENT AND PLAN:   Principal Problem:   Acute respiratory distress Active Problems:   Diabetes mellitus type 2, uncontrolled, with complications (HCC)   Hyperlipidemia   Essential hypertension   CORONARY ATHEROSLERO AUTOL VEIN BYPASS GRAFT   Chronic kidney disease, stage 3   Lung mass  Atrial fibrillation with rapid ventricular response (HCC)   COPD (chronic obstructive pulmonary disease) (HCC)   Hypokalemia   #1 new onset atrial fibrillation with RVR;   heart rate up to 130 bpm even with Cardizem 10 mg IV push. Then kept on Cardizem drip, IV heparin drip, cardiology consult with [facility] cardiology appreciated.  She is now in NSR and switched to oral cardizem.   Cardia suggested to make changes and increase the dose of Cardizem today. We will monitor her on telemetry for further care.   As patient decided now known for procedures, we can start her on oral anticoagulation.  #2. Acute on chronic diastolic heart failure due to atrial fibrillation: BNP slightly high at 362, start IV Lasix, check echocardiogram, check daily weights, appreciate cardiology following  #3 possible right lung cancer(:heavy smoker 50 pack yrs)  Patient did not want to have any workup when she had initial CAT scan in December. Discussed the findings again and the prognosis, she agreed to discuss with pulmonary team. I called pulm consult.  After discussing with pulmonary doctor patient decided not to go for the procedure and not to have a repeat CT scan.  #4 history of coronary artery disease, 4 vessel bypass before, follows up with [facility] cardiology Dr. [name]  5. Chronic kidney disease stage JQG:BEEFEO 6.Continued stress at home taking care of her husband who has persistentdysphagia since carotid surgery October 2016 . She is giving food by a feeding tube  7.Marland KitchenDMII;.resume home meds 8. peripheral vascular disease: Continue statins.    All the records are reviewed and case discussed with Care Management/Social Workerr. Management plans discussed with the patient, family and they are in agreement.  CODE STATUS: Limited, she don't want to be on ventilator but she wants CPR and defibrillation- if needed.  TOTAL TIME TAKING CARE OF THIS PATIENT: 35 minutes.    POSSIBLE D/C IN 2-3 DAYS, DEPENDING ON CLINICAL CONDITION.   Vaughan Basta M.D on 01/08/2017   Between 7am to 6pm - Pager - [phone]  After 6pm go to [email or web address] - password [email or web address]  [facility]  Office  [phone]  CC: Primary care physician; [name], MD  Note: This dictation was prepared with Dragon dictation along with smaller phrase technology. Any transcriptional errors that result from this process are unintentional.

## 2017-01-08 NOTE — Consult Note (Signed)
PULMONARY / CRITICAL CARE MEDICINE   Name: Donna Lane MRN: [id number] DOB: Aug 16, 1925    ADMISSION DATE:  01/06/2017 CONSULTATION DATE:  01/08/17  REFERRING MD : Reva Bores  CHIEF COMPLAINT:  RT lung opacity on CT chest  INITIAL PRESENTATION:acute afib   HISTORY OF PRESENT ILLNESS:   83 y.o. female with a known history of Coronary artery disease, severe 3 vessel disease with bypass surgery, history of peripheral vascular disease, bilateral renal artery stenosis, carotid disease who follows up with [facility] cardiology comes in because of shortness of breath, orthopnea, PND, pedal edema for last 3 weeks associated with chest heaviness. -Found to have atrial fibrillation with RVR heart rate up to 1 40 bpm admitted to ICU for rate control -She is diagnosed to have possible lung mass by CT chest in December but did not pursue any treatment or workup for that because she did not want to hear the word cancer.    I have has long discussion with patient regarding her CT chest. She states: "If I have lung mass, I Don't want to get any procedures at this time"  She wants to forget that it even exists.  She is alert and awake, follows commands, resp status is stable, no acute issues at this time She is close to be d/c home  I have explained that she will follow up with me in clinic and we can talk again about her CT chest findings     PAST MEDICAL HISTORY :   has a past medical history of Chronic diastolic heart failure ([place]); CKD (chronic kidney disease), stage III; COPD (chronic obstructive pulmonary disease) ([place]); Coronary artery disease (2008); Diabetes mellitus; Diverticulosis; Hyperlipidemia; Hypertension; Melanoma ([place]); PVD (peripheral vascular disease) ([place]); and Spinal stenosis.  has a past surgical history that includes Cardiac catheterization; Coronary artery bypass graft (2008); tonsillectomy; Cataract extraction; Hip fracture surgery (Left); Vaginal delivery; and Lumbar  disc surgery. Prior to Admission medications   Medication Sig Start Date End Date Taking? Authorizing Provider  acetaminophen (TYLENOL) 500 MG tablet Take 500 mg by mouth daily.    Yes Historical Provider, MD  aspirin EC 81 MG tablet Take by mouth daily.    Yes Historical Provider, MD  furosemide (LASIX) 40 MG tablet TAKE ONE TABLET BY MOUTH TWICE DAILY 12/23/16  Yes Tonia Ghent, MD  glipiZIDE (GLUCOTROL XL) 5 MG 24 hr tablet TAKE ONE TABLET BY MOUTH IN THE MORNING 11/05/16  Yes Tonia Ghent, MD  hydrALAZINE (APRESOLINE) 25 MG tablet TAKE ONE TABLET BY MOUTH TWICE DAILY 11/25/16  Yes Tonia Ghent, MD  lisinopril (ZESTRIL) 2.5 MG tablet Take 0.5 tablets (1.25 mg total) by mouth daily. 11/14/16  Yes Tonia Ghent, MD  nitroGLYCERIN (NITROSTAT) 0.4 MG SL tablet Place 0.4 mg under the tongue every 5 (five) minutes as needed for chest pain.    Yes Historical Provider, MD  simvastatin (ZOCOR) 20 MG tablet TAKE ONE TABLET BY MOUTH ONCE DAILY 10/08/16  Yes Minna Merritts, MD  Insulin Detemir (LEVEMIR) 100 UNIT/ML Pen Inject 3-5 Units into the skin at bedtime as needed (for hyperglycemia). 02/29/16   Tonia Ghent, MD  isosorbide mononitrate (IMDUR) 60 MG 24 hr tablet TAKE ONE-HALF TABLET BY MOUTH ONCE DAILY Patient not taking: Reported on 11/28/2016 10/23/16   Minna Merritts, MD   Allergies  Allergen Reactions  . Nsaids Other (See Comments)    Would avoid due to CKD.... Pt states she not allergic    FAMILY  HISTORY:  indicated that her mother is deceased. She indicated that her father is deceased. She indicated that only one of her three sisters is alive.   SOCIAL HISTORY:  reports that she quit smoking about 9 years ago. Her smoking use included Cigarettes. She has a 50.00 pack-year smoking history. She has never used smokeless tobacco. She reports that she does not drink alcohol or use drugs.  REVIEW OF SYSTEMS:   Review of Systems:  Gen:  Denies  fever, sweats, chills weigh  loss   HEENT: Denies blurred vision, double vision, ear pain, eye pain, hearing loss, nose bleeds, sore throat  Cardiac:  No dizziness, chest pain or heaviness, chest tightness,edema  Resp:   Denies cough or sputum porduction, shortness of breath,wheezing, hemoptysis,   Gi: Denies swallowing difficulty, stomach pain, nausea or vomiting, diarrhea, constipation, bowel incontinence  Ext:   Denies Joint pain, stiffness or swelling  Skin: Denies  skin rash, easy bruising or bleeding or hives  Endoc:  Denies polyuria, polydipsia , polyphagia or weight change  Psych:   Denies depression, insomnia or hallucinations   Other:  All other systems negative   SUBJECTIVE:   VITAL SIGNS: Temp:  [97.6 F (36.4 C)-98.2 F (36.8 C)] 98.2 F (36.8 C) (01/23 2000) Pulse Rate:  [77-126] 109 (01/24 0600) Resp:  [18-32] 18 (01/24 0600) BP: (92-130)/(49-99) 128/64 (01/24 0600) SpO2:  [84 %-95 %] 92 % (01/24 0600) Weight:  [126 lb 12.2 oz (57.5 kg)] 126 lb 12.2 oz (57.5 kg) (01/24 0500) HEMODYNAMICS:   VENTILATOR SETTINGS:   INTAKE / OUTPUT:  Intake/Output Summary (Last 24 hours) at 01/08/17 0915 Last data filed at 01/08/17 0600  Gross per 24 hour  Intake            795.5 ml  Output                0 ml  Net            795.5 ml    Physical Examination:   GENERAL:NAD, no fevers, chills, no weakness no fatigue HEAD: Normocephalic, atraumatic.  EYES: Pupils equal, round, reactive to light. Extraocular muscles intact. No scleral icterus.  MOUTH: Moist mucosal membrane. Dentition intact. No abscess noted.  EAR, NOSE, THROAT: Clear without exudates. No external lesions.  NECK: Supple. No thyromegaly. No nodules. No JVD.  PULMONARY: Diffuse coarse rhonchi right sided +wheezes CARDIOVASCULAR: S1 and S2. IRRegular rate and rhythm. No murmurs, rubs, or gallops. No edema. Pedal pulses 2+ bilaterally.  GASTROINTESTINAL: Soft, nontender, nondistended. No masses. Positive bowel sounds. No  hepatosplenomegaly.  MUSCULOSKELETAL: No swelling, clubbing, or edema. Range of motion full in all extremities.  NEUROLOGIC: Cranial nerves II through XII are intact. No gross focal neurological deficits. Sensation intact. Reflexes intact.  SKIN: No ulceration, lesions, rashes, or cyanosis. Skin warm and dry. Turgor intact.  PSYCHIATRIC: Mood, affect within normal limits. The patient is awake, alert and oriented x 3. Insight, judgment intact.  ALL OTHER ROS ARE NEGATIVE      LABS:  CBC  Recent Labs Lab 01/06/17 1044 01/07/17 0133 01/08/17 0553  WBC 13.3* 12.4* 13.8*  HGB 11.6* 10.4* 11.4*  HCT 34.3* 30.9* 35.0  PLT 232 209 215   Coag's  Recent Labs Lab 01/06/17 1600  APTT 30  INR 1.07   BMET  Recent Labs Lab 01/06/17 1044 01/07/17 0133  NA 135 139  K 3.4* 4.5  CL 100* 105  CO2 24 27  BUN 21* 27*  CREATININE 1.42* 1.47*  GLUCOSE 183* 131*   Electrolytes  Recent Labs Lab 01/06/17 1044 01/06/17 1601 01/07/17 0133  CALCIUM 8.7*  --  8.5*  MG  --  2.0  --    Sepsis Markers No results for input(s): LATICACIDVEN, PROCALCITON, O2SATVEN in the last 168 hours. ABG No results for input(s): PHART, PCO2ART, PO2ART in the last 168 hours. Liver Enzymes No results for input(s): AST, ALT, ALKPHOS, BILITOT, ALBUMIN in the last 168 hours. Cardiac Enzymes  Recent Labs Lab 01/06/17 1044 01/06/17 1601  TROPONINI 0.03* <0.03   Glucose  Recent Labs Lab 01/06/17 2136 01/07/17 0733 01/07/17 1131 01/07/17 1607 01/07/17 2122 01/08/17 0723  GLUCAP 157* 139* 223* 171* 168* 275*    CT chest shows RT mid lung opacity, likley endobronchial RT lung mass  ASSESSMENT / PLAN: 83 yo white female with extensive cardiac history admitted for acute afib with RVR with underlying probable RT lung mass highly suggestibve of cancer, she is former smoker 60 pack year.   PULMONARY Oxygen as needed I have explained that she will need Bronchoscopy  For definitive dx  The  Risks and Benefits of the Bronchoscopy  were explained to patient/family and I have discussed the risk for acute bleeding, increased chance of infection, increased chance of respiratory failure and cardiac arrest and death. I have also explained to avoid all types of NSAIDs to decrease chance of bleeding, and to avoid food and drinks the midnight prior to procedure.  The procedure consists of a video camera with a light source to be placed and inserted  into the lungs to  look for abnormal tissue and to obtain tissue samples by using needle and biopsy tools.  The patient understand the risks and benefits and HAS NOT AGREED to proceed with procedure.  She has also decided NOT to obtain another CT chest  Patient will follow up with me as outpatient in 4 weeks   CARDIOVASCULAR Follow up cardiology recs   I have personally obtained a history, examined the patient, evaluated Pertinent laboratory and RadioGraphic/imaging results, and  formulated the assessment and plan   The Patient requires high complexity decision making for assessment and support, frequent evaluation and titration of therapies.   Patient satisfied with Plan of action and management. All questions answered  Corrin Parker, M.D.  [facility]  Medical Director [place] Director [facility] Cardio-Pulmonary Department

## 2017-01-08 NOTE — Consult Note (Signed)
ANTICOAGULATION CONSULT NOTE - Initial Consult  Pharmacy Consult for apixaban Indication: afib  Allergies  Allergen Reactions  . Nsaids Other (See Comments)    Would avoid due to CKD.... Pt states she not allergic    Patient Measurements: Height: '5\' 4"'$  (162.6 cm) Weight: 130 lb 12.8 oz (59.3 kg) IBW/kg (Calculated) : 54.7 Heparin Dosing Weight:   Vital Signs: Temp: 97.7 F (36.5 C) (01/24 1552) Temp Source: Oral (01/24 1552) BP: 134/77 (01/24 1552) Pulse Rate: 70 (01/24 1552)  Labs:  Recent Labs  01/06/17 1044 01/06/17 1600 01/06/17 1601 01/07/17 0133 01/07/17 1008 01/08/17 0553  HGB 11.6*  --   --  10.4*  --  11.4*  HCT 34.3*  --   --  30.9*  --  35.0  PLT 232  --   --  209  --  215  APTT  --  30  --   --   --   --   LABPROT  --  13.9  --   --   --   --   INR  --  1.07  --   --   --   --   HEPARINUNFRC  --   --   --  0.39 0.32 0.36  CREATININE 1.42*  --   --  1.47*  --   --   TROPONINI 0.03*  --  <0.03  --   --   --     Estimated Creatinine Clearance: 22.4 mL/min (by C-G formula based on SCr of 1.47 mg/dL (H)).   Medical History: Past Medical History:  Diagnosis Date  . Chronic diastolic heart failure ([place])   . CKD (chronic kidney disease), stage III   . COPD (chronic obstructive pulmonary disease) ([place])   . Coronary artery disease 2008   CABG x 4,   . Diabetes mellitus   . Diverticulosis    on colonoscopy 08/12/2006  . Hyperlipidemia   . Hypertension   . Melanoma ([place])    removed by derm   . PVD (peripheral vascular disease) ([place])   . Spinal stenosis     Medications:  Scheduled:  . acetaminophen  500 mg Oral Daily  . apixaban  2.5 mg Oral BID  . aspirin EC  81 mg Oral Daily  . atorvastatin  10 mg Oral q1800  . diltiazem  60 mg Oral Q6H  . docusate sodium  100 mg Oral BID  . furosemide  40 mg Oral BID  . insulin aspart  0-9 Units Subcutaneous TID WC    Assessment: Pt is a 83 year old female with new onset afib with RVR. Pharmacy  consulted to switch patient from heparin drip to apixaban  Goal of Therapy:   Monitor platelets by anticoagulation protocol: Yes   Plan:  Pt is >28 years old and TBW is <60kg, therefore apixaban 2.'5mg'$  BID.Spoke with Stanton Kidney, NR and educated her to turn off heparin drip at the time the first apixaban dose is given. She will pass message on to night shift. Pharmacy to continue to monitor.  Ramond Dial, Pharm.D, BCPS Clinical Pharmacist  01/08/2017,4:23 PM

## 2017-01-08 NOTE — Progress Notes (Signed)
[place] [facility] [place] NAME: Donna Lane    MR#:  [id number]  DATE OF BIRTH:  1925/07/04  SUBJECTIVE:  CHIEF COMPLAINT:   Chief Complaint  Patient presents with  . Shortness of Breath   Came with SOB- chest heaviness. Have A fib with RVR, also found to have a lung mass recently.  Now in NSR after IV cardizem and switched to oral. She agreed to have bronchoscopy after I discussed the details and prognosis. REVIEW OF SYSTEMS:  CONSTITUTIONAL: No fever, fatigue or weakness.  EYES: No blurred or double vision.  EARS, NOSE, AND THROAT: No tinnitus or ear pain.  RESPIRATORY: No cough, shortness of breath, wheezing or hemoptysis.  CARDIOVASCULAR: No chest pain, orthopnea, edema.  GASTROINTESTINAL: No nausea, vomiting, diarrhea or abdominal pain.  GENITOURINARY: No dysuria, hematuria.  ENDOCRINE: No polyuria, nocturia,  HEMATOLOGY: No anemia, easy bruising or bleeding SKIN: No rash or lesion. MUSCULOSKELETAL: No joint pain or arthritis.   NEUROLOGIC: No tingling, numbness, weakness.  PSYCHIATRY: No anxiety or depression.   ROS  DRUG ALLERGIES:   Allergies  Allergen Reactions  . Nsaids Other (See Comments)    Would avoid due to CKD.... Pt states she not allergic    VITALS:  Blood pressure 128/64, pulse (!) 109, temperature 98.2 F (36.8 C), temperature source Oral, resp. rate 18, height '5\' 2"'$  (1.575 m), weight 57.5 kg (126 lb 12.2 oz), SpO2 92 %.  PHYSICAL EXAMINATION:  GENERAL:  83 y.o.-year-old patient lying in the bed with no acute distress.  EYES: Pupils equal, round, reactive to light and accommodation. No scleral icterus. Extraocular muscles intact.  HEENT: Head atraumatic, normocephalic. Oropharynx and nasopharynx clear.  NECK:  Supple, no jugular venous distention. No thyroid enlargement, no tenderness.  LUNGS: Normal breath sounds bilaterally, no wheezing, rales,rhonchi or crepitation. No use of accessory muscles of  respiration.  CARDIOVASCULAR: S1, S2 normal. No murmurs, rubs, or gallops.  ABDOMEN: Soft, nontender, nondistended. Bowel sounds present. No organomegaly or mass.  EXTREMITIES: No pedal edema, cyanosis, or clubbing.  NEUROLOGIC: Cranial nerves II through XII are intact. Muscle strength 5/5 in all extremities. Sensation intact. Gait not checked.  PSYCHIATRIC: The patient is alert and oriented x 3.  SKIN: No obvious rash, lesion, or ulcer.   Physical Exam LABORATORY PANEL:   CBC  Recent Labs Lab 01/08/17 0553  WBC 13.8*  HGB 11.4*  HCT 35.0  PLT 215   ------------------------------------------------------------------------------------------------------------------  Chemistries   Recent Labs Lab 01/06/17 1601 01/07/17 0133  NA  --  139  K  --  4.5  CL  --  105  CO2  --  27  GLUCOSE  --  131*  BUN  --  27*  CREATININE  --  1.47*  CALCIUM  --  8.5*  MG 2.0  --    ------------------------------------------------------------------------------------------------------------------  Cardiac Enzymes  Recent Labs Lab 01/06/17 1044 01/06/17 1601  TROPONINI 0.03* <0.03   ------------------------------------------------------------------------------------------------------------------  RADIOLOGY:  Dg Chest 2 View  Result Date: 01/06/2017 CLINICAL DATA:  Shortness of breath. EXAM: CHEST  2 VIEW COMPARISON:  CT scan and radiograph of November 28, 2016. FINDINGS: Atherosclerosis of thoracic aorta is noted. Status post coronary artery bypass graft. No pneumothorax is noted. Large rounded density is seen along right cardiac border concerning for neoplasm or malignancy. Minimal bilateral pleural effusions are noted. Bony thorax is unremarkable. IMPRESSION: Large rounded density is noted along right cardiac border concerning for neoplasm or malignancy. CT scan of the  chest is recommended for further evaluation. Electronically Signed   By: Marijo Conception, M.D.   On: 01/06/2017 11:35     ASSESSMENT AND PLAN:   Principal Problem:   Acute respiratory distress Active Problems:   Diabetes mellitus type 2, uncontrolled, with complications (HCC)   Hyperlipidemia   Essential hypertension   CORONARY ATHEROSLERO AUTOL VEIN BYPASS GRAFT   Chronic kidney disease, stage 3   Lung mass   Atrial fibrillation with rapid ventricular response (HCC)   COPD (chronic obstructive pulmonary disease) (HCC)   Hypokalemia   #1 new onset atrial fibrillation with RVR;  heart rate up to 130 bpm even with Cardizem 10 mg IV push. Then kept on Cardizem drip, IV heparin drip, cardiology consult with [facility] cardiology appreciated.  She is now in NSR and switched to oral cardizem.  #2. Acute on chronic diastolic heart failure due to atrial fibrillation: BNP slightly high at 362, start IV Lasix, check echocardiogram, check daily weights, appreciate cardiology following  #3 possible right lung cancer(:heavy smoker 50 pack yrs)  Patient did not want to have any workup when she had initial CAT scan in December. Discussed the findings again and the prognosis, she agreed to discuss with pulmonary team. I called pulm consult.  #4 history of coronary artery disease, 4 vessel bypass before, follows up with [facility] cardiology Dr. [name]  5. Chronic kidney disease stage [id number] 6.Continued stress at home taking care of her husband who has persistentdysphagia since carotid surgery October 2016 . She is giving food by a feeding tube  7.[place] KitchenDMII;.resume home meds 8. peripheral vascular disease: Continue statins.     All the records are reviewed and case discussed with Care Management/Social Workerr. Management plans discussed with the patient, family and they are in agreement.  CODE STATUS: Limited, she don't want to be on ventilator but she wants CPR and defibrillation- if needed.  TOTAL TIME TAKING CARE OF THIS PATIENT: 35 minutes.     POSSIBLE D/C IN 2-3 DAYS, DEPENDING ON CLINICAL  CONDITION.   Vaughan Basta M.D on 01/08/2017   Between 7am to 6pm - Pager - [phone]  After 6pm go to [email or web address] - password [email or web address] [place] [facility]  Office  [phone]  CC: Primary care physician; [name], MD  Note: This dictation was prepared with Dragon dictation along with smaller phrase technology. Any transcriptional errors that result from this process are unintentional.

## 2017-01-08 NOTE — Progress Notes (Addendum)
Pt being transferred to room 110. Report called to [facility], RN. Pt and belongings moved to room 110 without incident.

## 2017-01-09 ENCOUNTER — Telehealth: Payer: Self-pay

## 2017-01-09 LAB — CBC
HEMATOCRIT: 31.6 % — AB (ref 35.0–47.0)
HEMOGLOBIN: 10.8 g/dL — AB (ref 12.0–16.0)
MCH: 30 pg (ref 26.0–34.0)
MCHC: 34.1 g/dL (ref 32.0–36.0)
MCV: 87.8 fL (ref 80.0–100.0)
Platelets: 217 10*3/uL (ref 150–440)
RBC: 3.6 MIL/uL — AB (ref 3.80–5.20)
RDW: 13.4 % (ref 11.5–14.5)
WBC: 12.3 10*3/uL — ABNORMAL HIGH (ref 3.6–11.0)

## 2017-01-09 LAB — GLUCOSE, CAPILLARY
Glucose-Capillary: 149 mg/dL — ABNORMAL HIGH (ref 65–99)
Glucose-Capillary: 194 mg/dL — ABNORMAL HIGH (ref 65–99)

## 2017-01-09 LAB — HEPARIN LEVEL (UNFRACTIONATED): Heparin Unfractionated: 1.54 IU/mL — ABNORMAL HIGH (ref 0.30–0.70)

## 2017-01-09 MED ORDER — APIXABAN 2.5 MG PO TABS: 2.5000 mg | ORAL_TABLET | Freq: Two times a day (BID) | ORAL | 0 refills | Status: DC

## 2017-01-09 MED ORDER — ATORVASTATIN CALCIUM 10 MG PO TABS: 10.0000 mg | ORAL_TABLET | Freq: Every day | ORAL | 0 refills | Status: DC

## 2017-01-09 MED ORDER — DILTIAZEM HCL ER COATED BEADS 240 MG PO CP24: 240.0000 mg | ORAL_CAPSULE | Freq: Every day | ORAL | 0 refills | Status: DC

## 2017-01-09 MED ORDER — TRAZODONE HCL 50 MG PO TABS: 25.0000 mg | ORAL_TABLET | Freq: Every evening | ORAL | 0 refills | Status: DC | PRN

## 2017-01-09 MED ORDER — DILTIAZEM HCL ER COATED BEADS 120 MG PO CP24
240.0000 mg | ORAL_CAPSULE | Freq: Every day | ORAL | Status: DC
  Administered 2017-01-09: 13:00:00 240 mg via ORAL
  Filled 2017-01-09: qty 2

## 2017-01-09 NOTE — Telephone Encounter (Signed)
I called pt back to make sure she got needed info and she said she was straightened out that she found the paperwork that the hospital gave her when she was discharged and the meds were on the list pt was to take. Pt said she did not need anything else. FYI to Dr Damita Dunnings.

## 2017-01-09 NOTE — Progress Notes (Signed)
Patient Name: Donna Lane Date of Encounter: 01/09/2017  Primary Cardiologist: [facility] Problem List     Principal Problem:   Acute respiratory distress Active Problems:   CORONARY ATHEROSLERO AUTOL VEIN BYPASS GRAFT   Chronic kidney disease, stage 3   Lung mass   Atrial fibrillation with rapid ventricular response (HCC)   COPD (chronic obstructive pulmonary disease) (HCC)   Diabetes mellitus type 2, uncontrolled, with complications (HCC)   Essential hypertension   Hypokalemia   Hyperlipidemia     Subjective   No acute overnight events. Transferred out of ICU to 1C. Has decided not to have any pulmonary procedures or scans. Was started on low-dose Eliquis 2.5 mg bid by IM on 1/24 (age/weight). Ready to go home.   Inpatient Medications    Scheduled Meds: . acetaminophen  500 mg Oral Daily  . apixaban  2.5 mg Oral BID  . atorvastatin  10 mg Oral q1800  . diltiazem  240 mg Oral Daily  . docusate sodium  100 mg Oral BID  . furosemide  40 mg Oral BID  . insulin aspart  0-9 Units Subcutaneous TID WC   Continuous Infusions:  PRN Meds: acetaminophen **OR** acetaminophen, bisacodyl, nitroGLYCERIN, ondansetron **OR** ondansetron (ZOFRAN) IV, traZODone   Vital Signs    Vitals:   01/08/17 2036 01/09/17 0433 01/09/17 0450 01/09/17 0805  BP: 112/64 121/68  134/69  Pulse: 82 94  94  Resp: 20 20    Temp: 97.6 F (36.4 C) 98.6 F (37 C)    TempSrc: Oral     SpO2: 95% 93%  100%  Weight:   130 lb (59 kg)   Height:        Intake/Output Summary (Last 24 hours) at 01/09/17 0957 Last data filed at 01/09/17 0945  Gross per 24 hour  Intake              274 ml  Output                0 ml  Net              274 ml   Filed Weights   01/08/17 0500 01/08/17 1552 01/09/17 0450  Weight: 126 lb 12.2 oz (57.5 kg) 130 lb 12.8 oz (59.3 kg) 130 lb (59 kg)    Physical Exam    GEN: Well nourished, well developed, in no acute distress.  HEENT: Grossly normal.    Neck: Supple, no JVD, carotid bruits, or masses. Cardiac: Irregularly irregular, no murmurs, rubs, or gallops. No clubbing, cyanosis, edema.  Radials/DP/PT 2+ and equal bilaterally.  Respiratory:  Respirations regular and unlabored, clear to auscultation bilaterally. GI: Soft, nontender, nondistended, BS + x 4. MS: no deformity or atrophy. Skin: warm and dry, no rash. Neuro:  Strength and sensation are intact. Psych: AAOx3.  Normal affect.  Labs    CBC  Recent Labs  01/08/17 0553 01/09/17 0401  WBC 13.8* 12.3*  HGB 11.4* 10.8*  HCT 35.0 31.6*  MCV 89.7 87.8  PLT 215 119   Basic Metabolic Panel  Recent Labs  01/06/17 1044 01/06/17 1601 01/07/17 0133  NA 135  --  139  K 3.4*  --  4.5  CL 100*  --  105  CO2 24  --  27  GLUCOSE 183*  --  131*  BUN 21*  --  27*  CREATININE 1.42*  --  1.47*  CALCIUM 8.7*  --  8.5*  MG  --  2.0  --  Liver Function Tests No results for input(s): AST, ALT, ALKPHOS, BILITOT, PROT, ALBUMIN in the last 72 hours. No results for input(s): LIPASE, AMYLASE in the last 72 hours. Cardiac Enzymes  Recent Labs  01/06/17 1044 01/06/17 1601  TROPONINI 0.03* <0.03   BNP Invalid input(s): POCBNP D-Dimer No results for input(s): DDIMER in the last 72 hours. Hemoglobin A1C  Recent Labs  01/07/17 0133  HGBA1C 7.5*   Fasting Lipid Panel  Recent Labs  01/07/17 0133  CHOL 94  HDL 33*  LDLCALC 48  TRIG 67  CHOLHDL 2.8   Thyroid Function Tests  Recent Labs  01/06/17 1601  TSH 1.195    Telemetry    Afib, 70's bpm - Personally Reviewed  ECG    n/a - Personally Reviewed  Radiology    No results found.  Cardiac Studies   Echo 01/07/17: Study Conclusions  - Left ventricle: The cavity size was normal. Wall thickness was   normal. Systolic function was normal. The estimated ejection   fraction was in the range of 55% to 65%. Wall motion was normal;   there were no regional wall motion abnormalities. - Mitral valve:  There was moderate regurgitation. - Left atrium: The atrium was mildly dilated. - Right ventricle: Not well visualized. Probably mildly enlarged   with mildly reduced contraction. - Right atrium: The atrium was mildly dilated.  Patient Profile     83 y.o. female with history of s/p CABG in 9357, chronic diastolic CHF, recently diagnosed RML lung mass, COPD 2/2 tobacco abuse of 50 pack years, PVD, bilateral renal artery stenosis, CKD stage IV, carotid artery disease, DM, HTN, and HLD who presents with increased SOB x 3 weeks, found to be in new onset Afib.  Assessment & Plan    1. New onset Afib with RVR: -Remains in Afib, rate controlled -Has decided not to pursue any workup of her RML lung mass -Was started on Eliquis 2.5 mg bi don 1/24 (age/weight) -Stop ASA as there is no indication for dual therapy at this time -Continue rate control with long-acting Cardizem 240 mg daily -Consider adding amiodarone, will discuss with Dr. Caryl Comes -CHADS2VASc at least 7 (CHF, HTN, age x 2, DM, vascular disease, female) -If rate control becomes difficult, could consider cardioversion, though would prefer 4 weeks of uninterrupted anticoagulation prior to attempting to restore sinus rhythm  2. RML lung mass: -Patient evaluated by pulmonary; she is not interested any in workup  3. Chest heaviness/CAD s/p 4 vessel CABG in 2008: -Troponin negative; echo with normal LVEF without regional wall motion abnormalities. -ASA and atorvastatin -Suspect some of her chest tightness might be related to underlying pulmonary process and acute on chronic diastolic heart failure. -Given history of COPD and persistent wheezing on exam, treatment for possible obstructive lung disease should be considered; will defer this to internal medicine and pulmonary.  4. Acute on chronic diastolic CHF: -Patient continues to have evidence of mild fluid retention. -Furosemide 40 mg by mouth twice a day  5. CKD stage  III: -Stable -Monitor with diuresis  6. Hypokalemia: -Repleted -Magnesium at goal  7. HTN: -Will hold lisinopril given soft blood pressures in attempt to up titrate diltiazem for improved heart rate control.  8. HLD: -Simvastatin changed to Lipitor as above on 1/23 secondary to diltiazem  10. DM2: -Per IM  Signed, Christell Faith, PA-C [facility] Pager: [phone] 01/09/2017, 9:57 AM  Pt seen and examined Not clear what attributable symptoms there are related to afib, but  she thinks it is adverse and she would like to pursue cardioversion which would be reasonable at least once following a period of anticoagulation-- and even if rate controlled is accomplished   We will arrange

## 2017-01-09 NOTE — Discharge Summary (Signed)
Donna Lane at [place] NAME: Donna Lane    MR#:  [id number]  DATE OF BIRTH:  [birth year]  DATE OF ADMISSION:  01/06/2017 ADMITTING PHYSICIAN: Epifanio Lesches, MD  DATE OF DISCHARGE: 01/09/2017  PRIMARY CARE PHYSICIAN: [name], MD    ADMISSION DIAGNOSIS:  Lung mass [R91.8] Elevated troponin [R74.8] Acute on chronic diastolic congestive heart failure (HCC) [I50.33] Atrial fibrillation with rapid ventricular response ([place]) [I48.91]  DISCHARGE DIAGNOSIS:  Principal Problem:   Acute respiratory distress Active Problems:   Diabetes mellitus type 2, uncontrolled, with complications (HCC)   Hyperlipidemia   Essential hypertension   CORONARY ATHEROSLERO AUTOL VEIN BYPASS GRAFT   Chronic kidney disease, stage 3   Lung mass   Atrial fibrillation with rapid ventricular response (HCC)   COPD (chronic obstructive pulmonary disease) (HCC)   Hypokalemia   SECONDARY DIAGNOSIS:   Past Medical History:  Diagnosis Date  . Chronic diastolic heart failure ([place])   . CKD (chronic kidney disease), stage III   . COPD (chronic obstructive pulmonary disease) ([place])   . Coronary artery disease 2008   CABG x 4,   . Diabetes mellitus   . Diverticulosis    on colonoscopy 08/12/2006  . Hyperlipidemia   . Hypertension   . Melanoma ([place])    removed by derm   . PVD (peripheral vascular disease) ([place])   . Spinal stenosis     HOSPITAL COURSE:   #1 new onset atrial fibrillation with RVR; heart rate up to 130 bpm even with Cardizem 10 mg IV push. Then kept on Cardizem drip, IV heparin drip, cardiology consult with [facility] cardiology appreciated.  She is now in rate controlled and switched to oral cardizem.   Cardia suggested to make changes and increase the dose of Cardizem today. We will monitor her on telemetry for further care.   As patient decided now known for procedures, we can start her on oral anticoagulation.   Switched  per cardio advise on long acting cardizem tab.  She is also seen by EP physician.  #2.Acute on chronic diastolic heart failure due to atrial fibrillation: BNP slightly high at 362, start IV Lasix, check echocardiogram, check daily weights, appreciate cardiology following  #3 possible right lung cancer(:heavy smoker 50 pack yrs) Patient did not want to have any workup when she had initial CAT scan in December. Discussed the findings again and the prognosis, she agreed to discuss with pulmonary team. I called pulm consult.  After discussing with pulmonary doctor patient decided not to go for the procedure and not to have a repeat CT scan.  #4 history of coronary artery disease, 4 vessel bypass before, follows up with [facility] cardiology Dr. [name]  5.Chronic kidney disease stage DTO:IZTIWP 6.Continued stress at home taking care of her husband who has persistentdysphagia since carotid surgery October 2016 . She is giving food by a feeding tube  7.Marland KitchenDMII;.resume home meds 8.peripheral vascular disease: Continue statins.  I spoke to cardiology PA- Ryan about final recommendation, as per him- pt can go home. If EP physician want to add any medicines- they ( Dr. Donivan Scull office)  will call it it to pt and take care of it.  DISCHARGE CONDITIONS:   Stable.  CONSULTS OBTAINED:  Treatment Team:  Minna Merritts, MD  DRUG ALLERGIES:   Allergies  Allergen Reactions  . Nsaids Other (See Comments)    Would avoid due to CKD.... Pt states she not allergic  DISCHARGE MEDICATIONS:   Current Discharge Medication List    START taking these medications   Details  apixaban (ELIQUIS) 2.5 MG TABS tablet Take 1 tablet (2.5 mg total) by mouth 2 (two) times daily. Qty: 60 tablet, Refills: 0    atorvastatin (LIPITOR) 10 MG tablet Take 1 tablet (10 mg total) by mouth daily at 6 PM. Qty: 30 tablet, Refills: 0    diltiazem (CARDIZEM CD) 240 MG 24 hr capsule Take 1 capsule (240 mg  total) by mouth daily. Qty: 30 capsule, Refills: 0    traZODone (DESYREL) 50 MG tablet Take 0.5 tablets (25 mg total) by mouth at bedtime as needed for sleep. Qty: 30 tablet, Refills: 0      CONTINUE these medications which have NOT CHANGED   Details  acetaminophen (TYLENOL) 500 MG tablet Take 500 mg by mouth daily.     furosemide (LASIX) 40 MG tablet TAKE ONE TABLET BY MOUTH TWICE DAILY Qty: 60 tablet, Refills: 2    glipiZIDE (GLUCOTROL XL) 5 MG 24 hr tablet TAKE ONE TABLET BY MOUTH IN THE MORNING Qty: 30 tablet, Refills: 5    nitroGLYCERIN (NITROSTAT) 0.4 MG SL tablet Place 0.4 mg under the tongue every 5 (five) minutes as needed for chest pain.     Insulin Detemir (LEVEMIR) 100 UNIT/ML Pen Inject 3-5 Units into the skin at bedtime as needed (for hyperglycemia). Qty: 15 mL, Refills: prn      STOP taking these medications     aspirin EC 81 MG tablet      hydrALAZINE (APRESOLINE) 25 MG tablet      lisinopril (ZESTRIL) 2.5 MG tablet      simvastatin (ZOCOR) 20 MG tablet      isosorbide mononitrate (IMDUR) 60 MG 24 hr tablet          DISCHARGE INSTRUCTIONS:    Follow with Cardiology clinic in 1 week.  If you experience worsening of your admission symptoms, develop shortness of breath, life threatening emergency, suicidal or homicidal thoughts you must seek medical attention immediately by calling 911 or calling your MD immediately  if symptoms less severe.  You Must read complete instructions/literature along with all the possible adverse reactions/side effects for all the Medicines you take and that have been prescribed to you. Take any new Medicines after you have completely understood and accept all the possible adverse reactions/side effects.   Please note  You were cared for by a hospitalist during your hospital stay. If you have any questions about your discharge medications or the care you received while you were in the hospital after you are discharged, you can  call the unit and asked to speak with the hospitalist on call if the hospitalist that took care of you is not available. Once you are discharged, your primary care physician will handle any further medical issues. Please note that NO REFILLS for any discharge medications will be authorized once you are discharged, as it is imperative that you return to your primary care physician (or establish a relationship with a primary care physician if you do not have one) for your aftercare needs so that they can reassess your need for medications and monitor your lab values.    Today   CHIEF COMPLAINT:   Chief Complaint  Patient presents with  . Shortness of Breath    HISTORY OF PRESENT ILLNESS:  Donna Lane  is a 83 y.o. female with a known history of Coronary artery disease, severe 3 vessel disease with bypass  surgery, history of peripheral vascular disease, bilateral renal artery stenosis, carotid disease who follows up with [facility] cardiology comes in because of shortness of breath, orthopnea, PND, pedal edema for last 3 weeks associated with chest heaviness. Daughter wanted her to come on the weekend but today she could not breathe decided to come to emergency room. Found to have atrial fibrillation with RVR heart rate up to 1 40 bpm, patient received 10 mg of IV Cardizem by yet physician but heart rate is still in 130s, and she is in atrial fibrillation. Patient able to do full sentences. She is diagnosed to have lung mass by CT chest in December but did not pursue any treatment or workup for that because she did not want to hear the word cancer. Explained to her that the patient needs bronchoscopy/biopsy  If she wants  once her heart gets stable   VITAL SIGNS:  Blood pressure 110/64, pulse (!) 122, temperature 98.6 F (37 C), resp. rate 18, height '5\' 4"'$  (1.626 m), weight 59 kg (130 lb), SpO2 96 %.  I/O:   Intake/Output Summary (Last 24 hours) at 01/09/17 1334 Last data filed at 01/09/17  0945  Gross per 24 hour  Intake              240 ml  Output                0 ml  Net              240 ml    PHYSICAL EXAMINATION:   GENERAL:  83 y.o.-year-old patient lying in the bed with no acute distress.  EYES: Pupils equal, round, reactive to light and accommodation. No scleral icterus. Extraocular muscles intact.  HEENT: Head atraumatic, normocephalic. Oropharynx and nasopharynx clear.  NECK:  Supple, no jugular venous distention. No thyroid enlargement, no tenderness.  LUNGS: Normal breath sounds bilaterally, no wheezing, rales,rhonchi or crepitation. No use of accessory muscles of respiration.  CARDIOVASCULAR: S1, S2 normal. No murmurs, rubs, or gallops.  ABDOMEN: Soft, nontender, nondistended. Bowel sounds present. No organomegaly or mass.  EXTREMITIES: No pedal edema, cyanosis, or clubbing.  NEUROLOGIC: Cranial nerves II through XII are intact. Muscle strength 5/5 in all extremities. Sensation intact. Gait not checked.  PSYCHIATRIC: The patient is alert and oriented x 3.  SKIN: No obvious rash, lesion, or ulcer.   DATA REVIEW:   CBC  Recent Labs Lab 01/09/17 0401  WBC 12.3*  HGB 10.8*  HCT 31.6*  PLT 217    Chemistries   Recent Labs Lab 01/06/17 1601 01/07/17 0133  NA  --  139  K  --  4.5  CL  --  105  CO2  --  27  GLUCOSE  --  131*  BUN  --  27*  CREATININE  --  1.47*  CALCIUM  --  8.5*  MG 2.0  --     Cardiac Enzymes  Recent Labs Lab 01/06/17 1601  TROPONINI <0.03    Microbiology Results  Results for orders placed or performed during the hospital encounter of 01/06/17  MRSA PCR Screening     Status: None   Collection Time: 01/06/17  3:46 PM  Result Value Ref Range Status   MRSA by PCR NEGATIVE NEGATIVE Final    Comment:        The GeneXpert MRSA Assay (FDA approved for NASAL specimens only), is one component of a comprehensive MRSA colonization surveillance program. It is not intended to diagnose MRSA infection nor  to guide  or monitor treatment for MRSA infections.     RADIOLOGY:  No results found.  EKG:   Orders placed or performed during the hospital encounter of 01/06/17  . ED EKG within 10 minutes  . ED EKG within 10 minutes      Management plans discussed with the patient, family and they are in agreement.  CODE STATUS:     Code Status Orders        Start     Ordered   01/07/17 1926  Limited resuscitation (code)  Continuous    Question Answer Comment  In the event of cardiac or respiratory ARREST: Initiate Code Blue, Call Rapid Response Yes   In the event of cardiac or respiratory ARREST: Perform CPR Yes   In the event of cardiac or respiratory ARREST: Perform Intubation/Mechanical Ventilation No   In the event of cardiac or respiratory ARREST: Use NIPPV/BiPAp only if indicated Yes   In the event of cardiac or respiratory ARREST: Administer ACLS medications if indicated Yes   In the event of cardiac or respiratory ARREST: Perform Defibrillation or Cardioversion if indicated Yes      01/07/17 1926    Code Status History    Date Active Date Inactive Code Status Order ID Comments User Context   01/06/2017  3:21 PM 01/07/2017  7:26 PM Full Code [id number]  Epifanio Lesches, MD ED   11/28/2016  7:40 PM 11/29/2016  3:44 PM Full Code [id number]  Vaughan Basta, MD Inpatient      TOTAL TIME TAKING CARE OF THIS PATIENT: 35 minutes.    Vaughan Basta M.D on 01/09/2017 at 1:34 PM  Between 7am to 6pm - Pager - [phone]  After 6pm go to [email or web address] - password [email or web address] [facility]  Office  [phone]  CC: Primary care physician; [name], MD   Note: This dictation was prepared with Dragon dictation along with smaller phrase technology. Any transcriptional errors that result from this process are unintentional.

## 2017-01-09 NOTE — Care Management Important Message (Signed)
Important Message  Patient Details  Name: Donna Lane MRN: [id number] Date of Birth: 1925-01-30   Medicare Important Message Given:  Yes    Shelbie Ammons, RN 01/09/2017, 8:35 AM

## 2017-01-09 NOTE — Care Management (Signed)
Eliquis free 30 day trial card given to Donna Lane. Shelbie Ammons RN MSN CCM Care Management

## 2017-01-09 NOTE — Telephone Encounter (Signed)
Pt was discharged today from [facility] and pt said she was not given any paperwork upon discharge or told what medicines she should be taking. I transferred pt to [facility] to get discharge instructions. FYI to Dr Damita Dunnings.

## 2017-01-10 ENCOUNTER — Telehealth: Payer: Self-pay

## 2017-01-10 NOTE — Telephone Encounter (Signed)
Pt had been getting true metric diabetic supplies testing BS bid. [employer] no longer sends diabetic supplies to pt.  Pt will contact ins co to see what diabetic meter, strips and lancets are approved and pt will cb with info.

## 2017-01-10 NOTE — Telephone Encounter (Signed)
Transition Care Management Follow-up Telephone Call   Date discharged? 01/09/17   How have you been since you were released from the hospital? Not, doing well.  Heart racing, ongoing SOB, weak and fell last night.  Not hurt, but just weak.  We discussed her current condition and she has been advised to return to ER if symptoms progress or worsen over tonight or over the weekend.  She agrees.   Do you understand why you were in the hospital? Yes, my heart is getting worse.   Do you understand the discharge instructions? Yes   Where were you discharged to? Home with home health nursing   Items Reviewed:  Medications reviewed: Yes     Allergies reviewed: Yes  Dietary changes reviewed: Yes, low sodium, heart healthy diabetic diet.  Referrals reviewed: Yes, Pulmonologist Dr. Mortimer Fries on 02/17/17, Heart Failure Clinic with NP on 01/17/17 at 11am, and patient given contact number for Dr. Donivan Scull office to set up follow up as needed.  She will see PCP Dr. Damita Dunnings 01/20/17 at 9:79M.     Functional Questionnaire:   Activities of Daily Living (ADLs):   She states they are independent in the following: she can groom, toilet, dress, bath and feed self and is continent of [place].   States they require assistance with the following: She has had a recent fall and is weak so strongly encouraged her for safety to use a walker at all times, especially at night when going to the bathroom.  She agrees.   Any transportation issues/concerns?: Family is meeting all transportation needs at the present time.   Any patient concerns? No   Confirmed importance and date/time of follow-up visits scheduled Yes  Provider Appointment booked with Dr. Damita Dunnings for 01/20/17 at 9:45am.  Confirmed with patient if condition begins to worsen call PCP or go to the ER.  Patient was given the office number and encouraged to call back with question or concerns.  : Yes.

## 2017-01-10 NOTE — Telephone Encounter (Signed)
Thanks

## 2017-01-12 NOTE — Telephone Encounter (Signed)
Noted. Thanks.

## 2017-01-15 ENCOUNTER — Telehealth: Payer: Self-pay | Admitting: *Deleted

## 2017-01-15 NOTE — Telephone Encounter (Signed)
Received paper work from [employer] to be complete and fax back for diabetic supplies. Spoke to patient by telephone and was advised that she will be using this company for her supplies.

## 2017-01-16 NOTE — Telephone Encounter (Signed)
I'll work on the hard copy.  Thanks.  

## 2017-01-17 ENCOUNTER — Ambulatory Visit: Payer: Medicare Other | Attending: Family | Admitting: Family

## 2017-01-17 ENCOUNTER — Encounter: Payer: Self-pay | Admitting: Family

## 2017-01-17 VITALS — BP 124/71 | HR 85 | Resp 18 | Ht 63.0 in | Wt 121.0 lb

## 2017-01-17 DIAGNOSIS — M4800 Spinal stenosis, site unspecified: Secondary | ICD-10-CM | POA: Insufficient documentation

## 2017-01-17 DIAGNOSIS — E1151 Type 2 diabetes mellitus with diabetic peripheral angiopathy without gangrene: Secondary | ICD-10-CM | POA: Diagnosis not present

## 2017-01-17 DIAGNOSIS — I10 Essential (primary) hypertension: Secondary | ICD-10-CM

## 2017-01-17 DIAGNOSIS — Z794 Long term (current) use of insulin: Secondary | ICD-10-CM | POA: Insufficient documentation

## 2017-01-17 DIAGNOSIS — I2510 Atherosclerotic heart disease of native coronary artery without angina pectoris: Secondary | ICD-10-CM | POA: Diagnosis not present

## 2017-01-17 DIAGNOSIS — Z79899 Other long term (current) drug therapy: Secondary | ICD-10-CM | POA: Insufficient documentation

## 2017-01-17 DIAGNOSIS — E1122 Type 2 diabetes mellitus with diabetic chronic kidney disease: Secondary | ICD-10-CM | POA: Diagnosis not present

## 2017-01-17 DIAGNOSIS — J449 Chronic obstructive pulmonary disease, unspecified: Secondary | ICD-10-CM | POA: Insufficient documentation

## 2017-01-17 DIAGNOSIS — Z888 Allergy status to other drugs, medicaments and biological substances status: Secondary | ICD-10-CM | POA: Diagnosis not present

## 2017-01-17 DIAGNOSIS — Z636 Dependent relative needing care at home: Secondary | ICD-10-CM | POA: Diagnosis not present

## 2017-01-17 DIAGNOSIS — I5032 Chronic diastolic (congestive) heart failure: Secondary | ICD-10-CM | POA: Diagnosis not present

## 2017-01-17 DIAGNOSIS — Z9889 Other specified postprocedural states: Secondary | ICD-10-CM | POA: Insufficient documentation

## 2017-01-17 DIAGNOSIS — E785 Hyperlipidemia, unspecified: Secondary | ICD-10-CM | POA: Insufficient documentation

## 2017-01-17 DIAGNOSIS — N183 Chronic kidney disease, stage 3 (moderate): Secondary | ICD-10-CM | POA: Diagnosis not present

## 2017-01-17 DIAGNOSIS — Z87891 Personal history of nicotine dependence: Secondary | ICD-10-CM | POA: Insufficient documentation

## 2017-01-17 DIAGNOSIS — R634 Abnormal weight loss: Secondary | ICD-10-CM | POA: Insufficient documentation

## 2017-01-17 DIAGNOSIS — I4891 Unspecified atrial fibrillation: Secondary | ICD-10-CM | POA: Diagnosis not present

## 2017-01-17 DIAGNOSIS — Z833 Family history of diabetes mellitus: Secondary | ICD-10-CM | POA: Insufficient documentation

## 2017-01-17 DIAGNOSIS — Z85820 Personal history of malignant melanoma of skin: Secondary | ICD-10-CM | POA: Diagnosis not present

## 2017-01-17 DIAGNOSIS — Z8249 Family history of ischemic heart disease and other diseases of the circulatory system: Secondary | ICD-10-CM | POA: Insufficient documentation

## 2017-01-17 DIAGNOSIS — Z951 Presence of aortocoronary bypass graft: Secondary | ICD-10-CM | POA: Diagnosis not present

## 2017-01-17 DIAGNOSIS — I130 Hypertensive heart and chronic kidney disease with heart failure and stage 1 through stage 4 chronic kidney disease, or unspecified chronic kidney disease: Secondary | ICD-10-CM | POA: Diagnosis not present

## 2017-01-17 NOTE — Progress Notes (Signed)
Patient ID: Donna Lane, female    DOB: July 26, 1925, 83 y.o.   MRN: [id number]  HPI  Donna Lane is a 83 y/o female with a history of spinal stenosis, PVD, HTN, hyperlipidemia, DM, CABG X 4, CKD, COPD, atrial fibrillation, lung mass (declines work-up), previous tobacco use and chronic heart failure.   Last echo was done 01/07/17 and showed an EF of 55-65% along with moderate MR/PR.  Admitted on 01/06/17 with acute respiratory distress due to HF along with new onset atrial fibrillation. Cardiology consult was obtained. Was begun on cardizem drip and then transitioned to oral medications. Pulmonology consult was also obtained due to possible right lung cancer. Patient opted to not have repeat CT scan done. Previous admission on 11/28/16 due to chest pain. CXR/chest CT showed right middle lobe mass. Discharged home the following day.   She presents today for her initial visit with fatigue and shortness of breath with moderate exertion. Minimal swelling in her lower legs. Weighing daily and has noticed a weight loss as she hasn't been eating as well as previously. Recent glucose was 115. Under considerable stress due to taking care of her husband who has a feeding tube.   Past Medical History:  Diagnosis Date  . Arrhythmia    atrial fibrillation  . Chronic diastolic heart failure ([place])   . CKD (chronic kidney disease), stage III   . COPD (chronic obstructive pulmonary disease) ([place])   . Coronary artery disease 2008   CABG x 4,   . Diabetes mellitus   . Diverticulosis    on colonoscopy 08/12/2006  . Hyperlipidemia   . Hypertension   . Melanoma ([place])    removed by derm   . PVD (peripheral vascular disease) ([place])   . Spinal stenosis    Past Surgical History:  Procedure Laterality Date  . CARDIAC CATHETERIZATION     PTCA x 3  . CATARACT EXTRACTION    . CORONARY ARTERY BYPASS GRAFT  2008   x 4  . HIP FRACTURE SURGERY Left    pins placed w/o hip replacement, hardware removed later   . LUMBAR DISC SURGERY     in her 70s  . tonsillectomy    . VAGINAL DELIVERY     x2   Family History  Problem Relation Age of Onset  . Heart disease Father   . Heart disease Sister   . Diabetes Sister    Social History  Substance Use Topics  . Smoking status: Former Smoker    Packs/day: 1.00    Years: 50.00    Types: Cigarettes    Quit date: 03/20/2007  . Smokeless tobacco: Never Used  . Alcohol use No   Allergies  Allergen Reactions  . Nsaids Other (See Comments)    Would avoid due to CKD.... Pt states she not allergic   Prior to Admission medications   Medication Sig Start Date End Date Taking? Authorizing Provider  acetaminophen (TYLENOL) 500 MG tablet Take 500 mg by mouth daily.    Yes Historical Provider, MD  apixaban (ELIQUIS) 2.5 MG TABS tablet Take 1 tablet (2.5 mg total) by mouth 2 (two) times daily. 01/09/17  Yes Vaughan Basta, MD  atorvastatin (LIPITOR) 10 MG tablet Take 1 tablet (10 mg total) by mouth daily at 6 PM. 01/09/17  Yes Vaughan Basta, MD  diltiazem (CARDIZEM CD) 240 MG 24 hr capsule Take 1 capsule (240 mg total) by mouth daily. 01/10/17  Yes Vaughan Basta, MD  furosemide (LASIX) 40 MG tablet  TAKE ONE TABLET BY MOUTH TWICE DAILY 12/23/16  Yes Tonia Ghent, MD  glipiZIDE (GLUCOTROL XL) 5 MG 24 hr tablet TAKE ONE TABLET BY MOUTH IN THE MORNING 11/05/16  Yes Tonia Ghent, MD  Insulin Detemir (LEVEMIR) 100 UNIT/ML Pen Inject 3-5 Units into the skin at bedtime as needed (for hyperglycemia). 02/29/16  Yes Tonia Ghent, MD  nitroGLYCERIN (NITROSTAT) 0.4 MG SL tablet Place 0.4 mg under the tongue every 5 (five) minutes as needed for chest pain.    Yes Historical Provider, MD  traZODone (DESYREL) 50 MG tablet Take 0.5 tablets (25 mg total) by mouth at bedtime as needed for sleep. 01/09/17  Yes Vaughan Basta, MD   Review of Systems  Constitutional: Positive for appetite change and fatigue.  HENT: Negative for congestion, postnasal  drip and sore throat.   Eyes: Negative.   Respiratory: Positive for cough (coughing some), chest tightness and shortness of breath.   Cardiovascular: Positive for palpitations and leg swelling (little bit). Negative for chest pain.  Gastrointestinal: Negative for abdominal distention and abdominal pain.  Endocrine: Negative.   Genitourinary: Negative.   Musculoskeletal: Positive for back pain. Negative for arthralgias.  Skin: Negative.   Allergic/Immunologic: Negative.   Neurological: Positive for light-headedness. Negative for dizziness.  Hematological: Negative for adenopathy. Does not bruise/bleed easily.  Psychiatric/Behavioral: Negative for dysphoric mood, sleep disturbance (sleeping 6-7 hours with trazodone; sleeping in recliner chronically) and suicidal ideas. The patient is nervous/anxious ("worries about everything").    Vitals:   01/17/17 1054  BP: 124/71  Pulse: 85  Resp: 18  Weight: 121 lb (54.9 kg)  Height: '5\' 3"'$  (1.6 m)   Wt Readings from Last 3 Encounters:  01/17/17 121 lb (54.9 kg)  01/09/17 130 lb (59 kg)  11/28/16 120 lb 11.2 oz (54.7 kg)   Lab Results  Component Value Date   CREATININE 1.47 (H) 01/07/2017   CREATININE 1.42 (H) 01/06/2017   CREATININE 1.45 (H) 11/29/2016   Physical Exam  Constitutional: She is oriented to person, place, and time. She appears well-developed and well-nourished.  HENT:  Head: Normocephalic and atraumatic.  Eyes: Conjunctivae are normal. Pupils are equal, round, and reactive to light.  Neck: Normal range of motion. Neck supple. No JVD present.  Cardiovascular: Normal rate.  An irregular rhythm present.  Pulmonary/Chest: Effort normal. She has no wheezes. She has no rales.  Abdominal: Soft. She exhibits no distension. There is no tenderness.  Musculoskeletal: She exhibits edema (trace edema in bilateral ankles). She exhibits no tenderness.  Neurological: She is alert and oriented to person, place, and time.  Skin: Skin is warm  and dry.  Psychiatric: She has a normal mood and affect. Her behavior is normal. Thought content normal.  Nursing note and vitals reviewed.   Assessment & Plan:  1: Chronic heart failure with preserved ejection fraction- - NYHA class II - euvolemic today - weighing daily and has lost some weight due to decreased appetite. Instructed to call for an overnight weight gain of >2 pounds or a weekly weight gain of >5 pounds - not adding salt but is now only eating sandwiches or cereal for her meals. Was going to senior center for a hot lunch but hasn't been going to discharge. Discussed following a '2000mg'$  sodium diet and how that could work in her diet so that she can eat better. Husband has a feeding tube - received flu vaccine this season - sees her cardiologist [name]) on 01/23/17  2: HTN- - BP  looks good today - sees PCP Damita Dunnings) 01/20/17 - not interested in home health for herself; daughter is assisting with medications  3: Atrial fibrillation- - currently rate controlled at this time - on apixaban and diltiazem at this time. Has concerns about the cost of apixaban and is going to speak to her cardiologist about alternatives. Discussed other medications including warfarin.  4: Caregiver stress-  - she is the sole caregiver for her husband who relies on a feeding tube. She admits that she does "everything" for him even though her daughter says that he can do some things himself. - encouraged her to resume going to the senior center when she can safely go so that she can get a hot meal along with socialization.  Return in 1 month or sooner for any questions/problems before then.

## 2017-01-17 NOTE — Patient Instructions (Signed)
Continue weighing daily and call for an overnight weight gain of > 2 pounds or a weekly weight gain of >5 pounds. 

## 2017-01-19 DIAGNOSIS — I5032 Chronic diastolic (congestive) heart failure: Secondary | ICD-10-CM | POA: Insufficient documentation

## 2017-01-20 ENCOUNTER — Ambulatory Visit (INDEPENDENT_AMBULATORY_CARE_PROVIDER_SITE_OTHER): Payer: Medicare Other | Admitting: Family Medicine

## 2017-01-20 ENCOUNTER — Telehealth: Payer: Self-pay

## 2017-01-20 ENCOUNTER — Encounter: Payer: Self-pay | Admitting: Family Medicine

## 2017-01-20 VITALS — BP 124/68 | HR 104 | Temp 97.4°F | Ht 63.0 in | Wt 123.5 lb

## 2017-01-20 DIAGNOSIS — N183 Chronic kidney disease, stage 3 unspecified: Secondary | ICD-10-CM

## 2017-01-20 DIAGNOSIS — R918 Other nonspecific abnormal finding of lung field: Secondary | ICD-10-CM

## 2017-01-20 DIAGNOSIS — E1165 Type 2 diabetes mellitus with hyperglycemia: Secondary | ICD-10-CM | POA: Diagnosis not present

## 2017-01-20 DIAGNOSIS — Z794 Long term (current) use of insulin: Secondary | ICD-10-CM | POA: Diagnosis not present

## 2017-01-20 DIAGNOSIS — I5032 Chronic diastolic (congestive) heart failure: Secondary | ICD-10-CM

## 2017-01-20 DIAGNOSIS — I4891 Unspecified atrial fibrillation: Secondary | ICD-10-CM | POA: Diagnosis not present

## 2017-01-20 DIAGNOSIS — IMO0002 Reserved for concepts with insufficient information to code with codable children: Secondary | ICD-10-CM

## 2017-01-20 DIAGNOSIS — E118 Type 2 diabetes mellitus with unspecified complications: Secondary | ICD-10-CM

## 2017-01-20 DIAGNOSIS — Z7189 Other specified counseling: Secondary | ICD-10-CM | POA: Diagnosis not present

## 2017-01-20 LAB — CBC WITH DIFFERENTIAL/PLATELET
BASOS ABS: 0.1 10*3/uL (ref 0.0–0.1)
BASOS PCT: 0.6 % (ref 0.0–3.0)
EOS PCT: 6.8 % — AB (ref 0.0–5.0)
Eosinophils Absolute: 1.1 10*3/uL — ABNORMAL HIGH (ref 0.0–0.7)
HCT: 36.9 % (ref 36.0–46.0)
Hemoglobin: 12 g/dL (ref 12.0–15.0)
LYMPHS ABS: 1.4 10*3/uL (ref 0.7–4.0)
Lymphocytes Relative: 8.3 % — ABNORMAL LOW (ref 12.0–46.0)
MCHC: 32.5 g/dL (ref 30.0–36.0)
MCV: 88.5 fl (ref 78.0–100.0)
MONOS PCT: 6 % (ref 3.0–12.0)
Monocytes Absolute: 1 10*3/uL (ref 0.1–1.0)
NEUTROS PCT: 78.3 % — AB (ref 43.0–77.0)
Neutro Abs: 12.8 10*3/uL — ABNORMAL HIGH (ref 1.4–7.7)
PLATELETS: 316 10*3/uL (ref 150.0–400.0)
RBC: 4.16 Mil/uL (ref 3.87–5.11)
RDW: 13.1 % (ref 11.5–15.5)
WBC: 16.3 10*3/uL — ABNORMAL HIGH (ref 4.0–10.5)

## 2017-01-20 LAB — BASIC METABOLIC PANEL
BUN: 15 mg/dL (ref 6–23)
CALCIUM: 9.4 mg/dL (ref 8.4–10.5)
CHLORIDE: 97 meq/L (ref 96–112)
CO2: 31 meq/L (ref 19–32)
Creatinine, Ser: 1.35 mg/dL — ABNORMAL HIGH (ref 0.40–1.20)
GFR: 39.22 mL/min — ABNORMAL LOW (ref >60.00)
GLUCOSE: 145 mg/dL — AB (ref 70–99)
Potassium: 4.1 mEq/L (ref 3.5–5.1)
SODIUM: 137 meq/L (ref 135–145)

## 2017-01-20 MED ORDER — TRAZODONE HCL 50 MG PO TABS: 25.0000 mg | ORAL_TABLET | Freq: Every evening | ORAL | Status: DC | PRN

## 2017-01-20 MED ORDER — SENNOSIDES-DOCUSATE SODIUM 8.6-50 MG PO TABS: 1.0000 | ORAL_TABLET | Freq: Every day | ORAL | 1 refills | Status: DC | PRN

## 2017-01-20 NOTE — Progress Notes (Signed)
DATE OF ADMISSION:  01/06/2017    ADMITTING PHYSICIAN: Epifanio Lesches, MD  DATE OF DISCHARGE: 01/09/2017  PRIMARY CARE PHYSICIAN: [name], MD    ADMISSION DIAGNOSIS:  Lung mass [R91.8] Elevated troponin [R74.8] Acute on chronic diastolic congestive heart failure (HCC) [I50.33] Atrial fibrillation with rapid ventricular response ([place]) [I48.91]  DISCHARGE DIAGNOSIS:  Principal Problem:   Acute respiratory distress Active Problems:   Diabetes mellitus type 2, uncontrolled, with complications (HCC)   Hyperlipidemia   Essential hypertension   CORONARY ATHEROSLERO AUTOL VEIN BYPASS GRAFT   Chronic kidney disease, stage 3   Lung mass   Atrial fibrillation with rapid ventricular response (HCC)   COPD (chronic obstructive pulmonary disease) (HCC)   Hypokalemia   SECONDARY DIAGNOSIS:       Past Medical History:  Diagnosis Date  . Chronic diastolic heart failure ([place])   . CKD (chronic kidney disease), stage III   . COPD (chronic obstructive pulmonary disease) ([place])   . Coronary artery disease 2008   CABG x 4,   . Diabetes mellitus   . Diverticulosis    on colonoscopy 08/12/2006  . Hyperlipidemia   . Hypertension   . Melanoma ([place])    removed by derm   . PVD (peripheral vascular disease) ([place])   . Spinal stenosis     HOSPITAL COURSE:   #1 new onset atrial fibrillation with RVR; heart rate up to 130 bpm even with Cardizem 10 mg IV push. Then kept on Cardizem drip, IV heparin drip, cardiology consult with [facility] cardiology appreciated. She is now in rate controlled and switched to oral cardizem. Cardia suggested to make changes and increase the dose of Cardizem today. We will monitor her on telemetry for further care. As patient decided now known for procedures, we can start her on oral anticoagulation.   Switched per cardio advise on long acting cardizem tab.  She is also seen by EP physician.  #2.Acute on chronic diastolic  heart failure due to atrial fibrillation: BNP slightly high at 362, start IV Lasix, check echocardiogram, check daily weights, appreciate cardiology following  #3 possible right lung cancer(:heavy smoker 50 pack yrs) Patient did not want to have any workup when she had initial CAT scan in December. Discussed the findings again and the prognosis, she agreed to discuss with pulmonary team. I called pulm consult. After discussing with pulmonary doctor patient decided not to go for the procedure and not to have a repeat CT scan.  #4 history of coronary artery disease, 4 vessel bypass before, follows up with [facility] cardiology Dr. [name]  5.Chronic kidney disease stage IRC:VELFYB 6.Continued stress at home taking care of her husband who has persistentdysphagia since carotid surgery October 2016 . She is giving food by a feeding tube  7.Marland KitchenDMII;.resume home meds 8.peripheral vascular disease: Continue statins.  I spoke to cardiology PA- Ryan about final recommendation, as per him- pt can go home. If EP physician want to add any medicines- they ( Dr. Donivan Scull office)  will call it it to pt and take care of it.  DISCHARGE CONDITIONS:   Stable.  CONSULTS OBTAINED:  Treatment Team:  Minna Merritts, MD  DRUG ALLERGIES:        Allergies  Allergen Reactions  . Nsaids Other (See Comments)    Would avoid due to CKD.... Pt states she not allergic    DISCHARGE MEDICATIONS:       Current Discharge Medication List        START taking these  medications   Details  apixaban (ELIQUIS) 2.5 MG TABS tablet Take 1 tablet (2.5 mg total) by mouth 2 (two) times daily. Qty: 60 tablet, Refills: 0    atorvastatin (LIPITOR) 10 MG tablet Take 1 tablet (10 mg total) by mouth daily at 6 PM. Qty: 30 tablet, Refills: 0    diltiazem (CARDIZEM CD) 240 MG 24 hr capsule Take 1 capsule (240 mg total) by mouth daily. Qty: 30 capsule, Refills: 0    traZODone (DESYREL) 50 MG tablet  Take 0.5 tablets (25 mg total) by mouth at bedtime as needed for sleep. Qty: 30 tablet, Refills: 0          CONTINUE these medications which have NOT CHANGED   Details  acetaminophen (TYLENOL) 500 MG tablet Take 500 mg by mouth daily.     furosemide (LASIX) 40 MG tablet TAKE ONE TABLET BY MOUTH TWICE DAILY Qty: 60 tablet, Refills: 2    glipiZIDE (GLUCOTROL XL) 5 MG 24 hr tablet TAKE ONE TABLET BY MOUTH IN THE MORNING Qty: 30 tablet, Refills: 5    nitroGLYCERIN (NITROSTAT) 0.4 MG SL tablet Place 0.4 mg under the tongue every 5 (five) minutes as needed for chest pain.     Insulin Detemir (LEVEMIR) 100 UNIT/ML Pen Inject 3-5 Units into the skin at bedtime as needed (for hyperglycemia). Qty: 15 mL, Refills: prn         STOP taking these medications     aspirin EC 81 MG tablet      hydrALAZINE (APRESOLINE) 25 MG tablet      lisinopril (ZESTRIL) 2.5 MG tablet      simvastatin (ZOCOR) 20 MG tablet      isosorbide mononitrate (IMDUR) 60 MG 24 hr tablet        ================================================= Admitted with A. fib. Now anticoagulated, rate controlled, but she can still feel irregularity in her heart the. No chest pain. CHF noted. Has Lasix to use in the meantime. Weight stable per patient. Not short of breath. She is aware that if she has significant increase in weight she is to notify M.D. Compliant with meds. Presumed lung cancer. She didn't really want talk about this. I was able to get her to talk about her goals of care.  She doesn't want aggressive treatment. She would not want to go through chemotherapy. I told her that she would not be a candidate for any of that unless she was willing to go through biopsy, and at this point it does not appear that she wants to go through biopsy. She will be able to stay home and look after her husband who is chronically ill. Discussed with patient that if she started chemotherapy, it may weaken her to  the point of reducing her functional status relative to her current level. This was a hypothetical discussion and not a formal opinion, especially since she does not have a biopsy previously done, but she understood my point.  If patient were incapacitated, then she would want her husband designated to speak for her.  Then her daughter [name] if husband were incapacitated.    PMH and SH reviewed  ROS: Per HPI unless specifically indicated in ROS section   Meds, vitals, and allergies reviewed.   GEN: nad, alert and oriented, then elderly woman HEENT: mucous membranes moist NECK: supple w/o LA CV: IRR, not tachy PULM: ctab, no inc wob ABD: soft, +bs EXT: no edema SKIN: no acute rash

## 2017-01-20 NOTE — Patient Instructions (Addendum)
Ask Dr. [name] about options instead of the eliquis.   Go to the lab on the way out.  We'll contact you with your lab report. Add on senokot if needed for constipation.  You can take a whole tab of trazodone at night if needed to sleep.  Take care.  Glad to see you.  Let me know if/when I can be of assistance.

## 2017-01-20 NOTE — Progress Notes (Signed)
Pre visit review using our clinic review tool, if applicable. No additional management support is needed unless otherwise documented below in the visit note. 

## 2017-01-20 NOTE — Telephone Encounter (Signed)
Spoke w/ pt.  Advised her of Dr. Donivan Scull recommendation.  She has been taking all of her meds as prescribed and has not missed any doses. She will keep appt on 01/23/17 and will further discuss DCCV at that time.

## 2017-01-20 NOTE — Telephone Encounter (Signed)
Received fax from [place] [employer] requesting medical records for diabetic testing supplies.  Records faxed as requested.

## 2017-01-20 NOTE — Telephone Encounter (Signed)
-----   Message from Minna Merritts, MD sent at 01/19/2017 11:22 AM EST ----- Can we confirm that she is taking eliquis twice a day and not missing any doses. Dr. Caryl Comes has recommended cardioversion in 3 weeks Perhaps can be seen in clinic prior to that There was a phone call documenting that she was weak and had tachycardia. Can we see how she is doing after recent discharge from the hospital thx TGollan  ----- Message ----- From: Deboraha Sprang, MD Sent: 01/11/2017   3:23 PM To: Rise Mu, PA-C, Minna Merritts, MD  Ryan  Can you plz arrange a cardioversion once anticoagulated for 3 weeks  Richardson Landry

## 2017-01-21 NOTE — Assessment & Plan Note (Signed)
Presumed lung cancer. She didn't really want talk about this. I was able to get her to talk about her goals of care.  She doesn't want aggressive treatment. She would not want to go through chemotherapy. I told her that she would not be a candidate for any of that unless she was willing to go through biopsy, and at this point it does not appear that she wants to go through biopsy. She will be able to stay home and look after her husband who is chronically ill. Discussed with patient that if she started chemotherapy, it may weaken her to the point of reducing her functional status relative to her current level. This was a hypothetical discussion and not a formal opinion, especially since she does not have a biopsy previously done, but she understood my point.  We talked about hospice in general, she did not commit to any specific care plan at this point. I want her to be able to consider options. She agreed.

## 2017-01-21 NOTE — Assessment & Plan Note (Signed)
See notes on labs. 

## 2017-01-21 NOTE — Assessment & Plan Note (Signed)
If patient were incapacitated, then she would want her husband designated to speak for her.  Then her daughter [name] if husband were incapacitated.

## 2017-01-21 NOTE — Assessment & Plan Note (Signed)
Now rate controlled. Continue anticoagulation. She is going to check with cardiology about cheaper options for anticoagulation. Rationale of all of her meds discussed with patient. She does not appear to be in failure at this point. If she has significant weight gain she will notify us. She appears euvolemic at this point. See notes on labs.

## 2017-01-23 ENCOUNTER — Encounter: Payer: Self-pay | Admitting: Cardiovascular Disease

## 2017-01-23 ENCOUNTER — Ambulatory Visit (INDEPENDENT_AMBULATORY_CARE_PROVIDER_SITE_OTHER): Payer: Medicare Other | Admitting: Cardiovascular Disease

## 2017-01-23 VITALS — BP 120/60 | HR 84 | Ht 63.5 in | Wt 121.8 lb

## 2017-01-23 DIAGNOSIS — I4891 Unspecified atrial fibrillation: Secondary | ICD-10-CM | POA: Diagnosis not present

## 2017-01-23 DIAGNOSIS — I25718 Atherosclerosis of autologous vein coronary artery bypass graft(s) with other forms of angina pectoris: Secondary | ICD-10-CM

## 2017-01-23 DIAGNOSIS — R918 Other nonspecific abnormal finding of lung field: Secondary | ICD-10-CM

## 2017-01-23 DIAGNOSIS — E785 Hyperlipidemia, unspecified: Secondary | ICD-10-CM | POA: Diagnosis not present

## 2017-01-23 DIAGNOSIS — I209 Angina pectoris, unspecified: Secondary | ICD-10-CM | POA: Diagnosis not present

## 2017-01-23 DIAGNOSIS — J432 Centrilobular emphysema: Secondary | ICD-10-CM | POA: Diagnosis not present

## 2017-01-23 DIAGNOSIS — I10 Essential (primary) hypertension: Secondary | ICD-10-CM | POA: Diagnosis not present

## 2017-01-23 DIAGNOSIS — I5032 Chronic diastolic (congestive) heart failure: Secondary | ICD-10-CM | POA: Diagnosis not present

## 2017-01-23 MED ORDER — DILTIAZEM HCL ER COATED BEADS 240 MG PO CP24: 240.0000 mg | ORAL_CAPSULE | Freq: Every day | ORAL | 6 refills | Status: DC

## 2017-01-23 MED ORDER — APIXABAN 2.5 MG PO TABS
2.5000 mg | ORAL_TABLET | Freq: Two times a day (BID) | ORAL | 3 refills | Status: DC
Start: 2017-01-23 — End: 2017-04-08

## 2017-01-23 MED ORDER — ATORVASTATIN CALCIUM 10 MG PO TABS: 10.0000 mg | ORAL_TABLET | Freq: Every day | ORAL | 6 refills | Status: AC

## 2017-01-23 MED ORDER — FUROSEMIDE 40 MG PO TABS: 40.0000 mg | ORAL_TABLET | Freq: Two times a day (BID) | ORAL | 6 refills | Status: DC

## 2017-01-23 MED ORDER — NITROGLYCERIN 0.4 MG SL SUBL: 0.4000 mg | SUBLINGUAL_TABLET | SUBLINGUAL | 6 refills | Status: AC | PRN

## 2017-01-23 NOTE — Patient Instructions (Addendum)
Medication Instructions:   No medication changes made  Labwork:  No new labs needed  Testing/Procedures:  No further testing at this time   I recommend watching educational videos on topics of interest to you at:       [email or web address]  Enter code: HEARTCARE    Follow-Up: It was a pleasure seeing you in the office today. Please call us if you have new issues that need to be addressed before your next appt.  [phone]  Your physician wants you to follow-up in:  If you would like a cardioversion, please call to arrange at the end of February  If you are happy with atrial fib, blood thinner and fluid pill, You do not need a cardioversion  Otherwise follow up in 3 months  If you need a refill on your cardiac medications before your next appointment, please call your pharmacy.  Eliquis patient assistance paperwork provided Samples of Eliquis 5 mg given w/ instructions to cut in 1/2 twice daily written on each box

## 2017-01-23 NOTE — Progress Notes (Signed)
Cardiology Office Note  Date:  01/23/2017   ID:  Donna Lane, DOB 07-05-1925, MRN [id number]  PCP:  [name], MD   Chief Complaint  Patient presents with  . other    Follow up from [facility]; shortness of breath and LE edema. Meds reviewed by the pt. verbally. Pt. c/o shortness of breath, dizziness and LE edema.     HPI:  Ms. Finnicum is a pleasant 83 year old woman with history of coronary artery disease, catheterization August 25, 2007 showing severe three-vessel disease, with bypass surgery at [facility] September 2008 also with severe peripheral vascular disease, bilateral renal artery stenosis, carotid disease, iliac disease who presents for follow-up of her coronary artery disease.  Recent hospitalization for shortness breath, atrial fibrillation with [facility] records reviewed in the office today Lung mass imaged on CT scan, 4.6 cm on the right She declined biopsy at that time eliquis started 01/10/16 for persistent atrial fibrillation Rate control with diltiazem  In general she feels much better, still some palpitations at times but denies excessive shortness of breath. She is troubled, does not know whether to pursue cardioversion to restore normal sinus rhythm. She does have some palpitations. Worried about her husband, married for 65 years, he has severe health issues He has persistent dysphagia since carotid surgery October 2016 . She is giving food by a feeding tube .  She is anxious in general No regular exercise program No shortness of breath or chest pain on exertion Scheduled to see pulmonary in follow-up She might be open to radiation treatment of her lung mass on the right  She is concerned about the price of the anticoagulation, eliquis  EKG on today's visit shows atrial fibrillation with ventricular rate 84 bpm, no significant ST or T-wave changes  Other past medical history Several times has woken up at night, possibly 6 times over  the past several weeks with a "sensation" in her left chest. Does not last very long, less than a minute, then goes away. Denies any chest pain. She is unclear If it is palpitations  arthritis pill was held for creatinine more than 2.  Also reports having spinal stenosis, seen by Dr. Trenton Gammon, surgery was not indicated, she had cortisone shots 2, second shot affected a nerve.  previous episodes of chest pain resolved by decreasing the dose of her aspirin from 325 mg daily to 81 mg daily. She currently takes 81 mg 2 Prior pain in the subxiphoid region with occasional nausea and vomiting of green bilious liquid. She attributes her discomfort to taking meloxicam which was initiated for possible gout.   hospital admission 04/17/2014 to 04/19/2014 with COPD exacerbation, acute on chronic diastolic CHF She was given IV Lasix with worsening renal dysfunction also given IV steroids, nebulizers with good improvement of her shortness of breath Baseline creatinine before diuresis was 1.25. After diuresis was 1.96  Carotid ultrasound in the November 2012 has shown 40-59% bilateral carotid disease.  LE u/s 11/2011 suggest 50% distal aorta disease, right common iliac artery with equal or less than 50% disease, left common iliac with equal or greater than 60% disease. no severe renal artery stenosis  PMH:   has a past medical history of Arrhythmia; Chronic diastolic heart failure ([place]); CKD (chronic kidney disease), stage III; COPD (chronic obstructive pulmonary disease) ([place]); Coronary artery disease (2008); Diabetes mellitus; Diverticulosis; Hyperlipidemia; Hypertension; Melanoma ([place]); PVD (peripheral vascular disease) ([place]); and Spinal stenosis.  PSH:    Past Surgical History:  Procedure Laterality  Date  . CARDIAC CATHETERIZATION     PTCA x 3  . CATARACT EXTRACTION    . CORONARY ARTERY BYPASS GRAFT  2008   x 4  . HIP FRACTURE SURGERY Left    pins placed w/o hip replacement, hardware  removed later  . LUMBAR DISC SURGERY     in her 62s  . tonsillectomy    . VAGINAL DELIVERY     x2    Current Outpatient Prescriptions  Medication Sig Dispense Refill  . acetaminophen (TYLENOL) 500 MG tablet Take 500 mg by mouth daily.     Marland Kitchen apixaban (ELIQUIS) 2.5 MG TABS tablet Take 1 tablet (2.5 mg total) by mouth 2 (two) times daily. 180 tablet 3  . atorvastatin (LIPITOR) 10 MG tablet Take 1 tablet (10 mg total) by mouth daily at 6 PM. 30 tablet 6  . diltiazem (CARDIZEM CD) 240 MG 24 hr capsule Take 1 capsule (240 mg total) by mouth daily. 30 capsule 6  . furosemide (LASIX) 40 MG tablet Take 1 tablet (40 mg total) by mouth 2 (two) times daily. 60 tablet 6  . glipiZIDE (GLUCOTROL XL) 5 MG 24 hr tablet TAKE ONE TABLET BY MOUTH IN THE MORNING 30 tablet 5  . Insulin Detemir (LEVEMIR) 100 UNIT/ML Pen Inject 3-5 Units into the skin at bedtime as needed (for hyperglycemia). 15 mL prn  . nitroGLYCERIN (NITROSTAT) 0.4 MG SL tablet Place 1 tablet (0.4 mg total) under the tongue every 5 (five) minutes as needed for chest pain. 25 tablet 6  . senna-docusate (SENOKOT S) 8.6-50 MG tablet Take 1 tablet by mouth daily as needed for mild constipation or moderate constipation. 30 tablet 1  . traZODone (DESYREL) 50 MG tablet Take 0.5-1 tablets (25-50 mg total) by mouth at bedtime as needed for sleep.     No current facility-administered medications for this visit.      Allergies:   Nsaids   Social History:  The patient  reports that she quit smoking about 9 years ago. Her smoking use included Cigarettes. She has a 50.00 pack-year smoking history. She has never used smokeless tobacco. She reports that she does not drink alcohol or use drugs.   Family History:   family history includes Diabetes in her sister; Heart disease in her father and sister.    Review of Systems: Review of Systems  Constitutional: Negative.   Respiratory: Negative.   Cardiovascular: Negative.   Gastrointestinal: Negative.    Musculoskeletal: Negative.   Neurological: Negative.   Psychiatric/Behavioral: Negative.   All other systems reviewed and are negative.    PHYSICAL EXAM: VS:  BP 120/60 (BP Location: Left Arm, Patient Position: Sitting, Cuff Size: Normal)   Pulse 84   Ht 5' 3.5" (1.613 m)   Wt 121 lb 12 oz (55.2 kg)   BMI 21.23 kg/m  , BMI Body mass index is 21.23 kg/m. GEN: Well nourished, well developed, in no acute distress  HEENT: normal  Neck: no JVD, carotid bruits, or masses Cardiac: Irregularly irregular.  no murmurs, rubs, or gallops,no edema  Respiratory:  Mildly decreased breath sounds throughout, normal work of breathing GI: soft, nontender, nondistended, + BS MS: no deformity or atrophy  Skin: warm and dry, no rash Neuro:  Strength and sensation are intact Psych: euthymic mood, full affect    Recent Labs: 11/28/2016: ALT 13 01/06/2017: B Natriuretic Peptide 362.0; Magnesium 2.0; TSH 1.195 01/20/2017: BUN 15; Creatinine, Ser 1.35; Hemoglobin 12.0; Platelets 316.0; Potassium 4.1; Sodium 137  Lipid Panel Lab Results  Component Value Date   CHOL 94 01/07/2017   HDL 33 (L) 01/07/2017   LDLCALC 48 01/07/2017   TRIG 67 01/07/2017      Wt Readings from Last 3 Encounters:  01/23/17 121 lb 12 oz (55.2 kg)  01/20/17 123 lb 8 oz (56 kg)  01/17/17 121 lb (54.9 kg)       ASSESSMENT AND PLAN:  Chronic diastolic heart failure ([place]) - Plan: EKG 12-Lead Euvolemic,  takes Lasix twice a day She will need periodic basic metabolic panel  Hyperlipidemia, unspecified hyperlipidemia type - Plan: EKG 12-Lead Cholesterol is at goal on the current lipid regimen. No changes to the medications were made.  Essential hypertension - Plan: EKG 12-Lead Blood pressure is well controlled on today's visit. No changes made to the medications.  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 No further workup at this time. Continue current medication regimen.  Atrial fibrillation with rapid ventricular response ([place]) - Plan: EKG 12-Lead Currently rate controlled, on anticoagulation We spent some time discussing other types of anticoagulation that might be more affordable for her. Will request company assistance for eliquis  Lung mass Mass on the right consistent with lung cancer She would be open to radiation, she does not particular he want chemotherapy Feels that she needs to stay healthy to take care of her husband  Centrilobular emphysema ([place]) Followed by pulmonary. Denies having any significant shortness of breath   Total encounter time more than 25 minutes  Greater than 50% was spent in counseling and coordination of care with the patient   Disposition:   F/U  6 months   Orders Placed This Encounter  Procedures  . EKG 12-Lead     Signed, Esmond Plants, M.D., Ph.D. 01/23/2017  [place], [place]

## 2017-01-31 ENCOUNTER — Telehealth: Payer: Self-pay | Admitting: Cardiovascular Disease

## 2017-01-31 ENCOUNTER — Ambulatory Visit: Payer: Medicare Other | Admitting: Podiatry

## 2017-01-31 NOTE — Telephone Encounter (Signed)
Spoke with patient and she states that she has had some aches in the morning when she tries to get up out of bed. Let her know that the lipitor can cause muscle aches but that if she continues to have these and they become bothersome to her then we can see if we need to switch it or try something different. She was agreeable to continue it for now and continue monitoring her symptoms. Instructed her to give [place] a call back if they do not get better. She verbalized understanding of our conversation, agreement with plan, and had no further questions at this time.

## 2017-01-31 NOTE — Telephone Encounter (Signed)
Pt states each morning she "hurts and can't hardly get out of bed" States she thinks it is her Lipitor. Please call.

## 2017-02-05 ENCOUNTER — Telehealth: Payer: Self-pay

## 2017-02-05 ENCOUNTER — Telehealth: Payer: Self-pay | Admitting: Family Medicine

## 2017-02-05 NOTE — Telephone Encounter (Signed)
Patient notified and lab work cancelled.

## 2017-02-05 NOTE — Telephone Encounter (Signed)
Patient has an appointment for lab work tomorrow.  Patient's husband has an appointment with Dr.Duncan at 2:00.  I rescheduled patient's lab to 1:45.  Please put lab orders in computer and let me know if patient needs to fast for lab work.

## 2017-02-05 NOTE — Telephone Encounter (Signed)
Patient has requested a future call later this summer because of cardiac concerns.

## 2017-02-05 NOTE — Telephone Encounter (Signed)
Given the recent hospitalization and the labs that were done as an inpatient, she isn't due for any follow-up/routine labs at this point. This lab appointment can get canceled. Thanks.

## 2017-02-10 ENCOUNTER — Telehealth: Payer: Self-pay | Admitting: Cardiovascular Disease

## 2017-02-10 NOTE — Telephone Encounter (Signed)
Pt was advised at ov 01/23/17:   If you would like a cardioversion, please call to arrange at the end of February  If you are happy with atrial fib, blood thinner and fluid pill, You do not need a cardioversion   She would like to proceed w/ DCCV. What day would you like me to schedule her? You are Rounds Only this Wednesday.

## 2017-02-10 NOTE — Telephone Encounter (Signed)
Pt states she is "ready to have my heart jumped back in place". Please call to schedule.

## 2017-02-12 NOTE — Telephone Encounter (Signed)
Would consider scheduling next week on one of my hospital days

## 2017-02-13 ENCOUNTER — Ambulatory Visit: Payer: PRIVATE HEALTH INSURANCE | Admitting: Family Medicine

## 2017-02-13 ENCOUNTER — Telehealth: Payer: Self-pay | Admitting: Family Medicine

## 2017-02-13 NOTE — Telephone Encounter (Signed)
Patient called to find out when Dr.Duncan wants her to follow up with him. She didn't realize the appointment she had for today was cancelled. It looks like today's appointment was cancelled because it was for a 3 month follow up and she had a hospital follow up on 01/20/17 with Dr.Duncan.

## 2017-02-13 NOTE — Telephone Encounter (Signed)
Spoke w/ pt.  She is sched for DCCV 02/19/17 @ 7:45. She is sched to see Dr. [name] tomorrow - will review instructions and obtain pre-procedure labs during her visit.

## 2017-02-14 ENCOUNTER — Encounter: Payer: Self-pay | Admitting: Cardiovascular Disease

## 2017-02-14 ENCOUNTER — Ambulatory Visit (INDEPENDENT_AMBULATORY_CARE_PROVIDER_SITE_OTHER): Payer: Medicare Other | Admitting: Cardiovascular Disease

## 2017-02-14 ENCOUNTER — Other Ambulatory Visit: Payer: Self-pay

## 2017-02-14 VITALS — BP 110/60 | HR 105 | Ht 61.0 in | Wt 120.0 lb

## 2017-02-14 DIAGNOSIS — I4891 Unspecified atrial fibrillation: Secondary | ICD-10-CM | POA: Diagnosis not present

## 2017-02-14 DIAGNOSIS — I25718 Atherosclerosis of autologous vein coronary artery bypass graft(s) with other forms of angina pectoris: Secondary | ICD-10-CM

## 2017-02-14 DIAGNOSIS — I5032 Chronic diastolic (congestive) heart failure: Secondary | ICD-10-CM | POA: Diagnosis not present

## 2017-02-14 DIAGNOSIS — I10 Essential (primary) hypertension: Secondary | ICD-10-CM | POA: Diagnosis not present

## 2017-02-14 DIAGNOSIS — E785 Hyperlipidemia, unspecified: Secondary | ICD-10-CM

## 2017-02-14 DIAGNOSIS — J432 Centrilobular emphysema: Secondary | ICD-10-CM

## 2017-02-14 MED ORDER — AMIODARONE HCL 200 MG PO TABS: 200.0000 mg | ORAL_TABLET | Freq: Two times a day (BID) | ORAL | 3 refills | Status: AC

## 2017-02-14 NOTE — Progress Notes (Signed)
Cardiology Office Note  Date:  02/14/2017   ID:  Donna Lane, DOB 1925-02-12, MRN [id number]  PCP:  [name], MD   Chief Complaint  Patient presents with  . other    6 month follow up. Meds reviewed by the pt. verbally. Pt. c/o having no energy, shortness of breath with LE edema.     HPI:  Donna Lane is a pleasant 83 year old woman with history of coronary artery disease, catheterization August 25, 2007 showing severe three-vessel disease, with bypass surgery at [facility] September 2008 also with severe peripheral vascular disease, bilateral renal artery stenosis, carotid disease, iliac disease who presents for follow-up of her coronary artery disease.Recent hospitalization for shortness of breath and atrial fibrillation with RVR  Lung mass imaged on CT scan, 4.6 cm on the right   eliquis started 01/10/16 for persistent atrial fibrillation Rate control with diltiazem  In follow-up today she is interested in restoring normal sinus rhythm She reports that she has palpitations, shortness of breath, fatigue, no energy Wants to do something to get her energy back Still with significant stress at home taking care of her husband who has severe health issues, persistent dysphagia, she gives him food by feeding tube  She is anxious in general No regular exercise program No shortness of breath or chest pain on exertion Scheduled to see a specialist next week concerning lung mass on the right  EKG on today's visit shows atrial fibrillation with ventricular rate 8105 bpm, no significant ST or T-wave changes  Other past medical history Several times has woken up at night, possibly 6 times over the past several weeks with a "sensation" in her left chest. Does not last very long, less than a minute, then goes away. Denies any chest pain. She is unclear If it is palpitations  arthritis pill was held for creatinine more than 2.  Also reports having spinal  stenosis, seen by Dr. Trenton Gammon, surgery was not indicated, she had cortisone shots 2, second shot affected a nerve.  previous episodes of chest pain resolved by decreasing the dose of her aspirin from 325 mg daily to 81 mg daily. She currently takes 81 mg 2 Prior pain in the subxiphoid region with occasional nausea and vomiting of green bilious liquid. She attributes her discomfort to taking meloxicam which was initiated for possible gout.   hospital admission 04/17/2014 to 04/19/2014 with COPD exacerbation, acute on chronic diastolic CHF She was given IV Lasix with worsening renal dysfunction also given IV steroids, nebulizers with good improvement of her shortness of breath Baseline creatinine before diuresis was 1.25. After diuresis was 1.96  Carotid ultrasound in the November 2012 has shown 40-59% bilateral carotid disease.  LE u/s 11/2011 suggest 50% distal aorta disease, right common iliac artery with equal or less than 50% disease, left common iliac with equal or greater than 60% disease. no severe renal artery stenosis   PMH:   has a past medical history of Arrhythmia; Chronic diastolic heart failure ([place]); CKD (chronic kidney disease), stage III; COPD (chronic obstructive pulmonary disease) ([place]); Coronary artery disease (2008); Diabetes mellitus; Diverticulosis; Hyperlipidemia; Hypertension; Melanoma ([place]); PVD (peripheral vascular disease) ([place]); and Spinal stenosis.  PSH:    Past Surgical History:  Procedure Laterality Date  . CARDIAC CATHETERIZATION     PTCA x 3  . CATARACT EXTRACTION    . CORONARY ARTERY BYPASS GRAFT  2008   x 4  . HIP FRACTURE SURGERY Left    pins placed w/o hip  replacement, hardware removed later  . LUMBAR DISC SURGERY     in her 57s  . tonsillectomy    . VAGINAL DELIVERY     x2    Current Outpatient Prescriptions  Medication Sig Dispense Refill  . acetaminophen (TYLENOL) 500 MG tablet Take 500 mg by mouth every 8 (eight) hours as needed for  mild pain or moderate pain.     Marland Kitchen apixaban (ELIQUIS) 2.5 MG TABS tablet Take 1 tablet (2.5 mg total) by mouth 2 (two) times daily. 180 tablet 3  . atorvastatin (LIPITOR) 10 MG tablet Take 1 tablet (10 mg total) by mouth daily at 6 PM. 30 tablet 6  . diltiazem (CARDIZEM CD) 240 MG 24 hr capsule Take 1 capsule (240 mg total) by mouth daily. 30 capsule 6  . furosemide (LASIX) 40 MG tablet Take 1 tablet (40 mg total) by mouth 2 (two) times daily. 60 tablet 6  . glipiZIDE (GLUCOTROL XL) 5 MG 24 hr tablet TAKE ONE TABLET BY MOUTH IN THE MORNING 30 tablet 5  . Insulin Detemir (LEVEMIR) 100 UNIT/ML Pen Inject 3-5 Units into the skin at bedtime as needed (for hyperglycemia). 15 mL prn  . nitroGLYCERIN (NITROSTAT) 0.4 MG SL tablet Place 1 tablet (0.4 mg total) under the tongue every 5 (five) minutes as needed for chest pain. 25 tablet 6  . senna-docusate (SENOKOT S) 8.6-50 MG tablet Take 1 tablet by mouth daily as needed for mild constipation or moderate constipation. 30 tablet 1  . traZODone (DESYREL) 50 MG tablet Take 0.5-1 tablets (25-50 mg total) by mouth at bedtime as needed for sleep.    Marland Kitchen amiodarone (PACERONE) 200 MG tablet Take 1 tablet (200 mg total) by mouth 2 (two) times daily. 70 tablet 3   No current facility-administered medications for this visit.      Allergies:   Nsaids   Social History:  The patient  reports that she quit smoking about 9 years ago. Her smoking use included Cigarettes. She has a 50.00 pack-year smoking history. She has never used smokeless tobacco. She reports that she does not drink alcohol or use drugs.   Family History:   family history includes Diabetes in her sister; Heart disease in her father and sister.    Review of Systems: Review of Systems  Constitutional: Positive for malaise/fatigue.  Respiratory: Positive for shortness of breath and wheezing.   Cardiovascular: Positive for palpitations.  Gastrointestinal: Negative.   Musculoskeletal: Negative.    Neurological: Positive for weakness.  Psychiatric/Behavioral: Negative.   All other systems reviewed and are negative.    PHYSICAL EXAM: VS:  BP 110/60 (BP Location: Left Arm, Patient Position: Sitting, Cuff Size: Normal)   Pulse (!) 105   Ht '5\' 1"'$  (1.549 m)   Wt 120 lb (54.4 kg)   BMI 22.67 kg/m  , BMI Body mass index is 22.67 kg/m. GEN: Well nourished, well developed, in no acute distress  HEENT: normal  Neck: no JVD, carotid bruits, or masses Cardiac: Irregularly irregular, no murmurs, rubs, or gallops,no edema  Respiratory:  Moderately decreased breath sounds, scant wheezing,  normal work of breathing GI: soft, nontender, nondistended, + BS MS: no deformity or atrophy  Skin: warm and dry, no rash Neuro:  Strength and sensation are intact Psych: euthymic mood, full affect    Recent Labs: 11/28/2016: ALT 13 01/06/2017: B Natriuretic Peptide 362.0; Magnesium 2.0; TSH 1.195 01/20/2017: BUN 15; Creatinine, Ser 1.35; Hemoglobin 12.0; Platelets 316.0; Potassium 4.1; Sodium 137    Lipid  Panel Lab Results  Component Value Date   CHOL 94 01/07/2017   HDL 33 (L) 01/07/2017   LDLCALC 48 01/07/2017   TRIG 67 01/07/2017      Wt Readings from Last 3 Encounters:  02/14/17 120 lb (54.4 kg)  01/23/17 121 lb 12 oz (55.2 kg)  01/20/17 123 lb 8 oz (56 kg)       ASSESSMENT AND PLAN:  Chronic diastolic heart failure (HCC) - Appears relatively euvolemic on today's visit Blood pressure running borderline low We have recommended she take Lasix 40 mill grams once a day Only take afternoon Lasix for 3 pound weight gain  Hyperlipidemia, unspecified hyperlipidemia type -  Tolerating Lipitor 10 mg daily but having significant muscle ache We talked about changing to Crestor 5 mg daily. We will do this on her next clinic visit  Essential hypertension -  Blood pressure running low, we will decrease Lasix down to 40 mg daily   Atrial fibrillation with rapid ventricular response  (HCC) - long discussion concerning various treatment options for her atrial fibrillation Wheezing on exam, lung cancer, mildly dilated left atrium Likely all will make it more difficult to restore normal sinus rhythm and maintained normal sinus rhythm. Underlying coronary artery disease. Recommended she start amiodarone load first 400 mg twice a day for 5 days then down to 200 mg twice a day. We will start this in an attempt to restore normal sinus rhythm without general anesthesia and cardioversion  Centrilobular emphysema (HCC)  wheezing on exam today She has albuterol inhaler   Total encounter time more than 25 minutes  Greater than 50% was spent in counseling and coordination of care with the patient   Disposition:   F/U 2 weeks    Orders Placed This Encounter  Procedures  . EKG 12-Lead     Signed, Esmond Plants, M.D., Ph.D. 02/14/2017  [place], [place]

## 2017-02-14 NOTE — Telephone Encounter (Signed)
Notified of Dr. Josefine Class instructions. She wanted to wait until closer to April to schedule the f/u appt. She will call back to do so.

## 2017-02-14 NOTE — Telephone Encounter (Signed)
Reasonable to have diabetes check at the end of April or early May. If she has other concerns meantime then let me know. Thanks.

## 2017-02-14 NOTE — Telephone Encounter (Signed)
Rx request for Trazodone 50 mg LOV 01/20/2017 No new appt scheduled Last refilled 01/20/17 Please advise

## 2017-02-14 NOTE — Patient Instructions (Addendum)
Medication Instructions:   Please start amiodarone 2 pills twice a day for 5 days Then decrease the dose down to one pill twice a day  We will check EKG in 2 weeks  Labwork:  No new labs needed  Testing/Procedures: No testing at this time  I recommend watching educational videos on topics of interest to you at:       [email or web address].com  Enter code: [email or web address]   Follow-Up: It was a pleasure seeing you in the office today. Please call us if you have new issues that need to be addressed before your next appt.  [phone]  Your physician wants you to follow-up in: 2 weeks  If you need a refill on your cardiac medications before your next appointment, please call your pharmacy.

## 2017-02-16 NOTE — Telephone Encounter (Addendum)
Amiodarone recently started.  I need cards input on this with concurrent use of Trazodone.   Dr. [name]- are you okay with concurrent use? I didn't sent in Rx yet.     Would be due for OV in late April or early May re: DM2. OV sooner if needed.  Thanks.

## 2017-02-16 NOTE — Addendum Note (Signed)
Addended by: Tonia Ghent on: 02/16/2017 02:18 PM   Modules accepted: Orders

## 2017-02-17 ENCOUNTER — Encounter: Payer: Self-pay | Admitting: Internal Medicine

## 2017-02-17 ENCOUNTER — Encounter: Payer: Self-pay | Admitting: Family

## 2017-02-17 ENCOUNTER — Ambulatory Visit: Payer: Medicare Other | Attending: Family | Admitting: Family

## 2017-02-17 ENCOUNTER — Ambulatory Visit (INDEPENDENT_AMBULATORY_CARE_PROVIDER_SITE_OTHER): Payer: Medicare Other | Admitting: Internal Medicine

## 2017-02-17 VITALS — BP 112/60 | HR 112 | Wt 121.0 lb

## 2017-02-17 VITALS — BP 133/76 | HR 113 | Resp 18 | Ht 62.0 in | Wt 121.4 lb

## 2017-02-17 DIAGNOSIS — Z8249 Family history of ischemic heart disease and other diseases of the circulatory system: Secondary | ICD-10-CM | POA: Insufficient documentation

## 2017-02-17 DIAGNOSIS — R918 Other nonspecific abnormal finding of lung field: Secondary | ICD-10-CM | POA: Diagnosis not present

## 2017-02-17 DIAGNOSIS — I5032 Chronic diastolic (congestive) heart failure: Secondary | ICD-10-CM | POA: Diagnosis not present

## 2017-02-17 DIAGNOSIS — I2510 Atherosclerotic heart disease of native coronary artery without angina pectoris: Secondary | ICD-10-CM | POA: Insufficient documentation

## 2017-02-17 DIAGNOSIS — Z951 Presence of aortocoronary bypass graft: Secondary | ICD-10-CM | POA: Insufficient documentation

## 2017-02-17 DIAGNOSIS — E785 Hyperlipidemia, unspecified: Secondary | ICD-10-CM | POA: Diagnosis not present

## 2017-02-17 DIAGNOSIS — Z79899 Other long term (current) drug therapy: Secondary | ICD-10-CM | POA: Diagnosis not present

## 2017-02-17 DIAGNOSIS — R0603 Acute respiratory distress: Secondary | ICD-10-CM | POA: Diagnosis not present

## 2017-02-17 DIAGNOSIS — E1151 Type 2 diabetes mellitus with diabetic peripheral angiopathy without gangrene: Secondary | ICD-10-CM | POA: Diagnosis not present

## 2017-02-17 DIAGNOSIS — Z888 Allergy status to other drugs, medicaments and biological substances status: Secondary | ICD-10-CM | POA: Diagnosis not present

## 2017-02-17 DIAGNOSIS — Z9889 Other specified postprocedural states: Secondary | ICD-10-CM | POA: Insufficient documentation

## 2017-02-17 DIAGNOSIS — M4800 Spinal stenosis, site unspecified: Secondary | ICD-10-CM | POA: Insufficient documentation

## 2017-02-17 DIAGNOSIS — R079 Chest pain, unspecified: Secondary | ICD-10-CM | POA: Diagnosis not present

## 2017-02-17 DIAGNOSIS — I4891 Unspecified atrial fibrillation: Secondary | ICD-10-CM | POA: Diagnosis not present

## 2017-02-17 DIAGNOSIS — Z636 Dependent relative needing care at home: Secondary | ICD-10-CM

## 2017-02-17 DIAGNOSIS — Z794 Long term (current) use of insulin: Secondary | ICD-10-CM | POA: Insufficient documentation

## 2017-02-17 DIAGNOSIS — Z87891 Personal history of nicotine dependence: Secondary | ICD-10-CM | POA: Insufficient documentation

## 2017-02-17 DIAGNOSIS — I25718 Atherosclerosis of autologous vein coronary artery bypass graft(s) with other forms of angina pectoris: Secondary | ICD-10-CM | POA: Diagnosis not present

## 2017-02-17 DIAGNOSIS — Z833 Family history of diabetes mellitus: Secondary | ICD-10-CM | POA: Diagnosis not present

## 2017-02-17 DIAGNOSIS — N183 Chronic kidney disease, stage 3 (moderate): Secondary | ICD-10-CM | POA: Diagnosis not present

## 2017-02-17 DIAGNOSIS — E1122 Type 2 diabetes mellitus with diabetic chronic kidney disease: Secondary | ICD-10-CM | POA: Insufficient documentation

## 2017-02-17 DIAGNOSIS — J449 Chronic obstructive pulmonary disease, unspecified: Secondary | ICD-10-CM | POA: Insufficient documentation

## 2017-02-17 DIAGNOSIS — Z85820 Personal history of malignant melanoma of skin: Secondary | ICD-10-CM | POA: Diagnosis not present

## 2017-02-17 DIAGNOSIS — I10 Essential (primary) hypertension: Secondary | ICD-10-CM

## 2017-02-17 DIAGNOSIS — I130 Hypertensive heart and chronic kidney disease with heart failure and stage 1 through stage 4 chronic kidney disease, or unspecified chronic kidney disease: Secondary | ICD-10-CM | POA: Diagnosis not present

## 2017-02-17 MED ORDER — TRAZODONE HCL 50 MG PO TABS: 25.0000 mg | ORAL_TABLET | Freq: Every evening | ORAL | 1 refills | Status: DC | PRN

## 2017-02-17 NOTE — Progress Notes (Signed)
Patient ID: Donna Lane, female    DOB: 1925-05-23, 83 y.o.   MRN: [id number]  HPI  Donna Lane is a 83 y/o female with a history of spinal stenosis, PVD, HTN, hyperlipidemia, DM, CABG X 4, CKD, COPD, atrial fibrillation, lung mass (declines work-up), previous tobacco use and chronic heart failure.   Last echo was done 01/07/17 and showed an EF of 55-65% along with moderate MR/PR.  Admitted on 01/06/17 with acute respiratory distress due to HF along with new onset atrial fibrillation. Cardiology consult was obtained. Was begun on cardizem drip and then transitioned to oral medications. Pulmonology consult was also obtained due to possible right lung cancer. Patient opted to not have repeat CT scan done. Previous admission on 11/28/16 due to chest pain. CXR/chest CT showed right middle lobe mass. Discharged home the following day.   She presents today for her follow-up visit with fatigue and shortness of breath with moderate exertion. Minimal swelling in her lower legs. Weighing daily and reports a stable weight.  Recent glucose was 134. Under considerable stress due to taking care of her husband who has a feeding tube. Has been started on amiodarone but has only taken 3 doses due to pharmacy delay in getting the medication.  Past Medical History:  Diagnosis Date  . Arrhythmia    atrial fibrillation  . Chronic diastolic heart failure ([place])   . CKD (chronic kidney disease), stage III   . COPD (chronic obstructive pulmonary disease) ([place])   . Coronary artery disease 2008   CABG x 4,   . Diabetes mellitus   . Diverticulosis    on colonoscopy 08/12/2006  . Hyperlipidemia   . Hypertension   . Melanoma ([place])    removed by derm   . PVD (peripheral vascular disease) ([place])   . Spinal stenosis    Past Surgical History:  Procedure Laterality Date  . CARDIAC CATHETERIZATION     PTCA x 3  . CATARACT EXTRACTION    . CORONARY ARTERY BYPASS GRAFT  2008   x 4  . HIP FRACTURE SURGERY Left     pins placed w/o hip replacement, hardware removed later  . LUMBAR DISC SURGERY     in her 21s  . tonsillectomy    . VAGINAL DELIVERY     x2   Family History  Problem Relation Age of Onset  . Heart disease Father   . Heart disease Sister   . Diabetes Sister    Social History  Substance Use Topics  . Smoking status: Former Smoker    Packs/day: 1.00    Years: 50.00    Types: Cigarettes    Quit date: 03/20/2007  . Smokeless tobacco: Never Used  . Alcohol use No   Allergies  Allergen Reactions  . Nsaids Other (See Comments)    Would avoid due to CKD.... Pt states she not allergic   Prior to Admission medications   Medication Sig Start Date End Date Taking? Authorizing Provider  amiodarone (PACERONE) 200 MG tablet Take 1 tablet (200 mg total) by mouth 2 (two) times daily. 02/14/17  Yes Minna Merritts, MD  apixaban (ELIQUIS) 2.5 MG TABS tablet Take 1 tablet (2.5 mg total) by mouth 2 (two) times daily. 01/23/17  Yes Minna Merritts, MD  atorvastatin (LIPITOR) 10 MG tablet Take 1 tablet (10 mg total) by mouth daily at 6 PM. 01/23/17  Yes Minna Merritts, MD  cyanocobalamin 1000 MCG tablet Take 1,000 mcg by mouth daily.  Yes Historical Provider, MD  diltiazem (CARDIZEM CD) 240 MG 24 hr capsule Take 1 capsule (240 mg total) by mouth daily. 01/23/17  Yes Minna Merritts, MD  furosemide (LASIX) 40 MG tablet Take 1 tablet (40 mg total) by mouth 2 (two) times daily. Patient taking differently: Take 40 mg by mouth daily.  01/23/17  Yes Minna Merritts, MD  glipiZIDE (GLUCOTROL XL) 5 MG 24 hr tablet TAKE ONE TABLET BY MOUTH IN THE MORNING 11/05/16  Yes Tonia Ghent, MD  Insulin Detemir (LEVEMIR) 100 UNIT/ML Pen Inject 3-5 Units into the skin at bedtime as needed (for hyperglycemia). 02/29/16  Yes Tonia Ghent, MD  nitroGLYCERIN (NITROSTAT) 0.4 MG SL tablet Place 1 tablet (0.4 mg total) under the tongue every 5 (five) minutes as needed for chest pain. 01/23/17  Yes Minna Merritts, MD   traZODone (DESYREL) 50 MG tablet Take 0.5-1 tablets (25-50 mg total) by mouth at bedtime as needed for sleep. 02/17/17  Yes Tonia Ghent, MD     Review of Systems  Constitutional: Positive for fatigue. Negative for appetite change.  HENT: Positive for rhinorrhea. Negative for congestion and sore throat.   Eyes: Negative.   Respiratory: Positive for chest tightness and shortness of breath. Negative for cough.   Cardiovascular: Positive for palpitations and leg swelling. Negative for chest pain.  Gastrointestinal: Negative for abdominal distention and abdominal pain.  Endocrine: Negative.   Genitourinary: Negative.   Musculoskeletal: Negative for back pain and neck pain.  Skin: Negative.   Allergic/Immunologic: Negative.   Neurological: Positive for light-headedness. Negative for dizziness.  Hematological: Negative for adenopathy. Bruises/bleeds easily.  Psychiatric/Behavioral: Negative for dysphoric mood, sleep disturbance and suicidal ideas. The patient is nervous/anxious.    Vitals:   02/17/17 1027  BP: 133/76  Pulse: (!) 113  Resp: 18  SpO2: 97%  Weight: 121 lb 6 oz (55.1 kg)  Height: '5\' 2"'$  (1.575 m)   Wt Readings from Last 3 Encounters:  02/17/17 121 lb (54.9 kg)  02/17/17 121 lb 6 oz (55.1 kg)  02/14/17 120 lb (54.4 kg)   Lab Results  Component Value Date   CREATININE 1.35 (H) 01/20/2017   CREATININE 1.47 (H) 01/07/2017   CREATININE 1.42 (H) 01/06/2017    Physical Exam  Constitutional: She is oriented to person, place, and time. She appears well-developed and well-nourished.  HENT:  Head: Normocephalic and atraumatic.  Eyes: Conjunctivae are normal. Pupils are equal, round, and reactive to light.  Neck: Normal range of motion. Neck supple. No JVD present.  Cardiovascular: An irregular rhythm present. Tachycardia present.   Pulmonary/Chest: Effort normal. She has no wheezes. She has no rales.  Abdominal: Soft. She exhibits no distension. There is no tenderness.   Musculoskeletal: She exhibits edema (trace pitting edema around bilateral ankles). She exhibits no tenderness.  Neurological: She is alert and oriented to person, place, and time.  Skin: Skin is warm and dry.  Psychiatric: She has a normal mood and affect. Her behavior is normal. Thought content normal.  Nursing note and vitals reviewed.   Assessment & Plan:  1: Chronic heart failure with preserved ejection fraction- - NYHA class II - euvolemic today - weighing daily and weight is essentially unchanged from previous visit. Instructed to call for an overnight weight gain of >2 pounds or a weekly weight gain of >5 pounds - not adding salt and has been [facility] times a week to eat lunch and socialize. Is aware that  those meals probably have a lot of salt in them but she's not eating much at home anyways besides cereal or a sandwich. Husband has a feeding tube - received flu vaccine this season - saw her cardiologist [name]) on 02/14/17  2: HTN- - BP looks good today - saw PCP Damita Dunnings) 01/20/17 - not interested in home health for herself; daughter is assisting with medications  3: Atrial fibrillation- - has been started on amiodarone but has only had 3 doses of the medication - on apixaban and diltiazem at this time. Has concerns about the cost of apixaban and is going to speak to her cardiologist about alternatives. Discussed other medications including warfarin.  4: Caregiver stress-  - she is the sole caregiver for her husband who relies on a feeding tube. She admits that she does "everything" for him even though her daughter says that he can do some things himself. - encouraged her to continue going to the senior center when she can so that she can get a hot meal along with socialization.  Patient did not bring her medications nor a list. Each medication was verbally reviewed with the patient and she was encouraged to bring the bottles to every visit to confirm  accuracy of list.   Patient does not want to make a return appointment at this time. Advised her that she could call back at anytime to schedule another appointment.

## 2017-02-17 NOTE — Patient Instructions (Signed)
Check ONO Follow up in 3 months

## 2017-02-17 NOTE — Patient Instructions (Signed)
Continue weighing daily and call for an overnight weight gain of > 2 pounds or a weekly weight gain of >5 pounds. 

## 2017-02-17 NOTE — Progress Notes (Signed)
[facility] Consultation      Date: 02/17/2017,   MRN# [id number] Donna Lane 07/06/1925 Code Status:  Code Status History    Date Active Date Inactive Code Status Order ID Comments User Context   01/07/2017  7:26 PM 01/09/2017  5:42 PM Partial Code [id number]  Vaughan Basta, MD Inpatient   01/06/2017  3:21 PM 01/07/2017  7:26 PM Full Code [id number]  Epifanio Lesches, MD ED   11/28/2016  7:40 PM 11/29/2016  3:44 PM Full Code [id number]  Vaughan Basta, MD Inpatient    Questions for Most Recent Historical Code Status (Order [id number])    Question Answer Comment   In the event of cardiac or respiratory ARREST: Initiate Code Blue, Call Rapid Response Yes    In the event of cardiac or respiratory ARREST: Perform CPR Yes    In the event of cardiac or respiratory ARREST: Perform Intubation/Mechanical Ventilation No    In the event of cardiac or respiratory ARREST: Use NIPPV/BiPAp only if indicated Yes    In the event of cardiac or respiratory ARREST: Administer ACLS medications if indicated Yes    In the event of cardiac or respiratory ARREST: Perform Defibrillation or Cardioversion if indicated Yes      Hosp day:'@LENGTHOFSTAYDAYS'$ @ Referring MD: '@ATDPROV'$ @     PCP:      AdmissionWeight: 121 lb (54.9 kg)                 CurrentWeight: 121 lb (54.9 kg) Donna Lane is a 83 y.o. old female seen in consultation for lung mass at the request of Dr. Posey Pronto.     CHIEF COMPLAINT:  Follow up lung mass  ho  HISTORY OF PRESENT ILLNESS  83 y.o.femalewith a known history of Coronary artery disease, severe 3 vessel disease with bypass surgery, history of peripheral vascular disease, bilateral renal artery stenosis Also h/o COPD Was admitted last month for Afib with RVR, CT chest obtained shows possible endobronchial lesion, possible lung mass   She is diagnosed to have possible lung mass by CT chest in December but did not pursue any treatment or  workup for that because she did not want to hear the word cancer.    I have has long discussion with patient regarding her CT chest. She states: "If I have lung mass, I Don't want to get any procedures at this time"  She wants to forget that it even exists.  She is alert and awake, follows commands, resp status is stable, no acute issues at this time  Patient has chronic SOB and DOE, she does NOT want to pursue any more diagnostic interventions at this time patient has dx with COPD and does NOT want any inhalers at this time  No signs of infection at this time    PAST MEDICAL HISTORY   Past Medical History:  Diagnosis Date  . Arrhythmia    atrial fibrillation  . Chronic diastolic heart failure ([place])   . CKD (chronic kidney disease), stage III   . COPD (chronic obstructive pulmonary disease) ([place])   . Coronary artery disease 2008   CABG x 4,   . Diabetes mellitus   . Diverticulosis    on colonoscopy 08/12/2006  . Hyperlipidemia   . Hypertension   . Melanoma ([place])    removed by derm   . PVD (peripheral vascular disease) ([place])   . Spinal stenosis      SURGICAL HISTORY   Past Surgical History:  Procedure Laterality Date  .  CARDIAC CATHETERIZATION     PTCA x 3  . CATARACT EXTRACTION    . CORONARY ARTERY BYPASS GRAFT  2008   x 4  . HIP FRACTURE SURGERY Left    pins placed w/o hip replacement, hardware removed later  . LUMBAR DISC SURGERY     in her 41s  . tonsillectomy    . VAGINAL DELIVERY     x2     FAMILY HISTORY   Family History  Problem Relation Age of Onset  . Heart disease Father   . Heart disease Sister   . Diabetes Sister      SOCIAL HISTORY   Social History  Substance Use Topics  . Smoking status: Former Smoker    Packs/day: 1.00    Years: 50.00    Types: Cigarettes    Quit date: 03/20/2007  . Smokeless tobacco: Never Used  . Alcohol use No     MEDICATIONS    Home Medication:  Current Outpatient Rx  . Order #:  [id number] Class: Normal  . Order #: [id number] Class: Print  . Order #: [id number] Class: Normal  . Order #: [id number] Class: Historical Med  . Order #: [id number] Class: Normal  . Order #: [id number] Class: Normal  . Order #: [id number] Class: Normal  . Order #: [id number] Class: Normal  . Order #: [id number] Class: Normal  . Order #: [id number] Class: Print  . Order #: [id number] Class: Normal    Current Medication:  Current Outpatient Prescriptions:  .  amiodarone (PACERONE) 200 MG tablet, Take 1 tablet (200 mg total) by mouth 2 (two) times daily., Disp: 70 tablet, Rfl: 3 .  apixaban (ELIQUIS) 2.5 MG TABS tablet, Take 1 tablet (2.5 mg total) by mouth 2 (two) times daily., Disp: 180 tablet, Rfl: 3 .  atorvastatin (LIPITOR) 10 MG tablet, Take 1 tablet (10 mg total) by mouth daily at 6 PM., Disp: 30 tablet, Rfl: 6 .  cyanocobalamin 1000 MCG tablet, Take 1,000 mcg by mouth daily., Disp: , Rfl:  .  diltiazem (CARDIZEM CD) 240 MG 24 hr capsule, Take 1 capsule (240 mg total) by mouth daily., Disp: 30 capsule, Rfl: 6 .  furosemide (LASIX) 40 MG tablet, Take 1 tablet (40 mg total) by mouth 2 (two) times daily. (Patient taking differently: Take 40 mg by mouth daily. ), Disp: 60 tablet, Rfl: 6 .  glipiZIDE (GLUCOTROL XL) 5 MG 24 hr tablet, TAKE ONE TABLET BY MOUTH IN THE MORNING, Disp: 30 tablet, Rfl: 5 .  Insulin Detemir (LEVEMIR) 100 UNIT/ML Pen, Inject 3-5 Units into the skin at bedtime as needed (for hyperglycemia)., Disp: 15 mL, Rfl: prn .  nitroGLYCERIN (NITROSTAT) 0.4 MG SL tablet, Place 1 tablet (0.4 mg total) under the tongue every 5 (five) minutes as needed for chest pain., Disp: 25 tablet, Rfl: 6 .  senna-docusate (SENOKOT S) 8.6-50 MG tablet, Take 1 tablet by mouth daily as needed for mild constipation or moderate constipation., Disp: 30 tablet, Rfl: 1 .  traZODone (DESYREL) 50 MG tablet, Take 0.5-1 tablets (25-50 mg total) by mouth at bedtime as needed for sleep., Disp: 90 tablet, Rfl: 1    ALLERGIES     Nsaids     REVIEW OF SYSTEMS   Review of Systems  Constitutional: Positive for malaise/fatigue. Negative for chills, diaphoresis, fever and weight loss.  HENT: Negative for congestion and hearing loss.   Eyes: Negative for blurred vision and double vision.  Respiratory: Positive for shortness of breath. Negative for cough, hemoptysis, sputum production and wheezing.   Cardiovascular: Negative for chest pain,  palpitations and orthopnea.  Gastrointestinal: Negative for abdominal pain, heartburn, nausea and vomiting.  Genitourinary: Negative for dysuria and urgency.  Musculoskeletal: Negative for back pain, myalgias and neck pain.  Skin: Negative for rash.  Neurological: Negative for dizziness, tingling, tremors, weakness and headaches.  Endo/Heme/Allergies: Does not bruise/bleed easily.  Psychiatric/Behavioral: Negative for depression, substance abuse and suicidal ideas.  All other systems reviewed and are negative.    VS: BP 112/60 (BP Location: Right Arm, Cuff Size: Normal)   Pulse (!) 112   Wt 121 lb (54.9 kg)   SpO2 96%   BMI 22.13 kg/m      PHYSICAL EXAM  Physical Exam  Constitutional: She is oriented to person, place, and time. No distress.  HENT:  Head: Normocephalic and atraumatic.  Mouth/Throat: No oropharyngeal exudate.  Eyes: EOM are normal. Pupils are equal, round, and reactive to light. No scleral icterus.  Neck: Normal range of motion. Neck supple.  Cardiovascular: Normal rate, regular rhythm and normal heart sounds.   No murmur heard. Pulmonary/Chest: No stridor. No respiratory distress. She has no wheezes.  Abdominal: Soft. Bowel sounds are normal.  Musculoskeletal: Normal range of motion. She exhibits no edema.  Neurological: She is alert and oriented to person, place, and time. No cranial nerve deficit.  Skin: Skin is warm. She is not diaphoretic.  Psychiatric: She has a normal mood and affect.       ASSESSMENT/PLAN   83 yo white female seen  today for COPD and Lung mass  1.COPD-patient does NOT want any inhalers at this time 2.Lung mass-patient does NOT want to talk about lung mass and does NOT want any further work up at this time  Will check ONO  Follow up 3 months   Patient/Family are satisfied with Plan of action and management. All questions answered  Corrin Parker, M.D.  [facility]  Medical Director [place] Director [facility] Cardio-Pulmonary Department

## 2017-02-19 ENCOUNTER — Encounter: Admission: RE | Payer: Self-pay | Source: Ambulatory Visit

## 2017-02-19 ENCOUNTER — Ambulatory Visit: Admission: RE | Admit: 2017-02-19 | Payer: Medicare Other | Source: Ambulatory Visit | Admitting: Cardiovascular Disease

## 2017-02-19 SURGERY — CARDIOVERSION (CATH LAB)
Anesthesia: Moderate Sedation

## 2017-02-20 ENCOUNTER — Encounter: Payer: Self-pay | Admitting: Internal Medicine

## 2017-02-20 DIAGNOSIS — R918 Other nonspecific abnormal finding of lung field: Secondary | ICD-10-CM

## 2017-02-21 ENCOUNTER — Ambulatory Visit (INDEPENDENT_AMBULATORY_CARE_PROVIDER_SITE_OTHER): Payer: Medicare Other | Admitting: Family Medicine

## 2017-02-21 ENCOUNTER — Encounter: Payer: Self-pay | Admitting: Family Medicine

## 2017-02-21 DIAGNOSIS — Z794 Long term (current) use of insulin: Secondary | ICD-10-CM

## 2017-02-21 DIAGNOSIS — R609 Edema, unspecified: Secondary | ICD-10-CM

## 2017-02-21 DIAGNOSIS — I25718 Atherosclerosis of autologous vein coronary artery bypass graft(s) with other forms of angina pectoris: Secondary | ICD-10-CM | POA: Diagnosis not present

## 2017-02-21 DIAGNOSIS — IMO0002 Reserved for concepts with insufficient information to code with codable children: Secondary | ICD-10-CM

## 2017-02-21 DIAGNOSIS — E118 Type 2 diabetes mellitus with unspecified complications: Secondary | ICD-10-CM | POA: Diagnosis not present

## 2017-02-21 DIAGNOSIS — E1165 Type 2 diabetes mellitus with hyperglycemia: Secondary | ICD-10-CM | POA: Diagnosis not present

## 2017-02-21 MED ORDER — FUROSEMIDE 40 MG PO TABS: 40.0000 mg | ORAL_TABLET | Freq: Every day | ORAL | Status: DC

## 2017-02-21 MED ORDER — INSULIN DETEMIR 100 UNIT/ML FLEXPEN: PEN_INJECTOR | SUBCUTANEOUS | Status: DC

## 2017-02-21 NOTE — Progress Notes (Signed)
Sugar has been gradually increasing.  Up to ~170-180 in the AM.  She has been splitting her dose of insulin. Discussed with patient about insulin dose titration and only giving long-acting insulin once a day.  She continues to have bilateral leg swelling. She also has a mass previously documented on chest imaging. She tells me "it's not there and I'm not worried about it".  She doesn't want to talk about it and she denies the existence of the lesion.  She has no plan this point, as far as I can tell, to deal with the lesion otherwise.  Meds, vitals, and allergies reviewed.   ROS: Per HPI unless specifically indicated in ROS section   nad ncat Neck supple, no LA IRR, no tachy ctab abd soft Ext with 2+ BLE edema

## 2017-02-21 NOTE — Progress Notes (Signed)
Pre visit review using our clinic review tool, if applicable. No additional management support is needed unless otherwise documented below in the visit note. 

## 2017-02-21 NOTE — Patient Instructions (Signed)
Give yourself 4 units of insulin tonight.  If your AM sugar the next day is above 150, then given 5 units at night (adding 1 unit per day if needed) Keep doing once a day and gradually add 1 unit per day until AM sugar is between 100 and 150.   If your sugar is between 100 and 150, then don't change your dose that day.  If lower then 100, then cut back 1 unit of insulin.

## 2017-02-23 DIAGNOSIS — R609 Edema, unspecified: Secondary | ICD-10-CM | POA: Insufficient documentation

## 2017-02-23 NOTE — Assessment & Plan Note (Signed)
Continue diuretic. Discussed with patient.

## 2017-02-23 NOTE — Assessment & Plan Note (Signed)
Change to once daily dosing of insulin at night. Give 4 units of insulin tonight.  If AM sugar the next day is above 150, then given 5 units at night (adding 1 unit per day if needed).  Keep dosing once a day and gradually add 1 unit per day until AM sugar is between 100 and 150.   If sugar is between 100 and 150, then don't change dose that day.  If lower then 100, then cut back 1 unit of insulin.   Update me as needed. She agrees.

## 2017-02-25 ENCOUNTER — Telehealth: Payer: Self-pay | Admitting: Cardiovascular Disease

## 2017-02-25 NOTE — Telephone Encounter (Signed)
Pt states she has had a 3 pound weight gain. States she gained this overnight. Please call. Pt states she is SOB, he hands and feet are swollen. States she also is not sleeping well, due to her not being able to breathe

## 2017-02-25 NOTE — Telephone Encounter (Signed)
Spoke w/ pt.  She reports that she has had coffee and water, she has been drinking more b/c she thought she had to if she took a fluid pill. She has taken 1- 40 mg lasix and is sched to take a 1/2 pill at 2:00. Pt has compression hose, but she cannot get them on.  Her aide tried to put them on her, but cut her let. Pt's husband may have some ACE wraps, she will have him wrap her lower legs for her.  Pt keeps her feet elevated in the recliner when at home; she is not planning on leaving home today.  Advised pt to take an extra lasix 40 mg today and to limit her fluids.  Asked her to call back tomorrow if she is not feeling better. She is appreciative and will call back tomorrow to update me on how she is doing.

## 2017-02-26 ENCOUNTER — Telehealth: Payer: Self-pay | Admitting: Family Medicine

## 2017-02-26 ENCOUNTER — Telehealth: Payer: Self-pay | Admitting: *Deleted

## 2017-02-26 DIAGNOSIS — J449 Chronic obstructive pulmonary disease, unspecified: Secondary | ICD-10-CM

## 2017-02-26 NOTE — Telephone Encounter (Signed)
Pt informed of ONO results. Order placed for nighttime O2 @ 2L. Nothing further needed.

## 2017-02-26 NOTE — Telephone Encounter (Signed)
Pt declined to schedule AWV

## 2017-02-27 ENCOUNTER — Telehealth: Payer: Self-pay | Admitting: Cardiovascular Disease

## 2017-02-27 NOTE — Telephone Encounter (Signed)
Spoke with patient and she states that she has such a dry mouth and just not feeling well. She is scheduled to come in Monday to see Dr. [name] and she confirmed this appointment. Offered her appointment with NP tomorrow and she declined stating that she wanted to wait till Monday. Instructed her to go to ED if symptoms worsen and that if we had a cancellation we would give her a call. She was appreciative for the call and had no further questions at this time.

## 2017-02-27 NOTE — Telephone Encounter (Signed)
Suggested she hold the Lasix for 2 days Then cut the pill in half daily It is possible she is getting mildly dehydrated Perhaps drink a little bit more fluids

## 2017-02-27 NOTE — Telephone Encounter (Signed)
Patient calling c/o Dry mouth not urinating as usual heart racing  Burning all over    Please call patient as she is not sure she can make it until Monday appointment

## 2017-02-28 ENCOUNTER — Other Ambulatory Visit: Payer: Self-pay | Admitting: *Deleted

## 2017-02-28 MED ORDER — GLIPIZIDE ER 5 MG PO TB24: 5.0000 mg | ORAL_TABLET | Freq: Every morning | ORAL | 2 refills | Status: DC

## 2017-02-28 NOTE — Telephone Encounter (Signed)
Received faxed refill request from pharmacy for Glipizide requesting a 90 day supply. Refill sent to pharmacy electronically.

## 2017-02-28 NOTE — Telephone Encounter (Signed)
Spoke w/ pt.   She reports that Dr. Mortimer Fries put her on O2 and she slept better last night.  She is feeling a bit better today, but is still feeling like she is "burning all over" and does not have any strength. Advised her of Dr. Donivan Scull recommendation.  She is agreeable and will keep appt on Monday.

## 2017-03-02 NOTE — Progress Notes (Signed)
Cardiology Office Note  Date:  03/03/2017   ID:  Donna Lane, DOB 1925-02-25, MRN [id number]  PCP:  [name], MD   Chief Complaint  Patient presents with  . Other    2 week follow up. Pt. c/o no energy, has a burning sensation all over her body.     HPI:  Donna Lane is a pleasant 83 year old woman with history of coronary artery disease, catheterization August 25, 2007 showing severe three-vessel disease, with bypass surgery at [facility] September 2008 also with severe peripheral vascular disease, bilateral renal artery stenosis, carotid disease, iliac disease who presents for follow-up of her coronary artery disease.Recent hospitalization for shortness of breath and atrial fibrillation with RVR Lung mass imaged on CT scan, 4.6 cm on the right  Last seen on 02/14/2017 Started on amiodarone for pharmacological cardioversion eliquis started 01/10/16 for persistent atrial fibrillation Rate control with diltiazem  In follow-up today she remains in atrial fibrillation, She reports that she feels weak, using wheelchair to get around Daughter presents with her, reports that she is not eating much Daughter started her on a protein shake   interested in restoring normal sinus rhythm Concerned this may be contributing to palpitations, shortness of breath, fatigue, no energy  Still with significant stress at home taking care of her husband who has severe health issues, persistent dysphagia, she gives him food by feeding tube  EKG reviewed person by myself on today's visit shows atrial fibrillation with ventricular rate 91 bpm, no significant ST or T-wave changes  Other past medical history Several times has woken up at night, possibly 6 times over the past several weeks with a "sensation" in her left chest. Does not last very long, less than a minute, then goes away. Denies any chest pain. She is unclear If it is palpitations  arthritis pill was held for  creatinine more than 2.  Also reports having spinal stenosis, seen by Dr. Trenton Gammon, surgery was not indicated, she had cortisone shots 2, second shot affected a nerve.  previous episodes of chest pain resolved by decreasing the dose of her aspirin from 325 mg daily to 81 mg daily. She currently takes 81 mg 2 Prior pain in the subxiphoid region with occasional nausea and vomiting of green bilious liquid. She attributes her discomfort to taking meloxicam which was initiated for possible gout.   hospital admission 04/17/2014 to 04/19/2014 with COPD exacerbation, acute on chronic diastolic CHF She was given IV Lasix with worsening renal dysfunction also given IV steroids, nebulizers with good improvement of her shortness of breath Baseline creatinine before diuresis was 1.25. After diuresis was 1.96  Carotid ultrasound in the November 2012 has shown 40-59% bilateral carotid disease.  LE u/s 11/2011 suggest 50% distal aorta disease, right common iliac artery with equal or less than 50% disease, left common iliac with equal or greater than 60% disease. no severe renal artery stenosis  PMH:   has a past medical history of Arrhythmia; Chronic diastolic heart failure ([place]); CKD (chronic kidney disease), stage III; COPD (chronic obstructive pulmonary disease) ([place]); Coronary artery disease (2008); Diabetes mellitus; Diverticulosis; Hyperlipidemia; Hypertension; Melanoma ([place]); PVD (peripheral vascular disease) ([place]); and Spinal stenosis.  PSH:    Past Surgical History:  Procedure Laterality Date  . CARDIAC CATHETERIZATION     PTCA x 3  . CATARACT EXTRACTION    . CORONARY ARTERY BYPASS GRAFT  2008   x 4  . HIP FRACTURE SURGERY Left    pins placed w/o  hip replacement, hardware removed later  . LUMBAR DISC SURGERY     in her 34s  . tonsillectomy    . VAGINAL DELIVERY     x2    Current Outpatient Prescriptions  Medication Sig Dispense Refill  . amiodarone (PACERONE) 200 MG tablet Take 1  tablet (200 mg total) by mouth 2 (two) times daily. 70 tablet 3  . apixaban (ELIQUIS) 2.5 MG TABS tablet Take 1 tablet (2.5 mg total) by mouth 2 (two) times daily. 180 tablet 3  . atorvastatin (LIPITOR) 10 MG tablet Take 1 tablet (10 mg total) by mouth daily at 6 PM. 30 tablet 6  . cyanocobalamin 1000 MCG tablet Take 1,000 mcg by mouth daily.    Marland Kitchen diltiazem (CARDIZEM CD) 240 MG 24 hr capsule Take 1 capsule (240 mg total) by mouth daily. 30 capsule 6  . furosemide (LASIX) 40 MG tablet Take 1 tablet (40 mg total) by mouth daily. Take daily; if weight gain of more than 3 pounds, take twice daily    . glipiZIDE (GLUCOTROL XL) 5 MG 24 hr tablet Take 1 tablet (5 mg total) by mouth every morning. 90 tablet 2  . Insulin Detemir (LEVEMIR) 100 UNIT/ML Pen Inject as directed once a day    . nitroGLYCERIN (NITROSTAT) 0.4 MG SL tablet Place 1 tablet (0.4 mg total) under the tongue every 5 (five) minutes as needed for chest pain. 25 tablet 6  . traZODone (DESYREL) 50 MG tablet Take 0.5-1 tablets (25-50 mg total) by mouth at bedtime as needed for sleep. 90 tablet 1   No current facility-administered medications for this visit.      Allergies:   Nsaids   Social History:  The patient  reports that she quit smoking about 9 years ago. Her smoking use included Cigarettes. She has a 50.00 pack-year smoking history. She has never used smokeless tobacco. She reports that she does not drink alcohol or use drugs.   Family History:   family history includes Diabetes in her sister; Heart disease in her father and sister.    Review of Systems: Review of Systems  Constitutional: Positive for malaise/fatigue.  Respiratory: Negative.   Cardiovascular: Positive for palpitations.  Gastrointestinal: Negative.   Musculoskeletal: Negative.        Leg weakness  Neurological: Positive for weakness.  Psychiatric/Behavioral: Negative.   All other systems reviewed and are negative.    PHYSICAL EXAM: VS:  BP 110/60 (BP  Location: Left Arm, Patient Position: Sitting, Cuff Size: Normal)   Pulse 91   Ht '5\' 1"'$  (1.549 m)   Wt 123 lb (55.8 kg)   BMI 23.24 kg/m  , BMI Body mass index is 23.24 kg/m. GEN: Thin,  in no acute distress, she presents today in a wheelchair HEENT: normal  Neck: no JVD, carotid bruits, or masses Cardiac: Irregularly irregular, no murmurs, rubs, or gallops,no edema  Respiratory:  clear to auscultation bilaterally, normal work of breathing GI: soft, nontender, nondistended, + BS MS: no deformity or atrophy  Skin: warm and dry, no rash Neuro:  Strength and sensation are intact Psych: euthymic mood, full affect    Recent Labs: 11/28/2016: ALT 13 01/06/2017: B Natriuretic Peptide 362.0; Magnesium 2.0; TSH 1.195 01/20/2017: BUN 15; Creatinine, Ser 1.35; Hemoglobin 12.0; Platelets 316.0; Potassium 4.1; Sodium 137    Lipid Panel Lab Results  Component Value Date   CHOL 94 01/07/2017   HDL 33 (L) 01/07/2017   LDLCALC 48 01/07/2017   TRIG 67 01/07/2017  Wt Readings from Last 3 Encounters:  03/03/17 123 lb (55.8 kg)  02/21/17 123 lb 4 oz (55.9 kg)  02/17/17 121 lb (54.9 kg)       ASSESSMENT AND PLAN:  Chronic diastolic heart failure ([place]) - Plan: EKG 12-Lead Trace ankle swelling, no changes to her medications  Mixed hyperlipidemia - Plan: EKG 12-Lead Currently on Lipitor  Essential hypertension - Plan: EKG 12-Lead Blood pressure is well controlled on today's visit. No changes made to the medications.  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 No further workup at this time. Continue current medication regimen.  Peripheral arterial disease ([place]) - Plan: EKG 12-Lead  Atrial fibrillation with rapid ventricular response ([place]) - Plan: EKG 12-Lead We will schedule her for cardioversion March 23 She does not need repeat lab work in preparation Recommended  that she stay on her anticoagulation, take her amiodarone, eliquis  and diltiazem the morning of the procedure No food morning of the procedure  Uncontrolled type 2 diabetes mellitus with complication, with long-term current use of insulin ([place]) - Plan: EKG 12-Lead Recommended she liberalize her diet as she is not eating well  Chronic kidney disease, stage 3 - Plan: EKG 12-Lead  Lung mass - Plan: EKG 12-Lead  Centrilobular emphysema ([place]) - Plan: EKG 12-Lead Long prior smoking history  Edema, unspecified type - Plan: EKG 12-Lead Edema has improved, minimal swelling around her ankles Unable to exclude poor nutrition as a contributor to her symptoms We'll continue Lasix daily   Total encounter time more than 25 minutes  Greater than 50% was spent in counseling and coordination of care with the patient   Disposition:   F/U  6 months   Orders Placed This Encounter  Procedures  . EKG 12-Lead     Signed, Esmond Plants, M.D., Ph.D. 03/03/2017  [place], [place]

## 2017-03-03 ENCOUNTER — Ambulatory Visit (INDEPENDENT_AMBULATORY_CARE_PROVIDER_SITE_OTHER): Payer: Medicare Other | Admitting: Cardiovascular Disease

## 2017-03-03 ENCOUNTER — Encounter: Payer: Self-pay | Admitting: Cardiovascular Disease

## 2017-03-03 VITALS — BP 110/60 | HR 91 | Ht 61.0 in | Wt 123.0 lb

## 2017-03-03 DIAGNOSIS — E118 Type 2 diabetes mellitus with unspecified complications: Secondary | ICD-10-CM | POA: Diagnosis not present

## 2017-03-03 DIAGNOSIS — I25718 Atherosclerosis of autologous vein coronary artery bypass graft(s) with other forms of angina pectoris: Secondary | ICD-10-CM | POA: Diagnosis not present

## 2017-03-03 DIAGNOSIS — E1165 Type 2 diabetes mellitus with hyperglycemia: Secondary | ICD-10-CM

## 2017-03-03 DIAGNOSIS — I5032 Chronic diastolic (congestive) heart failure: Secondary | ICD-10-CM

## 2017-03-03 DIAGNOSIS — N183 Chronic kidney disease, stage 3 unspecified: Secondary | ICD-10-CM

## 2017-03-03 DIAGNOSIS — E782 Mixed hyperlipidemia: Secondary | ICD-10-CM | POA: Diagnosis not present

## 2017-03-03 DIAGNOSIS — I4891 Unspecified atrial fibrillation: Secondary | ICD-10-CM | POA: Diagnosis not present

## 2017-03-03 DIAGNOSIS — I739 Peripheral vascular disease, unspecified: Secondary | ICD-10-CM

## 2017-03-03 DIAGNOSIS — I209 Angina pectoris, unspecified: Secondary | ICD-10-CM

## 2017-03-03 DIAGNOSIS — I10 Essential (primary) hypertension: Secondary | ICD-10-CM | POA: Diagnosis not present

## 2017-03-03 DIAGNOSIS — J432 Centrilobular emphysema: Secondary | ICD-10-CM

## 2017-03-03 DIAGNOSIS — IMO0002 Reserved for concepts with insufficient information to code with codable children: Secondary | ICD-10-CM

## 2017-03-03 DIAGNOSIS — Z794 Long term (current) use of insulin: Secondary | ICD-10-CM

## 2017-03-03 DIAGNOSIS — R918 Other nonspecific abnormal finding of lung field: Secondary | ICD-10-CM

## 2017-03-03 DIAGNOSIS — R609 Edema, unspecified: Secondary | ICD-10-CM

## 2017-03-03 NOTE — Patient Instructions (Addendum)
Medication Instructions:   No medication changes made  Labwork:  No new labs needed  Testing/Procedures:  We will schedule a cardioversion on Friday Take amiodarone, diltiazem and eliquis the morning of the procedure  Not breakfast on Friday AM  Your physician has recommended that you have a Cardioversion (DCCV). Electrical Cardioversion uses a jolt of electricity to your heart either through paddles or wired patches attached to your chest. This is a controlled, usually prescheduled, procedure. Defibrillation is done under light anesthesia in the hospital, and you usually go home the day of the procedure. This is done to get your heart back into a normal rhythm. You are not awake for the procedure. Please see the instruction sheet given to you today.  You are scheduled for a Cardioversion on Friday, March 23 with Dr. [name]. Please arrive at the [facility] at 6:30 a.m. on the day of your procedure.  DIET INSTRUCTIONS:  Nothing to eat or drink after midnight except your medications with a sip of water.      1) Labs: Dr. [name] has ok'd that you will not need new labs prior to this cardioversion  2) Medications:  YOU MAY TAKE ALL of your remaining medications with a small amount of water.  3) Must have a responsible person to drive you home.  4) Bring a current list of your medications and current insurance cards.    If you have any questions after you get home, please call the office at [phone]  Follow-Up: It was a pleasure seeing you in the office today. Please call us if you have new issues that need to be addressed before your next appt.  [phone]  Your physician wants you to follow-up in: 2 to 3 weeks.    If you need a refill on your cardiac medications before your next appointment, please call your pharmacy.     Electrical Cardioversion Electrical cardioversion is the delivery of a jolt of electricity to restore a normal rhythm to the heart. A  rhythm that is too fast or is not regular keeps the heart from pumping well. In this procedure, sticky patches or metal paddles are placed on the chest to deliver electricity to the heart from a device. This procedure may be done in an emergency if:  There is low or no blood pressure as a result of the heart rhythm.  Normal rhythm must be restored as fast as possible to protect the brain and heart from further damage.  It may save a life. This procedure may also be done for irregular or fast heart rhythms that are not immediately life-threatening. Tell a health care provider about:  Any allergies you have.  All medicines you are taking, including vitamins, herbs, eye drops, creams, and over-the-counter medicines.  Any problems you or family members have had with anesthetic medicines.  Any blood disorders you have.  Any surgeries you have had.  Any medical conditions you have.  Whether you are pregnant or may be pregnant. What are the risks? Generally, this is a safe procedure. However, problems may occur, including:  Allergic reactions to medicines.  A blood clot that breaks free and travels to other parts of your body.  The possible return of an abnormal heart rhythm within hours or days after the procedure.  Your heart stopping (cardiac arrest). This is rare. What happens before the procedure? Medicines   Your health care provider may have you start taking:  Blood-thinning medicines (anticoagulants) so your blood  does not clot as easily.  Medicines may be given to help stabilize your heart rate and rhythm.  Ask your health care provider about changing or stopping your regular medicines. This is especially important if you are taking diabetes medicines or blood thinners. General instructions   Plan to have someone take you home from the hospital or clinic.  If you will be going home right after the procedure, plan to have someone with you for 24 hours.  Follow  instructions from your health care provider about eating or drinking restrictions. What happens during the procedure?  To lower your risk of infection:  Your health care team will wash or sanitize their hands.  Your skin will be washed with soap.  An IV tube will be inserted into one of your veins.  You will be given a medicine to help you relax (sedative).  Sticky patches (electrodes) or metal paddles may be placed on your chest.  An electrical shock will be delivered. The procedure may vary among health care providers and hospitals. What happens after the procedure?  Your blood pressure, heart rate, breathing rate, and blood oxygen level will be monitored until the medicines you were given have worn off.  Do not drive for 24 hours if you were given a sedative.  Your heart rhythm will be watched to make sure it does not change. This information is not intended to replace advice given to you by your health care provider. Make sure you discuss any questions you have with your health care provider. Document Released: 11/22/2002 Document Revised: 07/31/2016 Document Reviewed: 06/07/2016 Elsevier Interactive Patient Education  2017 [employer].

## 2017-03-06 ENCOUNTER — Telehealth: Payer: Self-pay | Admitting: Cardiovascular Disease

## 2017-03-06 ENCOUNTER — Other Ambulatory Visit: Payer: Self-pay | Admitting: Cardiovascular Disease

## 2017-03-06 ENCOUNTER — Other Ambulatory Visit
Admission: RE | Admit: 2017-03-06 | Discharge: 2017-03-06 | Disposition: A | Discharge status: Home / Self Care | Payer: Medicare Other | Source: Ambulatory Visit | Attending: Cardiovascular Disease | Admitting: Cardiovascular Disease

## 2017-03-06 DIAGNOSIS — R609 Edema, unspecified: Secondary | ICD-10-CM

## 2017-03-06 LAB — BASIC METABOLIC PANEL
Anion gap: 10 (ref 5–15)
BUN: 36 mg/dL — ABNORMAL HIGH (ref 6–20)
CHLORIDE: 93 mmol/L — AB (ref 101–111)
CO2: 27 mmol/L (ref 22–32)
Calcium: 8.8 mg/dL — ABNORMAL LOW (ref 8.9–10.3)
Creatinine, Ser: 1.33 mg/dL — ABNORMAL HIGH (ref 0.44–1.00)
GFR, EST AFRICAN AMERICAN: 40 mL/min — AB (ref >60)
GFR, EST NON AFRICAN AMERICAN: 34 mL/min — AB (ref >60)
Glucose, Bld: 271 mg/dL — ABNORMAL HIGH (ref 65–99)
POTASSIUM: 3.6 mmol/L (ref 3.5–5.1)
SODIUM: 130 mmol/L — AB (ref 135–145)

## 2017-03-06 NOTE — Telephone Encounter (Signed)
Spoke w/ Donna Lane.  She reports that her mouth is dry and she thinks she is dehydrated, but she is swollen all over, even in her face. Spoke w/ Dr. [name] and he recommends that Donna Lane come over for STAT BMET to see if she is dehydrated or overloaded. She will try to have someone bring her over for labs at [facility] and will keep her appt for DCCV tomorrow. She is appreciative of the call.

## 2017-03-06 NOTE — Telephone Encounter (Signed)
Would be best for the patient to route to him as he just saw her 3 days ago.

## 2017-03-06 NOTE — Telephone Encounter (Signed)
Ryan,  Dr. [name] is in clinic this morning and I won't get to talk to him about this for a while. Pt is sched for DCCV tomorrow am.  Would you make any recommendations for her or just have her wait on procedure tomorrow?

## 2017-03-06 NOTE — Telephone Encounter (Signed)
Pt calling stating she is swollen "all over"  Been swollen all week But today she's feel like she's not able to hold any more  Has been taking her fluid pills Has a procedure in the morning would like to know what to do before than as well.  Would like some advise on this

## 2017-03-07 ENCOUNTER — Ambulatory Visit: Payer: Medicare Other | Admitting: Anesthesiology

## 2017-03-07 ENCOUNTER — Ambulatory Visit (HOSPITAL_BASED_OUTPATIENT_CLINIC_OR_DEPARTMENT_OTHER)
Admission: RE | Admit: 2017-03-07 | Discharge: 2017-03-07 | Disposition: A | Discharge status: Home / Self Care | Payer: Medicare Other | Source: Ambulatory Visit | Attending: Cardiovascular Disease | Admitting: Cardiovascular Disease

## 2017-03-07 ENCOUNTER — Encounter
Admission: RE | Disposition: A | Discharge status: Home / Self Care | Payer: Self-pay | Source: Ambulatory Visit | Attending: Cardiovascular Disease

## 2017-03-07 ENCOUNTER — Other Ambulatory Visit: Payer: Self-pay

## 2017-03-07 ENCOUNTER — Encounter: Payer: Self-pay | Admitting: Emergency Medicine

## 2017-03-07 ENCOUNTER — Encounter: Payer: Self-pay | Admitting: *Deleted

## 2017-03-07 ENCOUNTER — Emergency Department: Payer: Medicare Other

## 2017-03-07 ENCOUNTER — Inpatient Hospital Stay
Admission: EM | Admit: 2017-03-07 | Discharge: 2017-03-13 | DRG: 871 | Disposition: A | Discharge status: Skilled Nursing Facility | Payer: Medicare Other | Attending: Internal Medicine | Admitting: Internal Medicine

## 2017-03-07 DIAGNOSIS — E1122 Type 2 diabetes mellitus with diabetic chronic kidney disease: Secondary | ICD-10-CM | POA: Insufficient documentation

## 2017-03-07 DIAGNOSIS — Y95 Nosocomial condition: Secondary | ICD-10-CM | POA: Diagnosis present

## 2017-03-07 DIAGNOSIS — J9811 Atelectasis: Secondary | ICD-10-CM | POA: Insufficient documentation

## 2017-03-07 DIAGNOSIS — M4800 Spinal stenosis, site unspecified: Secondary | ICD-10-CM

## 2017-03-07 DIAGNOSIS — A419 Sepsis, unspecified organism: Secondary | ICD-10-CM | POA: Diagnosis not present

## 2017-03-07 DIAGNOSIS — R609 Edema, unspecified: Secondary | ICD-10-CM

## 2017-03-07 DIAGNOSIS — Z79899 Other long term (current) drug therapy: Secondary | ICD-10-CM

## 2017-03-07 DIAGNOSIS — Z951 Presence of aortocoronary bypass graft: Secondary | ICD-10-CM

## 2017-03-07 DIAGNOSIS — N183 Chronic kidney disease, stage 3 (moderate): Secondary | ICD-10-CM | POA: Insufficient documentation

## 2017-03-07 DIAGNOSIS — Z85820 Personal history of malignant melanoma of skin: Secondary | ICD-10-CM | POA: Insufficient documentation

## 2017-03-07 DIAGNOSIS — Z66 Do not resuscitate: Secondary | ICD-10-CM

## 2017-03-07 DIAGNOSIS — Z87891 Personal history of nicotine dependence: Secondary | ICD-10-CM

## 2017-03-07 DIAGNOSIS — J189 Pneumonia, unspecified organism: Secondary | ICD-10-CM

## 2017-03-07 DIAGNOSIS — I739 Peripheral vascular disease, unspecified: Secondary | ICD-10-CM

## 2017-03-07 DIAGNOSIS — J90 Pleural effusion, not elsewhere classified: Secondary | ICD-10-CM | POA: Diagnosis not present

## 2017-03-07 DIAGNOSIS — Z7901 Long term (current) use of anticoagulants: Secondary | ICD-10-CM

## 2017-03-07 DIAGNOSIS — I2510 Atherosclerotic heart disease of native coronary artery without angina pectoris: Secondary | ICD-10-CM | POA: Insufficient documentation

## 2017-03-07 DIAGNOSIS — K5790 Diverticulosis of intestine, part unspecified, without perforation or abscess without bleeding: Secondary | ICD-10-CM

## 2017-03-07 DIAGNOSIS — E1151 Type 2 diabetes mellitus with diabetic peripheral angiopathy without gangrene: Secondary | ICD-10-CM | POA: Diagnosis present

## 2017-03-07 DIAGNOSIS — Z833 Family history of diabetes mellitus: Secondary | ICD-10-CM

## 2017-03-07 DIAGNOSIS — R2240 Localized swelling, mass and lump, unspecified lower limb: Secondary | ICD-10-CM | POA: Diagnosis not present

## 2017-03-07 DIAGNOSIS — I701 Atherosclerosis of renal artery: Secondary | ICD-10-CM | POA: Diagnosis present

## 2017-03-07 DIAGNOSIS — Z8249 Family history of ischemic heart disease and other diseases of the circulatory system: Secondary | ICD-10-CM | POA: Insufficient documentation

## 2017-03-07 DIAGNOSIS — J449 Chronic obstructive pulmonary disease, unspecified: Secondary | ICD-10-CM

## 2017-03-07 DIAGNOSIS — I4891 Unspecified atrial fibrillation: Secondary | ICD-10-CM | POA: Diagnosis not present

## 2017-03-07 DIAGNOSIS — I481 Persistent atrial fibrillation: Secondary | ICD-10-CM | POA: Diagnosis not present

## 2017-03-07 DIAGNOSIS — J440 Chronic obstructive pulmonary disease with acute lower respiratory infection: Secondary | ICD-10-CM | POA: Diagnosis present

## 2017-03-07 DIAGNOSIS — Z9861 Coronary angioplasty status: Secondary | ICD-10-CM

## 2017-03-07 DIAGNOSIS — E871 Hypo-osmolality and hyponatremia: Secondary | ICD-10-CM | POA: Diagnosis not present

## 2017-03-07 DIAGNOSIS — M7989 Other specified soft tissue disorders: Secondary | ICD-10-CM

## 2017-03-07 DIAGNOSIS — I130 Hypertensive heart and chronic kidney disease with heart failure and stage 1 through stage 4 chronic kidney disease, or unspecified chronic kidney disease: Secondary | ICD-10-CM | POA: Insufficient documentation

## 2017-03-07 DIAGNOSIS — R069 Unspecified abnormalities of breathing: Secondary | ICD-10-CM | POA: Diagnosis not present

## 2017-03-07 DIAGNOSIS — I5032 Chronic diastolic (congestive) heart failure: Secondary | ICD-10-CM | POA: Insufficient documentation

## 2017-03-07 DIAGNOSIS — E785 Hyperlipidemia, unspecified: Secondary | ICD-10-CM

## 2017-03-07 DIAGNOSIS — Z9849 Cataract extraction status, unspecified eye: Secondary | ICD-10-CM | POA: Insufficient documentation

## 2017-03-07 DIAGNOSIS — E876 Hypokalemia: Secondary | ICD-10-CM | POA: Insufficient documentation

## 2017-03-07 DIAGNOSIS — F5101 Primary insomnia: Secondary | ICD-10-CM

## 2017-03-07 DIAGNOSIS — C3491 Malignant neoplasm of unspecified part of right bronchus or lung: Secondary | ICD-10-CM | POA: Diagnosis present

## 2017-03-07 DIAGNOSIS — Z794 Long term (current) use of insulin: Secondary | ICD-10-CM

## 2017-03-07 DIAGNOSIS — J918 Pleural effusion in other conditions classified elsewhere: Secondary | ICD-10-CM

## 2017-03-07 DIAGNOSIS — Z9889 Other specified postprocedural states: Secondary | ICD-10-CM

## 2017-03-07 DIAGNOSIS — M791 Myalgia: Secondary | ICD-10-CM | POA: Insufficient documentation

## 2017-03-07 DIAGNOSIS — R0602 Shortness of breath: Secondary | ICD-10-CM

## 2017-03-07 DIAGNOSIS — I10 Essential (primary) hypertension: Secondary | ICD-10-CM | POA: Insufficient documentation

## 2017-03-07 DIAGNOSIS — H9190 Unspecified hearing loss, unspecified ear: Secondary | ICD-10-CM | POA: Diagnosis present

## 2017-03-07 DIAGNOSIS — Z515 Encounter for palliative care: Secondary | ICD-10-CM

## 2017-03-07 DIAGNOSIS — Z888 Allergy status to other drugs, medicaments and biological substances status: Secondary | ICD-10-CM | POA: Insufficient documentation

## 2017-03-07 DIAGNOSIS — J441 Chronic obstructive pulmonary disease with (acute) exacerbation: Secondary | ICD-10-CM | POA: Diagnosis present

## 2017-03-07 HISTORY — PX: CARDIOVERSION: EP1203

## 2017-03-07 LAB — HEPATIC FUNCTION PANEL
ALBUMIN: 3.3 g/dL — AB (ref 3.5–5.0)
ALT: 23 U/L (ref 14–54)
AST: 33 U/L (ref 15–41)
Alkaline Phosphatase: 106 U/L (ref 38–126)
BILIRUBIN DIRECT: 0.2 mg/dL (ref 0.1–0.5)
BILIRUBIN TOTAL: 0.8 mg/dL (ref 0.3–1.2)
Indirect Bilirubin: 0.6 mg/dL (ref 0.3–0.9)
Total Protein: 7.2 g/dL (ref 6.5–8.1)

## 2017-03-07 LAB — BASIC METABOLIC PANEL
ANION GAP: 11 (ref 5–15)
BUN: 35 mg/dL — ABNORMAL HIGH (ref 6–20)
CALCIUM: 9 mg/dL (ref 8.9–10.3)
CO2: 29 mmol/L (ref 22–32)
Chloride: 87 mmol/L — ABNORMAL LOW (ref 101–111)
Creatinine, Ser: 1.31 mg/dL — ABNORMAL HIGH (ref 0.44–1.00)
GFR, EST AFRICAN AMERICAN: 41 mL/min — AB (ref >60)
GFR, EST NON AFRICAN AMERICAN: 35 mL/min — AB (ref >60)
GLUCOSE: 243 mg/dL — AB (ref 65–99)
POTASSIUM: 3.2 mmol/L — AB (ref 3.5–5.1)
SODIUM: 127 mmol/L — AB (ref 135–145)

## 2017-03-07 LAB — PROTIME-INR
INR: 1.68
Prothrombin Time: 20 seconds — ABNORMAL HIGH (ref 11.4–15.2)

## 2017-03-07 LAB — CBC WITH DIFFERENTIAL/PLATELET
BASOS ABS: 0 10*3/uL (ref 0–0.1)
Basophils Relative: 0 %
EOS PCT: 1 %
Eosinophils Absolute: 0.1 10*3/uL (ref 0–0.7)
HEMATOCRIT: 36 % (ref 35.0–47.0)
Hemoglobin: 11.9 g/dL — ABNORMAL LOW (ref 12.0–16.0)
LYMPHS ABS: 0.7 10*3/uL — AB (ref 1.0–3.6)
LYMPHS PCT: 3 %
MCH: 27.7 pg (ref 26.0–34.0)
MCHC: 33 g/dL (ref 32.0–36.0)
MCV: 83.9 fL (ref 80.0–100.0)
Monocytes Absolute: 1.1 10*3/uL — ABNORMAL HIGH (ref 0.2–0.9)
Monocytes Relative: 5 %
NEUTROS ABS: 19.8 10*3/uL — AB (ref 1.4–6.5)
Neutrophils Relative %: 91 %
PLATELETS: 278 10*3/uL (ref 150–440)
RBC: 4.29 MIL/uL (ref 3.80–5.20)
RDW: 14.3 % (ref 11.5–14.5)
WBC: 21.8 10*3/uL — AB (ref 3.6–11.0)

## 2017-03-07 LAB — BRAIN NATRIURETIC PEPTIDE: B Natriuretic Peptide: 182 pg/mL — ABNORMAL HIGH (ref 0.0–100.0)

## 2017-03-07 LAB — TROPONIN I: Troponin I: <0.03 ng/mL (ref <0.03)

## 2017-03-07 SURGERY — CARDIOVERSION (CATH LAB)
Anesthesia: General

## 2017-03-07 MED ORDER — SODIUM CHLORIDE 0.9 % IV SOLN
INTRAVENOUS | Status: DC | PRN
  Administered 2017-03-07: 07:00:00 via INTRAVENOUS

## 2017-03-07 MED ORDER — PROPOFOL 10 MG/ML IV BOLUS
INTRAVENOUS | Status: AC
  Filled 2017-03-07: qty 20

## 2017-03-07 MED ORDER — PROPOFOL 10 MG/ML IV BOLUS
INTRAVENOUS | Status: DC | PRN
  Administered 2017-03-07: 20 mg via INTRAVENOUS

## 2017-03-07 NOTE — ED Notes (Signed)
ED Provider at bedside. 

## 2017-03-07 NOTE — Transfer of Care (Signed)
Immediate Anesthesia Transfer of Care Note  Patient: Donna Lane  Procedure(s) Performed: Procedure(s): Cardioversion (N/A)  Patient Location: PACU  Anesthesia Type:General  Level of Consciousness: awake, alert  and oriented  Airway & Oxygen Therapy: Patient Spontanous Breathing and Patient connected to nasal cannula oxygen  Post-op Assessment: Report given to RN and Post -op Vital signs reviewed and stable  Post vital signs: Reviewed and stable  Last Vitals:  Vitals:   03/07/17 0633 03/07/17 0720  BP: (!) 150/84   Pulse: 97 97  Resp: (!) 22 (!) 28  Temp: 36.7 C     Last Pain:  Vitals:   03/07/17 0633  TempSrc: Oral         Complications: No apparent anesthesia complications

## 2017-03-07 NOTE — ED Triage Notes (Signed)
Patient comes in from home via [facility] with generalized edema. Patient was here this morning for a cardioversion for a-fib and was apparently taken off of her HCTZ and Imdur. Patient does take Lasix. Patient also reports having some wheezing. Patient received '10mg'$  og albuterol and atrovent nebulizer treatments. Per EMS patient has COPD as well but not a inhaler. Per EMS wheezing in all fields.

## 2017-03-07 NOTE — CV Procedure (Signed)
Cardioversion procedure note °For atrial fibrillation, persistent. ° °Procedure Details: ° °Consent: Risks of procedure as well as the alternatives and risks of each were explained to the (patient/caregiver).  Consent for procedure obtained. ° °Time Out: Verified patient identification, verified procedure, site/side was marked, verified correct patient position, special equipment/implants available, medications/allergies/relevent history reviewed, required imaging and test results available.  Performed ° °Patient placed on cardiac monitor, pulse oximetry, supplemental oxygen as necessary.   °Sedation given: propofol IV, Dr. Adams °Pacer pads placed anterior and posterior chest. ° ° °Cardioverted 1 time(s).   °Cardioverted at  150 J. Synchronized biphasic °Converted to NSR ° ° °Evaluation: °Findings: Post procedure EKG shows: NSR °Complications: None °Patient did tolerate procedure well. ° °Time Spent Directly with the Patient: ° °45 minutes  ° °Tim Gollan, M.D., Ph.D.  °

## 2017-03-07 NOTE — Anesthesia Post-op Follow-up Note (Cosign Needed)
Anesthesia QCDR form completed.        

## 2017-03-07 NOTE — Anesthesia Preprocedure Evaluation (Signed)
Anesthesia Evaluation  Patient identified by MRN, date of birth, ID band Patient awake    Reviewed: Allergy & Precautions, H&P , NPO status , Patient's Chart, lab work & pertinent test results, reviewed documented beta blocker date and time   Airway Mallampati: II   Neck ROM: full    Dental  (+) Poor Dentition   Pulmonary neg pulmonary ROS, COPD,  COPD inhaler, former smoker,    Pulmonary exam normal        Cardiovascular hypertension, + CAD and + Peripheral Vascular Disease  negative cardio ROS Normal cardiovascular examAtrial Fibrillation  Rhythm:regular Rate:Normal     Neuro/Psych  Neuromuscular disease negative neurological ROS  negative psych ROS   GI/Hepatic negative GI ROS, Neg liver ROS,   Endo/Other  negative endocrine ROSdiabetes  Renal/GU ARFnegative Renal ROS  negative genitourinary   Musculoskeletal   Abdominal   Peds  Hematology negative hematology ROS (+)   Anesthesia Other Findings Past Medical History: No date: Arrhythmia     Comment: atrial fibrillation No date: Chronic diastolic heart failure (HCC) No date: CKD (chronic kidney disease), stage III No date: COPD (chronic obstructive pulmonary disease) (* 2008: Coronary artery disease     Comment: CABG x 4,  No date: Diabetes mellitus No date: Diverticulosis     Comment: on colonoscopy 08/12/2006 No date: Hyperlipidemia No date: Hypertension No date: Melanoma ([place])     Comment: removed by derm  No date: PVD (peripheral vascular disease) (HCC) No date: Spinal stenosis Past Surgical History: No date: CARDIAC CATHETERIZATION     Comment: PTCA x 3 No date: CATARACT EXTRACTION 2008: CORONARY ARTERY BYPASS GRAFT     Comment: x 4 No date: HIP FRACTURE SURGERY Left     Comment: pins placed w/o hip replacement, hardware               removed later No date: LUMBAR DISC SURGERY     Comment: in her 63s No date: tonsillectomy No date: VAGINAL  DELIVERY     Comment: x2 BMI    Body Mass Index:  23.24 kg/m     Reproductive/Obstetrics negative OB ROS                             Anesthesia Physical Anesthesia Plan  ASA: IV  Anesthesia Plan: General   Post-op Pain Management:    Induction:   Airway Management Planned:   Additional Equipment:   Intra-op Plan:   Post-operative Plan:   Informed Consent: I have reviewed the patients History and Physical, chart, labs and discussed the procedure including the risks, benefits and alternatives for the proposed anesthesia with the patient or authorized representative who has indicated his/her understanding and acceptance.   Dental Advisory Given  Plan Discussed with: CRNA  Anesthesia Plan Comments:         Anesthesia Quick Evaluation

## 2017-03-07 NOTE — ED Notes (Signed)
Pt given water. Ok per EDP

## 2017-03-07 NOTE — ED Notes (Signed)
Patient does not wear O2 at home. States she has some she can wear at night but doesn't because it makes her so dry she can't tolerate it.

## 2017-03-08 DIAGNOSIS — I10 Essential (primary) hypertension: Secondary | ICD-10-CM | POA: Diagnosis not present

## 2017-03-08 DIAGNOSIS — M6281 Muscle weakness (generalized): Secondary | ICD-10-CM | POA: Diagnosis not present

## 2017-03-08 DIAGNOSIS — E785 Hyperlipidemia, unspecified: Secondary | ICD-10-CM

## 2017-03-08 DIAGNOSIS — M7989 Other specified soft tissue disorders: Secondary | ICD-10-CM | POA: Diagnosis not present

## 2017-03-08 DIAGNOSIS — F5101 Primary insomnia: Secondary | ICD-10-CM | POA: Diagnosis not present

## 2017-03-08 DIAGNOSIS — J159 Unspecified bacterial pneumonia: Secondary | ICD-10-CM | POA: Diagnosis not present

## 2017-03-08 DIAGNOSIS — Z7901 Long term (current) use of anticoagulants: Secondary | ICD-10-CM | POA: Diagnosis not present

## 2017-03-08 DIAGNOSIS — R1312 Dysphagia, oropharyngeal phase: Secondary | ICD-10-CM | POA: Diagnosis not present

## 2017-03-08 DIAGNOSIS — I5032 Chronic diastolic (congestive) heart failure: Secondary | ICD-10-CM | POA: Diagnosis present

## 2017-03-08 DIAGNOSIS — N183 Chronic kidney disease, stage 3 (moderate): Secondary | ICD-10-CM

## 2017-03-08 DIAGNOSIS — I2510 Atherosclerotic heart disease of native coronary artery without angina pectoris: Secondary | ICD-10-CM

## 2017-03-08 DIAGNOSIS — E119 Type 2 diabetes mellitus without complications: Secondary | ICD-10-CM | POA: Diagnosis not present

## 2017-03-08 DIAGNOSIS — Z79899 Other long term (current) drug therapy: Secondary | ICD-10-CM

## 2017-03-08 DIAGNOSIS — R062 Wheezing: Secondary | ICD-10-CM | POA: Diagnosis not present

## 2017-03-08 DIAGNOSIS — R0602 Shortness of breath: Secondary | ICD-10-CM | POA: Diagnosis not present

## 2017-03-08 DIAGNOSIS — A419 Sepsis, unspecified organism: Secondary | ICD-10-CM | POA: Diagnosis present

## 2017-03-08 DIAGNOSIS — R2232 Localized swelling, mass and lump, left upper limb: Secondary | ICD-10-CM | POA: Diagnosis not present

## 2017-03-08 DIAGNOSIS — H9190 Unspecified hearing loss, unspecified ear: Secondary | ICD-10-CM | POA: Diagnosis present

## 2017-03-08 DIAGNOSIS — I5030 Unspecified diastolic (congestive) heart failure: Secondary | ICD-10-CM | POA: Diagnosis not present

## 2017-03-08 DIAGNOSIS — Z9861 Coronary angioplasty status: Secondary | ICD-10-CM | POA: Diagnosis not present

## 2017-03-08 DIAGNOSIS — Z951 Presence of aortocoronary bypass graft: Secondary | ICD-10-CM | POA: Diagnosis not present

## 2017-03-08 DIAGNOSIS — I481 Persistent atrial fibrillation: Secondary | ICD-10-CM | POA: Diagnosis present

## 2017-03-08 DIAGNOSIS — Y95 Nosocomial condition: Secondary | ICD-10-CM | POA: Diagnosis present

## 2017-03-08 DIAGNOSIS — J441 Chronic obstructive pulmonary disease with (acute) exacerbation: Secondary | ICD-10-CM | POA: Diagnosis not present

## 2017-03-08 DIAGNOSIS — R918 Other nonspecific abnormal finding of lung field: Secondary | ICD-10-CM | POA: Diagnosis not present

## 2017-03-08 DIAGNOSIS — E1122 Type 2 diabetes mellitus with diabetic chronic kidney disease: Secondary | ICD-10-CM | POA: Diagnosis present

## 2017-03-08 DIAGNOSIS — Z515 Encounter for palliative care: Secondary | ICD-10-CM | POA: Diagnosis not present

## 2017-03-08 DIAGNOSIS — Z794 Long term (current) use of insulin: Secondary | ICD-10-CM | POA: Diagnosis not present

## 2017-03-08 DIAGNOSIS — D519 Vitamin B12 deficiency anemia, unspecified: Secondary | ICD-10-CM | POA: Diagnosis not present

## 2017-03-08 DIAGNOSIS — E784 Other hyperlipidemia: Secondary | ICD-10-CM | POA: Diagnosis not present

## 2017-03-08 DIAGNOSIS — E871 Hypo-osmolality and hyponatremia: Secondary | ICD-10-CM | POA: Diagnosis not present

## 2017-03-08 DIAGNOSIS — R498 Other voice and resonance disorders: Secondary | ICD-10-CM | POA: Diagnosis not present

## 2017-03-08 DIAGNOSIS — I4891 Unspecified atrial fibrillation: Secondary | ICD-10-CM | POA: Diagnosis not present

## 2017-03-08 DIAGNOSIS — Z5189 Encounter for other specified aftercare: Secondary | ICD-10-CM | POA: Diagnosis not present

## 2017-03-08 DIAGNOSIS — J189 Pneumonia, unspecified organism: Secondary | ICD-10-CM

## 2017-03-08 DIAGNOSIS — C3491 Malignant neoplasm of unspecified part of right bronchus or lung: Secondary | ICD-10-CM | POA: Diagnosis not present

## 2017-03-08 DIAGNOSIS — J449 Chronic obstructive pulmonary disease, unspecified: Secondary | ICD-10-CM

## 2017-03-08 DIAGNOSIS — R2681 Unsteadiness on feet: Secondary | ICD-10-CM | POA: Diagnosis not present

## 2017-03-08 DIAGNOSIS — Z66 Do not resuscitate: Secondary | ICD-10-CM | POA: Diagnosis not present

## 2017-03-08 DIAGNOSIS — R41841 Cognitive communication deficit: Secondary | ICD-10-CM | POA: Diagnosis not present

## 2017-03-08 DIAGNOSIS — Z85820 Personal history of malignant melanoma of skin: Secondary | ICD-10-CM | POA: Diagnosis not present

## 2017-03-08 DIAGNOSIS — J90 Pleural effusion, not elsewhere classified: Secondary | ICD-10-CM | POA: Diagnosis not present

## 2017-03-08 DIAGNOSIS — Z87891 Personal history of nicotine dependence: Secondary | ICD-10-CM

## 2017-03-08 DIAGNOSIS — I701 Atherosclerosis of renal artery: Secondary | ICD-10-CM | POA: Diagnosis present

## 2017-03-08 DIAGNOSIS — E1165 Type 2 diabetes mellitus with hyperglycemia: Secondary | ICD-10-CM | POA: Diagnosis not present

## 2017-03-08 DIAGNOSIS — F339 Major depressive disorder, recurrent, unspecified: Secondary | ICD-10-CM | POA: Diagnosis not present

## 2017-03-08 DIAGNOSIS — J918 Pleural effusion in other conditions classified elsewhere: Secondary | ICD-10-CM | POA: Diagnosis not present

## 2017-03-08 DIAGNOSIS — J440 Chronic obstructive pulmonary disease with acute lower respiratory infection: Secondary | ICD-10-CM | POA: Diagnosis present

## 2017-03-08 DIAGNOSIS — E1151 Type 2 diabetes mellitus with diabetic peripheral angiopathy without gangrene: Secondary | ICD-10-CM | POA: Diagnosis present

## 2017-03-08 DIAGNOSIS — I739 Peripheral vascular disease, unspecified: Secondary | ICD-10-CM

## 2017-03-08 DIAGNOSIS — I509 Heart failure, unspecified: Secondary | ICD-10-CM | POA: Diagnosis not present

## 2017-03-08 DIAGNOSIS — Z9981 Dependence on supplemental oxygen: Secondary | ICD-10-CM | POA: Diagnosis not present

## 2017-03-08 DIAGNOSIS — I130 Hypertensive heart and chronic kidney disease with heart failure and stage 1 through stage 4 chronic kidney disease, or unspecified chronic kidney disease: Secondary | ICD-10-CM | POA: Diagnosis present

## 2017-03-08 DIAGNOSIS — J9811 Atelectasis: Secondary | ICD-10-CM | POA: Diagnosis not present

## 2017-03-08 LAB — BLOOD GAS, VENOUS
Acid-Base Excess: 6.3 mmol/L — ABNORMAL HIGH (ref 0.0–2.0)
Bicarbonate: 31.8 mmol/L — ABNORMAL HIGH (ref 20.0–28.0)
FIO2: 0.21
O2 Saturation: 65 %
PH VEN: 7.42 (ref 7.250–7.430)
Patient temperature: 37
pCO2, Ven: 49 mmHg (ref 44.0–60.0)
pO2, Ven: 33 mmHg (ref 32.0–45.0)

## 2017-03-08 LAB — GLUCOSE, CAPILLARY
Glucose-Capillary: 228 mg/dL — ABNORMAL HIGH (ref 65–99)
Glucose-Capillary: 199 mg/dL — ABNORMAL HIGH (ref 65–99)
Glucose-Capillary: 229 mg/dL — ABNORMAL HIGH (ref 65–99)
Glucose-Capillary: 176 mg/dL — ABNORMAL HIGH (ref 65–99)

## 2017-03-08 LAB — TSH: TSH: 5.73 u[IU]/mL — ABNORMAL HIGH (ref 0.350–4.500)

## 2017-03-08 LAB — MRSA PCR SCREENING: MRSA by PCR: POSITIVE — AB

## 2017-03-08 MED ORDER — MUPIROCIN 2 % EX OINT
1.0000 "application " | TOPICAL_OINTMENT | Freq: Two times a day (BID) | CUTANEOUS | Status: AC
  Administered 2017-03-08 – 2017-03-12 (×10): 1 via NASAL
  Filled 2017-03-08: qty 22

## 2017-03-08 MED ORDER — SODIUM CHLORIDE 0.9% FLUSH
3.0000 mL | Freq: Two times a day (BID) | INTRAVENOUS | Status: DC
  Administered 2017-03-09 – 2017-03-13 (×7): 3 mL via INTRAVENOUS

## 2017-03-08 MED ORDER — CHLORHEXIDINE GLUCONATE CLOTH 2 % EX PADS
6.0000 | MEDICATED_PAD | Freq: Every day | CUTANEOUS | Status: AC
  Administered 2017-03-08 – 2017-03-12 (×5): 6 via TOPICAL

## 2017-03-08 MED ORDER — ONDANSETRON HCL 4 MG/2ML IJ SOLN
4.0000 mg | Freq: Four times a day (QID) | INTRAMUSCULAR | Status: DC | PRN
  Administered 2017-03-09: 4 mg via INTRAVENOUS
  Filled 2017-03-08: qty 2

## 2017-03-08 MED ORDER — INSULIN DETEMIR 100 UNIT/ML ~~LOC~~ SOLN
2.0000 [IU] | Freq: Every day | SUBCUTANEOUS | Status: DC
  Administered 2017-03-08 – 2017-03-10 (×3): 2 [IU] via SUBCUTANEOUS
  Filled 2017-03-08 (×4): qty 0.02

## 2017-03-08 MED ORDER — APIXABAN 2.5 MG PO TABS
2.5000 mg | ORAL_TABLET | Freq: Two times a day (BID) | ORAL | Status: DC
  Administered 2017-03-08 – 2017-03-13 (×11): 2.5 mg via ORAL
  Filled 2017-03-08 (×12): qty 1

## 2017-03-08 MED ORDER — ACETAMINOPHEN 325 MG PO TABS
650.0000 mg | ORAL_TABLET | Freq: Four times a day (QID) | ORAL | Status: DC | PRN
  Administered 2017-03-09: 22:00:00 650 mg via ORAL
  Filled 2017-03-08: qty 2

## 2017-03-08 MED ORDER — ACETAMINOPHEN 650 MG RE SUPP: 650.0000 mg | Freq: Four times a day (QID) | RECTAL | Status: DC | PRN

## 2017-03-08 MED ORDER — SODIUM CHLORIDE 0.9 % IV SOLN
INTRAVENOUS | Status: DC
Start: 2017-03-08 — End: 2017-03-10
  Administered 2017-03-08 – 2017-03-09 (×4): via INTRAVENOUS

## 2017-03-08 MED ORDER — ATORVASTATIN CALCIUM 20 MG PO TABS
10.0000 mg | ORAL_TABLET | Freq: Every day | ORAL | Status: DC
  Administered 2017-03-08 – 2017-03-13 (×6): 10 mg via ORAL
  Filled 2017-03-08 (×6): qty 1

## 2017-03-08 MED ORDER — VITAMIN B-12 1000 MCG PO TABS
1000.0000 ug | ORAL_TABLET | Freq: Every day | ORAL | Status: DC
  Administered 2017-03-08 – 2017-03-13 (×6): 1000 ug via ORAL
  Filled 2017-03-08 (×6): qty 1

## 2017-03-08 MED ORDER — PIPERACILLIN-TAZOBACTAM 3.375 G IVPB 30 MIN
3.3750 g | Freq: Once | INTRAVENOUS | Status: AC
  Administered 2017-03-08: 3.375 g via INTRAVENOUS
  Filled 2017-03-08: qty 50

## 2017-03-08 MED ORDER — INSULIN ASPART 100 UNIT/ML ~~LOC~~ SOLN
0.0000 [IU] | Freq: Three times a day (TID) | SUBCUTANEOUS | Status: DC
  Administered 2017-03-08: 12:00:00 3 [IU] via SUBCUTANEOUS
  Administered 2017-03-08 (×2): 2 [IU] via SUBCUTANEOUS
  Administered 2017-03-09: 3 [IU] via SUBCUTANEOUS
  Administered 2017-03-09: 09:00:00 2 [IU] via SUBCUTANEOUS
  Administered 2017-03-09 – 2017-03-10 (×3): 3 [IU] via SUBCUTANEOUS
  Administered 2017-03-10: 5 [IU] via SUBCUTANEOUS
  Administered 2017-03-11 (×2): 3 [IU] via SUBCUTANEOUS
  Filled 2017-03-08: qty 3
  Filled 2017-03-08: qty 2
  Filled 2017-03-08 (×6): qty 3
  Filled 2017-03-08: qty 5
  Filled 2017-03-08 (×2): qty 2

## 2017-03-08 MED ORDER — DOCUSATE SODIUM 100 MG PO CAPS
100.0000 mg | ORAL_CAPSULE | Freq: Two times a day (BID) | ORAL | Status: DC
  Administered 2017-03-08 – 2017-03-12 (×11): 100 mg via ORAL
  Filled 2017-03-08 (×12): qty 1

## 2017-03-08 MED ORDER — NITROGLYCERIN 0.4 MG SL SUBL: 0.4000 mg | SUBLINGUAL_TABLET | SUBLINGUAL | Status: DC | PRN

## 2017-03-08 MED ORDER — PIPERACILLIN-TAZOBACTAM 3.375 G IVPB
3.3750 g | Freq: Three times a day (TID) | INTRAVENOUS | Status: DC
  Administered 2017-03-08 – 2017-03-10 (×7): 3.375 g via INTRAVENOUS
  Filled 2017-03-08 (×7): qty 50

## 2017-03-08 MED ORDER — VANCOMYCIN HCL IN DEXTROSE 750-5 MG/150ML-% IV SOLN
750.0000 mg | INTRAVENOUS | Status: DC
  Filled 2017-03-08: qty 150

## 2017-03-08 MED ORDER — VANCOMYCIN HCL IN DEXTROSE 1-5 GM/200ML-% IV SOLN
1000.0000 mg | Freq: Once | INTRAVENOUS | Status: AC
  Administered 2017-03-08: 1000 mg via INTRAVENOUS
  Filled 2017-03-08: qty 200

## 2017-03-08 MED ORDER — ZOLPIDEM TARTRATE 5 MG PO TABS
5.0000 mg | ORAL_TABLET | Freq: Every evening | ORAL | Status: DC | PRN
  Administered 2017-03-08: 5 mg via ORAL
  Filled 2017-03-08: qty 1

## 2017-03-08 MED ORDER — AMIODARONE HCL 200 MG PO TABS
200.0000 mg | ORAL_TABLET | Freq: Two times a day (BID) | ORAL | Status: DC
  Administered 2017-03-08 – 2017-03-13 (×12): 200 mg via ORAL
  Filled 2017-03-08 (×12): qty 1

## 2017-03-08 MED ORDER — ONDANSETRON HCL 4 MG PO TABS: 4.0000 mg | ORAL_TABLET | Freq: Four times a day (QID) | ORAL | Status: DC | PRN

## 2017-03-08 MED ORDER — DILTIAZEM HCL ER COATED BEADS 240 MG PO CP24
240.0000 mg | ORAL_CAPSULE | Freq: Every day | ORAL | Status: DC
  Administered 2017-03-08 – 2017-03-13 (×6): 240 mg via ORAL
  Filled 2017-03-08 (×6): qty 1

## 2017-03-08 NOTE — ED Provider Notes (Signed)
[facility] Emergency Department Provider Note   ____________________________________________   First MD Initiated Contact with Patient 03/07/17 2307     (approximate)  I have reviewed the triage vital signs and the nursing notes.   HISTORY  Chief Complaint Edema    HPI Donna Lane is a 83 y.o. female who comes into the hospital today with some swelling in her legs. She reports this coming up her legs into her abdomen and hands. She states it started a couple of days ago. The patient was taken off of her Lasix around the same time. She's not sure why she was taken off it was done by her cardiologist Dr. Pamala Hurry in. The patient had a cardioversion for atrial fibrillation today and she denies any pain at this time. She reports that her chest little sore but not severe. She's had some shortness of breath as well before they shocked her heart. She is also had occasional dizziness. She denies any nausea or vomiting. The patient was concerned because she is been swelling in her legs so she decided to come into the hospital today for evaluation of swelling.   Past Medical History:  Diagnosis Date  . Arrhythmia    atrial fibrillation  . Chronic diastolic heart failure ([place])   . CKD (chronic kidney disease), stage III   . COPD (chronic obstructive pulmonary disease) ([place])   . Coronary artery disease 2008   CABG x 4,   . Diabetes mellitus   . Diverticulosis    on colonoscopy 08/12/2006  . Hyperlipidemia   . Hypertension   . Melanoma ([place])    removed by derm   . PVD (peripheral vascular disease) ([place])   . Spinal stenosis     Patient Active Problem List   Diagnosis Date Noted  . Sepsis ([place]) 03/08/2017  . Edema 02/23/2017  . Chronic diastolic heart failure ([place]) 01/19/2017  . Atrial fibrillation with rapid ventricular response ([place]) 01/06/2017  . Acute respiratory distress 01/06/2017  . COPD (chronic obstructive pulmonary disease) ([place]) 01/06/2017  .  Hypokalemia 01/06/2017  . Elevated troponin 11/29/2016  . Acute renal insufficiency 11/29/2016  . Hyponatremia 11/29/2016  . Lung mass 11/29/2016  . Atelectasis of right lung 11/29/2016  . Left eye complaint 07/14/2016  . Caregiver stress 07/14/2016  . Olecranon bursitis of right elbow 06/19/2016  . Myalgia and myositis 05/20/2016  . Chronic kidney disease, stage 3 01/09/2016  . Advance care planning 01/05/2016  . Spinal stenosis of lumbar region 01/05/2016  . Diabetes mellitus type 2, uncontrolled, with complications ([place]) 36/46/8032  . Hyperlipidemia 03/14/2010  . Essential hypertension 03/14/2010  . CORONARY ATHEROSLERO AUTOL VEIN BYPASS GRAFT 03/14/2010  . Peripheral arterial disease ([place]) 03/14/2010  . Chest pain 03/14/2010    Past Surgical History:  Procedure Laterality Date  . CARDIAC CATHETERIZATION     PTCA x 3  . CATARACT EXTRACTION    . CORONARY ARTERY BYPASS GRAFT  2008   x 4  . HIP FRACTURE SURGERY Left    pins placed w/o hip replacement, hardware removed later  . LUMBAR DISC SURGERY     in her 57s  . tonsillectomy    . VAGINAL DELIVERY     x2    Prior to Admission medications   Medication Sig Start Date End Date Taking? Authorizing Provider  acetaminophen (TYLENOL) 500 MG tablet Take 500 mg by mouth 2 (two) times daily.   Yes Historical Provider, MD  amiodarone (PACERONE) 200 MG tablet Take 1  tablet (200 mg total) by mouth 2 (two) times daily. 02/14/17  Yes Minna Merritts, MD  apixaban (ELIQUIS) 2.5 MG TABS tablet Take 1 tablet (2.5 mg total) by mouth 2 (two) times daily. 01/23/17  Yes Minna Merritts, MD  atorvastatin (LIPITOR) 10 MG tablet Take 1 tablet (10 mg total) by mouth daily at 6 PM. 01/23/17  Yes Minna Merritts, MD  cyanocobalamin 1000 MCG tablet Take 1,000 mcg by mouth daily.   Yes Historical Provider, MD  diltiazem (CARDIZEM CD) 240 MG 24 hr capsule Take 1 capsule (240 mg total) by mouth daily. 01/23/17  Yes Minna Merritts, MD  glipiZIDE  (GLUCOTROL XL) 5 MG 24 hr tablet Take 1 tablet (5 mg total) by mouth every morning. 02/28/17  Yes Tonia Ghent, MD  Insulin Detemir (LEVEMIR) 100 UNIT/ML Pen Inject as directed once a day Patient taking differently: Inject 3-5 Units into the skin at bedtime as needed (for blood sugar reading greater than 150).  02/21/17  Yes Tonia Ghent, MD  nitroGLYCERIN (NITROSTAT) 0.4 MG SL tablet Place 1 tablet (0.4 mg total) under the tongue every 5 (five) minutes as needed for chest pain. 01/23/17  Yes Minna Merritts, MD    Allergies Nsaids  Family History  Problem Relation Age of Onset  . Heart disease Father   . Heart disease Sister   . Diabetes Sister     Social History Social History  Substance Use Topics  . Smoking status: Former Smoker    Packs/day: 1.00    Years: 50.00    Types: Cigarettes    Quit date: 03/20/2007  . Smokeless tobacco: Never Used  . Alcohol use No    Review of Systems Constitutional: No fever/chills Eyes: No visual changes. ENT: No sore throat. Cardiovascular: Denies chest pain. Respiratory: mild shortness of breath. Gastrointestinal: No abdominal pain.  No nausea, no vomiting.  No diarrhea.  No constipation. Genitourinary: Negative for dysuria. Musculoskeletal: Negative for back pain. Skin: Negative for rash. Neurological: Negative for headaches, focal weakness or numbness. Lymph: Lower extremity swelling  10-point ROS otherwise negative.  ____________________________________________   PHYSICAL EXAM:  VITAL SIGNS: ED Triage Vitals  Enc Vitals Group     BP 03/07/17 2232 (!) 153/65     Pulse --      Resp 03/07/17 2232 (!) 23     Temp 03/07/17 2232 97.9 F (36.6 C)     Temp Source 03/07/17 2232 Oral     SpO2 03/07/17 2232 95 %     Weight 03/07/17 2238 123 lb (55.8 kg)     Height 03/07/17 2238 '5\' 1"'$  (1.549 m)     Head Circumference --      Peak Flow --      Pain Score --      Pain Loc --      Pain Edu? --      Excl. in [place]? --      Constitutional: Alert and oriented. Well appearing and in mild distress. Eyes: Conjunctivae are normal. PERRL. EOMI. Head: Atraumatic. Nose: No congestion/rhinnorhea. Mouth/Throat: Mucous membranes are moist.  Oropharynx non-erythematous. Cardiovascular: Normal rate, regular rhythm. Grossly normal heart sounds.  Good peripheral circulation. Respiratory: Normal respiratory effort.  No retractions. Lungs CTAB. Gastrointestinal: Soft and nontender. No distention. Positive bowel sounds Musculoskeletal: Bilateral lower extremity edema Neurologic:  Normal speech and language.  Skin:  Skin is warm, dry and intact.  Psychiatric: Mood and affect are normal.   ____________________________________________   LABS (all  labs ordered are listed, but only abnormal results are displayed)  Labs Reviewed  MRSA PCR SCREENING - Abnormal; Notable for the following:       Result Value   MRSA by PCR POSITIVE (*)    All other components within normal limits  BASIC METABOLIC PANEL - Abnormal; Notable for the following:    Sodium 127 (*)    Potassium 3.2 (*)    Chloride 87 (*)    Glucose, Bld 243 (*)    BUN 35 (*)    Creatinine, Ser 1.31 (*)    GFR calc non Af Amer 35 (*)    GFR calc Af Amer 41 (*)    All other components within normal limits  HEPATIC FUNCTION PANEL - Abnormal; Notable for the following:    Albumin 3.3 (*)    All other components within normal limits  BRAIN NATRIURETIC PEPTIDE - Abnormal; Notable for the following:    B Natriuretic Peptide 182.0 (*)    All other components within normal limits  CBC WITH DIFFERENTIAL/PLATELET - Abnormal; Notable for the following:    WBC 21.8 (*)    Hemoglobin 11.9 (*)    Neutro Abs 19.8 (*)    Lymphs Abs 0.7 (*)    Monocytes Absolute 1.1 (*)    All other components within normal limits  PROTIME-INR - Abnormal; Notable for the following:    Prothrombin Time 20.0 (*)    All other components within normal limits  BLOOD GAS, VENOUS -  Abnormal; Notable for the following:    Bicarbonate 31.8 (*)    Acid-Base Excess 6.3 (*)    All other components within normal limits  TSH - Abnormal; Notable for the following:    TSH 5.730 (*)    All other components within normal limits  GLUCOSE, CAPILLARY - Abnormal; Notable for the following:    Glucose-Capillary 228 (*)    All other components within normal limits  GLUCOSE, CAPILLARY - Abnormal; Notable for the following:    Glucose-Capillary 199 (*)    All other components within normal limits  CULTURE, BLOOD (ROUTINE X 2)  CULTURE, BLOOD (ROUTINE X 2)  TROPONIN I   ____________________________________________  EKG  ED ECG REPORT I, Loney Hering, the attending physician, personally viewed and interpreted this ECG.   Date: 03/07/2017  EKG Time: 2250  Rate: 79  Rhythm: normal sinus rhythm  Axis: normal  Intervals:prolonged qtc  ST&T Change: diffuse t wave flattening  ____________________________________________  RADIOLOGY  CXR ____________________________________________   PROCEDURES  Procedure(s) performed: None  Procedures  Critical Care performed: No  ____________________________________________   INITIAL IMPRESSION / ASSESSMENT AND PLAN / ED COURSE  Pertinent labs & imaging results that were available during my care of the patient were reviewed by me and considered in my medical decision making (see chart for details).  This is an 83 year old female who comes into the hospital today with some swelling in her legs. The patient reports that she's been off her Lasix which is likely the cause of the swelling. The patient had some blood work done and it was shown that her white blood cell count was 21.8. The patient on her chest x-ray appears to have a moderate right pleural effusion with some right middle lobe dense consolidation. Given the patient's white blood cell count and concern for possible pneumonia. The patient did have a procedure done  today and was in the hospital and January. I did check a BNP which was negative at 182 but  given the patient's leg swelling and the fact that she has not had Lasix and a few days I will M concerned about Lasix. I will give the patient some vancomycin and Zosyn for her pneumonia and given her symptoms I will admit her to the hospitalist service. The patient has no other concerns at this time. The patient did have a lung mass on her previous CT back in December which could also be causing some of these symptoms. She will be admitted.  Clinical Course as of Mar 09 847  Fri Mar 07, 2017  2317 1. Small left pleural effusion. Suspect a moderate right pleural effusion. 2. Dense right middle lobe and lung base consolidation 3. Cardiomegaly   DG Chest 2 View [AW]    Clinical Course User Index [AW] Loney Hering, MD     ____________________________________________   FINAL CLINICAL IMPRESSION(S) / ED DIAGNOSES  Final diagnoses:  Peripheral edema  Pleural effusion associated with pulmonary infection  Healthcare-associated pneumonia      NEW MEDICATIONS STARTED DURING THIS VISIT:  Current Discharge Medication List       Note:  This document was prepared using Dragon voice recognition software and may include unintentional dictation errors.    Loney Hering, MD 03/08/17 [phone]

## 2017-03-08 NOTE — Consult Note (Signed)
[facility]  Date of admission:  03/08/2017  Inpatient day:  03/08/2017  Consulting physician: Dr. Rosilyn Mings   Reason for Consultation:  Lung mass  Chief Complaint: Donna Lane is a 83 y.o. female with a post obstructive pneumonia likely secondary to underlying lung cancer who was admitted with sepsis.  HPI: The patient has a 50 pack-year smoking history. She stopped smoking 9 years ago.  She was admitted to [facility] on 11/28/2016 with chest pain and pressure. Chest x-ray revealed a right middle lobe collapse or consolidation. Chest CT on 11/28/2016 revealed a central obstructing mass and subsequent right middle lobe collapse.  She declined an evaluation as she did not believe she had cancer.  She was readmitted to [facility] on 01/06/2017 with acute respiratory distress and new onset atrial fibrillation. She was seen by Dr. Mortimer Fries and declined further work-up or imaging.  She underwent cardioversion for uncontrolled fibrillation yesterday. She felt fatigued and had lower extremity edema. She presented to the emergency room.  CXR revealed a moderate right sided pleural effusion and dense right middle lobe and lung base consolidation.  She met sepsis criteria with leukocytosis and tachypnea. Blood cultures were drawn. She is being treated or a health care associated postobstructive pneumonia with vancomycin and Zosyn. She has hyponatremia.  Symptomatically, she notes fatigue and shortness of breath. She has a poor appetite.  She denies any weight loss secondary to edema.     Past Medical History:  Diagnosis Date  . Arrhythmia    atrial fibrillation  . Chronic diastolic heart failure ([place])   . CKD (chronic kidney disease), stage III   . COPD (chronic obstructive pulmonary disease) ([place])   . Coronary artery disease 2008   CABG x 4,   . Diabetes mellitus   . Diverticulosis    on colonoscopy 08/12/2006  . Hyperlipidemia   . Hypertension   . Melanoma ([place])    removed  by derm   . PVD (peripheral vascular disease) ([place])   . Spinal stenosis     Past Surgical History:  Procedure Laterality Date  . CARDIAC CATHETERIZATION     PTCA x 3  . CATARACT EXTRACTION    . CORONARY ARTERY BYPASS GRAFT  2008   x 4  . HIP FRACTURE SURGERY Left    pins placed w/o hip replacement, hardware removed later  . LUMBAR DISC SURGERY     in her 7s  . tonsillectomy    . VAGINAL DELIVERY     x2    Family History  Problem Relation Age of Onset  . Heart disease Father   . Heart disease Sister   . Diabetes Sister     Social History:  reports that she quit smoking about 9 years ago. Her smoking use included Cigarettes. She has a 50.00 pack-year smoking history. She has never used smokeless tobacco. She reports that she does not drink alcohol or use drugs.  She lives with her husband who she describes as an invalid.  She has been caring for him > 1 year.  Allergies:  Allergies  Allergen Reactions  . Nsaids Other (See Comments)    Would avoid due to CKD.... Pt states she not allergic    Medications Prior to Admission  Medication Sig Dispense Refill  . acetaminophen (TYLENOL) 500 MG tablet Take 500 mg by mouth 2 (two) times daily.    Marland Kitchen amiodarone (PACERONE) 200 MG tablet Take 1 tablet (200 mg total) by mouth 2 (two) times daily.  70 tablet 3  . apixaban (ELIQUIS) 2.5 MG TABS tablet Take 1 tablet (2.5 mg total) by mouth 2 (two) times daily. 180 tablet 3  . atorvastatin (LIPITOR) 10 MG tablet Take 1 tablet (10 mg total) by mouth daily at 6 PM. 30 tablet 6  . cyanocobalamin 1000 MCG tablet Take 1,000 mcg by mouth daily.    Marland Kitchen diltiazem (CARDIZEM CD) 240 MG 24 hr capsule Take 1 capsule (240 mg total) by mouth daily. 30 capsule 6  . glipiZIDE (GLUCOTROL XL) 5 MG 24 hr tablet Take 1 tablet (5 mg total) by mouth every morning. 90 tablet 2  . Insulin Detemir (LEVEMIR) 100 UNIT/ML Pen Inject as directed once a day (Patient taking differently: Inject 3-5 Units into the skin at  bedtime as needed (for blood sugar reading greater than 150). )    . nitroGLYCERIN (NITROSTAT) 0.4 MG SL tablet Place 1 tablet (0.4 mg total) under the tongue every 5 (five) minutes as needed for chest pain. 25 tablet 6    Review of Systems: GENERAL:  Fatigue. No fevers or sweats.  Fluid weight gain. PERFORMANCE STATUS (ECOG):  1 HEENT:  No visual changes, runny nose, sore throat, mouth sores or tenderness. Lungs: Shortness of breath.  Cough.  No hemoptysis. Cardiac:  No chest pain, palpitations, orthopnea, or PND.  Atrial fibrillation s/p cardioversion. GI:  Poor appetite.  No nausea, vomiting, diarrhea, constipation, melena or hematochezia. GU:  No urgency, frequency, dysuria, or hematuria. Musculoskeletal:  Mid thoracic back pain.  No joint pain.  No muscle tenderness. Extremities: Lower extremity swelling. Skin:  No rashes or skin changes. Neuro:  No headache, numbness or weakness, balance or coordination issues. Endocrine:  Diabetes.  No thyroid issues, hot flashes or night sweats. Psych:  No mood changes, depression or anxiety. Pain:  Mild upper back pain. Review of systems:  All other systems reviewed and found to be negative.  Physical Exam:  Blood pressure (!) 134/56, pulse 74, temperature 97.5 F (36.4 C), temperature source Oral, resp. rate 20, height _0  (1.575 m), weight 127 lb 11.2 oz (57.9 kg), SpO2 95 %.  GENERAL:  Elderly woman sitting comfortably on the medical unit in no acute distress. MENTAL STATUS:  Alert and oriented to person, place and time. HEAD:  Short graying hair.  Normocephalic, atraumatic, face symmetric, no Cushingoid features. EYES:  Brown eyes.  Pupils equal round and reactive to light and accomodation.  No conjunctivitis or scleral icterus. ENT:  Oropharynx clear without lesion.  Tongue normal. Mucous membranes moist.  RESPIRATORY:  Decreased breath sounds right chest with dullness to percussion 1/2 way up. CARDIOVASCULAR:  Regular rate and rhythm  without murmur, rub or gallop. ABDOMEN:  Soft, non-tender, with active bowel sounds, and no hepatosplenomegaly.  No masses. SKIN:  No rashes, ulcers or lesions. EXTREMITIES: 2+ pitting edema bilateral below knees.  No skin discoloration or tenderness.  No palpable cords. LYMPH NODES: No palpable cervical, supraclavicular, axillary or inguinal adenopathy  NEUROLOGICAL: Unremarkable. PSYCH:  Appropriate.   Results for orders placed or performed during the hospital encounter of 03/07/17 (from the past 48 hour(s))  Basic metabolic panel     Status: Abnormal   Collection Time: 03/07/17 10:38 PM  Result Value Ref Range   Sodium 127 (L) 135 - 145 mmol/L   Potassium 3.2 (L) 3.5 - 5.1 mmol/L   Chloride 87 (L) 101 - 111 mmol/L   CO2 29 22 - 32 mmol/L   Glucose, Bld 243 (H) 65 -  99 mg/dL   BUN 35 (H) 6 - 20 mg/dL   Creatinine, Ser 1.31 (H) 0.44 - 1.00 mg/dL   Calcium 9.0 8.9 - 10.3 mg/dL   GFR calc non Af Amer 35 (L) >60 mL/min   GFR calc Af Amer 41 (L) >60 mL/min    Comment: (NOTE) The eGFR has been calculated using the CKD EPI equation. This calculation has not been validated in all clinical situations. eGFR's persistently <60 mL/min signify possible Chronic Kidney Disease.    Anion gap 11 5 - 15  Hepatic function panel     Status: Abnormal   Collection Time: 03/07/17 10:38 PM  Result Value Ref Range   Total Protein 7.2 6.5 - 8.1 g/dL   Albumin 3.3 (L) 3.5 - 5.0 g/dL   AST 33 15 - 41 U/L   ALT 23 14 - 54 U/L   Alkaline Phosphatase 106 38 - 126 U/L   Total Bilirubin 0.8 0.3 - 1.2 mg/dL   Bilirubin, Direct 0.2 0.1 - 0.5 mg/dL   Indirect Bilirubin 0.6 0.3 - 0.9 mg/dL  Troponin I     Status: None   Collection Time: 03/07/17 10:38 PM  Result Value Ref Range   Troponin I <0.03 <0.03 ng/mL  Brain natriuretic peptide     Status: Abnormal   Collection Time: 03/07/17 10:38 PM  Result Value Ref Range   B Natriuretic Peptide 182.0 (H) 0.0 - 100.0 pg/mL  CBC with Differential     Status:  Abnormal   Collection Time: 03/07/17 10:38 PM  Result Value Ref Range   WBC 21.8 (H) 3.6 - 11.0 K/uL   RBC 4.29 3.80 - 5.20 MIL/uL   Hemoglobin 11.9 (L) 12.0 - 16.0 g/dL   HCT 36.0 35.0 - 47.0 %   MCV 83.9 80.0 - 100.0 fL   MCH 27.7 26.0 - 34.0 pg   MCHC 33.0 32.0 - 36.0 g/dL   RDW 14.3 11.5 - 14.5 %   Platelets 278 150 - 440 K/uL   Neutrophils Relative % 91 %   Neutro Abs 19.8 (H) 1.4 - 6.5 K/uL   Lymphocytes Relative 3 %   Lymphs Abs 0.7 (L) 1.0 - 3.6 K/uL   Monocytes Relative 5 %   Monocytes Absolute 1.1 (H) 0.2 - 0.9 K/uL   Eosinophils Relative 1 %   Eosinophils Absolute 0.1 0 - 0.7 K/uL   Basophils Relative 0 %   Basophils Absolute 0.0 0 - 0.1 K/uL  Protime-INR     Status: Abnormal   Collection Time: 03/07/17 10:38 PM  Result Value Ref Range   Prothrombin Time 20.0 (H) 11.4 - 15.2 seconds   INR 1.68   TSH     Status: Abnormal   Collection Time: 03/07/17 10:38 PM  Result Value Ref Range   TSH 5.730 (H) 0.350 - 4.500 uIU/mL    Comment: Performed by a 3rd Generation assay with a functional sensitivity of <=0.01 uIU/mL.  Blood gas, venous     Status: Abnormal   Collection Time: 03/08/17 12:13 AM  Result Value Ref Range   FIO2 0.21    pH, Ven 7.42 7.250 - 7.430   pCO2, Ven 49 44.0 - 60.0 mmHg   pO2, Ven 33.0 32.0 - 45.0 mmHg   Bicarbonate 31.8 (H) 20.0 - 28.0 mmol/L   Acid-Base Excess 6.3 (H) 0.0 - 2.0 mmol/L   O2 Saturation 65.0 %   Patient temperature 37.0    Collection site VEIN    Sample type VEIN  Blood culture (routine x 2)     Status: None (Preliminary result)   Collection Time: 03/08/17 12:13 AM  Result Value Ref Range   Specimen Description BLOOD LEFT ANTECUBITAL    Special Requests      BOTTLES DRAWN AEROBIC AND ANAEROBIC Blood Culture adequate volume   Culture NO GROWTH < 12 HOURS    Report Status PENDING   Blood culture (routine x 2)     Status: None (Preliminary result)   Collection Time: 03/08/17 12:33 AM  Result Value Ref Range   Specimen  Description BLOOD LEFT ANTECUBITAL    Special Requests      BOTTLES DRAWN AEROBIC AND ANAEROBIC Blood Culture adequate volume   Culture NO GROWTH < 12 HOURS    Report Status PENDING   MRSA PCR Screening     Status: Abnormal   Collection Time: 03/08/17  2:54 AM  Result Value Ref Range   MRSA by PCR POSITIVE (A) NEGATIVE    Comment:        The GeneXpert MRSA Assay (FDA approved for NASAL specimens only), is one component of a comprehensive MRSA colonization surveillance program. It is not intended to diagnose MRSA infection nor to guide or monitor treatment for MRSA infections. RESULT CALLED TO, READ BACK BY AND VERIFIED WITH: ERWIN MACROHON AT 0426 ON 03/08/17 [place].   Glucose, capillary     Status: Abnormal   Collection Time: 03/08/17  3:33 AM  Result Value Ref Range   Glucose-Capillary 228 (H) 65 - 99 mg/dL  Glucose, capillary     Status: Abnormal   Collection Time: 03/08/17  7:36 AM  Result Value Ref Range   Glucose-Capillary 199 (H) 65 - 99 mg/dL  Glucose, capillary     Status: Abnormal   Collection Time: 03/08/17 11:46 AM  Result Value Ref Range   Glucose-Capillary 229 (H) 65 - 99 mg/dL   Dg Chest 2 View  Result Date: 03/07/2017 CLINICAL DATA:  Shortness of breath with leg swelling EXAM: CHEST  2 VIEW COMPARISON:  01/06/2017 FINDINGS: Post sternotomy changes. Small left-sided pleural effusion. Suspect moderate right-sided pleural effusion. There is consolidation in the right middle lobe and right lower lobe. Stable cardiomegaly. No pneumothorax. IMPRESSION: 1. Small left pleural effusion. Suspect a moderate right pleural effusion. 2. Dense right middle lobe and lung base consolidation 3. Cardiomegaly Electronically Signed   By: Donavan Foil M.D.   On: 03/07/2017 23:13    Assessment:  The patient is a 83 y.o.woman with probable right sided lung cancer and post-obstructive pneumonia.  She has a moderate right sided effusion.  She has hyponatremia possible due to SIADH.  She  is the caregiver for her husband.  Plan:   1.  Oncology:  Discussed initial presentation and current symptoms with patient.  Showed initial CXR images in 11/2016 as well as CT scan in 11/2016.  Compared imaged to current CXR.  Discussed the cause of her symptoms is underlying lung cancer.  Discussed her pleural effusion and shortness of breath.  Discuss that her symptoms will progress.  Discussed her thoughts about therapy.  We discussed consideration of a diagnostic and therapeutic thoracentesis.  She stated that she would need to discuss with her husband.  Multiple questions were asked and answered.  Patient appeared amendable to possible thoracentesis and consideration of treatment.  She now believes that she has a problem.  2.  Pulmonary:  Health care post-obstructive pneumonia.  Patient on vancomycin and Zosyn.  She is considering a procedure for  diagnosis.  3.  Cardiology:  Atrial fibrillation s/p cardioversion.  On Eliquis.  4.  Fluids/Electrolytes/Nutrition:  Poor appetite secondary to underlying malignancy.  Hyponatremia possibly secondary to SIADH.  5.  Code status:   Per chart, limited.   Thank you for allowing me to participate in Donna Lane 's care.  I will follow herclosely with you while hospitalized and after discharge in the outpatient department.   Lequita Asal, MD  03/08/2017, 12:17 PM

## 2017-03-08 NOTE — Progress Notes (Addendum)
Pharmacy Antibiotic Note  Donna Lane is a 83 y.o. female admitted on 03/07/2017 with sepsis.  Pharmacy has been consulted for vancomycin dosing.  Plan: DW 56kg  Vd 39L kei 0.023 hr-1  t1/2 30 hours Vancomycin 750 mg q 36 hours ordered with stacked dosing, Level before 4th dose. Goal trough 15-20.  Zosyn 3.375 grams q 8 hours ordered.  Height: '5\' 1"'$  (154.9 cm) Weight: 123 lb (55.8 kg) IBW/kg (Calculated) : 47.8  Temp (24hrs), Avg:97.9 F (36.6 C), Min:97.7 F (36.5 C), Max:98.1 F (36.7 C)   Recent Labs Lab 03/06/17 1203 03/07/17 2238  WBC  --  21.8*  CREATININE 1.33* 1.31*    Estimated Creatinine Clearance: 22 mL/min (A) (by C-G formula based on SCr of 1.31 mg/dL (H)).    Allergies  Allergen Reactions  . Nsaids Other (See Comments)    Would avoid due to CKD.... Pt states she not allergic    Antimicrobials this admission: vancomycin 3/23 >>  Zosyn x1 3/23  >>   Dose adjustments this admission:   Microbiology results: 3/23 BCx: pending 3/24 MRSA PCR: pending     3/23 CXR: R mid and base consolidation Thank you for allowing pharmacy to be a part of this patient's care.  Mollye Guinta S 03/08/2017 2:59 AM

## 2017-03-08 NOTE — H&P (Signed)
Donna Lane is an 83 y.o. female.   Chief Complaint: Foot swelling HPI: The patient with past medical history of atrial fibrillation, coronary artery disease status post CABG as well as diabetes presents emergency department complaining of lower extremity edema. The patient underwent cardioversion this morning for uncontrolled atrial fibrillation. She is felt very fatigued since the procedure earlier today but complains most about swelling of her feet bilaterally. She denies chest pain or shortness of breath. Chest x-ray showed right lower lobe infiltrate. Review of the patient's chart reveals a known right bronchogenic mass. The patient has seen pulmonology as an outpatient but deferred treatment. Laboratory evaluation today significant for hyponatremia. Due to the aforementioned reasons emergency department staff called the hospitalist service for admission.  Past Medical History:  Diagnosis Date  . Arrhythmia    atrial fibrillation  . Chronic diastolic heart failure ([place])   . CKD (chronic kidney disease), stage III   . COPD (chronic obstructive pulmonary disease) ([place])   . Coronary artery disease 2008   CABG x 4,   . Diabetes mellitus   . Diverticulosis    on colonoscopy 08/12/2006  . Hyperlipidemia   . Hypertension   . Melanoma ([place])    removed by derm   . PVD (peripheral vascular disease) ([place])   . Spinal stenosis     Past Surgical History:  Procedure Laterality Date  . CARDIAC CATHETERIZATION     PTCA x 3  . CATARACT EXTRACTION    . CORONARY ARTERY BYPASS GRAFT  2008   x 4  . HIP FRACTURE SURGERY Left    pins placed w/o hip replacement, hardware removed later  . LUMBAR DISC SURGERY     in her 37s  . tonsillectomy    . VAGINAL DELIVERY     x2    Family History  Problem Relation Age of Onset  . Heart disease Father   . Heart disease Sister   . Diabetes Sister    Social History:  reports that she quit smoking about 9 years ago. Her smoking use included  Cigarettes. She has a 50.00 pack-year smoking history. She has never used smokeless tobacco. She reports that she does not drink alcohol or use drugs.  Allergies:  Allergies  Allergen Reactions  . Nsaids Other (See Comments)    Would avoid due to CKD.... Pt states she not allergic    Medications Prior to Admission  Medication Sig Dispense Refill  . acetaminophen (TYLENOL) 500 MG tablet Take 500 mg by mouth 2 (two) times daily.    Marland Kitchen amiodarone (PACERONE) 200 MG tablet Take 1 tablet (200 mg total) by mouth 2 (two) times daily. 70 tablet 3  . apixaban (ELIQUIS) 2.5 MG TABS tablet Take 1 tablet (2.5 mg total) by mouth 2 (two) times daily. 180 tablet 3  . atorvastatin (LIPITOR) 10 MG tablet Take 1 tablet (10 mg total) by mouth daily at 6 PM. 30 tablet 6  . cyanocobalamin 1000 MCG tablet Take 1,000 mcg by mouth daily.    Marland Kitchen diltiazem (CARDIZEM CD) 240 MG 24 hr capsule Take 1 capsule (240 mg total) by mouth daily. 30 capsule 6  . glipiZIDE (GLUCOTROL XL) 5 MG 24 hr tablet Take 1 tablet (5 mg total) by mouth every morning. 90 tablet 2  . Insulin Detemir (LEVEMIR) 100 UNIT/ML Pen Inject as directed once a day (Patient taking differently: Inject 3-5 Units into the skin at bedtime as needed (for blood sugar reading greater than 150). )    .  nitroGLYCERIN (NITROSTAT) 0.4 MG SL tablet Place 1 tablet (0.4 mg total) under the tongue every 5 (five) minutes as needed for chest pain. 25 tablet 6    Results for orders placed or performed during the hospital encounter of 03/07/17 (from the past 48 hour(s))  Basic metabolic panel     Status: Abnormal   Collection Time: 03/07/17 10:38 PM  Result Value Ref Range   Sodium 127 (L) 135 - 145 mmol/L   Potassium 3.2 (L) 3.5 - 5.1 mmol/L   Chloride 87 (L) 101 - 111 mmol/L   CO2 29 22 - 32 mmol/L   Glucose, Bld 243 (H) 65 - 99 mg/dL   BUN 35 (H) 6 - 20 mg/dL   Creatinine, Ser 1.31 (H) 0.44 - 1.00 mg/dL   Calcium 9.0 8.9 - 10.3 mg/dL   GFR calc non Af Amer 35 (L)  >60 mL/min   GFR calc Af Amer 41 (L) >60 mL/min    Comment: (NOTE) The eGFR has been calculated using the CKD EPI equation. This calculation has not been validated in all clinical situations. eGFR's persistently <60 mL/min signify possible Chronic Kidney Disease.    Anion gap 11 5 - 15  Hepatic function panel     Status: Abnormal   Collection Time: 03/07/17 10:38 PM  Result Value Ref Range   Total Protein 7.2 6.5 - 8.1 g/dL   Albumin 3.3 (L) 3.5 - 5.0 g/dL   AST 33 15 - 41 U/L   ALT 23 14 - 54 U/L   Alkaline Phosphatase 106 38 - 126 U/L   Total Bilirubin 0.8 0.3 - 1.2 mg/dL   Bilirubin, Direct 0.2 0.1 - 0.5 mg/dL   Indirect Bilirubin 0.6 0.3 - 0.9 mg/dL  Troponin I     Status: None   Collection Time: 03/07/17 10:38 PM  Result Value Ref Range   Troponin I <0.03 <0.03 ng/mL  Brain natriuretic peptide     Status: Abnormal   Collection Time: 03/07/17 10:38 PM  Result Value Ref Range   B Natriuretic Peptide 182.0 (H) 0.0 - 100.0 pg/mL  CBC with Differential     Status: Abnormal   Collection Time: 03/07/17 10:38 PM  Result Value Ref Range   WBC 21.8 (H) 3.6 - 11.0 K/uL   RBC 4.29 3.80 - 5.20 MIL/uL   Hemoglobin 11.9 (L) 12.0 - 16.0 g/dL   HCT 36.0 35.0 - 47.0 %   MCV 83.9 80.0 - 100.0 fL   MCH 27.7 26.0 - 34.0 pg   MCHC 33.0 32.0 - 36.0 g/dL   RDW 14.3 11.5 - 14.5 %   Platelets 278 150 - 440 K/uL   Neutrophils Relative % 91 %   Neutro Abs 19.8 (H) 1.4 - 6.5 K/uL   Lymphocytes Relative 3 %   Lymphs Abs 0.7 (L) 1.0 - 3.6 K/uL   Monocytes Relative 5 %   Monocytes Absolute 1.1 (H) 0.2 - 0.9 K/uL   Eosinophils Relative 1 %   Eosinophils Absolute 0.1 0 - 0.7 K/uL   Basophils Relative 0 %   Basophils Absolute 0.0 0 - 0.1 K/uL  Protime-INR     Status: Abnormal   Collection Time: 03/07/17 10:38 PM  Result Value Ref Range   Prothrombin Time 20.0 (H) 11.4 - 15.2 seconds   INR 1.68   TSH     Status: Abnormal   Collection Time: 03/07/17 10:38 PM  Result Value Ref Range   TSH  5.730 (H) 0.350 - 4.500 uIU/mL  Comment: Performed by a 3rd Generation assay with a functional sensitivity of <=0.01 uIU/mL.  Blood gas, venous     Status: Abnormal   Collection Time: 03/08/17 12:13 AM  Result Value Ref Range   FIO2 0.21    pH, Ven 7.42 7.250 - 7.430   pCO2, Ven 49 44.0 - 60.0 mmHg   pO2, Ven 33.0 32.0 - 45.0 mmHg   Bicarbonate 31.8 (H) 20.0 - 28.0 mmol/L   Acid-Base Excess 6.3 (H) 0.0 - 2.0 mmol/L   O2 Saturation 65.0 %   Patient temperature 37.0    Collection site VEIN    Sample type VEIN   Glucose, capillary     Status: Abnormal   Collection Time: 03/08/17  3:33 AM  Result Value Ref Range   Glucose-Capillary 228 (H) 65 - 99 mg/dL   Dg Chest 2 View  Result Date: 03/07/2017 CLINICAL DATA:  Shortness of breath with leg swelling EXAM: CHEST  2 VIEW COMPARISON:  01/06/2017 FINDINGS: Post sternotomy changes. Small left-sided pleural effusion. Suspect moderate right-sided pleural effusion. There is consolidation in the right middle lobe and right lower lobe. Stable cardiomegaly. No pneumothorax. IMPRESSION: 1. Small left pleural effusion. Suspect a moderate right pleural effusion. 2. Dense right middle lobe and lung base consolidation 3. Cardiomegaly Electronically Signed   By: Donavan Foil M.D.   On: 03/07/2017 23:13    Review of Systems  Constitutional: Negative for chills and fever.  HENT: Negative for sore throat and tinnitus.   Eyes: Negative for blurred vision and redness.  Respiratory: Negative for cough and shortness of breath.   Cardiovascular: Positive for leg swelling. Negative for chest pain, palpitations, orthopnea and PND.  Gastrointestinal: Negative for abdominal pain, diarrhea, nausea and vomiting.  Genitourinary: Negative for dysuria, frequency and urgency.  Musculoskeletal: Negative for joint pain and myalgias.  Skin: Negative for rash.       No lesions  Neurological: Negative for speech change, focal weakness and weakness.  Endo/Heme/Allergies:  Does not bruise/bleed easily.       No temperature intolerance  Psychiatric/Behavioral: Negative for depression and suicidal ideas.    Blood pressure (!) 141/50, pulse 74, temperature 97.5 F (36.4 C), temperature source Oral, resp. rate 20, height '5\' 1"'$  (1.549 m), weight 55.8 kg (123 lb), SpO2 94 %. Physical Exam  Vitals reviewed. Constitutional: She is oriented to person, place, and time. She appears well-developed and well-nourished. No distress.  HENT:  Head: Normocephalic and atraumatic.  Mouth/Throat: Oropharynx is clear and moist.  Eyes: Conjunctivae and EOM are normal. Pupils are equal, round, and reactive to light. No scleral icterus.  Neck: Normal range of motion. Neck supple. No JVD present. No tracheal deviation present. No thyromegaly present.  Cardiovascular: Normal rate, regular rhythm and normal heart sounds.  Exam reveals no gallop and no friction rub.   No murmur heard. Respiratory: Effort normal and breath sounds normal.  GI: Soft. Bowel sounds are normal. She exhibits no distension. There is no tenderness.  Genitourinary:  Genitourinary Comments: Deferred  Musculoskeletal: Normal range of motion. She exhibits edema. She exhibits no deformity.  Lymphadenopathy:    She has no cervical adenopathy.  Neurological: She is alert and oriented to person, place, and time. No cranial nerve deficit. She exhibits normal muscle tone.  Skin: Skin is warm and dry. No rash noted. No erythema.  Psychiatric: She has a normal mood and affect. Her behavior is normal. Judgment and thought content normal.     Assessment/Plan This is an 83 year old  female admitted for sepsis. 1. Sepsis: The patient meets criteria via leukocytosis and tachypnea. She is hemodynamically stable. Follow blood cultures for growth and sensitivities. Source is likely pneumonia. Continue broad-spectrum antibiotics 2. Pneumonia: Healthcare associated; post-obstructive. Continue vancomycin and Zosyn. Supplemental  oxygen as needed. Check MRSA PCR in order to discontinue vancomycin if negative. 3. Lung mass: Status post bronchoscopy and pulmonology consultation as an outpatient. The patient is in denial about cancer diagnosis. I have consulted oncology as well as palliative care. 4. Diabetes mellitus type 2: Continue basal insulin as well as sliding scale insulin hospitalized 5. Atrial fibrillation: Status post cardioversion today; rate controlled. Continue Eliquis, diltiazem and amiodarone 6. Hyperlipidemia: Continue statin therapy 7. CKD: Stage III. Avoid nephrotoxic agents 8. DVT prophylaxis: Full dose anticoagulation as above 9. GI prophylaxis: None The patient is a full code. Time spent on admission orders and patient care approximately 45 minutes  Harrie Foreman, MD 03/08/2017, 4:00 AM

## 2017-03-08 NOTE — Progress Notes (Signed)
Donna Lane at [place] NAME: Donna Lane    MR#:  [id number]  DATE OF BIRTH:  1925-05-26  SUBJECTIVE:  CHIEF COMPLAINT:  Patient is out of bed to chair. Shortness of breath is better.  REVIEW OF SYSTEMS:  CONSTITUTIONAL: No fever, fatigue or weakness.  EYES: No blurred or double vision.  EARS, NOSE, AND THROAT: No tinnitus or ear pain.  RESPIRATORY: Reports some cough, denies shortness of breath, wheezing or hemoptysis.  CARDIOVASCULAR: No chest pain, orthopnea, edema.  GASTROINTESTINAL: No nausea, vomiting, diarrhea or abdominal pain.  GENITOURINARY: No dysuria, hematuria.  ENDOCRINE: No polyuria, nocturia,  HEMATOLOGY: No anemia, easy bruising or bleeding SKIN: No rash or lesion. MUSCULOSKELETAL: No joint pain or arthritis.   NEUROLOGIC: No tingling, numbness, weakness.  PSYCHIATRY: No anxiety or depression.   DRUG ALLERGIES:   Allergies  Allergen Reactions  . Nsaids Other (See Comments)    Would avoid due to CKD.... Pt states she not allergic    VITALS:  Blood pressure (!) 134/56, pulse 74, temperature 97.5 F (36.4 C), temperature source Oral, resp. rate 20, height '5\' 2"'$  (1.575 m), weight 57.9 kg (127 lb 11.2 oz), SpO2 95 %.  PHYSICAL EXAMINATION:  GENERAL:  83 y.o.-year-old patient lying in the bed with no acute distress.  EYES: Pupils equal, round, reactive to light and accommodation. No scleral icterus. Extraocular muscles intact.  HEENT: Head atraumatic, normocephalic. Oropharynx and nasopharynx clear.  NECK:  Supple, no jugular venous distention. No thyroid enlargement, no tenderness.  LUNGS:  moderately diminished breath sounds bilaterally, no wheezing, rales,rhonchi or crepitation. No use of accessory muscles of respiration.  CARDIOVASCULAR: S1, S2 normal. No murmurs, rubs, or gallops.  ABDOMEN: Soft, nontender, nondistended. Bowel sounds present. No organomegaly or mass.  EXTREMITIES: No pedal edema,  cyanosis, or clubbing.  NEUROLOGIC: Cranial nerves II through XII are intact. Muscle strength 5/5 in all extremities. Sensation intact. Gait not checked.  PSYCHIATRIC: The patient is alert and oriented x 3.  SKIN: No obvious rash, lesion, or ulcer.    LABORATORY PANEL:   CBC  Recent Labs Lab 03/07/17 2238  WBC 21.8*  HGB 11.9*  HCT 36.0  PLT 278   ------------------------------------------------------------------------------------------------------------------  Chemistries   Recent Labs Lab 03/07/17 2238  NA 127*  K 3.2*  CL 87*  CO2 29  GLUCOSE 243*  BUN 35*  CREATININE 1.31*  CALCIUM 9.0  AST 33  ALT 23  ALKPHOS 106  BILITOT 0.8   ------------------------------------------------------------------------------------------------------------------  Cardiac Enzymes  Recent Labs Lab 03/07/17 2238  TROPONINI <0.03   ------------------------------------------------------------------------------------------------------------------  RADIOLOGY:  Dg Chest 2 View  Result Date: 03/07/2017 CLINICAL DATA:  Shortness of breath with leg swelling EXAM: CHEST  2 VIEW COMPARISON:  01/06/2017 FINDINGS: Post sternotomy changes. Small left-sided pleural effusion. Suspect moderate right-sided pleural effusion. There is consolidation in the right middle lobe and right lower lobe. Stable cardiomegaly. No pneumothorax. IMPRESSION: 1. Small left pleural effusion. Suspect a moderate right pleural effusion. 2. Dense right middle lobe and lung base consolidation 3. Cardiomegaly Electronically Signed   By: Donavan Foil M.D.   On: 03/07/2017 23:13    EKG:   Orders placed or performed during the hospital encounter of 03/07/17  . EKG 12-Lead  . EKG 12-Lead  . ED EKG  . ED EKG  . EKG 12-Lead  . EKG 12-Lead    ASSESSMENT AND PLAN:   This is an 83 year old female admitted for sepsis.   #  Sepsis:  The patient meets criteria via leukocytosis and tachypnea. Secondary to healthcare  associated pneumonia  She is hemodynamically stable.  blood cultures are negative so far. MRSA PCR is negative   Continue broad-spectrum antibiotics  # Pneumonia: Healthcare associated; post-obstructive.  Continue Zosyn. Discontinue vancomycin as MRSA PCR is negative Oxygen as needed and wean off as tolerated  # Lung mass: Status post bronchoscopy and pulmonology consultation as an outpatient, patient was seen by Dr. Mortimer Fries before and agreeable to follow-up with him during the hospital course  Pulmonology consult is placed for  Pending consults oncology as well as palliative care.  # Diabetes mellitus type 2: Continue basal insulin as well as sliding scale insulin hospitalized  # Atrial fibrillation: Status post cardiovesion 03/07/17  Currently rate controlled.  Continue Eliquis, diltiazem and amiodarone   #. Hyperlipidemia: Continue statin therapy  #. CKD: Stage III. Avoid nephrotoxic agents   8. DVT prophylaxis: Full dose anticoagulation eliquis 9. GI prophylaxis: None     All the records are reviewed and case discussed with Care Management/Social Workerr. Management plans discussed with the patient, daughter [name] and they are in agreement.    TOTAL TIME TAKING CARE OF THIS PATIENT: 38 minutes.   POSSIBLE D/C IN 2 DAYS, DEPENDING ON CLINICAL CONDITION.  Note: This dictation was prepared with Dragon dictation along with smaller phrase technology. Any transcriptional errors that result from this process are unintentional.   [name] M.D on 03/08/2017 at 11:49 AM  Between 7am to 6pm - Pager - [phone] After 6pm go to [email or web address] - password [email or web address] [facility]  Office  [phone]  CC: Primary care physician; [name], MD

## 2017-03-09 DIAGNOSIS — J441 Chronic obstructive pulmonary disease with (acute) exacerbation: Secondary | ICD-10-CM

## 2017-03-09 LAB — CBC
HEMATOCRIT: 32.5 % — AB (ref 35.0–47.0)
HEMOGLOBIN: 10.7 g/dL — AB (ref 12.0–16.0)
MCH: 27.9 pg (ref 26.0–34.0)
MCHC: 32.8 g/dL (ref 32.0–36.0)
MCV: 85.1 fL (ref 80.0–100.0)
Platelets: 237 10*3/uL (ref 150–440)
RBC: 3.83 MIL/uL (ref 3.80–5.20)
RDW: 14.6 % — ABNORMAL HIGH (ref 11.5–14.5)
WBC: 25.7 10*3/uL — ABNORMAL HIGH (ref 3.6–11.0)

## 2017-03-09 LAB — BASIC METABOLIC PANEL
Anion gap: 7 (ref 5–15)
BUN: 24 mg/dL — ABNORMAL HIGH (ref 6–20)
CHLORIDE: 95 mmol/L — AB (ref 101–111)
CO2: 27 mmol/L (ref 22–32)
Calcium: 8.3 mg/dL — ABNORMAL LOW (ref 8.9–10.3)
Creatinine, Ser: 1.27 mg/dL — ABNORMAL HIGH (ref 0.44–1.00)
GFR calc non Af Amer: 36 mL/min — ABNORMAL LOW (ref >60)
GFR, EST AFRICAN AMERICAN: 42 mL/min — AB (ref >60)
Glucose, Bld: 152 mg/dL — ABNORMAL HIGH (ref 65–99)
POTASSIUM: 3.6 mmol/L (ref 3.5–5.1)
SODIUM: 129 mmol/L — AB (ref 135–145)

## 2017-03-09 LAB — GLUCOSE, CAPILLARY
Glucose-Capillary: 169 mg/dL — ABNORMAL HIGH (ref 65–99)
Glucose-Capillary: 231 mg/dL — ABNORMAL HIGH (ref 65–99)
Glucose-Capillary: 236 mg/dL — ABNORMAL HIGH (ref 65–99)
Glucose-Capillary: 286 mg/dL — ABNORMAL HIGH (ref 65–99)

## 2017-03-09 MED ORDER — IPRATROPIUM-ALBUTEROL 0.5-2.5 (3) MG/3ML IN SOLN
3.0000 mL | RESPIRATORY_TRACT | Status: DC
  Administered 2017-03-09 – 2017-03-10 (×6): 3 mL via RESPIRATORY_TRACT
  Filled 2017-03-09 (×5): qty 3

## 2017-03-09 MED ORDER — BUDESONIDE 0.5 MG/2ML IN SUSP
0.5000 mg | Freq: Two times a day (BID) | RESPIRATORY_TRACT | Status: DC
  Administered 2017-03-09 – 2017-03-13 (×9): 0.5 mg via RESPIRATORY_TRACT
  Filled 2017-03-09 (×9): qty 2

## 2017-03-09 MED ORDER — ORAL CARE MOUTH RINSE
15.0000 mL | Freq: Two times a day (BID) | OROMUCOSAL | Status: DC
  Administered 2017-03-09 – 2017-03-13 (×8): 15 mL via OROMUCOSAL

## 2017-03-09 MED ORDER — METHYLPREDNISOLONE SODIUM SUCC 40 MG IJ SOLR
40.0000 mg | Freq: Two times a day (BID) | INTRAMUSCULAR | Status: DC
  Administered 2017-03-09 – 2017-03-10 (×3): 40 mg via INTRAVENOUS
  Filled 2017-03-09 (×3): qty 1

## 2017-03-09 NOTE — Progress Notes (Signed)
Donna Lane at [place] NAME: Donna Lane    MR#:  [id number]  DATE OF BIRTH:  02/12/1925  SUBJECTIVE:  CHIEF COMPLAINT:  Shortness of breath is better.  REVIEW OF SYSTEMS:  CONSTITUTIONAL: No fever, fatigue or weakness.  EYES: No blurred or double vision.  EARS, NOSE, AND THROAT: No tinnitus or ear pain.  RESPIRATORY: Reports some cough, denies shortness of breath, wheezing or hemoptysis.  CARDIOVASCULAR: No chest pain, orthopnea, edema.  GASTROINTESTINAL: No nausea, vomiting, diarrhea or abdominal pain.  GENITOURINARY: No dysuria, hematuria.  ENDOCRINE: No polyuria, nocturia,  HEMATOLOGY: No anemia, easy bruising or bleeding SKIN: No rash or lesion. MUSCULOSKELETAL: No joint pain or arthritis.   NEUROLOGIC: No tingling, numbness, weakness.  PSYCHIATRY: No anxiety or depression.   DRUG ALLERGIES:   Allergies  Allergen Reactions  . Nsaids Other (See Comments)    Would avoid due to CKD.... Pt states she not allergic    VITALS:  Blood pressure 137/64, pulse 76, temperature 97.7 F (36.5 C), temperature source Oral, resp. rate 20, height '5\' 2"'$  (1.575 m), weight 59.2 kg (130 lb 9.6 oz), SpO2 98 %.  PHYSICAL EXAMINATION:  GENERAL:  83 y.o.-year-old patient lying in the bed with no acute distress.  EYES: Pupils equal, round, reactive to light and accommodation. No scleral icterus. Extraocular muscles intact.  HEENT: Head atraumatic, normocephalic. Oropharynx and nasopharynx clear.  NECK:  Supple, no jugular venous distention. No thyroid enlargement, no tenderness.  LUNGS:  moderately diminished breath sounds bilaterally, no wheezing, rales,rhonchi or crepitation. No use of accessory muscles of respiration.  CARDIOVASCULAR: S1, S2 normal. No murmurs, rubs, or gallops.  ABDOMEN: Soft, nontender, nondistended. Bowel sounds present. No organomegaly or mass.  EXTREMITIES: No pedal edema, cyanosis, or clubbing.  NEUROLOGIC:  Cranial nerves II through XII are intact. Muscle strength 5/5 in all extremities. Sensation intact. Gait not checked.  PSYCHIATRIC: The patient is alert and oriented x 3.  SKIN: No obvious rash, lesion, or ulcer.    LABORATORY PANEL:   CBC  Recent Labs Lab 03/09/17 0514  WBC 25.7*  HGB 10.7*  HCT 32.5*  PLT 237   ------------------------------------------------------------------------------------------------------------------  Chemistries   Recent Labs Lab 03/07/17 2238 03/09/17 0514  NA 127* 129*  K 3.2* 3.6  CL 87* 95*  CO2 29 27  GLUCOSE 243* 152*  BUN 35* 24*  CREATININE 1.31* 1.27*  CALCIUM 9.0 8.3*  AST 33  --   ALT 23  --   ALKPHOS 106  --   BILITOT 0.8  --    ------------------------------------------------------------------------------------------------------------------  Cardiac Enzymes  Recent Labs Lab 03/07/17 2238  TROPONINI <0.03   ------------------------------------------------------------------------------------------------------------------  RADIOLOGY:  Dg Chest 2 View  Result Date: 03/07/2017 CLINICAL DATA:  Shortness of breath with leg swelling EXAM: CHEST  2 VIEW COMPARISON:  01/06/2017 FINDINGS: Post sternotomy changes. Small left-sided pleural effusion. Suspect moderate right-sided pleural effusion. There is consolidation in the right middle lobe and right lower lobe. Stable cardiomegaly. No pneumothorax. IMPRESSION: 1. Small left pleural effusion. Suspect a moderate right pleural effusion. 2. Dense right middle lobe and lung base consolidation 3. Cardiomegaly Electronically Signed   By: Donavan Foil M.D.   On: 03/07/2017 23:13    EKG:   Orders placed or performed during the hospital encounter of 03/07/17  . EKG 12-Lead  . EKG 12-Lead  . ED EKG  . ED EKG  . EKG 12-Lead  . EKG 12-Lead    ASSESSMENT AND PLAN:  This is an 83 year old female admitted for sepsis.   #  Sepsis: The patient meets criteria via leukocytosis and  tachypnea. Secondary to healthcare associated pneumonia  She is hemodynamically stable.  blood cultures are negative so far. MRSA PCR is negative   Continue broad-spectrum antibiotics and steroids are added for bronchodilation  # Pneumonia: Healthcare associated; post-obstructive.  Continue Zosyn. Discontinue vancomycin as MRSA PCR is negative Oxygen as needed and wean off as tolerated will consider holding eliquis this patient is agreeable for ultrasound-guided thoracocentesis which will be both therapeutic and diagnostic after talking to her family members as recommended  by oncology   # Lung mass: Status post bronchoscopy and pulmonology consultation as an outpatient, patient was seen by Dr. Mortimer Fries before and agreeable to follow-up with him during the hospital course  Patient refused any further aggressive workup for right lung mass Pending consults oncology as well as palliative care. Follow-up with oncology appreciate their recommendations  # Diabetes mellitus type 2: Continue basal insulin as well as sliding scale insulin hospitalized  # Atrial fibrillation: Status post cardiovesion 03/07/17  Currently rate controlled.  Continue Eliquis, diltiazem and amiodarone as patient is refusing any kind of procedures at this time  will consider holding eliquis this patient is agreeable for ultrasound-guided thoracocentesis after talking to her family members   #. Hyperlipidemia: Continue statin therapy  #. CKD: Stage III. Avoid nephrotoxic agents   8. DVT prophylaxis: Full dose anticoagulation eliquis 9. GI prophylaxis: None     All the records are reviewed and case discussed with Care Management/Social Workerr. Management plans discussed with the patient, daughter [name] and they are in agreement.    TOTAL TIME TAKING CARE OF THIS PATIENT: 38 minutes.   POSSIBLE D/C IN 2 DAYS, DEPENDING ON CLINICAL CONDITION.  Note: This dictation was prepared with Dragon dictation along  with smaller phrase technology. Any transcriptional errors that result from this process are unintentional.   [name] M.D on 03/09/2017 at 1:52 PM  Between 7am to 6pm - Pager - [phone] After 6pm go to [email or web address] - password [email or web address] [facility]  Office  [phone]  CC: Primary care physician; [name], MD

## 2017-03-09 NOTE — Progress Notes (Signed)
[facility] Hematology/Oncology Progress Note  Date of admission: 03/07/2017  Hospital day:  03/09/2017  Chief Complaint: Donna Lane is a 83 y.o. female with a post obstructive pneumonia likely secondary to underlying lung cancer who was admitted with sepsis.  Subjective:   She denies any new complaints.   She has fatigue and shortness of breath.  Social History: The patient is alone today.  She has not discussed our conversation yesterday with her family.  She would like to discuss this with her husband and daughter.  Allergies:  Allergies  Allergen Reactions  . Nsaids Other (See Comments)    Would avoid due to CKD.... Pt states she not allergic    Scheduled Medications: . amiodarone  200 mg Oral BID  . apixaban  2.5 mg Oral BID  . atorvastatin  10 mg Oral q1800  . budesonide (PULMICORT) nebulizer solution  0.5 mg Nebulization BID  . Chlorhexidine Gluconate Cloth  6 each Topical Q0600  . diltiazem  240 mg Oral Daily  . docusate sodium  100 mg Oral BID  . insulin aspart  0-9 Units Subcutaneous TID WC  . insulin detemir  2 Units Subcutaneous QHS  . ipratropium-albuterol  3 mL Nebulization Q4H  . methylPREDNISolone (SOLU-MEDROL) injection  40 mg Intravenous Q12H  . mupirocin ointment  1 application Nasal BID  . piperacillin-tazobactam (ZOSYN)  IV  3.375 g Intravenous Q8H  . sodium chloride flush  3 mL Intravenous Q12H  . cyanocobalamin  1,000 mcg Oral Daily    Review of Systems: GENERAL:  Fatigue. No fevers or sweats.  Fluid weight gain. PERFORMANCE STATUS (ECOG):  1 HEENT:  No visual changes, runny nose, sore throat, mouth sores or tenderness. Lungs: Shortness of breath (stable).  Cough.  No hemoptysis. Cardiac:  No chest pain, palpitations, orthopnea, or PND.  Atrial fibrillation s/p cardioversion. GI:  Poor appetite.  No nausea, vomiting, diarrhea, constipation, melena or hematochezia. GU:  No urgency, frequency, dysuria, or  hematuria. Musculoskeletal:  Mid thoracic back pain.  No joint pain.  No muscle tenderness. Extremities: Lower extremity swelling. Skin:  No rashes or skin changes. Neuro:  No headache, numbness or weakness, balance or coordination issues. Endocrine:  Diabetes.  No thyroid issues, hot flashes or night sweats. Psych:  No mood changes, depression or anxiety. Pain:  Mild upper back pain. Review of systems:  All other systems reviewed and found to be negative  Physical Exam: Blood pressure 137/64, pulse 76, temperature 97.7 F (36.5 C), temperature source Oral, resp. rate 20, height _0  (1.575 m), weight 130 lb 9.6 oz (59.2 kg), SpO2 98 %.  GENERAL:  Elderly woman sitting in a recliner next to her bed on the medical unit in no acute distress. MENTAL STATUS:  Alert and oriented to person, place and time. HEAD:  Short graying hair.  Normocephalic, atraumatic, face symmetric, no Cushingoid features. EYES:  Brown eyes.  Pupils equal round and reactive to light and accomodation.  No conjunctivitis or scleral icterus. ENT:  Oropharynx clear without lesion.  Tongue normal. Mucous membranes moist.  RESPIRATORY:  Decreased breath sounds right chest 1/2 way up. CARDIOVASCULAR:  Regular rate and rhythm without murmur, rub or gallop. ABDOMEN:  Soft, non-tender, with active bowel sounds, and no hepatosplenomegaly.  No masses. SKIN:  No rashes, ulcers or lesions. EXTREMITIES: 2+ pitting edema bilateral below knees (tender).  No skin discoloration or tenderness.  No palpable cords. LYMPH NODES: No palpable cervical, supraclavicular, axillary or inguinal adenopathy  NEUROLOGICAL:  Unremarkable. PSYCH:  Appropriate.   Results for orders placed or performed during the hospital encounter of 03/07/17 (from the past 48 hour(s))  Basic metabolic panel     Status: Abnormal   Collection Time: 03/07/17 10:38 PM  Result Value Ref Range   Sodium 127 (L) 135 - 145 mmol/L   Potassium 3.2 (L) 3.5 - 5.1 mmol/L    Chloride 87 (L) 101 - 111 mmol/L   CO2 29 22 - 32 mmol/L   Glucose, Bld 243 (H) 65 - 99 mg/dL   BUN 35 (H) 6 - 20 mg/dL   Creatinine, Ser 1.31 (H) 0.44 - 1.00 mg/dL   Calcium 9.0 8.9 - 10.3 mg/dL   GFR calc non Af Amer 35 (L) >60 mL/min   GFR calc Af Amer 41 (L) >60 mL/min    Comment: (NOTE) The eGFR has been calculated using the CKD EPI equation. This calculation has not been validated in all clinical situations. eGFR's persistently <60 mL/min signify possible Chronic Kidney Disease.    Anion gap 11 5 - 15  Hepatic function panel     Status: Abnormal   Collection Time: 03/07/17 10:38 PM  Result Value Ref Range   Total Protein 7.2 6.5 - 8.1 g/dL   Albumin 3.3 (L) 3.5 - 5.0 g/dL   AST 33 15 - 41 U/L   ALT 23 14 - 54 U/L   Alkaline Phosphatase 106 38 - 126 U/L   Total Bilirubin 0.8 0.3 - 1.2 mg/dL   Bilirubin, Direct 0.2 0.1 - 0.5 mg/dL   Indirect Bilirubin 0.6 0.3 - 0.9 mg/dL  Troponin I     Status: None   Collection Time: 03/07/17 10:38 PM  Result Value Ref Range   Troponin I <0.03 <0.03 ng/mL  Brain natriuretic peptide     Status: Abnormal   Collection Time: 03/07/17 10:38 PM  Result Value Ref Range   B Natriuretic Peptide 182.0 (H) 0.0 - 100.0 pg/mL  CBC with Differential     Status: Abnormal   Collection Time: 03/07/17 10:38 PM  Result Value Ref Range   WBC 21.8 (H) 3.6 - 11.0 K/uL   RBC 4.29 3.80 - 5.20 MIL/uL   Hemoglobin 11.9 (L) 12.0 - 16.0 g/dL   HCT 36.0 35.0 - 47.0 %   MCV 83.9 80.0 - 100.0 fL   MCH 27.7 26.0 - 34.0 pg   MCHC 33.0 32.0 - 36.0 g/dL   RDW 14.3 11.5 - 14.5 %   Platelets 278 150 - 440 K/uL   Neutrophils Relative % 91 %   Neutro Abs 19.8 (H) 1.4 - 6.5 K/uL   Lymphocytes Relative 3 %   Lymphs Abs 0.7 (L) 1.0 - 3.6 K/uL   Monocytes Relative 5 %   Monocytes Absolute 1.1 (H) 0.2 - 0.9 K/uL   Eosinophils Relative 1 %   Eosinophils Absolute 0.1 0 - 0.7 K/uL   Basophils Relative 0 %   Basophils Absolute 0.0 0 - 0.1 K/uL  Protime-INR     Status:  Abnormal   Collection Time: 03/07/17 10:38 PM  Result Value Ref Range   Prothrombin Time 20.0 (H) 11.4 - 15.2 seconds   INR 1.68   TSH     Status: Abnormal   Collection Time: 03/07/17 10:38 PM  Result Value Ref Range   TSH 5.730 (H) 0.350 - 4.500 uIU/mL    Comment: Performed by a 3rd Generation assay with a functional sensitivity of <=0.01 uIU/mL.  Blood gas, venous  Status: Abnormal   Collection Time: 03/08/17 12:13 AM  Result Value Ref Range   FIO2 0.21    pH, Ven 7.42 7.250 - 7.430   pCO2, Ven 49 44.0 - 60.0 mmHg   pO2, Ven 33.0 32.0 - 45.0 mmHg   Bicarbonate 31.8 (H) 20.0 - 28.0 mmol/L   Acid-Base Excess 6.3 (H) 0.0 - 2.0 mmol/L   O2 Saturation 65.0 %   Patient temperature 37.0    Collection site VEIN    Sample type VEIN   Blood culture (routine x 2)     Status: None (Preliminary result)   Collection Time: 03/08/17 12:13 AM  Result Value Ref Range   Specimen Description BLOOD LEFT ANTECUBITAL    Special Requests      BOTTLES DRAWN AEROBIC AND ANAEROBIC Blood Culture adequate volume   Culture NO GROWTH 1 DAY    Report Status PENDING   Blood culture (routine x 2)     Status: None (Preliminary result)   Collection Time: 03/08/17 12:33 AM  Result Value Ref Range   Specimen Description BLOOD LEFT ANTECUBITAL    Special Requests      BOTTLES DRAWN AEROBIC AND ANAEROBIC Blood Culture adequate volume   Culture NO GROWTH 1 DAY    Report Status PENDING   MRSA PCR Screening     Status: Abnormal   Collection Time: 03/08/17  2:54 AM  Result Value Ref Range   MRSA by PCR POSITIVE (A) NEGATIVE    Comment:        The GeneXpert MRSA Assay (FDA approved for NASAL specimens only), is one component of a comprehensive MRSA colonization surveillance program. It is not intended to diagnose MRSA infection nor to guide or monitor treatment for MRSA infections. RESULT CALLED TO, READ BACK BY AND VERIFIED WITH: ERWIN MACROHON AT 0426 ON 03/08/17 [place].   Glucose, capillary      Status: Abnormal   Collection Time: 03/08/17  3:33 AM  Result Value Ref Range   Glucose-Capillary 228 (H) 65 - 99 mg/dL  Glucose, capillary     Status: Abnormal   Collection Time: 03/08/17  7:36 AM  Result Value Ref Range   Glucose-Capillary 199 (H) 65 - 99 mg/dL  Glucose, capillary     Status: Abnormal   Collection Time: 03/08/17 11:46 AM  Result Value Ref Range   Glucose-Capillary 229 (H) 65 - 99 mg/dL  Glucose, capillary     Status: Abnormal   Collection Time: 03/08/17  5:18 PM  Result Value Ref Range   Glucose-Capillary 176 (H) 65 - 99 mg/dL  CBC     Status: Abnormal   Collection Time: 03/09/17  5:14 AM  Result Value Ref Range   WBC 25.7 (H) 3.6 - 11.0 K/uL   RBC 3.83 3.80 - 5.20 MIL/uL   Hemoglobin 10.7 (L) 12.0 - 16.0 g/dL   HCT 32.5 (L) 35.0 - 47.0 %   MCV 85.1 80.0 - 100.0 fL   MCH 27.9 26.0 - 34.0 pg   MCHC 32.8 32.0 - 36.0 g/dL   RDW 14.6 (H) 11.5 - 14.5 %   Platelets 237 150 - 440 K/uL  Basic metabolic panel     Status: Abnormal   Collection Time: 03/09/17  5:14 AM  Result Value Ref Range   Sodium 129 (L) 135 - 145 mmol/L   Potassium 3.6 3.5 - 5.1 mmol/L   Chloride 95 (L) 101 - 111 mmol/L   CO2 27 22 - 32 mmol/L   Glucose, Bld 152 (H)  65 - 99 mg/dL   BUN 24 (H) 6 - 20 mg/dL   Creatinine, Ser 1.27 (H) 0.44 - 1.00 mg/dL   Calcium 8.3 (L) 8.9 - 10.3 mg/dL   GFR calc non Af Amer 36 (L) >60 mL/min   GFR calc Af Amer 42 (L) >60 mL/min    Comment: (NOTE) The eGFR has been calculated using the CKD EPI equation. This calculation has not been validated in all clinical situations. eGFR's persistently <60 mL/min signify possible Chronic Kidney Disease.    Anion gap 7 5 - 15  Glucose, capillary     Status: Abnormal   Collection Time: 03/09/17  8:15 AM  Result Value Ref Range   Glucose-Capillary 169 (H) 65 - 99 mg/dL  Glucose, capillary     Status: Abnormal   Collection Time: 03/09/17 11:48 AM  Result Value Ref Range   Glucose-Capillary 231 (H) 65 - 99 mg/dL   Dg  Chest 2 View  Result Date: 03/07/2017 CLINICAL DATA:  Shortness of breath with leg swelling EXAM: CHEST  2 VIEW COMPARISON:  01/06/2017 FINDINGS: Post sternotomy changes. Small left-sided pleural effusion. Suspect moderate right-sided pleural effusion. There is consolidation in the right middle lobe and right lower lobe. Stable cardiomegaly. No pneumothorax. IMPRESSION: 1. Small left pleural effusion. Suspect a moderate right pleural effusion. 2. Dense right middle lobe and lung base consolidation 3. Cardiomegaly Electronically Signed   By: Donavan Foil M.D.   On: 03/07/2017 23:13    Assessment:  Donna Lane is a 83 y.o. female with probable right sided lung cancer and post-obstructive pneumonia.  She has a moderate right sided effusion.  She has hyponatremia possible due to SIADH.  She is the caregiver for her husband.  Plan:   1.  Oncology:   I reviewed our conversation yesterday.  Patient has not discussed this with her family.  We again discussed a potential diagnostic and therapeutic thoracentesis secondary to her shortness of breath.  Pleural fluid cytology may provide the diagnosis.  I discussed that although this may temporarily relieve her shortness of breath, that the fluid will likely return.  If that occurs and she dose not want treatment, we discussed Pleur-x catheter versus pleurodesis.  If a thoracentesis is planned, we will need to hold her Eliquis.  Patient remains undecided.  We also discuss palliative care and Hospice.  She does not want to consider Hospice given her experience with them and other family members/friends in the past.  I discussed the benefits of Hospice.  Multiple questions were asked and answered.    2.  Pulmonary:  Health care post-obstructive pneumonia.  Patient on vancomycin and Zosyn.  She is undecided about a procedure for diagnosis.  Appreciate pulmonary consult.  3.  Cardiology:  Atrial fibrillation s/p cardioversion.  On Eliquis.  4.   Fluids/Electrolytes/Nutrition:  Poor appetite secondary to underlying malignancy.  Hyponatremia possibly secondary to SIADH.  5.  Code status:   Per chart, limited.   Lequita Asal, MD  03/09/2017, 1:52 PM

## 2017-03-09 NOTE — Progress Notes (Signed)
[place] [facility] Consultation      Date: 03/09/2017,   MRN# [id number] Donna Lane 04-16-1925 Code Status:  Code Status History    Date Active Date Inactive Code Status Order ID Comments User Context   01/07/2017  7:26 PM 01/09/2017  5:42 PM Partial Code [id number]  Vaughan Basta, MD Inpatient   01/06/2017  3:21 PM 01/07/2017  7:26 PM Full Code [id number]  Epifanio Lesches, MD ED   11/28/2016  7:40 PM 11/29/2016  3:44 PM Full Code [id number]  Vaughan Basta, MD Inpatient    Questions for Most Recent Historical Code Status (Order [id number])    Question Answer Comment   In the event of cardiac or respiratory ARREST: Initiate Code Blue, Call Rapid Response Yes    In the event of cardiac or respiratory ARREST: Perform CPR Yes    In the event of cardiac or respiratory ARREST: Perform Intubation/Mechanical Ventilation No    In the event of cardiac or respiratory ARREST: Use NIPPV/BiPAp only if indicated Yes    In the event of cardiac or respiratory ARREST: Administer ACLS medications if indicated Yes    In the event of cardiac or respiratory ARREST: Perform Defibrillation or Cardioversion if indicated Yes      Hosp day:'@LENGTHOFSTAYDAYS'$ @ Referring MD: '@ATDPROV'$ @     PCP:      AdmissionWeight: 123 lb (55.8 kg)                 CurrentWeight: 130 lb 9.6 oz (59.2 kg) Donna Lane is a 83 y.o. old female seen in consultation for lung mass at the request of Dr. Posey Pronto.     CHIEF COMPLAINT:  h/o lung mass based on CT chest Copd exacerbation  ho  HISTORY OF PRESENT ILLNESS  83 y.o.femalewith a known history of Coronary artery disease, severe 3 vessel disease with bypass surgery, history of peripheral vascular disease, bilateral renal artery stenosis Also h/o COPD  Was admitted previously several months ago  for Afib with RVR CT chest obtained shows possible RML endobronchial lesion, possible lung mass   She is diagnosed to have possible lung  mass by CT chest in December but did not pursue any treatment or workup for that because she did not want to hear the word cancer. She still continues to refuse work up at this time   I have has long discussion with patient regarding her CT chest. She states: "If I have lung mass, I Don't want to get any procedures at this time"   She is alert and awake, follows commands,+SOB +wheezing on oxygen therapy  Patient has chronic SOB and DOE, she does NOT want to pursue any more diagnostic interventions at this time patient has dx with COPD   Will treat for post obstructive pneumonia    PAST MEDICAL HISTORY   Past Medical History:  Diagnosis Date  . Arrhythmia    atrial fibrillation  . Chronic diastolic heart failure ([place])   . CKD (chronic kidney disease), stage III   . COPD (chronic obstructive pulmonary disease) ([place])   . Coronary artery disease 2008   CABG x 4,   . Diabetes mellitus   . Diverticulosis    on colonoscopy 08/12/2006  . Hyperlipidemia   . Hypertension   . Melanoma ([place])    removed by derm   . PVD (peripheral vascular disease) ([place])   . Spinal stenosis      SURGICAL HISTORY   Past Surgical History:  Procedure Laterality Date  . CARDIAC  CATHETERIZATION     PTCA x 3  . CATARACT EXTRACTION    . CORONARY ARTERY BYPASS GRAFT  2008   x 4  . HIP FRACTURE SURGERY Left    pins placed w/o hip replacement, hardware removed later  . LUMBAR DISC SURGERY     in her 37s  . tonsillectomy    . VAGINAL DELIVERY     x2     FAMILY HISTORY   Family History  Problem Relation Age of Onset  . Heart disease Father   . Heart disease Sister   . Diabetes Sister      SOCIAL HISTORY   Social History  Substance Use Topics  . Smoking status: Former Smoker    Packs/day: 1.00    Years: 50.00    Types: Cigarettes    Quit date: 03/20/2007  . Smokeless tobacco: Never Used  . Alcohol use No     MEDICATIONS    Home Medication:    Current  Medication:  Current Facility-Administered Medications:  .  0.9 %  sodium chloride infusion, , Intravenous, Continuous, Harrie Foreman, MD, Last Rate: 100 mL/hr at 03/09/17 0242 .  acetaminophen (TYLENOL) tablet 650 mg, 650 mg, Oral, Q6H PRN **OR** acetaminophen (TYLENOL) suppository 650 mg, 650 mg, Rectal, Q6H PRN, Harrie Foreman, MD .  amiodarone (PACERONE) tablet 200 mg, 200 mg, Oral, BID, Harrie Foreman, MD, 200 mg at 03/09/17 0949 .  apixaban (ELIQUIS) tablet 2.5 mg, 2.5 mg, Oral, BID, Harrie Foreman, MD, 2.5 mg at 03/09/17 0855 .  atorvastatin (LIPITOR) tablet 10 mg, 10 mg, Oral, q1800, Harrie Foreman, MD, 10 mg at 03/08/17 1802 .  Chlorhexidine Gluconate Cloth 2 % PADS 6 each, 6 each, Topical, Q0600, Harrie Foreman, MD, 6 each at 03/09/17 [phone] .  diltiazem (CARDIZEM CD) 24 hr capsule 240 mg, 240 mg, Oral, Daily, Harrie Foreman, MD, 240 mg at 03/09/17 0949 .  docusate sodium (COLACE) capsule 100 mg, 100 mg, Oral, BID, Harrie Foreman, MD, 100 mg at 03/09/17 0949 .  insulin aspart (novoLOG) injection 0-9 Units, 0-9 Units, Subcutaneous, TID WC, Harrie Foreman, MD, 2 Units at 03/09/17 [phone] .  insulin detemir (LEVEMIR) injection 2 Units, 2 Units, Subcutaneous, QHS, Harrie Foreman, MD, 2 Units at 03/08/17 2122 .  mupirocin ointment (BACTROBAN) 2 % 1 application, 1 application, Nasal, BID, Harrie Foreman, MD, 1 application at 77/82/42 [phone] .  nitroGLYCERIN (NITROSTAT) SL tablet 0.4 mg, 0.4 mg, Sublingual, Q5 min PRN, Harrie Foreman, MD .  ondansetron [facility]) tablet 4 mg, 4 mg, Oral, Q6H PRN **OR** ondansetron (ZOFRAN) injection 4 mg, 4 mg, Intravenous, Q6H PRN, Harrie Foreman, MD, 4 mg at 03/09/17 0242 .  piperacillin-tazobactam (ZOSYN) IVPB 3.375 g, 3.375 g, Intravenous, Q8H, Lance Coon, MD, 3.375 g at 03/09/17 0539 .  sodium chloride flush (NS) 0.9 % injection 3 mL, 3 mL, Intravenous, Q12H, Harrie Foreman, MD, 3 mL at 03/09/17 0949 .  vitamin B-12  (CYANOCOBALAMIN) tablet 1,000 mcg, 1,000 mcg, Oral, Daily, Harrie Foreman, MD, 1,000 mcg at 03/09/17 [phone] .  zolpidem (AMBIEN) tablet 5 mg, 5 mg, Oral, QHS PRN, Baxter Hire, MD, 5 mg at 03/08/17 2122    ALLERGIES   Nsaids     REVIEW OF SYSTEMS   Review of Systems  Constitutional: Positive for malaise/fatigue. Negative for chills, diaphoresis, fever and weight loss.  HENT: Negative for congestion and hearing loss.   Eyes: Negative for blurred vision and  double vision.  Respiratory: Positive for cough, shortness of breath and wheezing. Negative for hemoptysis and sputum production.   Cardiovascular: Negative for chest pain, palpitations and orthopnea.  Gastrointestinal: Negative for abdominal pain, heartburn, nausea and vomiting.  Genitourinary: Negative for dysuria and urgency.  Musculoskeletal: Negative for back pain, myalgias and neck pain.  Skin: Negative for rash.  Neurological: Negative for weakness.  All other systems reviewed and are negative.    VS: BP (!) 122/56 (BP Location: Left Arm)   Pulse 75   Temp 97.7 F (36.5 C)   Resp (!) 24   Ht '5\' 2"'$  (1.575 m)   Wt 130 lb 9.6 oz (59.2 kg)   SpO2 90%   BMI 23.89 kg/m      PHYSICAL EXAM  Physical Exam  Constitutional: She is oriented to person, place, and time. No distress.  HENT:  Head: Normocephalic and atraumatic.  Mouth/Throat: No oropharyngeal exudate.  Eyes: EOM are normal. Pupils are equal, round, and reactive to light. No scleral icterus.  Neck: Normal range of motion. Neck supple.  Cardiovascular: Normal rate, regular rhythm and normal heart sounds.   No murmur heard. Pulmonary/Chest: No stridor. She is in respiratory distress. She has wheezes.  Abdominal: Soft. Bowel sounds are normal.  Musculoskeletal: Normal range of motion. She exhibits no edema.  Neurological: She is alert and oriented to person, place, and time. No cranial nerve deficit.  Skin: Skin is warm. She is not diaphoretic.   Psychiatric: She has a normal mood and affect.       ASSESSMENT/PLAN   83 yo white female seen today for COPD exacerbation from acute post obstructive pneumonia  and Lung mass  1.start steroids 2.continue Iv abx 3.start aggressive BD therapy 4.probable RT Lung mass-patient  does NOT want any further work up at this time She would like to talk with family first  This can be done as outpatient  Will follow up with patient  Corrin Parker, M.D.  [facility]  Medical Director [place] Director [facility] Cardio-Pulmonary Department

## 2017-03-10 ENCOUNTER — Encounter: Payer: Self-pay | Admitting: Cardiovascular Disease

## 2017-03-10 ENCOUNTER — Inpatient Hospital Stay: Payer: Medicare Other

## 2017-03-10 ENCOUNTER — Telehealth: Payer: Self-pay | Admitting: Cardiovascular Disease

## 2017-03-10 DIAGNOSIS — J9811 Atelectasis: Secondary | ICD-10-CM

## 2017-03-10 DIAGNOSIS — R062 Wheezing: Secondary | ICD-10-CM

## 2017-03-10 DIAGNOSIS — J189 Pneumonia, unspecified organism: Secondary | ICD-10-CM

## 2017-03-10 DIAGNOSIS — R918 Other nonspecific abnormal finding of lung field: Secondary | ICD-10-CM

## 2017-03-10 DIAGNOSIS — F5101 Primary insomnia: Secondary | ICD-10-CM

## 2017-03-10 DIAGNOSIS — Z515 Encounter for palliative care: Secondary | ICD-10-CM

## 2017-03-10 DIAGNOSIS — J90 Pleural effusion, not elsewhere classified: Secondary | ICD-10-CM

## 2017-03-10 LAB — BASIC METABOLIC PANEL
ANION GAP: 11 (ref 5–15)
BUN: 26 mg/dL — AB (ref 6–20)
CO2: 23 mmol/L (ref 22–32)
Calcium: 8.7 mg/dL — ABNORMAL LOW (ref 8.9–10.3)
Chloride: 95 mmol/L — ABNORMAL LOW (ref 101–111)
Creatinine, Ser: 1.25 mg/dL — ABNORMAL HIGH (ref 0.44–1.00)
GFR calc Af Amer: 43 mL/min — ABNORMAL LOW (ref >60)
GFR, EST NON AFRICAN AMERICAN: 37 mL/min — AB (ref >60)
GLUCOSE: 247 mg/dL — AB (ref 65–99)
POTASSIUM: 4.3 mmol/L (ref 3.5–5.1)
Sodium: 129 mmol/L — ABNORMAL LOW (ref 135–145)

## 2017-03-10 LAB — GLUCOSE, CAPILLARY
Glucose-Capillary: 170 mg/dL — ABNORMAL HIGH (ref 65–99)
Glucose-Capillary: 280 mg/dL — ABNORMAL HIGH (ref 65–99)
Glucose-Capillary: 243 mg/dL — ABNORMAL HIGH (ref 65–99)
Glucose-Capillary: 205 mg/dL — ABNORMAL HIGH (ref 65–99)
Glucose-Capillary: 246 mg/dL — ABNORMAL HIGH (ref 65–99)

## 2017-03-10 MED ORDER — TRAZODONE HCL 50 MG PO TABS
25.0000 mg | ORAL_TABLET | Freq: Every day | ORAL | Status: DC
  Administered 2017-03-10 – 2017-03-12 (×3): 25 mg via ORAL
  Filled 2017-03-10 (×3): qty 1

## 2017-03-10 MED ORDER — AMOXICILLIN-POT CLAVULANATE 875-125 MG PO TABS
1.0000 | ORAL_TABLET | Freq: Two times a day (BID) | ORAL | Status: DC
  Administered 2017-03-10 – 2017-03-12 (×5): 1 via ORAL
  Filled 2017-03-10 (×6): qty 1

## 2017-03-10 MED ORDER — DIPHENHYDRAMINE HCL 25 MG PO CAPS
25.0000 mg | ORAL_CAPSULE | Freq: Every evening | ORAL | Status: DC | PRN
  Administered 2017-03-10: 25 mg via ORAL
  Filled 2017-03-10: qty 1

## 2017-03-10 MED ORDER — IPRATROPIUM-ALBUTEROL 0.5-2.5 (3) MG/3ML IN SOLN
3.0000 mL | Freq: Four times a day (QID) | RESPIRATORY_TRACT | Status: DC
  Administered 2017-03-10 – 2017-03-13 (×11): 3 mL via RESPIRATORY_TRACT
  Filled 2017-03-10 (×14): qty 3

## 2017-03-10 NOTE — Anesthesia Postprocedure Evaluation (Signed)
Anesthesia Post Note  Patient: Donna Lane  Procedure(s) Performed: Procedure(s) (LRB): Cardioversion (N/A)  Patient location during evaluation: PACU Anesthesia Type: General Level of consciousness: awake and alert Pain management: pain level controlled Vital Signs Assessment: post-procedure vital signs reviewed and stable Respiratory status: spontaneous breathing, nonlabored ventilation, respiratory function stable and patient connected to nasal cannula oxygen Cardiovascular status: blood pressure returned to baseline and stable Postop Assessment: no signs of nausea or vomiting Anesthetic complications: no     Last Vitals:  Vitals:   03/07/17 0800 03/07/17 0815  BP: 132/71 129/63  Pulse: 72 75  Resp: 16 16  Temp:      Last Pain:  Vitals:   03/07/17 0633  TempSrc: Oral                 Molli Barrows

## 2017-03-10 NOTE — Telephone Encounter (Signed)
Patient wasnts dr. [name] to know she is admitted to hospital. She wants him to come see her.

## 2017-03-10 NOTE — Progress Notes (Signed)
Patient requesting something for sleep and not wanting the Ambien. Pt reports Ambien makes her feel "drunk". Notified MD orders written for benadryl and Ambien discontinued. Pt agreed to orders and has taken benadryl. No other issues or concerns at this time will continue to monitor.

## 2017-03-10 NOTE — Progress Notes (Addendum)
No new complaints No distress up in chair with [place] O2  Vitals:   03/10/17 0448 03/10/17 0500 03/10/17 0936 03/10/17 1335  BP:   (!) 128/58 130/62  Pulse:   78 76  Resp:    18  Temp:    98.7 F (37.1 C)  TempSrc:      SpO2: 93%  100% 100%  Weight:  130 lb 12.8 oz (59.3 kg)    Height:       Elderly, NAD No JVD Dull to perc Bronchial breath sounds in the right lower lobe area. No wheezes Irregularly irregular, no murmurs noted Abdomen is soft with bowel sounds present Symmetric bilateral ankle edema  BMP Latest Ref Rng & Units 03/10/2017 03/09/2017 03/07/2017  Glucose 65 - 99 mg/dL 247(H) 152(H) 243(H)  BUN 6 - 20 mg/dL 26(H) 24(H) 35(H)  Creatinine 0.44 - 1.00 mg/dL 1.25(H) 1.27(H) 1.31(H)  BUN/Creat Ratio 11 - 26 - - -  Sodium 135 - 145 mmol/L 129(L) 129(L) 127(L)  Potassium 3.5 - 5.1 mmol/L 4.3 3.6 3.2(L)  Chloride 101 - 111 mmol/L 95(L) 95(L) 87(L)  CO2 22 - 32 mmol/L '23 27 29  '$ Calcium 8.9 - 10.3 mg/dL 8.7(L) 8.3(L) 9.0   CBC Latest Ref Rng & Units 03/09/2017 03/07/2017 01/20/2017  WBC 3.6 - 11.0 K/uL 25.7(H) 21.8(H) 16.3(H)  Hemoglobin 12.0 - 16.0 g/dL 10.7(L) 11.9(L) 12.0  Hematocrit 35.0 - 47.0 % 32.5(L) 36.0 36.9  Platelets 150 - 440 K/uL 237 278 316.0   CXR from 03/23 reviewed - suspect effusion + atelectasis  IMPRESSION: R hilar mass (noted on films from Dec)  Now with increasing RLL/RML atelectasis and effusion.  Possible post obstructive PNA but I'm most inclined to think this is effusion and atx This is almost certainly malignancy  Pt has refused diagnostic eval previously There is no wheezing on current exam  PLAN/REC: Discontinue Zosyn Augmentin twice a day for 4 days Discontinue systemic steroids Continue nebulized steroids and bronchodilators-dosing schedule adjusted If a diagnostic evaluation is desired, thoracentesis would be the correct first step  If pleural fluid cytology is negative, bronchoscopy could be considered  However, it is reasonable to  forego diagnostic evaluation given the patient's advanced age Would discharge home on oxygen and nebulized bronchodilators  I will have her follow-up with Dr. Mortimer Fries in 4-6 weeks PCCM will sign off. Please call if we can be of further assistance  Merton Border, MD PCCM service Mobile [phone] Pager [phone] 03/10/2017

## 2017-03-10 NOTE — Progress Notes (Signed)
[place] [facility] [place] NAME: Donna Lane    MR#:  [id number]  DATE OF BIRTH:  [birth year]  SUBJECTIVE:  CHIEF COMPLAINT:  Shortness of breath is better.Left hand is swollen and weeping fluid. Patient's daughter is out of town.Niece  REVIEW OF SYSTEMS:  CONSTITUTIONAL: No fever, fatigue or weakness.  EYES: No blurred or double vision.  EARS, NOSE, AND THROAT: No tinnitus or ear pain.  RESPIRATORY: Reports some cough, denies shortness of breath, wheezing or hemoptysis.  CARDIOVASCULAR: No chest pain, orthopnea, edema.  GASTROINTESTINAL: No nausea, vomiting, diarrhea or abdominal pain.  GENITOURINARY: No dysuria, hematuria.  ENDOCRINE: No polyuria, nocturia,  HEMATOLOGY: No anemia, easy bruising or bleeding SKIN: No rash or lesion. MUSCULOSKELETAL: No joint pain or arthritis.   NEUROLOGIC: No tingling, numbness, weakness.  PSYCHIATRY: No anxiety or depression.   DRUG ALLERGIES:   Allergies  Allergen Reactions  . Nsaids Other (See Comments)    Would avoid due to CKD.... Pt states she not allergic    VITALS:  Blood pressure 130/62, pulse 76, temperature 98.7 F (37.1 C), resp. rate 18, height '5\' 2"'$  (1.575 m), weight 59.3 kg (130 lb 12.8 oz), SpO2 100 %.  PHYSICAL EXAMINATION:  GENERAL:  83 y.o.-year-old patient lying in the bed with no acute distress.  EYES: Pupils equal, round, reactive to light and accommodation. No scleral icterus. Extraocular muscles intact.  HEENT: Head atraumatic, normocephalic. Oropharynx and nasopharynx clear.  NECK:  Supple, no jugular venous distention. No thyroid enlargement, no tenderness.  LUNGS:  moderately diminished breath sounds bilaterallyMore on the right side than left no wheezing, rales,rhonchi or crepitation. No use of accessory muscles of respiration.  CARDIOVASCULAR: S1, S2 normal. No murmurs, rubs, or gallops.  ABDOMEN: Soft, nontender, nondistended. Bowel sounds present. No  organomegaly or mass.  EXTREMITIES: No pedal edema, cyanosis, or clubbing. Left upper extremity is edematous with the erythematous papular rash and weeping fluid NEUROLOGIC: Cranial nerves II through XII are intact. Muscle strength 5/5 in all extremities. Sensation intact. Gait not checked.  PSYCHIATRIC: The patient is alert and oriented x 3.  SKIN: No obvious rash, lesion, or ulcer.    LABORATORY PANEL:   CBC  Recent Labs Lab 03/09/17 0514  WBC 25.7*  HGB 10.7*  HCT 32.5*  PLT 237   ------------------------------------------------------------------------------------------------------------------  Chemistries   Recent Labs Lab 03/07/17 2238  03/10/17 0831  NA 127*  < > 129*  K 3.2*  < > 4.3  CL 87*  < > 95*  CO2 29  < > 23  GLUCOSE 243*  < > 247*  BUN 35*  < > 26*  CREATININE 1.31*  < > 1.25*  CALCIUM 9.0  < > 8.7*  AST 33  --   --   ALT 23  --   --   ALKPHOS 106  --   --   BILITOT 0.8  --   --   < > = values in this interval not displayed. ------------------------------------------------------------------------------------------------------------------  Cardiac Enzymes  Recent Labs Lab 03/07/17 2238  TROPONINI <0.03   ------------------------------------------------------------------------------------------------------------------  RADIOLOGY:  No results found.  EKG:   Orders placed or performed during the hospital encounter of 03/07/17  . EKG 12-Lead  . EKG 12-Lead  . ED EKG  . ED EKG  . EKG 12-Lead  . EKG 12-Lead    ASSESSMENT AND PLAN:   This is an 83 year old female admitted for sepsis.   #  Sepsis: The patient meets criteria  via leukocytosis and tachypnea. Secondary to healthcare associated pneumonia, Clinically improving  blood cultures are negative so far. MRSA PCR is negative   Continue broad-spectrum antibiotics and steroids are added for bronchodilation  # Pneumonia: Healthcare associated; post-obstructive.  Continue Zosyn.  Discontinue vancomycin as MRSA PCR is negative Oxygen as needed and wean off as tolerated  patient is agreeable for ultrasound-guided thoracocentesis which will be both therapeutic and diagnostic discussed with the interventional radiologist Dr. Vernard Gambles, patient doesn't need to stop Eliquis  and doesn't need to be nothing by mouth, thoracocentesis labs ordered  # Lung mass: Status post bronchoscopy and pulmonology consultation as an outpatient, patient was seen by Dr. Mortimer Fries before and agreeable to follow-up with him during the hospital course  Patient refused any further aggressive workup for right lung mass but agreeable with right thoracentesis, ordered and discussed with interventional radiology Pending consults oncology as well as palliative care. Follow-up with oncology appreciate their recommendations  #Left upper extremity edema with weeping fluid Ultrasound is negative for DVT Encouraged patient to keep her left upper extremity elevated and compression dressing  #Hyponatremia could be from SIADH from the lung mass Sodium at 129  # Diabetes mellitus type 2: Continue basal insulin as well as sliding scale insulin hospitalized  # Atrial fibrillation: Status post cardiovesion 03/07/17  Currently rate controlled.  Continue Eliquis, diltiazem and amiodarone as patient is refusing any kind of procedures at this time     #. Hyperlipidemia: Continue statin therapy  #. CKD: Stage III. Avoid nephrotoxic agents   8. DVT prophylaxis: Full dose anticoagulation eliquis 9. GI prophylaxis: None  Follow-up with palliative care   All the records are reviewed and case discussed with Care Management/Social Workerr. Management plans discussed with the patient, niece and they are in agreement. Patient's daughter is out of town Will return tomorrow night    [place] THIS PATIENT: 38 minutes.   POSSIBLE D/C IN 2 DAYS, DEPENDING ON CLINICAL CONDITION.  Note: This dictation  was prepared with Dragon dictation along with smaller phrase technology. Any transcriptional errors that result from this process are unintentional.   [name] M.D on 03/10/2017 at 2:18 PM  Between 7am to 6pm - Pager - [phone] After 6pm go to [email or web address] - password [email or web address] [facility]  Office  [phone]  CC: Primary care physician; [name], MD

## 2017-03-10 NOTE — Progress Notes (Signed)
Dr Margaretmary Eddy requested RN to order wound consult for L arm swelling and weeping. Order in place.

## 2017-03-10 NOTE — Progress Notes (Signed)
Inpatient Diabetes Program Recommendations  AACE/ADA: New Consensus Statement on Inpatient Glycemic Control (2015)  Target Ranges:  Prepandial:   less than 140 mg/dL      Peak postprandial:   less than 180 mg/dL (1-2 hours)      Critically ill patients:  140 - 180 mg/dL   Review of Glycemic Control  Diabetes history: DM Outpatient Diabetes medications: Levemir 3-5 units qhs for glucose >150, Glipizide 5 mg Daily Current orders for Inpatient glycemic control: Levemir 2 units qhs, Novolog 0-9 units tid  Inpatient Diabetes Program Recommendations:   Glucose in the 280's. While patient is on IV Solumedrol 40 mg Q12 hours, consider placing patient on max home dose of Levemir 5 units qhs.  Consider obtaining an A1c level to assess glucose control over the past 2-3 months. Last A1c 7.5% on 01/07/17  Thanks,  Tama Headings RN, MSN, [facility] Inpatient Diabetes Coordinator Team Pager [phone] (8a-5p)

## 2017-03-10 NOTE — Consult Note (Addendum)
Consultation Note Date: 03/10/2017   Patient Name: Donna Lane  DOB: May 27, 1925  MRN: [id number]  Age / Sex: 83 y.o., female  PCP: Donna Ghent, MD Referring Physician: [name], MD  Reason for Consultation: Establishing goals of care and Psychosocial/spiritual support  HPI/Patient Profile: 83 y.o. female  with past medical history of CAD/CABG, COPD, Afib, CKD, DM, PVD, and lung mass (found in 11/2016) who was admitted on 03/07/2017 with sepsis secondary to post obstructive pneumonia and persistent atrial fibrillation.  She underwent cardioversion without complication on 0/86.   Currently she is in the hospital feeling much better but is having difficulty with swelling in her LUE.  PMT was consulted for Donna Lane with regard to thoracentesis, code status and on-going care.  Clinical Assessment and Goals of Care: I met at bedside with the patient and her sister as well as a family friend.  The patient is hard of hearing, and the conversation is difficult with the other people in the room. She is upset due to lack of response in a bathroom situation. She is sitting up in a chair eating tomato soup.  She complains about the swelling and the weeping in her left arm.  She also complains of trouble sleeping.    The swelling in her left arm is a new problem that started since admission.  Insomnia is chronic - she takes trazodone at home per her friend.  Donna Lane lives at home with her husband.  He is in poor health and has a feeding tube.   I spoke in the hallway with Dr. Margaretmary Lane.  It is clear the patient will need her daughter (who is HCPOA) in order to help make any decisions about procedures or on-going care.  I will attempt to reach out to her dtr.  ADDENDUM I spoke with Donna Lane (Dtr, HCPOA) on the phone.  We discussed the thoracentesis procedure.  Donna Lane will be in the hospital tomorrow afternoon and will  speak with her about having thoracentesis done.  She is ok with her mother being off of Eliquis only if her mother agrees to the procedure and Dr. Saundra Shelling (cardiologist) says its ok.   Primary Decision Maker:  HCPOA, Donna Lane.    SUMMARY OF RECOMMENDATIONS    Will need family meeting involving [employer].  She does not return until Wednesday.  I am attempting to reach her by phone. Will start trazodone 25 mg QHS. Primary team aware of LUE swelling/weeping  Code Status/Advance Care Planning:  Limited code (DNI)  Symptom Management:   Trazodone for sleep  Palliative Prophylaxis:   Frequent Pain Assessment  Prognosis:   < 6 months pending [place] conversation.  Discharge Planning: To Be Determined      Primary Diagnoses: Present on Admission: . Sepsis ([place])   I have reviewed the medical record, interviewed the patient and family, and examined the patient. The following aspects are pertinent.  Past Medical History:  Diagnosis Date  . Arrhythmia    atrial fibrillation  . Chronic diastolic heart  failure ([place])   . CKD (chronic kidney disease), stage III   . COPD (chronic obstructive pulmonary disease) ([place])   . Coronary artery disease 2008   CABG x 4,   . Diabetes mellitus   . Diverticulosis    on colonoscopy 08/12/2006  . Hyperlipidemia   . Hypertension   . Melanoma ([place])    removed by derm   . PVD (peripheral vascular disease) ([place])   . Spinal stenosis    Social History   Social History  . Marital status: Married    Spouse name: N/A  . Number of children: N/A  . Years of education: N/A   Social History Main Topics  . Smoking status: Former Smoker    Packs/day: 1.00    Years: 50.00    Types: Cigarettes    Quit date: 03/20/2007  . Smokeless tobacco: Never Used  . Alcohol use No  . Drug use: No  . Sexual activity: No   Other Topics Concern  . None   Social History Narrative   [place]    Married 1953   2 kids, local.    Retired.     Family History  Problem Relation Age of Onset  . Heart disease Father   . Heart disease Sister   . Diabetes Sister    Scheduled Meds: . amiodarone  200 mg Oral BID  . amoxicillin-clavulanate  1 tablet Oral Q12H  . apixaban  2.5 mg Oral BID  . atorvastatin  10 mg Oral q1800  . budesonide (PULMICORT) nebulizer solution  0.5 mg Nebulization BID  . Chlorhexidine Gluconate Cloth  6 each Topical Q0600  . diltiazem  240 mg Oral Daily  . docusate sodium  100 mg Oral BID  . insulin aspart  0-9 Units Subcutaneous TID WC  . insulin detemir  2 Units Subcutaneous QHS  . ipratropium-albuterol  3 mL Nebulization QID  . mouth rinse  15 mL Mouth Rinse BID  . mupirocin ointment  1 application Nasal BID  . sodium chloride flush  3 mL Intravenous Q12H  . traZODone  25 mg Oral QHS  . cyanocobalamin  1,000 mcg Oral Daily   Continuous Infusions: PRN Meds:.acetaminophen **OR** acetaminophen, diphenhydrAMINE, nitroGLYCERIN, ondansetron **OR** ondansetron (ZOFRAN) IV Allergies  Allergen Reactions  . Nsaids Other (See Comments)    Would avoid due to CKD.... Pt states she not allergic   Review of Systems no constipation or anorexia.  Breathing improved, no chest pain.  Patient complains of insomnia and left arm swelling.  Physical Exam  Well developed female A&O HOH, sitting up eating soup, NAD Left arm diffusely swollen and weeping.   Vital Signs: BP (!) 128/58 (BP Location: Left Arm)   Pulse 78   Temp 97.5 F (36.4 C) (Oral)   Resp 20   Ht _0  (1.575 m)   Wt 59.3 kg (130 lb 12.8 oz)   SpO2 100%   BMI 23.92 kg/m  Pain Assessment: 0-10   Pain Score: 0-No pain   SpO2: SpO2: 100 % O2 Device:SpO2: 100 % O2 Flow Rate: .O2 Flow Rate (L/min): 3 L/min  IO: Intake/output summary:  Intake/Output Summary (Last 24 hours) at 03/10/17 1304 Last data filed at 03/10/17 0547  Gross per 24 hour  Intake             2890 ml  Output                0 ml  Net  2890 ml    LBM: Last  BM Date: 03/08/17 Baseline Weight: Weight: 55.8 kg (123 lb) Most recent weight: Weight: 59.3 kg (130 lb 12.8 oz)     Palliative Assessment/Data:   Flowsheet Rows     Most Recent Value  Intake Tab  Referral Department  Hospitalist  Unit at Time of Referral  Cardiac/Telemetry Unit  Palliative Care Primary Diagnosis  Pulmonary  Date Notified  03/09/17  Palliative Care Type  New Palliative care  Reason for referral  Clarify Goals of Care  Date of Admission  03/07/17  Date first seen by Palliative Care  03/10/17  # of days Palliative referral response time  1 Day(s)  # of days IP prior to Palliative referral  2  Clinical Assessment  Palliative Performance Scale Score  40%  Psychosocial & Spiritual Assessment  Palliative Care Outcomes  Patient/Family meeting held?  Yes  Who was at the meeting?  patient and sister  Palliative Care Outcomes  Improved non-pain symptom therapy, Clarified goals of care      Time In: 12:15 Time Out: 1:21 Time Total: 66 min. Greater than 50%  of this time was spent counseling and coordinating care related to the above assessment and plan.  Signed by: Imogene Burn, PA-C Palliative Medicine Pager: [phone]  Please contact Palliative Medicine Team phone at [phone] for questions and concerns.  For individual provider: See Shea Evans

## 2017-03-11 ENCOUNTER — Inpatient Hospital Stay: Payer: Medicare Other

## 2017-03-11 DIAGNOSIS — J90 Pleural effusion, not elsewhere classified: Secondary | ICD-10-CM

## 2017-03-11 LAB — GLUCOSE, PLEURAL OR PERITONEAL FLUID: GLUCOSE FL: 255 mg/dL

## 2017-03-11 LAB — GLUCOSE, CAPILLARY
Glucose-Capillary: 208 mg/dL — ABNORMAL HIGH (ref 65–99)
Glucose-Capillary: 241 mg/dL — ABNORMAL HIGH (ref 65–99)
Glucose-Capillary: 308 mg/dL — ABNORMAL HIGH (ref 65–99)
Glucose-Capillary: 286 mg/dL — ABNORMAL HIGH (ref 65–99)

## 2017-03-11 LAB — BODY FLUID CELL COUNT WITH DIFFERENTIAL
EOS FL: 0 %
Lymphs, Fluid: 41 %
Monocyte-Macrophage-Serous Fluid: 3 %
NEUTROPHIL FLUID: 56 %
Other Cells, Fluid: 0 %
WBC FLUID: 627 uL

## 2017-03-11 LAB — AMYLASE, PLEURAL OR PERITONEAL FLUID: AMYLASE FL: 75 U/L

## 2017-03-11 LAB — LACTATE DEHYDROGENASE, PLEURAL OR PERITONEAL FLUID: LD, Fluid: 88 U/L — ABNORMAL HIGH (ref 3–23)

## 2017-03-11 LAB — PROTEIN, PLEURAL OR PERITONEAL FLUID: Total protein, fluid: <3 g/dL

## 2017-03-11 MED ORDER — OXYCODONE HCL 5 MG PO TABS: 2.5000 mg | ORAL_TABLET | ORAL | Status: DC | PRN

## 2017-03-11 MED ORDER — INSULIN ASPART 100 UNIT/ML ~~LOC~~ SOLN
0.0000 [IU] | Freq: Three times a day (TID) | SUBCUTANEOUS | Status: DC
  Administered 2017-03-11: 18:00:00 7 [IU] via SUBCUTANEOUS
  Administered 2017-03-12: 5 [IU] via SUBCUTANEOUS
  Administered 2017-03-12: 2 [IU] via SUBCUTANEOUS
  Administered 2017-03-13: 1 [IU] via SUBCUTANEOUS
  Administered 2017-03-13: 2 [IU] via SUBCUTANEOUS
  Filled 2017-03-11: qty 7
  Filled 2017-03-11: qty 2
  Filled 2017-03-11: qty 5
  Filled 2017-03-11: qty 2
  Filled 2017-03-11: qty 1

## 2017-03-11 MED ORDER — INSULIN ASPART 100 UNIT/ML ~~LOC~~ SOLN
0.0000 [IU] | Freq: Every day | SUBCUTANEOUS | Status: DC
  Administered 2017-03-11: 21:00:00 3 [IU] via SUBCUTANEOUS
  Filled 2017-03-11: qty 3

## 2017-03-11 MED ORDER — INSULIN DETEMIR 100 UNIT/ML ~~LOC~~ SOLN
4.0000 [IU] | Freq: Every day | SUBCUTANEOUS | Status: DC
  Administered 2017-03-11 – 2017-03-12 (×2): 4 [IU] via SUBCUTANEOUS
  Filled 2017-03-11 (×3): qty 0.04

## 2017-03-11 NOTE — Progress Notes (Signed)
Inpatient Diabetes Program Recommendations  AACE/ADA: New Consensus Statement on Inpatient Glycemic Control (2015)  Target Ranges:  Prepandial:   less than 140 mg/dL      Peak postprandial:   less than 180 mg/dL (1-2 hours)      Critically ill patients:  140 - 180 mg/dL   Results for Donna Lane, Donna Lane (MRN [id number]) as of 03/11/2017 08:46  Ref. Range 03/10/2017 07:58 03/10/2017 11:37 03/10/2017 17:02 03/10/2017 19:48 03/11/2017 07:54  Glucose-Capillary Latest Ref Range: 65 - 99 mg/dL 280 (H) 243 (H) 205 (H) 246 (H) 208 (H)   Review of Glycemic Control  Diabetes history: DM2 Outpatient Diabetes medications: Levemir 3-5 units QHS PRN, Glipizide XL 5 mg QAM Current orders for Inpatient glycemic control: Levemir 2 units QHS, Novolog 0-9 units TID with meals  Inpatient Diabetes Program Recommendations: Insulin - Basal: Fasting glucose 208 mg/dl today. Note steroids have been discontinued and last received on 03/10/17. Please consider increasing Levemir to 4 units QHS. Correction (SSI): Please consider ordering Novolog 0-5 units QHS for bedtime correction scale.  Thanks, Barnie Alderman, RN, MSN, CDE Diabetes Coordinator Inpatient Diabetes Program [phone] (Team Pager from 8am to 5pm)

## 2017-03-11 NOTE — Progress Notes (Addendum)
Daily Progress Note   Patient Name: Donna Lane       Date: 03/11/2017 DOB: February 26, 1925  Age: 83 y.o. MRN#: [id number] Attending Physician: [name], MD Primary Care Physician: [name], MD Admit Date: 03/07/2017  Reason for Consultation/Follow-up: Establishing goals of care  Subjective: Donna Lane is concerned about ER (her husband) he is at home alone.  She is his primary care taker.  He has a PEG tube in place and she normally feeds him.  She wants very much to be at home with him.  She tells me - I'm not breathing well and I can't walk.  She does not want to go to rehab - but is worried she has no choice.  She does not want to go to [facility] where ER went recently.  She wants to be closer to home (She is from [place], but would prefer [place] to [place]).  Last December when she was diagnosed with her lung mass she did not want it worked up - but now she says I'm willing to take Radiation (no chemo) so they will have to have a firm diagnosis.  The idea of having a biopsy is stressful to her.  Complaining of back pain.  Recommended / ordered Kpad.    Also complaining of not being able to urinate/itching/burning.   Will meet with dtr later today.  Assessment: 89 yof female.  SOB now weak and unable to walk.  High stress level due to illness and being the primary care taker for her husband.    Patient Profile/HPI: 83 y.o. female  with past medical history of CAD/CABG, COPD, Afib, CKD, DM, PVD, and lung mass (found in 11/2016) who was admitted on 03/07/2017 with sepsis secondary to post obstructive pneumonia and persistent atrial fibrillation.  CT scan shows a 4.6 cm mass in right middle lobe that is obstructing the airway and a 3.6 mm node in LUL.  She  underwent cardioversion without complication on 9/76.   Currently she is in the hospital feeling much better but is having difficulty with swelling in her LUE.  PMT was consulted for [place] with regard to thoracentesis, code status and on-going care.   Length of Stay: 3  Current Medications: Scheduled Meds:  . amiodarone  200 mg Oral BID  . amoxicillin-clavulanate  1 tablet  Oral Q12H  . apixaban  2.5 mg Oral BID  . atorvastatin  10 mg Oral q1800  . budesonide (PULMICORT) nebulizer solution  0.5 mg Nebulization BID  . Chlorhexidine Gluconate Cloth  6 each Topical Q0600  . diltiazem  240 mg Oral Daily  . docusate sodium  100 mg Oral BID  . insulin aspart  0-9 Units Subcutaneous TID WC  . insulin detemir  2 Units Subcutaneous QHS  . ipratropium-albuterol  3 mL Nebulization QID  . mouth rinse  15 mL Mouth Rinse BID  . mupirocin ointment  1 application Nasal BID  . sodium chloride flush  3 mL Intravenous Q12H  . traZODone  25 mg Oral QHS  . cyanocobalamin  1,000 mcg Oral Daily    Continuous Infusions:   PRN Meds: acetaminophen **OR** acetaminophen, diphenhydrAMINE, nitroGLYCERIN, ondansetron **OR** ondansetron (ZOFRAN) IV  Physical Exam        Elderly female, appears weak, on nasal canula.  A&O, cooperative.  Vital Signs: BP (!) 132/50 (BP Location: Right Arm)   Pulse 93   Temp 97.7 F (36.5 C) (Oral)   Resp 20   Ht '5\' 2"'$  (1.575 m)   Wt 63.5 kg (140 lb)   SpO2 98%   BMI 25.61 kg/m  SpO2: SpO2: 98 % O2 Device: O2 Device: Nasal Cannula O2 Flow Rate: O2 Flow Rate (L/min): 2 L/min  Intake/output summary:  Intake/Output Summary (Last 24 hours) at 03/11/17 0951 Last data filed at 03/10/17 1930  Gross per 24 hour  Intake              240 ml  Output                0 ml  Net              240 ml   LBM: Last BM Date: 03/08/17 Baseline Weight: Weight: 55.8 kg (123 lb) Most recent weight: Weight: 63.5 kg (140 lb)       Palliative Assessment/Data:    Flowsheet Rows      Most Recent Value  Intake Tab  Referral Department  Hospitalist  Unit at Time of Referral  Cardiac/Telemetry Unit  Palliative Care Primary Diagnosis  Pulmonary  Date Notified  03/09/17  Palliative Care Type  New Palliative care  Reason for referral  Clarify Goals of Care  Date of Admission  03/07/17  Date first seen by Palliative Care  03/10/17  # of days Palliative referral response time  1 Day(s)  # of days IP prior to Palliative referral  2  Clinical Assessment  Palliative Performance Scale Score  40%  Psychosocial & Spiritual Assessment  Palliative Care Outcomes  Patient/Family meeting held?  Yes  Who was at the meeting?  patient and sister  Palliative Care Outcomes  Improved non-pain symptom therapy, Clarified goals of care      Patient Active Problem List   Diagnosis Date Noted  . Healthcare-associated pneumonia   . Palliative care encounter   . Primary insomnia   . Sepsis ([place]) 03/08/2017  . Edema 02/23/2017  . Chronic diastolic heart failure ([place]) 01/19/2017  . Atrial fibrillation with rapid ventricular response ([place]) 01/06/2017  . Acute respiratory distress 01/06/2017  . COPD (chronic obstructive pulmonary disease) ([place]) 01/06/2017  . Hypokalemia 01/06/2017  . Elevated troponin 11/29/2016  . Acute renal insufficiency 11/29/2016  . Hyponatremia 11/29/2016  . Lung mass 11/29/2016  . Atelectasis of right lung 11/29/2016  . Left eye complaint 07/14/2016  . Caregiver stress 07/14/2016  .  Olecranon bursitis of right elbow 06/19/2016  . Myalgia and myositis 05/20/2016  . Chronic kidney disease, stage 3 01/09/2016  . Advance care planning 01/05/2016  . Spinal stenosis of lumbar region 01/05/2016  . Diabetes mellitus type 2, uncontrolled, with complications ([place]) 79/39/0300  . Hyperlipidemia 03/14/2010  . Essential hypertension 03/14/2010  . CORONARY ATHEROSLERO AUTOL VEIN BYPASS GRAFT 03/14/2010  . Peripheral arterial disease ([place]) 03/14/2010  . Chest pain  03/14/2010    Palliative Care Plan    Recommendations/Plan:  Thoracentesis today.  Patient willing to take radiation therapy in order to continue to care for her husband.  PMT will plan to meet with family when dtr is available to discuss code status and further [place].  Goals of Care and Additional Recommendations:  Limitations on Scope of Treatment: No Chemotherapy   Treat the treatable.  Kpad to back for pain.   Oxycodone 2.5 for pain or shortness of breath.  Patient will need PT  Will check urinalysis.  Code Status:  Limited code  Prognosis:   Unable to determine pending goals of care.   Discharge Planning:  To Be Determined.  Likely SNF with palliative.  Care plan was discussed with bedside RN  Thank you for allowing the Palliative Medicine Team to assist in the care of this patient.  Total time spent:  35 min.     Greater than 50%  of this time was spent counseling and coordinating care related to the above assessment and plan.  Imogene Burn, PA-C Palliative Medicine  Please contact Palliative MedicineTeam phone at [phone] for questions and concerns between 7 am - 7 pm.   Please see AMION for individual provider pager numbers.

## 2017-03-11 NOTE — Consult Note (Signed)
[place] Nurse wound consult note Reason for Consult: Upper extremity weeping. Third spacing noted. Recent thoracentesis  Wound type: weeping with edema, no open wounds: Dressing procedure/placement/frequency: unfortunately no good way to manage weeping.  Will try some xeroform, kerlix and ACE wraps.  If weeping continues and significant enough to saturate ACE wraps will DC ACE wraps and only use kerlix and xeroform.  Discussed POC with patient and bedside nurse.  Re consult if needed, will not follow at this time. Thanks  Charlena Haub [employer], RN,CWOCN, CNS [phone])

## 2017-03-11 NOTE — Procedures (Signed)
Under US guidance, right thoracentesis was performed. CXR to follow.

## 2017-03-11 NOTE — Care Management Important Message (Signed)
Important Message  Patient Details  Name: Donna Lane MRN: [id number] Date of Birth: [birth year]   Medicare Important Message Given:  Yes    Shelbie Ammons, RN 03/11/2017, 8:32 AM

## 2017-03-11 NOTE — Progress Notes (Addendum)
Medications patient is a [facility] at [place] NAME: Donna Lane    MR#:  [id number]  DATE OF BIRTH:  1925/03/14  SUBJECTIVE:  CHIEF COMPLAINT:  Shortness of breath is better.Had a thoracentesis today tolerated procedure Patient's daughter is out of town.Niece  REVIEW OF SYSTEMS:  CONSTITUTIONAL: No fever, fatigue or weakness.  EYES: No blurred or double vision.  EARS, NOSE, AND THROAT: No tinnitus or ear pain.  RESPIRATORY: Reports some cough, denies shortness of breath, wheezing or hemoptysis.  CARDIOVASCULAR: No chest pain, orthopnea, edema.  GASTROINTESTINAL: No nausea, vomiting, diarrhea or abdominal pain.  GENITOURINARY: No dysuria, hematuria.  ENDOCRINE: No polyuria, nocturia,  HEMATOLOGY: No anemia, easy bruising or bleeding SKIN: No rash or lesion. MUSCULOSKELETAL: No joint pain or arthritis.   NEUROLOGIC: No tingling, numbness, weakness.  PSYCHIATRY: No anxiety or depression.   DRUG ALLERGIES:   Allergies  Allergen Reactions  . Nsaids Other (See Comments)    Would avoid due to CKD.... Pt states she not allergic    VITALS:  Blood pressure (!) 119/53, pulse 80, temperature 97.5 F (36.4 C), temperature source Oral, resp. rate 20, height '5\' 2"'$  (1.575 m), weight 63.5 kg (140 lb), SpO2 100 %.  PHYSICAL EXAMINATION:  GENERAL:  83 y.o.-year-old patient lying in the bed with no acute distress.  EYES: Pupils equal, round, reactive to light and accommodation. No scleral icterus. Extraocular muscles intact.  HEENT: Head atraumatic, normocephalic. Oropharynx and nasopharynx clear.  NECK:  Supple, no jugular venous distention. No thyroid enlargement, no tenderness.  LUNGS:  moderately diminished breath sounds bilaterallyMore on the right side than left no wheezing, rales,rhonchi or crepitation. No use of accessory muscles of respiration.  CARDIOVASCULAR: S1, S2 normal. No murmurs, rubs, or gallops.  ABDOMEN: Soft,  nontender, nondistended. Bowel sounds present. No organomegaly or mass.  EXTREMITIES: No pedal edema, cyanosis, or clubbing. Left upper extremity is edematous with the erythematous papular rash and weeping fluid NEUROLOGIC: Cranial nerves II through XII are intact. Muscle strength 5/5 in all extremities. Sensation intact. Gait not checked.  PSYCHIATRIC: The patient is alert and oriented x 3.  SKIN: No obvious rash, lesion, or ulcer.    LABORATORY PANEL:   CBC  Recent Labs Lab 03/09/17 0514  WBC 25.7*  HGB 10.7*  HCT 32.5*  PLT 237   ------------------------------------------------------------------------------------------------------------------  Chemistries   Recent Labs Lab 03/07/17 2238  03/10/17 0831  NA 127*  < > 129*  K 3.2*  < > 4.3  CL 87*  < > 95*  CO2 29  < > 23  GLUCOSE 243*  < > 247*  BUN 35*  < > 26*  CREATININE 1.31*  < > 1.25*  CALCIUM 9.0  < > 8.7*  AST 33  --   --   ALT 23  --   --   ALKPHOS 106  --   --   BILITOT 0.8  --   --   < > = values in this interval not displayed. ------------------------------------------------------------------------------------------------------------------  Cardiac Enzymes  Recent Labs Lab 03/07/17 2238  TROPONINI <0.03   ------------------------------------------------------------------------------------------------------------------  RADIOLOGY:  Dg Chest 1 View  Result Date: 03/11/2017 CLINICAL DATA:  Status post right thoracentesis. EXAM: CHEST 1 VIEW COMPARISON:  Radiographs of March 07, 2017. FINDINGS: Stable cardiomediastinal silhouette. Atherosclerosis of thoracic aorta is noted. Status post coronary artery bypass graft. No pneumothorax is noted. Mild left pleural effusion is noted. Right pleural effusion is noted which is improved compared  to prior exam. Bony thorax is unremarkable. IMPRESSION: No pneumothorax status post right-sided thoracentesis. Electronically Signed   By: Marijo Conception, M.D.   On:  03/11/2017 11:40   US Venous Img Upper Uni Left  Result Date: 03/10/2017 CLINICAL DATA:  Left hand swelling EXAM: LEFT UPPER EXTREMITY VENOUS DOPPLER ULTRASOUND TECHNIQUE: Gray-scale sonography with graded compression, as well as color Doppler and duplex ultrasound were performed to evaluate the upper extremity deep venous system from the level of the subclavian vein and including the jugular, axillary, basilic, radial, ulnar and upper cephalic vein. Spectral Doppler was utilized to evaluate flow at rest and with distal augmentation maneuvers. COMPARISON:  None. FINDINGS: Contralateral Subclavian Vein: Respiratory phasicity is normal and symmetric with the symptomatic side. No evidence of thrombus. Normal compressibility. Internal Jugular Vein: No evidence of thrombus. Normal compressibility, respiratory phasicity and response to augmentation. Subclavian Vein: No evidence of thrombus. Normal compressibility, respiratory phasicity and response to augmentation. Axillary Vein: No evidence of thrombus. Normal compressibility, respiratory phasicity and response to augmentation. Cephalic Vein: No evidence of thrombus. Normal compressibility, respiratory phasicity and response to augmentation. Basilic Vein: No evidence of thrombus. Normal compressibility, respiratory phasicity and response to augmentation. Brachial Veins: No evidence of thrombus. Normal compressibility, respiratory phasicity and response to augmentation. Radial Veins: No evidence of thrombus. Normal compressibility, respiratory phasicity and response to augmentation. Ulnar Veins: No evidence of thrombus. Normal compressibility, respiratory phasicity and response to augmentation. Venous Reflux:  None visualized. Other Findings:  None visualized. IMPRESSION: No evidence of deep venous thrombosis. Electronically Signed   By: Julian Hy M.D.   On: 03/10/2017 16:33   US Thoracentesis Asp Pleural Space W/img Guide  Result Date:  03/11/2017 INDICATION: Right pleural effusion. EXAM: ULTRASOUND GUIDED right THORACENTESIS MEDICATIONS: None. COMPLICATIONS: None immediate. PROCEDURE: An ultrasound guided thoracentesis was thoroughly discussed with the patient and questions answered. The benefits, risks, alternatives and complications were also discussed. The patient understands and wishes to proceed with the procedure. Written consent was obtained. Ultrasound was performed to localize and mark an adequate pocket of fluid in the right chest. The area was then prepped and draped in the normal sterile fashion. 1% Lidocaine was used for local anesthesia. Under ultrasound guidance a 8 Fr Safe-T-Centesis catheter was introduced. Thoracentesis was performed. The catheter was removed and a dressing applied. FINDINGS: A total of approximately 1500 mL of serous fluid was removed. Samples were sent to the laboratory as requested by the clinical team. IMPRESSION: Successful ultrasound guided right thoracentesis yielding 1.5 L of pleural fluid. Electronically Signed   By: Marijo Conception, M.D.   On: 03/11/2017 11:41    EKG:   Orders placed or performed during the hospital encounter of 03/07/17  . EKG 12-Lead  . EKG 12-Lead  . ED EKG  . ED EKG  . EKG 12-Lead  . EKG 12-Lead    ASSESSMENT AND PLAN:   This is an 83 year old female admitted for sepsis.   #  Sepsis: The patient meets criteria via leukocytosis and tachypnea. Secondary to healthcare associated pneumonia, Clinically improving  blood cultures are negative so far. MRSA PCR is negative   Continue broad-spectrum antibiotics and steroids are added for bronchodilation  # Pneumonia: Healthcare associated; post-obstructive.  Continue Zosyn. Discontinue vancomycin as MRSA PCR is negative Oxygen as needed and wean off as tolerated  patient is agreeable for ultrasound-guided thoracocentesis which will be both therapeutic and diagnostic discussed with the interventional radiologist Dr.  Vernard Gambles, patient doesn't need to stop  Eliquis  and doesn't need to be nothing by mouth, thoracocentesis labs ordered  # Lung mass: Status post bronchoscopy and pulmonology consultation as an outpatient, patient was seen by Dr. Mortimer Fries before and agreeable to follow-up with him during the hospital course  Patient refused any further aggressive workup for right lung mass but agreeable with right thoracentesis,  Patient had right-sided thoracentesis today tolerated procedure well , fluid was sent for thoracentesis labs including culture  Follow-up with palliative care. Follow-up with oncology appreciate their recommendations  #Left upper extremity edema with weeping fluid-third spacing Ultrasound is negative for DVT Clinically better todayand it is noticed Encouraged patient to keep her left upper extremity elevated and compression dressing Follow-up with wound care  #Hyponatremia could be from SIADH from the lung mass Sodium at 129  # Diabetes mellitus type 2: Continue basal insulin as well as sliding scale insulin hospitalized  # Atrial fibrillation: Status post cardiovesion 03/07/17  Currently rate controlled.  Continue Eliquis, diltiazem and amiodarone as patient is refusing any kind of procedures at this time     #. Hyperlipidemia: Continue statin therapy  #. CKD: Stage III. Avoid nephrotoxic agents   8. DVT prophylaxis: Full dose anticoagulation eliquis 9. GI prophylaxis: None  Follow-up with palliative care-,PT  Disposition skilled nursing facility with palliative care All the records are reviewed and case discussed with Care Management/Social Workerr. Management plans discussed with the patient, niece and they are in agreement. Patient's daughter is out of town Will return tomorrow night    [place] THIS PATIENT: 38 minutes.   POSSIBLE D/C IN 2 DAYS, DEPENDING ON CLINICAL CONDITION.  Note: This dictation was prepared with Dragon dictation along with  smaller phrase technology. Any transcriptional errors that result from this process are unintentional.   [name] M.D on 03/11/2017 at 4:25 PM  Between 7am to 6pm - Pager - [phone] After 6pm go to [email or web address] - password [email or web address] [facility]  Office  [phone]  CC: Primary care physician; [name], MD

## 2017-03-11 NOTE — Care Management (Signed)
Admitted to this facility with the diagnosis of sepsis. Lives with husband, Percell Miller, [phone]). Last seen Dr. Damita Dunnings 01/20/17. Daughter is [name] [phone]).  Prescriptions are filled at [facility]. No home health. No skilled facility. States she has been using oxygen at night for about a week. But started using oxygen all the time. Doesn't remember name of agency. Last fall was 6-7 months ago. Decreased appetite, but has gained 9 pounds of fluid. Self feed, self dress, but needs help getting in and out of shower, does drive, if needed. Daughter or son will transport. Started on Eliquis 2.5 mg January 25 th 2018.  Possible Thoracentesis today (lung mass). Shelbie Ammons RN MSN CCM Care Management

## 2017-03-12 DIAGNOSIS — R0602 Shortness of breath: Secondary | ICD-10-CM

## 2017-03-12 LAB — BASIC METABOLIC PANEL
ANION GAP: 8 (ref 5–15)
BUN: 32 mg/dL — AB (ref 6–20)
CALCIUM: 9 mg/dL (ref 8.9–10.3)
CO2: 25 mmol/L (ref 22–32)
Chloride: 94 mmol/L — ABNORMAL LOW (ref 101–111)
Creatinine, Ser: 1.45 mg/dL — ABNORMAL HIGH (ref 0.44–1.00)
GFR calc Af Amer: 36 mL/min — ABNORMAL LOW (ref >60)
GFR, EST NON AFRICAN AMERICAN: 31 mL/min — AB (ref >60)
GLUCOSE: 166 mg/dL — AB (ref 65–99)
POTASSIUM: 4.4 mmol/L (ref 3.5–5.1)
SODIUM: 127 mmol/L — AB (ref 135–145)

## 2017-03-12 LAB — URINALYSIS, COMPLETE (UACMP) WITH MICROSCOPIC
BACTERIA UA: NONE SEEN
Bilirubin Urine: NEGATIVE
Glucose, UA: 50 mg/dL — AB
Hgb urine dipstick: NEGATIVE
Ketones, ur: NEGATIVE mg/dL
Leukocytes, UA: NEGATIVE
Nitrite: NEGATIVE
Protein, ur: NEGATIVE mg/dL
SPECIFIC GRAVITY, URINE: 1.013 (ref 1.005–1.030)
pH: 5 (ref 5.0–8.0)

## 2017-03-12 LAB — PH, BODY FLUID: pH, Body Fluid: 7.8

## 2017-03-12 LAB — GLUCOSE, CAPILLARY
Glucose-Capillary: 100 mg/dL — ABNORMAL HIGH (ref 65–99)
Glucose-Capillary: 171 mg/dL — ABNORMAL HIGH (ref 65–99)
Glucose-Capillary: 251 mg/dL — ABNORMAL HIGH (ref 65–99)

## 2017-03-12 LAB — MISC LABCORP TEST (SEND OUT): Labcorp test code: 19588

## 2017-03-12 LAB — TRIGLYCERIDES, BODY FLUIDS: TRIGLYCERIDES FL: 13 mg/dL

## 2017-03-12 MED ORDER — MENTHOL 3 MG MT LOZG
1.0000 | LOZENGE | OROMUCOSAL | Status: DC | PRN
  Filled 2017-03-12: qty 9

## 2017-03-12 MED ORDER — SENNA 8.6 MG PO TABS
2.0000 | ORAL_TABLET | Freq: Every day | ORAL | Status: DC
  Administered 2017-03-12: 17.2 mg via ORAL
  Filled 2017-03-12: qty 2

## 2017-03-12 MED ORDER — POLYETHYLENE GLYCOL 3350 17 G PO PACK
17.0000 g | PACK | Freq: Two times a day (BID) | ORAL | Status: DC
  Administered 2017-03-12 (×2): 17 g via ORAL
  Filled 2017-03-12 (×3): qty 1

## 2017-03-12 MED ORDER — SODIUM CHLORIDE 0.9 % IV SOLN
INTRAVENOUS | Status: AC
  Administered 2017-03-12: 17:00:00 via INTRAVENOUS

## 2017-03-12 MED ORDER — LORAZEPAM 0.5 MG PO TABS
0.2500 mg | ORAL_TABLET | Freq: Every day | ORAL | Status: DC
  Administered 2017-03-12: 23:00:00 0.25 mg via ORAL
  Filled 2017-03-12: qty 1

## 2017-03-12 NOTE — Progress Notes (Signed)
Assisted pt x 2 to [facility]. Pt was able to void 300 cc.  Specimen sent to lab.  Pt refused to get back into bed so unable to get daily wt. Dorna Bloom RN

## 2017-03-12 NOTE — Progress Notes (Signed)
Inpatient Diabetes Program Recommendations  AACE/ADA: New Consensus Statement on Inpatient Glycemic Control (2015)  Target Ranges:  Prepandial:   less than 140 mg/dL      Peak postprandial:   less than 180 mg/dL (1-2 hours)      Critically ill patients:  140 - 180 mg/dL   Results for Donna Lane, Donna Lane (MRN [id number]) as of 03/12/2017 10:04  Ref. Range 03/11/2017 07:54 03/11/2017 13:26 03/11/2017 17:20 03/11/2017 20:14 03/12/2017 07:52  Glucose-Capillary Latest Ref Range: 65 - 99 mg/dL 208 (H) 241 (H) 308 (H) 286 (H) 100 (H)   Review of Glycemic Control  Diabetes history: DM2 Outpatient Diabetes medications: Levemir 3-5 units QHS PRN, Glipizide XL 5 mg QAM Current orders for Inpatient glycemic control: Levemir 4 units QHS, Novolog 0-9 units TID with meals, Novolog 0-5 units QHS   Inpatient Diabetes Program Recommendations:  Insulin - Meal Coverage: If patient is eating at least 50% of meals, please consider ordering Novolog 4 units TID with meals.  Thanks, Barnie Alderman, RN, MSN, CDE Diabetes Coordinator Inpatient Diabetes Program [phone] (Team Pager from 8am to 5pm)

## 2017-03-12 NOTE — Care Management (Addendum)
Family discussing discharge planning issues and concerns. Torn between being able to meet care needs of patient in the home and short term rehab. Palliative care is involved. Cytology pending from thoracentesis . THN refferal  made

## 2017-03-12 NOTE — Progress Notes (Signed)
Medications patient is a [facility] at [place] NAME: Donna Lane    MR#:  [id number]  DATE OF BIRTH:  [birth year]  SUBJECTIVE:  CHIEF COMPLAINT:  Shortness of breath is better.Had a thoracentesis 3/28 tolerated procedure   REVIEW OF SYSTEMS:  CONSTITUTIONAL: No fever, fatigue or weakness.  EYES: No blurred or double vision.  EARS, NOSE, AND THROAT: No tinnitus or ear pain.  RESPIRATORY: Reports some cough, denies shortness of breath, wheezing or hemoptysis.  CARDIOVASCULAR: No chest pain, orthopnea, edema.  GASTROINTESTINAL: No nausea, vomiting, diarrhea or abdominal pain.  GENITOURINARY: No dysuria, hematuria.  ENDOCRINE: No polyuria, nocturia,  HEMATOLOGY: No anemia, easy bruising or bleeding SKIN: No rash or lesion. MUSCULOSKELETAL: No joint pain or arthritis.   NEUROLOGIC: No tingling, numbness, weakness.  PSYCHIATRY: No anxiety or depression.   DRUG ALLERGIES:   Allergies  Allergen Reactions  . Nsaids Other (See Comments)    Would avoid due to CKD.... Pt states she not allergic    VITALS:  Blood pressure (!) 113/48, pulse 99, temperature 98.4 F (36.9 C), temperature source Oral, resp. rate 20, height '5\' 2"'$  (1.575 m), weight 63.5 kg (140 lb), SpO2 95 %.  PHYSICAL EXAMINATION:  GENERAL:  83 y.o.-year-old patient lying in the bed with no acute distress.  EYES: Pupils equal, round, reactive to light and accommodation. No scleral icterus. Extraocular muscles intact.  HEENT: Head atraumatic, normocephalic. Oropharynx and nasopharynx clear.  NECK:  Supple, no jugular venous distention. No thyroid enlargement, no tenderness.  LUNGS:  moderately diminished breath sounds bilaterallyMore on the right side than left no wheezing, rales,rhonchi or crepitation. No use of accessory muscles of respiration.  CARDIOVASCULAR: S1, S2 normal. No murmurs, rubs, or gallops.  ABDOMEN: Soft, nontender, nondistended. Bowel sounds present.  No organomegaly or mass.  EXTREMITIES: No pedal edema, cyanosis, or clubbing. Left upper extremity is edematous with the erythematous papular rash and weeping fluid NEUROLOGIC: Cranial nerves II through XII are intact. Muscle strength 5/5 in all extremities. Sensation intact. Gait not checked.  PSYCHIATRIC: The patient is alert and oriented x 3.  SKIN: No obvious rash, lesion, or ulcer.    LABORATORY PANEL:   CBC  Recent Labs Lab 03/09/17 0514  WBC 25.7*  HGB 10.7*  HCT 32.5*  PLT 237   ------------------------------------------------------------------------------------------------------------------  Chemistries   Recent Labs Lab 03/07/17 2238  03/12/17 1157  NA 127*  < > 127*  K 3.2*  < > 4.4  CL 87*  < > 94*  CO2 29  < > 25  GLUCOSE 243*  < > 166*  BUN 35*  < > 32*  CREATININE 1.31*  < > 1.45*  CALCIUM 9.0  < > 9.0  AST 33  --   --   ALT 23  --   --   ALKPHOS 106  --   --   BILITOT 0.8  --   --   < > = values in this interval not displayed. ------------------------------------------------------------------------------------------------------------------  Cardiac Enzymes  Recent Labs Lab 03/07/17 2238  TROPONINI <0.03   ------------------------------------------------------------------------------------------------------------------  RADIOLOGY:  Dg Chest 1 View  Result Date: 03/11/2017 CLINICAL DATA:  Status post right thoracentesis. EXAM: CHEST 1 VIEW COMPARISON:  Radiographs of March 07, 2017. FINDINGS: Stable cardiomediastinal silhouette. Atherosclerosis of thoracic aorta is noted. Status post coronary artery bypass graft. No pneumothorax is noted. Mild left pleural effusion is noted. Right pleural effusion is noted which is improved compared to prior exam. Bony thorax  is unremarkable. IMPRESSION: No pneumothorax status post right-sided thoracentesis. Electronically Signed   By: Marijo Conception, M.D.   On: 03/11/2017 11:40   US Venous Img Upper Uni  Left  Result Date: 03/10/2017 CLINICAL DATA:  Left hand swelling EXAM: LEFT UPPER EXTREMITY VENOUS DOPPLER ULTRASOUND TECHNIQUE: Gray-scale sonography with graded compression, as well as color Doppler and duplex ultrasound were performed to evaluate the upper extremity deep venous system from the level of the subclavian vein and including the jugular, axillary, basilic, radial, ulnar and upper cephalic vein. Spectral Doppler was utilized to evaluate flow at rest and with distal augmentation maneuvers. COMPARISON:  None. FINDINGS: Contralateral Subclavian Vein: Respiratory phasicity is normal and symmetric with the symptomatic side. No evidence of thrombus. Normal compressibility. Internal Jugular Vein: No evidence of thrombus. Normal compressibility, respiratory phasicity and response to augmentation. Subclavian Vein: No evidence of thrombus. Normal compressibility, respiratory phasicity and response to augmentation. Axillary Vein: No evidence of thrombus. Normal compressibility, respiratory phasicity and response to augmentation. Cephalic Vein: No evidence of thrombus. Normal compressibility, respiratory phasicity and response to augmentation. Basilic Vein: No evidence of thrombus. Normal compressibility, respiratory phasicity and response to augmentation. Brachial Veins: No evidence of thrombus. Normal compressibility, respiratory phasicity and response to augmentation. Radial Veins: No evidence of thrombus. Normal compressibility, respiratory phasicity and response to augmentation. Ulnar Veins: No evidence of thrombus. Normal compressibility, respiratory phasicity and response to augmentation. Venous Reflux:  None visualized. Other Findings:  None visualized. IMPRESSION: No evidence of deep venous thrombosis. Electronically Signed   By: Julian Hy M.D.   On: 03/10/2017 16:33   US Thoracentesis Asp Pleural Space W/img Guide  Result Date: 03/11/2017 INDICATION: Right pleural effusion. EXAM: ULTRASOUND  GUIDED right THORACENTESIS MEDICATIONS: None. COMPLICATIONS: None immediate. PROCEDURE: An ultrasound guided thoracentesis was thoroughly discussed with the patient and questions answered. The benefits, risks, alternatives and complications were also discussed. The patient understands and wishes to proceed with the procedure. Written consent was obtained. Ultrasound was performed to localize and mark an adequate pocket of fluid in the right chest. The area was then prepped and draped in the normal sterile fashion. 1% Lidocaine was used for local anesthesia. Under ultrasound guidance a 8 Fr Safe-T-Centesis catheter was introduced. Thoracentesis was performed. The catheter was removed and a dressing applied. FINDINGS: A total of approximately 1500 mL of serous fluid was removed. Samples were sent to the laboratory as requested by the clinical team. IMPRESSION: Successful ultrasound guided right thoracentesis yielding 1.5 L of pleural fluid. Electronically Signed   By: Marijo Conception, M.D.   On: 03/11/2017 11:41    EKG:   Orders placed or performed during the hospital encounter of 03/07/17  . EKG 12-Lead  . EKG 12-Lead  . ED EKG  . ED EKG  . EKG 12-Lead  . EKG 12-Lead    ASSESSMENT AND PLAN:   This is an 83 year old female admitted for sepsis.   #  Sepsis: The patient meets criteria via leukocytosis and tachypnea. Secondary to healthcare associated pneumonia, Clinically improving  blood cultures are negative so far. MRSA PCR is negative   ContinueTapering steroids and change IV Zosyn to by mouth Augmentin  # Pneumonia: Healthcare associated; post-obstructive.  Clinically improved with Zosyn. Discontinue vancomycin as MRSA PCR is negative. Changed to by mouth Augmentin Oxygen as needed and wean off as tolerated  patient had ultrasound-guided thoracocentesis which is both therapeutic and diagnostic, cultures are negative so far , follow-up thoracocentesis labs   # Lung  mass: Status post  bronchoscopy and pulmonology consultation as an outpatient, patient was seen by Dr. Mortimer Fries before and agreeable to follow-up with him during the hospital course  Patient refused any further aggressive workup for right lung mass but agreeable with right thoracentesis,  Patient had right-sided thoracentesis today tolerated procedure well , fluid was sent for thoracentesis labs including culture  Follow-up with palliative care. Follow-up with oncology as an OP appreciate their recommendations  #Left upper extremity edema with weeping fluid-third spacing Ultrasound is negative for DVT Clinically better todayand it is noticed Encouraged patient to keep her left upper extremity elevated and compression dressing Follow-up with wound care  #Hyponatremia could be from SIADH from the lung mass Sodium at 129--127 Provided gentle hydration with IV fluids and check BMP in a.m.  # Diabetes mellitus type 2: Continue basal insulin as well as sliding scale insulin hospitalized  # Atrial fibrillation: Status post cardiovesion 03/07/17  Currently rate controlled.  Continue Eliquis, diltiazem and amiodarone as patient is refusing any kind of procedures at this time     #. Hyperlipidemia: Continue statin therapy  #. CKD: Stage III. Avoid nephrotoxic agents   8. DVT prophylaxis: Full dose anticoagulation eliquis 9. GI prophylaxis: None  Follow-up with palliative care-,PT is recommending skilled nursing facility Disposition-patient was not quite sure whether she has to go to rehabilitation Center on home. Discussed with daughter [phone] she will discuss with other family members and it is getting discharged tomorrow Follow-up with social worker  All the records are reviewed and case discussed with Care Management/Social Workerr. Management plans discussed with the patient, niece and they are in agreement. Patient's daughter is out of town Will return tomorrow night    [place] THIS PATIENT: 38 minutes.   POSSIBLE D/C IN 1 DAYS, DEPENDING ON CLINICAL CONDITION.  Note: This dictation was prepared with Dragon dictation along with smaller phrase technology. Any transcriptional errors that result from this process are unintentional.   [name] M.D on 03/12/2017 at 3:21 PM  Between 7am to 6pm - Pager - [phone] After 6pm go to [email or web address] - password [email or web address] [facility]  Office  [phone]  CC: Primary care physician; [name], MD

## 2017-03-12 NOTE — Progress Notes (Signed)
Patient sitting up in recliner.  She states the thoracentesis helped her breathing at first, but now she is short of breath again.  She also complains of constipation (no bowel mvmt since admission) and insomnia.  The edema in her arms has improved.  We discussed a plan to add senna qhs and prune juice.  Will add very low dose ativan QHS at bed time.   Patient still very worried about not being at home to take care of her husband.  She is waiting for the cytology results of her fluid.  A&O, NAD Lungs with no w/c/r , but decreased breath sounds. CV rrr no m/r/g  PMT will continue to follow and support holistically.  Total time:  25 min.   Imogene Burn, [facility] Pager: [phone]

## 2017-03-12 NOTE — Progress Notes (Addendum)
Pt has not voided since 0900 03/11/17.  Asked pt to get up to [facility] to try and void.  Pt refused and stated that she doesn't have to go. Bladder scan for 247 cc.  Call out to [facility] for further instructions. Dorna Bloom RN Received call from Dr Jannifer Franklin.  Recommends encourage fluids.  Dorna Bloom RN

## 2017-03-12 NOTE — Evaluation (Addendum)
Physical Therapy Evaluation Patient Details Name: Donna Lane MRN: [id number] DOB: 28-Apr-1925 Today's Date: 03/12/2017   History of Present Illness  Pt admitted for sepsis secondary to pneumonia. Pt with history of Afib, CAD s/p CABG, DM, and cardioversion on 3/29. Of note, pt with lung mass. Pt had thoracentesis on 3/27 and complains of LE edema.   Clinical Impression  Pt is a pleasant 83 year old female who was admitted for sepsis secondary to pneumonia. Pt performs transfers and ambulation with min assist and RW. O2 donned for all mobility. Pt demonstrates deficits with strength/mobility/balance. Pt fatigues quickly with limited in room ambulation and needs RW for all mobility to prevent falls and improve balance. Pt is currently not at baseline level. Would benefit from skilled PT to address above deficits and promote optimal return to PLOF; recommend transition to STR upon discharge from acute hospitalization.       Follow Up Recommendations SNF    Equipment Recommendations  None recommended by PT    Recommendations for Other Services       Precautions / Restrictions Precautions Precautions: Fall Restrictions Weight Bearing Restrictions: No      Mobility  Bed Mobility               General bed mobility comments: not performed as pt received in recliner  Transfers Overall transfer level: Needs assistance Equipment used: Rolling walker (2 wheeled) Transfers: Sit to/from Stand Sit to Stand: Min assist         General transfer comment: needs assist for standing and cues for pushing from seated surface. Once standing, upright posture noted. O2 sats at 90% on 2L of O2 for all mobility  Ambulation/Gait Ambulation/Gait assistance: Min assist Ambulation Distance (Feet): 40 Feet Assistive device: Rolling walker (2 wheeled) Gait Pattern/deviations: Step-through pattern     General Gait Details: slow gait speed noted with pt reporting feeling dizzy. Small  reciprocal gait pattern and unsteadiness present.  Stairs            Wheelchair Mobility    Modified [place] (Stroke Patients Only)       Balance Overall balance assessment: Needs assistance Sitting-balance support: Feet supported Sitting balance-Leahy Scale: Good     Standing balance support: Bilateral upper extremity supported Standing balance-Leahy Scale: Good                               Pertinent Vitals/Pain Pain Assessment: Faces Faces Pain Scale: Hurts a little bit Pain Location: L arm Pain Descriptors / Indicators: Discomfort Pain Intervention(s): Limited activity within patient's tolerance    Home Living Family/patient expects to be discharged to:: Private residence Living Arrangements: Spouse/significant other Available Help at Discharge: Family Type of Home: House Home Access: Stairs to enter   Technical brewer of Steps: 3 Home Layout: One level Home Equipment: Environmental consultant - 2 wheels;Walker - 4 wheels      Prior Function Level of Independence: Independent with assistive device(s)         Comments: 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; uses SPC in community      Hand Dominance        Extremity/Trunk Assessment   Upper Extremity Assessment Upper Extremity Assessment: Generalized weakness (B UE grossly 3+/5)    Lower Extremity Assessment Lower Extremity Assessment: Generalized weakness (B LE grossly 4/5)       Communication   Communication: No difficulties  Cognition Arousal/Alertness: Awake/alert Behavior During Therapy: WFL for tasks assessed/performed  Overall Cognitive Status: Within Functional Limits for tasks assessed                                        General Comments      Exercises Other Exercises Other Exercises: Seated ther-ex performed on B LE including ankle pumps, quad sets, SLRs, and hip abd/add. All ther-ex performed x 10 reps with min assist.   Assessment/Plan    PT Assessment Patient  needs continued PT services  PT Problem List Decreased strength;Decreased activity tolerance;Decreased balance;Decreased mobility;Pain       PT Treatment Interventions Gait training;DME instruction;Therapeutic activities;Therapeutic exercise    PT Goals (Current goals can be found in the Care Plan section)  Acute Rehab PT Goals Patient Stated Goal: to get stronger and then go home PT Goal Formulation: With patient Time For Goal Achievement: 03/26/17 Potential to Achieve Goals: Good Additional Goals Additional Goal #1: Pt will be able to perform bed mobility/transfers with mod I in order to improve functional independence    Frequency Min 2X/week   Barriers to discharge Decreased caregiver support      Co-evaluation               End of Session Equipment Utilized During Treatment: Gait belt;Oxygen Activity Tolerance: Patient tolerated treatment well Patient left: in chair;with chair alarm set Nurse Communication: Mobility status PT Visit Diagnosis: Unsteadiness on feet (R26.81);Muscle weakness (generalized) (M62.81);Difficulty in walking, not elsewhere classified (R26.2);Pain Pain - Right/Left: Left Pain - part of body: Arm    Time: 1100-1118 PT Time Calculation (min) (ACUTE ONLY): 18 min   Charges:   PT Evaluation $PT Eval Moderate Complexity: 1 Procedure PT Treatments $Therapeutic Exercise: 8-22 mins   PT G Codes:        Greggory Stallion, PT, DPT [phone]   Donna Lane 03/12/2017, 3:14 PM

## 2017-03-13 DIAGNOSIS — E1122 Type 2 diabetes mellitus with diabetic chronic kidney disease: Secondary | ICD-10-CM | POA: Diagnosis present

## 2017-03-13 DIAGNOSIS — Z9981 Dependence on supplemental oxygen: Secondary | ICD-10-CM | POA: Diagnosis not present

## 2017-03-13 DIAGNOSIS — J449 Chronic obstructive pulmonary disease, unspecified: Secondary | ICD-10-CM | POA: Diagnosis not present

## 2017-03-13 DIAGNOSIS — I5030 Unspecified diastolic (congestive) heart failure: Secondary | ICD-10-CM | POA: Diagnosis not present

## 2017-03-13 DIAGNOSIS — F339 Major depressive disorder, recurrent, unspecified: Secondary | ICD-10-CM | POA: Diagnosis not present

## 2017-03-13 DIAGNOSIS — I5033 Acute on chronic diastolic (congestive) heart failure: Secondary | ICD-10-CM | POA: Diagnosis present

## 2017-03-13 DIAGNOSIS — J189 Pneumonia, unspecified organism: Secondary | ICD-10-CM | POA: Diagnosis not present

## 2017-03-13 DIAGNOSIS — Z66 Do not resuscitate: Secondary | ICD-10-CM

## 2017-03-13 DIAGNOSIS — I10 Essential (primary) hypertension: Secondary | ICD-10-CM | POA: Diagnosis not present

## 2017-03-13 DIAGNOSIS — E43 Unspecified severe protein-calorie malnutrition: Secondary | ICD-10-CM | POA: Diagnosis present

## 2017-03-13 DIAGNOSIS — J9811 Atelectasis: Secondary | ICD-10-CM | POA: Diagnosis present

## 2017-03-13 DIAGNOSIS — I2510 Atherosclerotic heart disease of native coronary artery without angina pectoris: Secondary | ICD-10-CM | POA: Diagnosis not present

## 2017-03-13 DIAGNOSIS — C342 Malignant neoplasm of middle lobe, bronchus or lung: Secondary | ICD-10-CM | POA: Diagnosis not present

## 2017-03-13 DIAGNOSIS — I4891 Unspecified atrial fibrillation: Secondary | ICD-10-CM | POA: Diagnosis not present

## 2017-03-13 DIAGNOSIS — E875 Hyperkalemia: Secondary | ICD-10-CM | POA: Diagnosis present

## 2017-03-13 DIAGNOSIS — R7989 Other specified abnormal findings of blood chemistry: Secondary | ICD-10-CM | POA: Diagnosis not present

## 2017-03-13 DIAGNOSIS — J918 Pleural effusion in other conditions classified elsewhere: Secondary | ICD-10-CM | POA: Diagnosis not present

## 2017-03-13 DIAGNOSIS — L89150 Pressure ulcer of sacral region, unstageable: Secondary | ICD-10-CM | POA: Diagnosis present

## 2017-03-13 DIAGNOSIS — R41841 Cognitive communication deficit: Secondary | ICD-10-CM | POA: Diagnosis not present

## 2017-03-13 DIAGNOSIS — R2232 Localized swelling, mass and lump, left upper limb: Secondary | ICD-10-CM | POA: Diagnosis not present

## 2017-03-13 DIAGNOSIS — E784 Other hyperlipidemia: Secondary | ICD-10-CM | POA: Diagnosis not present

## 2017-03-13 DIAGNOSIS — E1165 Type 2 diabetes mellitus with hyperglycemia: Secondary | ICD-10-CM | POA: Diagnosis present

## 2017-03-13 DIAGNOSIS — E1151 Type 2 diabetes mellitus with diabetic peripheral angiopathy without gangrene: Secondary | ICD-10-CM | POA: Diagnosis present

## 2017-03-13 DIAGNOSIS — E039 Hypothyroidism, unspecified: Secondary | ICD-10-CM | POA: Diagnosis present

## 2017-03-13 DIAGNOSIS — R918 Other nonspecific abnormal finding of lung field: Secondary | ICD-10-CM | POA: Diagnosis not present

## 2017-03-13 DIAGNOSIS — M6281 Muscle weakness (generalized): Secondary | ICD-10-CM | POA: Diagnosis not present

## 2017-03-13 DIAGNOSIS — E871 Hypo-osmolality and hyponatremia: Secondary | ICD-10-CM | POA: Diagnosis not present

## 2017-03-13 DIAGNOSIS — C50912 Malignant neoplasm of unspecified site of left female breast: Secondary | ICD-10-CM | POA: Diagnosis not present

## 2017-03-13 DIAGNOSIS — R498 Other voice and resonance disorders: Secondary | ICD-10-CM | POA: Diagnosis not present

## 2017-03-13 DIAGNOSIS — R1312 Dysphagia, oropharyngeal phase: Secondary | ICD-10-CM | POA: Diagnosis not present

## 2017-03-13 DIAGNOSIS — R531 Weakness: Secondary | ICD-10-CM | POA: Diagnosis not present

## 2017-03-13 DIAGNOSIS — I130 Hypertensive heart and chronic kidney disease with heart failure and stage 1 through stage 4 chronic kidney disease, or unspecified chronic kidney disease: Secondary | ICD-10-CM | POA: Diagnosis present

## 2017-03-13 DIAGNOSIS — J90 Pleural effusion, not elsewhere classified: Secondary | ICD-10-CM | POA: Diagnosis not present

## 2017-03-13 DIAGNOSIS — C799 Secondary malignant neoplasm of unspecified site: Secondary | ICD-10-CM | POA: Diagnosis present

## 2017-03-13 DIAGNOSIS — J9600 Acute respiratory failure, unspecified whether with hypoxia or hypercapnia: Secondary | ICD-10-CM | POA: Diagnosis not present

## 2017-03-13 DIAGNOSIS — I248 Other forms of acute ischemic heart disease: Secondary | ICD-10-CM | POA: Diagnosis present

## 2017-03-13 DIAGNOSIS — Z7189 Other specified counseling: Secondary | ICD-10-CM | POA: Diagnosis not present

## 2017-03-13 DIAGNOSIS — A419 Sepsis, unspecified organism: Secondary | ICD-10-CM | POA: Diagnosis not present

## 2017-03-13 DIAGNOSIS — I482 Chronic atrial fibrillation: Secondary | ICD-10-CM | POA: Diagnosis present

## 2017-03-13 DIAGNOSIS — E785 Hyperlipidemia, unspecified: Secondary | ICD-10-CM | POA: Diagnosis not present

## 2017-03-13 DIAGNOSIS — E119 Type 2 diabetes mellitus without complications: Secondary | ICD-10-CM | POA: Diagnosis not present

## 2017-03-13 DIAGNOSIS — Z515 Encounter for palliative care: Secondary | ICD-10-CM | POA: Diagnosis not present

## 2017-03-13 DIAGNOSIS — Z5189 Encounter for other specified aftercare: Secondary | ICD-10-CM | POA: Diagnosis not present

## 2017-03-13 DIAGNOSIS — N179 Acute kidney failure, unspecified: Secondary | ICD-10-CM | POA: Diagnosis present

## 2017-03-13 DIAGNOSIS — N183 Chronic kidney disease, stage 3 (moderate): Secondary | ICD-10-CM | POA: Diagnosis present

## 2017-03-13 DIAGNOSIS — C50911 Malignant neoplasm of unspecified site of right female breast: Secondary | ICD-10-CM | POA: Diagnosis not present

## 2017-03-13 DIAGNOSIS — R601 Generalized edema: Secondary | ICD-10-CM | POA: Diagnosis not present

## 2017-03-13 DIAGNOSIS — J159 Unspecified bacterial pneumonia: Secondary | ICD-10-CM | POA: Diagnosis not present

## 2017-03-13 DIAGNOSIS — R2681 Unsteadiness on feet: Secondary | ICD-10-CM | POA: Diagnosis not present

## 2017-03-13 DIAGNOSIS — I5032 Chronic diastolic (congestive) heart failure: Secondary | ICD-10-CM | POA: Diagnosis not present

## 2017-03-13 DIAGNOSIS — R05 Cough: Secondary | ICD-10-CM | POA: Diagnosis not present

## 2017-03-13 DIAGNOSIS — R609 Edema, unspecified: Secondary | ICD-10-CM | POA: Diagnosis not present

## 2017-03-13 DIAGNOSIS — B370 Candidal stomatitis: Secondary | ICD-10-CM | POA: Diagnosis present

## 2017-03-13 DIAGNOSIS — N289 Disorder of kidney and ureter, unspecified: Secondary | ICD-10-CM | POA: Diagnosis not present

## 2017-03-13 DIAGNOSIS — D519 Vitamin B12 deficiency anemia, unspecified: Secondary | ICD-10-CM | POA: Diagnosis not present

## 2017-03-13 LAB — CBC
HEMATOCRIT: 34.7 % — AB (ref 35.0–47.0)
HEMOGLOBIN: 11.8 g/dL — AB (ref 12.0–16.0)
MCH: 28.5 pg (ref 26.0–34.0)
MCHC: 33.9 g/dL (ref 32.0–36.0)
MCV: 84.1 fL (ref 80.0–100.0)
Platelets: 216 10*3/uL (ref 150–440)
RBC: 4.13 MIL/uL (ref 3.80–5.20)
RDW: 14.5 % (ref 11.5–14.5)
WBC: 28.4 10*3/uL — AB (ref 3.6–11.0)

## 2017-03-13 LAB — CULTURE, BLOOD (ROUTINE X 2)
CULTURE: NO GROWTH
CULTURE: NO GROWTH
SPECIAL REQUESTS: ADEQUATE
Special Requests: ADEQUATE

## 2017-03-13 LAB — BASIC METABOLIC PANEL
ANION GAP: 8 (ref 5–15)
BUN: 29 mg/dL — ABNORMAL HIGH (ref 6–20)
CHLORIDE: 97 mmol/L — AB (ref 101–111)
CO2: 24 mmol/L (ref 22–32)
Calcium: 8.7 mg/dL — ABNORMAL LOW (ref 8.9–10.3)
Creatinine, Ser: 1.24 mg/dL — ABNORMAL HIGH (ref 0.44–1.00)
GFR calc non Af Amer: 37 mL/min — ABNORMAL LOW (ref >60)
GFR, EST AFRICAN AMERICAN: 43 mL/min — AB (ref >60)
GLUCOSE: 98 mg/dL (ref 65–99)
POTASSIUM: 4.8 mmol/L (ref 3.5–5.1)
Sodium: 129 mmol/L — ABNORMAL LOW (ref 135–145)

## 2017-03-13 LAB — GLUCOSE, CAPILLARY
Glucose-Capillary: 226 mg/dL — ABNORMAL HIGH (ref 65–99)
Glucose-Capillary: 192 mg/dL — ABNORMAL HIGH (ref 65–99)
Glucose-Capillary: 98 mg/dL (ref 65–99)
Glucose-Capillary: 146 mg/dL — ABNORMAL HIGH (ref 65–99)
Glucose-Capillary: 157 mg/dL — ABNORMAL HIGH (ref 65–99)

## 2017-03-13 LAB — CYTOLOGY - NON PAP

## 2017-03-13 MED ORDER — BARRIER CREAM NON-SPECIFIED: 1.0000 "application " | TOPICAL_CREAM | Freq: Two times a day (BID) | TOPICAL | 0 refills | Status: DC | PRN

## 2017-03-13 MED ORDER — MENTHOL 3 MG MT LOZG: 1.0000 | LOZENGE | OROMUCOSAL | 12 refills | Status: DC | PRN

## 2017-03-13 MED ORDER — AMOXICILLIN-POT CLAVULANATE 500-125 MG PO TABS: 1.0000 | ORAL_TABLET | Freq: Two times a day (BID) | ORAL | 0 refills | Status: DC

## 2017-03-13 MED ORDER — AMOXICILLIN-POT CLAVULANATE 500-125 MG PO TABS
1.0000 | ORAL_TABLET | Freq: Two times a day (BID) | ORAL | Status: DC
  Administered 2017-03-13: 11:00:00 500 mg via ORAL
  Filled 2017-03-13: qty 1

## 2017-03-13 MED ORDER — IPRATROPIUM-ALBUTEROL 0.5-2.5 (3) MG/3ML IN SOLN: 3.0000 mL | Freq: Four times a day (QID) | RESPIRATORY_TRACT | Status: AC

## 2017-03-13 MED ORDER — OXYCODONE HCL 5 MG PO TABS: 2.5000 mg | ORAL_TABLET | ORAL | 0 refills | Status: DC | PRN

## 2017-03-13 MED ORDER — TRAZODONE HCL 50 MG PO TABS: 25.0000 mg | ORAL_TABLET | Freq: Every day | ORAL | 0 refills | Status: AC

## 2017-03-13 MED ORDER — DOCUSATE SODIUM 100 MG PO CAPS: 100.0000 mg | ORAL_CAPSULE | Freq: Two times a day (BID) | ORAL | 0 refills | Status: AC | PRN

## 2017-03-13 MED ORDER — INSULIN DETEMIR 100 UNIT/ML ~~LOC~~ SOLN: 4.0000 [IU] | Freq: Every day | SUBCUTANEOUS | 11 refills | Status: DC

## 2017-03-13 MED ORDER — POLYETHYLENE GLYCOL 3350 17 G PO PACK: 17.0000 g | PACK | Freq: Every day | ORAL | Status: DC | PRN

## 2017-03-13 MED ORDER — DIPHENHYDRAMINE HCL 25 MG PO CAPS: 25.0000 mg | ORAL_CAPSULE | Freq: Every evening | ORAL | 0 refills | Status: AC | PRN

## 2017-03-13 MED ORDER — BARRIER CREAM NON-SPECIFIED
1.0000 "application " | TOPICAL_CREAM | Freq: Two times a day (BID) | TOPICAL | Status: DC | PRN
  Filled 2017-03-13: qty 1

## 2017-03-13 MED ORDER — ONDANSETRON HCL 4 MG PO TABS: 4.0000 mg | ORAL_TABLET | Freq: Four times a day (QID) | ORAL | 0 refills | Status: AC | PRN

## 2017-03-13 NOTE — Progress Notes (Signed)
Daily Progress Note   Patient Name: Donna Lane       Date: 03/13/2017 DOB: 1925-06-28  Age: 83 y.o. MRN#: [id number] Attending Physician: [name], MD Primary Care Physician: [name], MD Admit Date: 03/07/2017  Reason for Consultation/Follow-up: Establishing goals of care  Subjective: Patient reports a non productive cough.  She feels her breathing is not improving and that the fluid has come back.  She is having pain in her left chest.  She points to the area between her ribs and breast.  We discussed both her heart failure and the two lung masses.  Either or both of these could be causing fluid to accumulate in her lungs.  Her arm is weeping again.  With regard to the probable cancer she comments again that she thinks she would be willing to take radiation but does not want chemotherapy.  She is hopeful to go home rather than to rehab and wants to try to walk again today with physical therapy.  She did sleep well last night and has had multiple bowel movements.  We discussed code status.  I mentioned resuscitation including chest compressions.  She replied " I wouldn't want that".  Donna Lane came to the hospital about an hour later and we spoke outside the patient's room.   I expressed concern that the lung masses were cancer and that the patient's fluid had re-accumulated.  Donna Lane is waiting on cytology/pathology before declaring the masses are cancer.  She does support her mother's decision for DNR.  I also told Donna Lane that if her mother decides not to pursue radiation, then she is eligible for hospice services.  Donna Lane was appreciative of the information.  Donna Lane is inclined to encourage her mother to go to [facility]Rehab rather than return directly to the  home.   Assessment: 83 yo female with CAD, D-HF, PVD, and Pleural effusions w lung masses.  She wants to be at home with her husband as long as possible.  She wants quality of life over quantity (no chemo).  Now DNR.    Patient Profile/HPI: 83 y.o. female  with past medical history of CAD/CABG, COPD, Afib, CKD, DM, PVD, and lung mass (found in 11/2016) who was admitted on 03/07/2017 with sepsis secondary to post obstructive pneumonia and persistent atrial fibrillation.  She underwent cardioversion without complication  on 3/23.   Currently she is in the hospital feeling much better but is having difficulty with swelling in her LUE.  PMT was consulted for [place] with regard to thoracentesis, code status and on-going care.   Length of Stay: 5  Current Medications: Scheduled Meds:  . amiodarone  200 mg Oral BID  . amoxicillin-clavulanate  1 tablet Oral Q12H  . apixaban  2.5 mg Oral BID  . atorvastatin  10 mg Oral q1800  . budesonide (PULMICORT) nebulizer solution  0.5 mg Nebulization BID  . diltiazem  240 mg Oral Daily  . docusate sodium  100 mg Oral BID  . insulin aspart  0-5 Units Subcutaneous QHS  . insulin aspart  0-9 Units Subcutaneous TID WC  . insulin detemir  4 Units Subcutaneous QHS  . ipratropium-albuterol  3 mL Nebulization QID  . LORazepam  0.25 mg Oral QHS  . mouth rinse  15 mL Mouth Rinse BID  . polyethylene glycol  17 g Oral BID  . sodium chloride flush  3 mL Intravenous Q12H  . traZODone  25 mg Oral QHS  . cyanocobalamin  1,000 mcg Oral Daily    Continuous Infusions:   PRN Meds: acetaminophen **OR** acetaminophen, diphenhydrAMINE, menthol-cetylpyridinium, nitroGLYCERIN, ondansetron **OR** ondansetron (ZOFRAN) IV, oxyCODONE  Physical Exam       2 of 6  Pleasant, chronically ill appearing female, sitting in recliner on n/c NAD, A&O.    Vital Signs: BP (!) 125/50 (BP Location: Right Arm)   Pulse 97   Temp 98.5 F (36.9 C) (Oral)   Resp 20   Ht '5\' 2"'$  (1.575 m)    Wt 63.6 kg (140 lb 3.2 oz)   SpO2 97%   BMI 25.64 kg/m  SpO2: SpO2: 97 % O2 Device: O2 Device: Nasal Cannula O2 Flow Rate: O2 Flow Rate (L/min): 2 L/min  Intake/output summary:  Intake/Output Summary (Last 24 hours) at 03/13/17 0931 Last data filed at 03/13/17 0211  Gross per 24 hour  Intake            447.5 ml  Output                0 ml  Net            447.5 ml   LBM: Last BM Date: 03/13/17 Baseline Weight: Weight: 55.8 kg (123 lb) Most recent weight: Weight: 63.6 kg (140 lb 3.2 oz)       Palliative Assessment/Data:    Flowsheet Rows     Most Recent Value  Intake Tab  Referral Department  Hospitalist  Unit at Time of Referral  Cardiac/Telemetry Unit  Palliative Care Primary Diagnosis  Pulmonary  Date Notified  03/09/17  Palliative Care Type  New Palliative care  Reason for referral  Clarify Goals of Care  Date of Admission  03/07/17  Date first seen by Palliative Care  03/10/17  # of days Palliative referral response time  1 Day(s)  # of days IP prior to Palliative referral  2  Clinical Assessment  Palliative Performance Scale Score  40%  Psychosocial & Spiritual Assessment  Palliative Care Outcomes  Patient/Family meeting held?  Yes  Who was at the meeting?  patient and sister  Palliative Care Outcomes  Improved non-pain symptom therapy, Clarified goals of care      Patient Active Problem List   Diagnosis Date Noted  . Shortness of breath   . Pleural effusion, right   . Healthcare-associated pneumonia   . Palliative care encounter   .  Primary insomnia   . Sepsis ([place]) 03/08/2017  . Edema 02/23/2017  . Chronic diastolic heart failure ([place]) 01/19/2017  . Atrial fibrillation with rapid ventricular response ([place]) 01/06/2017  . Acute respiratory distress 01/06/2017  . COPD (chronic obstructive pulmonary disease) ([place]) 01/06/2017  . Hypokalemia 01/06/2017  . Elevated troponin 11/29/2016  . Acute renal insufficiency 11/29/2016  . Hyponatremia 11/29/2016  .  Mass of middle lobe of right lung 11/29/2016  . Atelectasis of right lung 11/29/2016  . Left eye complaint 07/14/2016  . Caregiver stress 07/14/2016  . Olecranon bursitis of right elbow 06/19/2016  . Myalgia and myositis 05/20/2016  . Chronic kidney disease, stage 3 01/09/2016  . Advance care planning 01/05/2016  . Spinal stenosis of lumbar region 01/05/2016  . Diabetes mellitus type 2, uncontrolled, with complications ([place]) 20/23/3435  . Hyperlipidemia 03/14/2010  . Essential hypertension 03/14/2010  . CORONARY ATHEROSLERO AUTOL VEIN BYPASS GRAFT 03/14/2010  . Peripheral arterial disease ([place]) 03/14/2010  . Chest pain 03/14/2010    Palliative Care Plan    Recommendations/Plan:  Code status changed to DNR.  Gold form on chart.  DC senna.  Patient has had 3 BMs over night  Requested the RN trial giving her a PRN oxycodone 2.5 mg as the patient was having pain and SOB.  If it is determined that the lung masses are malignant and she opts for no chemo or radiation - She is eligible for hospice services at home.  Goals of Care and Additional Recommendations:  Limitations on Scope of Treatment: Full Scope Treatment  Code Status:  DNR  Prognosis:  Unable to determine without knowing the results of the cytology and her decisions regarding treatment.  Discharge Planning:  To Be Determined  SNF for short term rehab vs home with home health and Palliative outpatient follow up.  Care plan was discussed with the patient, her daughter, and the bedside RN.  Thank you for allowing the Palliative Medicine Team to assist in the care of this patient.  Total time spent:  35 min.     Greater than 50%  of this time was spent counseling and coordinating care related to the above assessment and plan.  Imogene Burn, PA-C Palliative Medicine  Please contact Palliative MedicineTeam phone at [phone] for questions and concerns between 7 am - 7 pm.   Please see AMION for individual  provider pager numbers.

## 2017-03-13 NOTE — NC FL2 (Signed)
[place] LEVEL OF CARE SCREENING TOOL     IDENTIFICATION  Patient Name: Donna Lane Birthdate: 1925-08-11 Sex: female Admission Date (Current Location): 03/07/2017  [place] and [place] Number:  Engineering geologist and Address:  [facility], [address], [place], [place] [address]      Provider Number: [id number]  Attending Physician Name and Address:  [name], MD  Relative Name and Phone Number:       Current Level of Care: Hospital Recommended Level of Care: [place] Prior Approval Number:    Date Approved/Denied:   PASRR Number: [id number]  Discharge Plan: SNF    Current Diagnoses: Patient Active Problem List   Diagnosis Date Noted  . Shortness of breath   . Pleural effusion, right   . Healthcare-associated pneumonia   . Palliative care encounter   . Primary insomnia   . Sepsis ([place]) 03/08/2017  . Edema 02/23/2017  . Chronic diastolic heart failure ([place]) 01/19/2017  . Atrial fibrillation with rapid ventricular response ([place]) 01/06/2017  . Acute respiratory distress 01/06/2017  . COPD (chronic obstructive pulmonary disease) ([place]) 01/06/2017  . Hypokalemia 01/06/2017  . Elevated troponin 11/29/2016  . Acute renal insufficiency 11/29/2016  . Hyponatremia 11/29/2016  . Mass of middle lobe of right lung 11/29/2016  . Atelectasis of right lung 11/29/2016  . Left eye complaint 07/14/2016  . Caregiver stress 07/14/2016  . Olecranon bursitis of right elbow 06/19/2016  . Myalgia and myositis 05/20/2016  . Chronic kidney disease, stage 3 01/09/2016  . Advance care planning 01/05/2016  . Spinal stenosis of lumbar region 01/05/2016  . Diabetes mellitus type 2, uncontrolled, with complications ([place]) 25/36/6440  . Hyperlipidemia 03/14/2010  . Essential hypertension 03/14/2010  . CORONARY ATHEROSLERO AUTOL VEIN BYPASS GRAFT 03/14/2010  . Peripheral arterial disease ([place]) 03/14/2010  . Chest pain  03/14/2010    Orientation RESPIRATION BLADDER Height & Weight     Self, Time, Situation, Place  O2 (2L ) Continent Weight: 140 lb 3.2 oz (63.6 kg) Height:  '5\' 2"'$  (157.5 cm)  BEHAVIORAL SYMPTOMS/MOOD NEUROLOGICAL BOWEL NUTRITION STATUS      Continent Diet (Heart Healthy, Carb Modified, Thin Liquids)  AMBULATORY STATUS COMMUNICATION OF NEEDS Skin   Limited Assist Verbally Normal                       Personal Care Assistance Level of Assistance  Feeding, Bathing, Dressing Bathing Assistance: Limited assistance Feeding assistance: Independent Dressing Assistance: Limited assistance     Functional Limitations Info  Sight, Hearing, Speech Sight Info: Adequate Hearing Info: Adequate Speech Info: Adequate    SPECIAL CARE FACTORS FREQUENCY  PT (By licensed PT)     PT Frequency: 5              Contractures Contractures Info: Not present    Additional Factors Info  Code Status, Allergies, Insulin Sliding Scale, Isolation Precautions Code Status Info: DNR Allergies Info: Nsaids   Insulin Sliding Scale Info: 4x/day Isolation Precautions Info: Contact:  MRSA     Current Medications (03/13/2017):  This is the current hospital active medication list Current Facility-Administered Medications  Medication Dose Route Frequency Provider Last Rate Last Dose  . acetaminophen (TYLENOL) tablet 650 mg  650 mg Oral Q6H PRN Harrie Foreman, MD   650 mg at 03/09/17 2131   Or  . acetaminophen (TYLENOL) suppository 650 mg  650 mg Rectal Q6H PRN Harrie Foreman, MD      .  amiodarone (PACERONE) tablet 200 mg  200 mg Oral BID Harrie Foreman, MD   200 mg at 03/13/17 1042  . amoxicillin-clavulanate (AUGMENTIN) 500-125 MG per tablet 500 mg  1 tablet Oral Q12H [name], MD   500 mg at 03/13/17 1041  . apixaban (ELIQUIS) tablet 2.5 mg  2.5 mg Oral BID Harrie Foreman, MD   2.5 mg at 03/13/17 0815  . atorvastatin (LIPITOR) tablet 10 mg  10 mg Oral q1800 Harrie Foreman, MD   10  mg at 03/12/17 1712  . barrier cream (non-specified) 1 application  1 application Topical BID PRN Melton Alar, PA-C      . budesonide (PULMICORT) nebulizer solution 0.5 mg  0.5 mg Nebulization BID Flora Lipps, MD   0.5 mg at 03/13/17 0805  . diltiazem (CARDIZEM CD) 24 hr capsule 240 mg  240 mg Oral Daily Harrie Foreman, MD   240 mg at 03/13/17 1041  . diphenhydrAMINE (BENADRYL) capsule 25 mg  25 mg Oral QHS PRN Lance Coon, MD   25 mg at 03/10/17 0102  . docusate sodium (COLACE) capsule 100 mg  100 mg Oral BID Harrie Foreman, MD   100 mg at 03/12/17 2255  . insulin aspart (novoLOG) injection 0-5 Units  0-5 Units Subcutaneous QHS [name], MD   3 Units at 03/11/17 2058  . insulin aspart (novoLOG) injection 0-9 Units  0-9 Units Subcutaneous TID WC [name], MD   5 Units at 03/12/17 1800  . insulin detemir (LEVEMIR) injection 4 Units  4 Units Subcutaneous QHS [name], MD   4 Units at 03/12/17 2256  . ipratropium-albuterol (DUONEB) 0.5-2.5 (3) MG/3ML nebulizer solution 3 mL  3 mL Nebulization QID Wilhelmina Mcardle, MD   3 mL at 03/13/17 1145  . LORazepam (ATIVAN) tablet 0.25 mg  0.25 mg Oral QHS Melton Alar, PA-C   0.25 mg at 03/12/17 2255  . MEDLINE mouth rinse  15 mL Mouth Rinse BID [name], MD   15 mL at 03/13/17 1045  . menthol-cetylpyridinium (CEPACOL) lozenge 3 mg  1 lozenge Oral PRN Melton Alar, PA-C      . nitroGLYCERIN (NITROSTAT) SL tablet 0.4 mg  0.4 mg Sublingual Q5 min PRN Harrie Foreman, MD      . ondansetron [facility] tablet 4 mg  4 mg Oral Q6H PRN Harrie Foreman, MD       Or  . ondansetron [facility]) injection 4 mg  4 mg Intravenous Q6H PRN Harrie Foreman, MD   4 mg at 03/09/17 0242  . oxyCODONE (Oxy IR/ROXICODONE) immediate release tablet 2.5 mg  2.5 mg Oral Q3H PRN Bobby Rumpf [place], PA-C      . polyethylene glycol (MIRALAX / GLYCOLAX) packet 17 g  17 g Oral BID [name], MD   17 g at 03/12/17 2257  . sodium chloride flush (NS) 0.9 % injection 3  mL  3 mL Intravenous Q12H Harrie Foreman, MD   3 mL at 03/13/17 1045  . traZODone (DESYREL) tablet 25 mg  25 mg Oral QHS Melton Alar, PA-C   25 mg at 03/12/17 2255  . vitamin B-12 (CYANOCOBALAMIN) tablet 1,000 mcg  1,000 mcg Oral Daily Harrie Foreman, MD   1,000 mcg at 03/13/17 1041     Discharge Medications: Please see discharge summary for a list of discharge medications.  Relevant Imaging Results:  Relevant Lab Results:   Additional Information SSN:  [id number]  Darden Dates,  LCSW

## 2017-03-13 NOTE — Care Management (Signed)
Found that  patient is discarding today to [facility].  Sent message to Dr Margaretmary Eddy to add out patient palliative care to follow in facility for sx management.Referral to Cra[facility] [place]

## 2017-03-13 NOTE — Consult Note (Signed)
   [facility] CM Inpatient Consult   03/13/2017  Shekera Beavers Christner 1925-03-02 [id number]    Telephone call to inpatient RNCM. Confirmed that Mrs. Iezzi will be discharging to [facility] today 03/13/17. Will not attempt to engage for [place] Management at this time prior to discharge to SNF.    Marthenia Rolling, MSN-Ed, RN,BSN [facility] Liaison [phone]

## 2017-03-13 NOTE — Progress Notes (Signed)
New referral for Palliative to follow at [facility] received from [place]. Plan is for discharge today. Referral made aware. Thank you. Flo Shanks RN, BSN, [facility] [place], hospital liaison [phone] c

## 2017-03-13 NOTE — Progress Notes (Signed)
Inpatient Diabetes Program Recommendations  AACE/ADA: New Consensus Statement on Inpatient Glycemic Control (2015)  Target Ranges:  Prepandial:   less than 140 mg/dL      [facility] postprandial:   less than 180 mg/dL (1-2 hours)      Critically ill patients:  140 - 180 mg/dL   Results for JANARA, KLETT (MRN [id number]) as of 03/13/2017 08:54  Ref. Range 03/12/2017 07:52 03/12/2017 11:50 03/12/2017 16:54 03/12/2017 20:26 03/12/2017 22:27 03/13/2017 08:05  Glucose-Capillary Latest Ref Range: 65 - 99 mg/dL 100 (H) 171 (H) 251 (H) 226 (H) 192 (H) 98  Results for VILLA, BURGIN (MRN [id number]) as of 03/13/2017 08:54  Ref. Range 03/11/2017 07:54 03/11/2017 13:26 03/11/2017 17:20 03/11/2017 20:14  Glucose-Capillary Latest Ref Range: 65 - 99 mg/dL 208 (H) 241 (H) 308 (H) 286 (H)   Review of Glycemic Control  Diabetes history:DM2 Outpatient Diabetes medications: Levemir 3-5 units QHS PRN, Glipizide XL 5 mg QAM Current orders for Inpatient glycemic control: Levemir 4 units QHS, Novolog 0-9 units TID with meals, Novolog 0-5 units QHS   Inpatient Diabetes Program Recommendations:  Insulin - Meal Coverage: If patient is eating at least 50% of meals, please consider ordering Novolog 3 units TID with meals  Thanks, Barnie Alderman, RN, MSN, CDE Diabetes Coordinator Inpatient Diabetes Program [phone] (Team Pager from 8am to 5pm)

## 2017-03-13 NOTE — Clinical Social Work Note (Signed)
CSW consulted for New SNF placement. CSW met with pt to address consult and spoke with pt's daughter. CSW initiated SNF search as recommended by PT. Pt is in agreement with SNF for STR. CSW provided bed offers. Pt and daughter chose [facility]. CSW updated facility and they are able to accept pt today. Facility has received discharge information. RN will call report. [facility] will provide transportation. CSW is signing off as no further needs identified.   Darden Dates, MSW, LCSW  Clinical Social Worker  [phone]

## 2017-03-13 NOTE — Discharge Instructions (Signed)
Follow-up with primary care physician at the facility in 3-4 days, palliative care to follow-up with the patient at rehabilitation Center Follow-up with her oncologist Dr. Mike Gip in 1 week Continue oxygen via nasal cannula

## 2017-03-13 NOTE — Care Management (Signed)
Discussed partial code status with palliative care and the likelihood patiet does have underlying malignancy.  Discussed family's concern regarding patient's weakness and the need for improved functional status inorder for her to return home.  Palliative a awaiting arrival of daughter to discuss goals of treatment and dnr status who has since arrived on unit.  Patient and daughter are feeling more strongly towards a short term stay at a skilled nursing facility prior to discharge home with home health and palliative care

## 2017-03-13 NOTE — Clinical Social Work Placement (Signed)
   CLINICAL SOCIAL WORK PLACEMENT  NOTE  Date:  03/13/2017  Patient Details  Name: Donna Lane MRN: [id number] Date of Birth: Mar 24, 1925  Clinical Social Work is seeking post-discharge placement for this patient at the [place] level of care (*CSW will initial, date and re-position this form in  chart as items are completed):  Yes   Patient/family provided with [facility] Work Department's list of facilities offering this level of care within the geographic area requested by the patient (or if unable, by the patient's family).  Yes   Patient/family informed of their freedom to choose among providers that offer the needed level of care, that participate in Medicare, Medicaid or managed care program needed by the patient, have an available bed and are willing to accept the patient.  Yes   Patient/family informed of [facility]'s ownership interest in [facility] and [facility], as well as of the fact that they are under no obligation to receive care at these facilities.  PASRR submitted to EDS on 03/13/17     PASRR number received on 03/13/17     Existing PASRR number confirmed on       FL2 transmitted to all facilities in geographic area requested by pt/family on 03/13/17     FL2 transmitted to all facilities within larger geographic area on       Patient informed that his/her managed care company has contracts with or will negotiate with certain facilities, including the following:        Yes   Patient/family informed of bed offers received.  Patient chooses bed at [facility]     Physician recommends and patient chooses bed at      Patient to be transferred to [facility] on 03/13/17.  Patient to be transferred to facility by [facility]     Patient family notified on 03/13/17 of transfer.  Name of family member notified:  Pt's daughter [name]     PHYSICIAN       Additional Comment:     _______________________________________________ Darden Dates, LCSW 03/13/2017, 5:03 PM

## 2017-03-13 NOTE — Progress Notes (Signed)
PHARMACY NOTE:  ANTIMICROBIAL RENAL DOSAGE ADJUSTMENT  Current antimicrobial regimen includes a mismatch between antimicrobial dosage and estimated renal function.  As per policy approved by the Pharmacy & Therapeutics and Medical Executive Committees, the antimicrobial dosage will be adjusted accordingly.  Current antimicrobial dosage:  Augmentin 875 mg q12h  Indication: PNA  Renal Function: Estimated Creatinine Clearance: 26.9 mL/min (A) (by C-G formula based on SCr of 1.24 mg/dL (H)).     Antimicrobial dosage has been changed to:   Augmentin 500 mg q12h for renal fxn    Thank you for allowing pharmacy to be a part of this patient's care.  [place], [facility] 03/13/2017 10:03 AM

## 2017-03-13 NOTE — Discharge Summary (Addendum)
[facility] [place] NAME: Donna Lane    MR#:  [id number]  DATE OF BIRTH:  09/30/1925  DATE OF ADMISSION:  03/07/2017 ADMITTING PHYSICIAN: Harrie Foreman, MD  DATE OF DISCHARGE:  03/13/17 PRIMARY CARE PHYSICIAN: [name], MD    ADMISSION DIAGNOSIS:  Pleural effusion associated with pulmonary infection [J18.9, J91.8] Peripheral edema [R60.9] Healthcare-associated pneumonia [J18.9]  DISCHARGE DIAGNOSIS:  Active Problems:   Sepsis ([place])   Healthcare-associated pneumonia   Palliative care encounter   Primary insomnia   Pleural effusion, right   Shortness of breath   SECONDARY DIAGNOSIS:   Past Medical History:  Diagnosis Date  . Arrhythmia    atrial fibrillation  . Chronic diastolic heart failure ([place])   . CKD (chronic kidney disease), stage III   . COPD (chronic obstructive pulmonary disease) ([place])   . Coronary artery disease 2008   CABG x 4,   . Diabetes mellitus   . Diverticulosis    on colonoscopy 08/12/2006  . Hyperlipidemia   . Hypertension   . Melanoma ([place])    removed by derm   . PVD (peripheral vascular disease) ([place])   . Spinal stenosis     HOSPITAL COURSE:   HPI: The patient with past medical history of atrial fibrillation, coronary artery disease status post CABG as well as diabetes presents emergency department complaining of lower extremity edema. The patient underwent cardioversion this morning for uncontrolled atrial fibrillation. She is felt very fatigued since the procedure earlier today but complains most about swelling of her feet bilaterally. She denies chest pain or shortness of breath. Chest x-ray showed right lower lobe infiltrate. Review of the patient's chart reveals a known right bronchogenic mass. The patient has seen pulmonology as an outpatient but deferred treatment. Laboratory evaluation today significant for hyponatremia. Due to the aforementioned reasons emergency department  staff called the hospitalist service for admission.  Hospital course  #  Sepsis: The patient meets criteria via leukocytosis and tachypnea. Secondary to healthcare associated pneumonia, Clinically improving  blood cultures are negative so far. MRSA PCR is negative   ContinueTapering steroids and change IV Zosyn to by mouth Augmentin  # Pneumonia: Healthcare associated; post-obstructive.  Clinically improved with Zosyn. Discontinue vancomycin as MRSA PCR is negative. Changed to by mouth Augmentin, continue for 3 more days for a total of 10 days Oxygen as needed and wean off as tolerated  patient had ultrasound-guided thoracocentesis which is both therapeutic and diagnostic, cultures are negative so far , follow-up thoracocentesis labs at Dr. Mike Gip as an outpatient  # Lung mass: Status post bronchoscopy and pulmonology consultation as an outpatient, patient was seen by Dr. Mortimer Fries before and  during the hospital course refused invasive procedures including bronchoscopy, Patient refused any further aggressive workup for right lung mass but agreeable with right thoracentesis,  Patient had right-sided thoracentesis 03/11/2017 tolerated procedure well , fluid was sent for thoracentesis labs including culture , culture negative so far  palliative care followed patient during the hospital course Follow-up with oncology as an OP appreciate their recommendations  #Left upper extremity edema with weeping fluid-third spacing Ultrasound is negative for DVT Clinically better  Encouraged patient to keep her left upper extremity elevated and compression dressing Follow-up with wound care  #Hyponatremia could be from SIADH from the lung mass Sodium at 129--127 --129 Provided gentle hydration with IV fluids and check BMP in a.m.  # Diabetes mellitus type 2: Continue basal insulin as well as  sliding scale insulin hospitalized  # Atrial fibrillation: Status post cardiovesion 03/07/17  Currently  rate controlled.  Continue Eliquis, diltiazem and amiodarone as patient is refusing any kind of procedures at this time     #. Hyperlipidemia: Continue statin therapy  #. CKD: Stage III. Avoid nephrotoxic agents   8. DVT prophylaxis: Full dose anticoagulation eliquis 9. GI prophylaxis: None  Follow-up with palliative care-,PT is recommending skilled nursing facility  DISCHARGE CONDITIONS:   [place]  CONSULTS OBTAINED:  Treatment Team:  Lequita Asal, MD   PROCEDURES Right-sided thoracentesis  DRUG ALLERGIES:   Allergies  Allergen Reactions  . Nsaids Other (See Comments)    Would avoid due to CKD.... Pt states she not allergic    DISCHARGE MEDICATIONS:   Current Discharge Medication List    START taking these medications   Details  amoxicillin-clavulanate (AUGMENTIN) 500-125 MG tablet Take 1 tablet (500 mg total) by mouth every 12 (twelve) hours. Qty: 10 tablet, Refills: 0    barrier cream (NON-SPECIFIED) CREA Apply 1 application topically 2 (two) times daily as needed. Qty: 30 each, Refills: 0    diphenhydrAMINE (BENADRYL) 25 mg capsule Take 1 capsule (25 mg total) by mouth at bedtime as needed for sleep. Qty: 30 capsule, Refills: 0    docusate sodium (COLACE) 100 MG capsule Take 1 capsule (100 mg total) by mouth 2 (two) times daily as needed for mild constipation. Qty: 10 capsule, Refills: 0    ipratropium-albuterol (DUONEB) 0.5-2.5 (3) MG/3ML SOLN Take 3 mLs by nebulization 4 (four) times daily. Qty: 360 mL    menthol-cetylpyridinium (CEPACOL) 3 MG lozenge Take 1 lozenge (3 mg total) by mouth as needed (dry mouth). Qty: 100 tablet, Refills: 12    ondansetron (ZOFRAN) 4 MG tablet Take 1 tablet (4 mg total) by mouth every 6 (six) hours as needed for nausea. Qty: 20 tablet, Refills: 0    oxyCODONE (OXY IR/ROXICODONE) 5 MG immediate release tablet Take 0.5 tablets (2.5 mg total) by mouth every 4 (four) hours as needed for moderate pain (or  SOB). Qty: 30 tablet, Refills: 0    traZODone (DESYREL) 50 MG tablet Take 0.5 tablets (25 mg total) by mouth at bedtime. Qty: 15 tablet, Refills: 0      CONTINUE these medications which have CHANGED   Details  insulin detemir (LEVEMIR) 100 UNIT/ML injection Inject 0.04 mLs (4 Units total) into the skin at bedtime. Qty: 10 mL, Refills: 11      CONTINUE these medications which have NOT CHANGED   Details  acetaminophen (TYLENOL) 500 MG tablet Take 500 mg by mouth 2 (two) times daily.    amiodarone (PACERONE) 200 MG tablet Take 1 tablet (200 mg total) by mouth 2 (two) times daily. Qty: 70 tablet, Refills: 3    apixaban (ELIQUIS) 2.5 MG TABS tablet Take 1 tablet (2.5 mg total) by mouth 2 (two) times daily. Qty: 180 tablet, Refills: 3    atorvastatin (LIPITOR) 10 MG tablet Take 1 tablet (10 mg total) by mouth daily at 6 PM. Qty: 30 tablet, Refills: 6    cyanocobalamin 1000 MCG tablet Take 1,000 mcg by mouth daily.    diltiazem (CARDIZEM CD) 240 MG 24 hr capsule Take 1 capsule (240 mg total) by mouth daily. Qty: 30 capsule, Refills: 6    nitroGLYCERIN (NITROSTAT) 0.4 MG SL tablet Place 1 tablet (0.4 mg total) under the tongue every 5 (five) minutes as needed for chest pain. Qty: 25 tablet, Refills: 6  STOP taking these medications     glipiZIDE (GLUCOTROL XL) 5 MG 24 hr tablet          DISCHARGE INSTRUCTIONS:   Follow-up with primary care physician at the facility in 3-4 days,Palliative care to follow-up with the patient at rehabilitation Center Follow-up with her oncologist Dr. Mike Gip in 1 week   DIET:  Diabetic diet  DISCHARGE CONDITION:  Stable  ACTIVITY:  Activity as tolerated  OXYGEN:  Home Oxygen: Yes.     Oxygen Delivery: 3  liters/min via Patient connected to nasal cannula oxygen  DISCHARGE LOCATION:  nursing home   If you experience worsening of your admission symptoms, develop shortness of breath, life threatening emergency, suicidal or  homicidal thoughts you must seek medical attention immediately by calling 911 or calling your MD immediately  if symptoms less severe.  You Must read complete instructions/literature along with all the possible adverse reactions/side effects for all the Medicines you take and that have been prescribed to you. Take any new Medicines after you have completely understood and accpet all the possible adverse reactions/side effects.   Please note  You were cared for by a hospitalist during your hospital stay. If you have any questions about your discharge medications or the care you received while you were in the hospital after you are discharged, you can call the unit and asked to speak with the hospitalist on call if the hospitalist that took care of you is not available. Once you are discharged, your primary care physician will handle any further medical issues. Please note that NO REFILLS for any discharge medications will be authorized once you are discharged, as it is imperative that you return to your primary care physician (or establish a relationship with a primary care physician if you do not have one) for your aftercare needs so that they can reassess your need for medications and monitor your lab values.     Today  Chief Complaint  Patient presents with  . Edema   Patient is feeling better, but was going to rehabilitation center  ROS:  CONSTITUTIONAL: Denies fevers, chills. Denies any fatigue, weakness.  EYES: Denies blurry vision, double vision, eye pain. EARS, NOSE, THROAT: Denies tinnitus, ear pain, hearing loss. RESPIRATORY: Denies cough, wheeze, shortness of breath.  CARDIOVASCULAR: Denies chest pain, palpitations, edema.  GASTROINTESTINAL: Denies nausea, vomiting, diarrhea, abdominal pain. Denies bright red blood per rectum. GENITOURINARY: Denies dysuria, hematuria. ENDOCRINE: Denies nocturia or thyroid problems. HEMATOLOGIC AND LYMPHATIC: Denies easy bruising or  bleeding. SKIN: Denies rash or lesion. MUSCULOSKELETAL: Left hand swelling is improving Denies pain in neck, back, shoulder, knees, hips or arthritic symptoms.  NEUROLOGIC: Denies paralysis, paresthesias.  PSYCHIATRIC: Denies anxiety or depressive symptoms.   VITAL SIGNS:  Blood pressure (!) 126/45, pulse (!) 101, temperature 97.6 F (36.4 C), temperature source Oral, resp. rate 18, height '5\' 2"'$  (1.575 m), weight 63.6 kg (140 lb 3.2 oz), SpO2 98 %.  I/O:    Intake/Output Summary (Last 24 hours) at 03/13/17 1410 Last data filed at 03/13/17 0211  Gross per 24 hour  Intake            447.5 ml  Output                0 ml  Net            447.5 ml    PHYSICAL EXAMINATION:  GENERAL:  83 y.o.-year-old patient lying in the bed with no acute distress.  EYES: Pupils equal, round,  reactive to light and accommodation. No scleral icterus. Extraocular muscles intact.  HEENT: Head atraumatic, normocephalic. Oropharynx and nasopharynx clear.  NECK:  Supple, no jugular venous distention. No thyroid enlargement, no tenderness.  LUNGS: Normal breath sounds bilaterally, no wheezing, rales,rhonchi or crepitation. No use of accessory muscles of respiration.  CARDIOVASCULAR: S1, S2 normal. No murmurs, rubs, or gallops.  ABDOMEN: Soft, non-tender, non-distended. Bowel sounds present. No organomegaly or mass.  EXTREMITIES: 1+  pedal edema, cyanosis, or clubbing. Left upper extremity edema is improving NEUROLOGIC: Cranial nerves II through XII are intact. Muscle strength 5/5 in all extremities. Sensation intact. Gait not checked.  PSYCHIATRIC: The patient is alert and oriented x 3.  SKIN: No obvious rash, lesion, or ulcer.   DATA REVIEW:   CBC  Recent Labs Lab 03/13/17 0545  WBC 28.4*  HGB 11.8*  HCT 34.7*  PLT 216    Chemistries   Recent Labs Lab 03/07/17 2238  03/13/17 0545  NA 127*  < > 129*  K 3.2*  < > 4.8  CL 87*  < > 97*  CO2 29  < > 24  GLUCOSE 243*  < > 98  BUN 35*  < > 29*   CREATININE 1.31*  < > 1.24*  CALCIUM 9.0  < > 8.7*  AST 33  --   --   ALT 23  --   --   ALKPHOS 106  --   --   BILITOT 0.8  --   --   < > = values in this interval not displayed.  Cardiac Enzymes  Recent Labs Lab 03/07/17 2238  TROPONINI <0.03    Microbiology Results  Results for orders placed or performed during the hospital encounter of 03/07/17  Blood culture (routine x 2)     Status: None   Collection Time: 03/08/17 12:13 AM  Result Value Ref Range Status   Specimen Description BLOOD LEFT ANTECUBITAL  Final   Special Requests   Final    BOTTLES DRAWN AEROBIC AND ANAEROBIC Blood Culture adequate volume   Culture NO GROWTH 5 DAYS  Final   Report Status 03/13/2017 FINAL  Final  Blood culture (routine x 2)     Status: None   Collection Time: 03/08/17 12:33 AM  Result Value Ref Range Status   Specimen Description BLOOD LEFT ANTECUBITAL  Final   Special Requests   Final    BOTTLES DRAWN AEROBIC AND ANAEROBIC Blood Culture adequate volume   Culture NO GROWTH 5 DAYS  Final   Report Status 03/13/2017 FINAL  Final  MRSA PCR Screening     Status: Abnormal   Collection Time: 03/08/17  2:54 AM  Result Value Ref Range Status   MRSA by PCR POSITIVE (A) NEGATIVE Final    Comment:        The GeneXpert MRSA Assay (FDA approved for NASAL specimens only), is one component of a comprehensive MRSA colonization surveillance program. It is not intended to diagnose MRSA infection nor to guide or monitor treatment for MRSA infections. RESULT CALLED TO, READ BACK BY AND VERIFIED WITH: ERWIN MACROHON AT 0426 ON 03/08/17 [place].   Body fluid culture     Status: None (Preliminary result)   Collection Time: 03/11/17 10:40 AM  Result Value Ref Range Status   Specimen Description PLEURAL  Final   Special Requests NONE  Final   Gram Stain   Final    FEW WBC PRESENT, PREDOMINANTLY MONONUCLEAR NO ORGANISMS SEEN    Culture   Final  NO GROWTH 2 DAYS Performed at [facility],  [place] [address], [place], [place] [address]    Report Status PENDING  Incomplete    RADIOLOGY:  Dg Chest 1 View  Result Date: 03/11/2017 CLINICAL DATA:  Status post right thoracentesis. EXAM: CHEST 1 VIEW COMPARISON:  Radiographs of March 07, 2017. FINDINGS: Stable cardiomediastinal silhouette. Atherosclerosis of thoracic aorta is noted. Status post coronary artery bypass graft. No pneumothorax is noted. Mild left pleural effusion is noted. Right pleural effusion is noted which is improved compared to prior exam. Bony thorax is unremarkable. IMPRESSION: No pneumothorax status post right-sided thoracentesis. Electronically Signed   By: Marijo Conception, M.D.   On: 03/11/2017 11:40   US Venous Img Upper Uni Left  Result Date: 03/10/2017 CLINICAL DATA:  Left hand swelling EXAM: LEFT UPPER EXTREMITY VENOUS DOPPLER ULTRASOUND TECHNIQUE: Gray-scale sonography with graded compression, as well as color Doppler and duplex ultrasound were performed to evaluate the upper extremity deep venous system from the level of the subclavian vein and including the jugular, axillary, basilic, radial, ulnar and upper cephalic vein. Spectral Doppler was utilized to evaluate flow at rest and with distal augmentation maneuvers. COMPARISON:  None. FINDINGS: Contralateral Subclavian Vein: Respiratory phasicity is normal and symmetric with the symptomatic side. No evidence of thrombus. Normal compressibility. Internal Jugular Vein: No evidence of thrombus. Normal compressibility, respiratory phasicity and response to augmentation. Subclavian Vein: No evidence of thrombus. Normal compressibility, respiratory phasicity and response to augmentation. Axillary Vein: No evidence of thrombus. Normal compressibility, respiratory phasicity and response to augmentation. Cephalic Vein: No evidence of thrombus. Normal compressibility, respiratory phasicity and response to augmentation. Basilic Vein: No evidence of thrombus. Normal compressibility,  respiratory phasicity and response to augmentation. Brachial Veins: No evidence of thrombus. Normal compressibility, respiratory phasicity and response to augmentation. Radial Veins: No evidence of thrombus. Normal compressibility, respiratory phasicity and response to augmentation. Ulnar Veins: No evidence of thrombus. Normal compressibility, respiratory phasicity and response to augmentation. Venous Reflux:  None visualized. Other Findings:  None visualized. IMPRESSION: No evidence of deep venous thrombosis. Electronically Signed   By: Julian Hy M.D.   On: 03/10/2017 16:33   US Thoracentesis Asp Pleural Space W/img Guide  Result Date: 03/11/2017 INDICATION: Right pleural effusion. EXAM: ULTRASOUND GUIDED right THORACENTESIS MEDICATIONS: None. COMPLICATIONS: None immediate. PROCEDURE: An ultrasound guided thoracentesis was thoroughly discussed with the patient and questions answered. The benefits, risks, alternatives and complications were also discussed. The patient understands and wishes to proceed with the procedure. Written consent was obtained. Ultrasound was performed to localize and mark an adequate pocket of fluid in the right chest. The area was then prepped and draped in the normal sterile fashion. 1% Lidocaine was used for local anesthesia. Under ultrasound guidance a 8 Fr Safe-T-Centesis catheter was introduced. Thoracentesis was performed. The catheter was removed and a dressing applied. FINDINGS: A total of approximately 1500 mL of serous fluid was removed. Samples were sent to the laboratory as requested by the clinical team. IMPRESSION: Successful ultrasound guided right thoracentesis yielding 1.5 L of pleural fluid. Electronically Signed   By: Marijo Conception, M.D.   On: 03/11/2017 11:41    EKG:   Orders placed or performed during the hospital encounter of 03/07/17  . EKG 12-Lead  . EKG 12-Lead  . ED EKG  . ED EKG  . EKG 12-Lead  . EKG 12-Lead      Management plans  discussed with the patient, family and they are in agreement.  CODE STATUS:     Code Status Orders        Start     Ordered   03/13/17 0944  Do not attempt resuscitation (DNR)  Continuous    Question Answer Comment  In the event of cardiac or respiratory ARREST Do not call a "code blue"   In the event of cardiac or respiratory ARREST Do not perform Intubation, CPR, defibrillation or ACLS   In the event of cardiac or respiratory ARREST Use medication by any route, position, wound care, and other measures to relive pain and suffering. May use oxygen, suction and manual treatment of airway obstruction as needed for comfort.      03/13/17 0943    Code Status History    Date Active Date Inactive Code Status Order ID Comments User Context   03/08/2017  2:15 AM 03/13/2017  9:43 AM Partial Code [id number]  Harrie Foreman, MD ED   01/07/2017  7:26 PM 01/09/2017  5:42 PM Partial Code [id number]  Vaughan Basta, MD Inpatient   01/06/2017  3:21 PM 01/07/2017  7:26 PM Full Code [id number]  Epifanio Lesches, MD ED   11/28/2016  7:40 PM 11/29/2016  3:44 PM Full Code [id number]  Vaughan Basta, MD Inpatient    Advance Directive Documentation     Most Recent Value  Type of Advance Directive  Living will  Pre-existing out of facility DNR order (yellow form or pink MOST form)  -  "MOST" Form in Place?  -      TOTAL TIME TAKING CARE OF THIS PATIENT: 45 minutes.   Note: This dictation was prepared with Dragon dictation along with smaller phrase technology. Any transcriptional errors that result from this process are unintentional.   '@MEC'$ @  on 03/13/2017 at 2:10 PM  Between 7am to 6pm - Pager - [phone]  After 6pm go to [email or web address] - password [email or web address] [facility]  Office  [phone]  CC: Primary care physician; [name], MD

## 2017-03-13 NOTE — Consult Note (Signed)
   [facility] CM Inpatient Consult   03/13/2017  Donna Lane 1925-09-13 [id number]    [facility] Care Management referral received. Chart reviewed. Left voicemail for inpatient RNCM to discuss referral. Wanted to confirm discharge plan prior to attempting to engage family about potential [facility] Care Management services. Noted palliative medicine following and also noted potential SNF placement. Will await response from inpatient RNCM.   Marthenia Rolling, MSN-Ed, RN,BSN [facility] Liaison [phone]

## 2017-03-14 LAB — BODY FLUID CULTURE: CULTURE: NO GROWTH

## 2017-03-14 NOTE — Care Management (Signed)
For long range plan of needs when discharges home, and prevent delay in continuum of care,  have contacted agency preference- [facility] to be on stand by for home health services.  Patient will need her services initiated very quickly when discharges from skilled nursing facility.  [facility] will contact SNF and follow.

## 2017-03-16 DIAGNOSIS — E785 Hyperlipidemia, unspecified: Secondary | ICD-10-CM | POA: Diagnosis not present

## 2017-03-16 DIAGNOSIS — J449 Chronic obstructive pulmonary disease, unspecified: Secondary | ICD-10-CM | POA: Diagnosis not present

## 2017-03-16 DIAGNOSIS — I4891 Unspecified atrial fibrillation: Secondary | ICD-10-CM | POA: Diagnosis not present

## 2017-03-16 DIAGNOSIS — R609 Edema, unspecified: Secondary | ICD-10-CM | POA: Diagnosis not present

## 2017-03-16 DIAGNOSIS — J9600 Acute respiratory failure, unspecified whether with hypoxia or hypercapnia: Secondary | ICD-10-CM | POA: Diagnosis not present

## 2017-03-16 DIAGNOSIS — E119 Type 2 diabetes mellitus without complications: Secondary | ICD-10-CM | POA: Diagnosis not present

## 2017-03-16 DIAGNOSIS — M6281 Muscle weakness (generalized): Secondary | ICD-10-CM | POA: Diagnosis not present

## 2017-03-16 DIAGNOSIS — J189 Pneumonia, unspecified organism: Secondary | ICD-10-CM | POA: Diagnosis not present

## 2017-03-16 LAB — CHOLESTEROL, BODY FLUID: CHOL FL: 31 mg/dL

## 2017-03-16 NOTE — Progress Notes (Deleted)
Cardiology Office Note  Date:  03/16/2017   ID:  Donna Lane, DOB [birth year], MRN [id number]  PCP:  [name], MD   No chief complaint on file.   HPI:  Ms. Passey is a pleasant 83 year old woman with history of  coronary artery disease,  catheterization August 25, 2007 showing severe three-vessel disease, with bypass surgery at [facility] September 2008 also with severe peripheral vascular disease, bilateral renal artery stenosis,  carotid disease,  iliac disease  Recent dx of lung CA, Lung mass on CT scan, 4.6 cm on the right Persistent atrial fibrillation with recent cardioversion 02/2017 who presents for follow-up of her coronary artery disease.Recent hospitalization for shortness of breath and atrial fibrillation with RVR   on 02/14/2017 Started on amiodarone for pharmacological cardioversion eliquis started 01/10/16 for persistent atrial fibrillation Rate control with diltiazem Cardioversion performed for atrial fibrillation  Admitted to the hospital 3/24 with LE edema CXR with infiltrate RLL, pleural effusion, PNA Hyponatremia, possible SIADH Treated with ABX and steroids Had thoracentesis   significant stress at home taking care of her husband who has severe health issues, persistent dysphagia, she gives him food by feeding tube  EKG reviewed person by myself on today's visit showsNSR  Other past medical history Several times has woken up at night, possibly 6 times over the past several weeks with a "sensation" in her left chest. Does not last very long, less than a minute, then goes away. Denies any chest pain. She is unclear If it is palpitations  arthritis pill was held for creatinine more than 2.  Also reports having spinal stenosis, seen by Dr. Trenton Gammon, surgery was not indicated, she had cortisone shots 2, second shot affected a nerve.  previous episodes of chest pain resolved by decreasing the dose of her aspirin from 325 mg daily to  81 mg daily. She currently takes 81 mg 2 Prior pain in the subxiphoid region with occasional nausea and vomiting of green bilious liquid. She attributes her discomfort to taking meloxicam which was initiated for possible gout.   hospital admission 04/17/2014 to 04/19/2014 with COPD exacerbation, acute on chronic diastolic CHF She was given IV Lasix with worsening renal dysfunction also given IV steroids, nebulizers with good improvement of her shortness of breath Baseline creatinine before diuresis was 1.25. After diuresis was 1.96  Carotid ultrasound in the November 2012 has shown 40-59% bilateral carotid disease.  LE u/s 11/2011 suggest 50% distal aorta disease, right common iliac artery with equal or less than 50% disease, left common iliac with equal or greater than 60% disease. no severe renal artery stenosis  PMH:   has a past medical history of Arrhythmia; Chronic diastolic heart failure ([place]); CKD (chronic kidney disease), stage III; COPD (chronic obstructive pulmonary disease) ([place]); Coronary artery disease (2008); Diabetes mellitus; Diverticulosis; Hyperlipidemia; Hypertension; Melanoma ([place]); PVD (peripheral vascular disease) ([place]); and Spinal stenosis.  PSH:    Past Surgical History:  Procedure Laterality Date  . CARDIAC CATHETERIZATION     PTCA x 3  . CARDIOVERSION N/A 03/07/2017   Procedure: Cardioversion;  Surgeon: Minna Merritts, MD;  Location: [facility] ORS;  Service: Cardiovascular;  Laterality: N/A;  . CATARACT EXTRACTION    . CORONARY ARTERY BYPASS GRAFT  2008   x 4  . HIP FRACTURE SURGERY Left    pins placed w/o hip replacement, hardware removed later  . LUMBAR DISC SURGERY     in her 73s  . tonsillectomy    . VAGINAL DELIVERY  x2    Current Outpatient Prescriptions  Medication Sig Dispense Refill  . acetaminophen (TYLENOL) 500 MG tablet Take 500 mg by mouth 2 (two) times daily.    Marland Kitchen amiodarone (PACERONE) 200 MG tablet Take 1 tablet (200 mg total) by  mouth 2 (two) times daily. 70 tablet 3  . amoxicillin-clavulanate (AUGMENTIN) 500-125 MG tablet Take 1 tablet (500 mg total) by mouth every 12 (twelve) hours. 10 tablet 0  . apixaban (ELIQUIS) 2.5 MG TABS tablet Take 1 tablet (2.5 mg total) by mouth 2 (two) times daily. 180 tablet 3  . atorvastatin (LIPITOR) 10 MG tablet Take 1 tablet (10 mg total) by mouth daily at 6 PM. 30 tablet 6  . barrier cream (NON-SPECIFIED) CREA Apply 1 application topically 2 (two) times daily as needed. 30 each 0  . cyanocobalamin 1000 MCG tablet Take 1,000 mcg by mouth daily.    Marland Kitchen diltiazem (CARDIZEM CD) 240 MG 24 hr capsule Take 1 capsule (240 mg total) by mouth daily. 30 capsule 6  . diphenhydrAMINE (BENADRYL) 25 mg capsule Take 1 capsule (25 mg total) by mouth at bedtime as needed for sleep. 30 capsule 0  . docusate sodium (COLACE) 100 MG capsule Take 1 capsule (100 mg total) by mouth 2 (two) times daily as needed for mild constipation. 10 capsule 0  . insulin detemir (LEVEMIR) 100 UNIT/ML injection Inject 0.04 mLs (4 Units total) into the skin at bedtime. 10 mL 11  . ipratropium-albuterol (DUONEB) 0.5-2.5 (3) MG/3ML SOLN Take 3 mLs by nebulization 4 (four) times daily. 360 mL   . menthol-cetylpyridinium (CEPACOL) 3 MG lozenge Take 1 lozenge (3 mg total) by mouth as needed (dry mouth). 100 tablet 12  . nitroGLYCERIN (NITROSTAT) 0.4 MG SL tablet Place 1 tablet (0.4 mg total) under the tongue every 5 (five) minutes as needed for chest pain. 25 tablet 6  . ondansetron (ZOFRAN) 4 MG tablet Take 1 tablet (4 mg total) by mouth every 6 (six) hours as needed for nausea. 20 tablet 0  . oxyCODONE (OXY IR/ROXICODONE) 5 MG immediate release tablet Take 0.5 tablets (2.5 mg total) by mouth every 4 (four) hours as needed for moderate pain (or SOB). 30 tablet 0  . traZODone (DESYREL) 50 MG tablet Take 0.5 tablets (25 mg total) by mouth at bedtime. 15 tablet 0   No current facility-administered medications for this visit.       Allergies:   Nsaids   Social History:  The patient  reports that she quit smoking about 9 years ago. Her smoking use included Cigarettes. She has a 50.00 pack-year smoking history. She has never used smokeless tobacco. She reports that she does not drink alcohol or use drugs.   Family History:   family history includes Diabetes in her sister; Heart disease in her father and sister.    Review of Systems: Review of Systems  Constitutional: Positive for malaise/fatigue.  Respiratory: Negative.   Cardiovascular: Positive for palpitations.  Gastrointestinal: Negative.   Musculoskeletal: Negative.        Leg weakness  Neurological: Positive for weakness.  Psychiatric/Behavioral: Negative.   All other systems reviewed and are negative.    PHYSICAL EXAM: VS:  There were no vitals taken for this visit. , BMI There is no height or weight on file to calculate BMI. GEN: Thin,  in no acute distress, she presents today in a wheelchair HEENT: normal  Neck: no JVD, carotid bruits, or masses Cardiac: Irregularly irregular, no murmurs, rubs, or gallops,no edema  Respiratory:  clear to auscultation bilaterally, normal work of breathing GI: soft, nontender, nondistended, + BS MS: no deformity or atrophy  Skin: warm and dry, no rash Neuro:  Strength and sensation are intact Psych: euthymic mood, full affect    Recent Labs: 01/06/2017: Magnesium 2.0 03/07/2017: ALT 23; B Natriuretic Peptide 182.0; TSH 5.730 03/13/2017: BUN 29; Creatinine, Ser 1.24; Hemoglobin 11.8; Platelets 216; Potassium 4.8; Sodium 129    Lipid Panel Lab Results  Component Value Date   CHOL 94 01/07/2017   HDL 33 (L) 01/07/2017   LDLCALC 48 01/07/2017   TRIG 67 01/07/2017      Wt Readings from Last 3 Encounters:  03/13/17 140 lb 3.2 oz (63.6 kg)  03/07/17 123 lb (55.8 kg)  03/03/17 123 lb (55.8 kg)       ASSESSMENT AND PLAN:  Chronic diastolic heart failure (HCC) - Plan: EKG 12-Lead Trace ankle  swelling, no changes to her medications  Mixed hyperlipidemia - Plan: EKG 12-Lead Currently on Lipitor  Essential hypertension - Plan: EKG 12-Lead Blood pressure is well controlled on today's visit. No changes made to the medications.  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 No further workup at this time. Continue current medication regimen.  Peripheral arterial disease ([place]) - Plan: EKG 12-Lead  Atrial fibrillation with rapid ventricular response ([place]) - Plan: EKG 12-Lead We will schedule her for cardioversion March 23 She does not need repeat lab work in preparation Recommended that she stay on her anticoagulation, take her amiodarone, eliquis  and diltiazem the morning of the procedure No food morning of the procedure  Uncontrolled type 2 diabetes mellitus with complication, with long-term current use of insulin ([place]) - Plan: EKG 12-Lead Recommended she liberalize her diet as she is not eating well  Chronic kidney disease, stage 3 - Plan: EKG 12-Lead  Lung mass - Plan: EKG 12-Lead  Centrilobular emphysema ([place]) - Plan: EKG 12-Lead Long prior smoking history  Edema, unspecified type - Plan: EKG 12-Lead Edema has improved, minimal swelling around her ankles Unable to exclude poor nutrition as a contributor to her symptoms We'll continue Lasix daily   Total encounter time more than 25 minutes  Greater than 50% was spent in counseling and coordination of care with the patient   Disposition:   F/U  6 months   No orders of the defined types were placed in this encounter.    Signed, Esmond Plants, M.D., Ph.D. 03/16/2017  [place]

## 2017-03-18 ENCOUNTER — Ambulatory Visit: Payer: Medicare Other | Admitting: Cardiovascular Disease

## 2017-03-20 ENCOUNTER — Other Ambulatory Visit: Payer: Self-pay | Admitting: *Deleted

## 2017-03-20 ENCOUNTER — Ambulatory Visit: Payer: Medicare Other | Admitting: Family Medicine

## 2017-03-20 NOTE — Patient Outreach (Signed)
[facility], [place]) Care Management  03/20/2017  Svara Twyman Kinslow [birth year] [id number]   Spoke with Donnalee Curry, SW at facility regarding patient. She reports that she has called the patient daughter regarding palliative care referral. She has not heard back as of yet.  Per EPIC chart review, Patient has had 4 admissions in the past 6 months. She has hx of COPD, DM, HF.  Patient eligible for [facility] community care management services when she discharges.   Plan to follow up with patient, patient family and SW at facility next week.  Royetta Crochet. Laymond Purser, RN, BSN, [place] [phone]) Business Cell  [phone]) Toll Free Office

## 2017-03-21 ENCOUNTER — Emergency Department: Payer: Medicare Other

## 2017-03-21 ENCOUNTER — Inpatient Hospital Stay
Admission: EM | Admit: 2017-03-21 | Discharge: 2017-04-08 | DRG: 291 | Disposition: A | Discharge status: Hospice | Payer: Medicare Other | Attending: Internal Medicine | Admitting: Internal Medicine

## 2017-03-21 DIAGNOSIS — Z951 Presence of aortocoronary bypass graft: Secondary | ICD-10-CM

## 2017-03-21 DIAGNOSIS — Z7901 Long term (current) use of anticoagulants: Secondary | ICD-10-CM

## 2017-03-21 DIAGNOSIS — R42 Dizziness and giddiness: Secondary | ICD-10-CM

## 2017-03-21 DIAGNOSIS — Z9981 Dependence on supplemental oxygen: Secondary | ICD-10-CM | POA: Diagnosis not present

## 2017-03-21 DIAGNOSIS — I248 Other forms of acute ischemic heart disease: Secondary | ICD-10-CM | POA: Diagnosis present

## 2017-03-21 DIAGNOSIS — E871 Hypo-osmolality and hyponatremia: Secondary | ICD-10-CM | POA: Diagnosis not present

## 2017-03-21 DIAGNOSIS — J90 Pleural effusion, not elsewhere classified: Secondary | ICD-10-CM | POA: Diagnosis not present

## 2017-03-21 DIAGNOSIS — J189 Pneumonia, unspecified organism: Secondary | ICD-10-CM | POA: Diagnosis present

## 2017-03-21 DIAGNOSIS — C3490 Malignant neoplasm of unspecified part of unspecified bronchus or lung: Secondary | ICD-10-CM

## 2017-03-21 DIAGNOSIS — C342 Malignant neoplasm of middle lobe, bronchus or lung: Secondary | ICD-10-CM | POA: Diagnosis present

## 2017-03-21 DIAGNOSIS — R531 Weakness: Secondary | ICD-10-CM | POA: Diagnosis not present

## 2017-03-21 DIAGNOSIS — N289 Disorder of kidney and ureter, unspecified: Secondary | ICD-10-CM | POA: Diagnosis not present

## 2017-03-21 DIAGNOSIS — Z87891 Personal history of nicotine dependence: Secondary | ICD-10-CM

## 2017-03-21 DIAGNOSIS — I4891 Unspecified atrial fibrillation: Secondary | ICD-10-CM | POA: Diagnosis not present

## 2017-03-21 DIAGNOSIS — C799 Secondary malignant neoplasm of unspecified site: Secondary | ICD-10-CM | POA: Diagnosis present

## 2017-03-21 DIAGNOSIS — L89150 Pressure ulcer of sacral region, unstageable: Secondary | ICD-10-CM | POA: Diagnosis present

## 2017-03-21 DIAGNOSIS — Z66 Do not resuscitate: Secondary | ICD-10-CM | POA: Diagnosis not present

## 2017-03-21 DIAGNOSIS — J910 Malignant pleural effusion: Secondary | ICD-10-CM | POA: Diagnosis not present

## 2017-03-21 DIAGNOSIS — M48061 Spinal stenosis, lumbar region without neurogenic claudication: Secondary | ICD-10-CM | POA: Diagnosis not present

## 2017-03-21 DIAGNOSIS — R7989 Other specified abnormal findings of blood chemistry: Secondary | ICD-10-CM

## 2017-03-21 DIAGNOSIS — E1165 Type 2 diabetes mellitus with hyperglycemia: Secondary | ICD-10-CM | POA: Diagnosis present

## 2017-03-21 DIAGNOSIS — E785 Hyperlipidemia, unspecified: Secondary | ICD-10-CM | POA: Diagnosis present

## 2017-03-21 DIAGNOSIS — L89020 Pressure ulcer of left elbow, unstageable: Secondary | ICD-10-CM | POA: Diagnosis not present

## 2017-03-21 DIAGNOSIS — L89610 Pressure ulcer of right heel, unstageable: Secondary | ICD-10-CM | POA: Diagnosis not present

## 2017-03-21 DIAGNOSIS — L8990 Pressure ulcer of unspecified site, unspecified stage: Secondary | ICD-10-CM | POA: Insufficient documentation

## 2017-03-21 DIAGNOSIS — I130 Hypertensive heart and chronic kidney disease with heart failure and stage 1 through stage 4 chronic kidney disease, or unspecified chronic kidney disease: Secondary | ICD-10-CM | POA: Diagnosis not present

## 2017-03-21 DIAGNOSIS — N183 Chronic kidney disease, stage 3 (moderate): Secondary | ICD-10-CM | POA: Diagnosis present

## 2017-03-21 DIAGNOSIS — I482 Chronic atrial fibrillation: Secondary | ICD-10-CM | POA: Diagnosis present

## 2017-03-21 DIAGNOSIS — I2510 Atherosclerotic heart disease of native coronary artery without angina pectoris: Secondary | ICD-10-CM | POA: Diagnosis present

## 2017-03-21 DIAGNOSIS — Z833 Family history of diabetes mellitus: Secondary | ICD-10-CM

## 2017-03-21 DIAGNOSIS — Z7189 Other specified counseling: Secondary | ICD-10-CM | POA: Diagnosis not present

## 2017-03-21 DIAGNOSIS — I5033 Acute on chronic diastolic (congestive) heart failure: Secondary | ICD-10-CM | POA: Diagnosis present

## 2017-03-21 DIAGNOSIS — Z794 Long term (current) use of insulin: Secondary | ICD-10-CM | POA: Diagnosis not present

## 2017-03-21 DIAGNOSIS — R059 Cough, unspecified: Secondary | ICD-10-CM

## 2017-03-21 DIAGNOSIS — R609 Edema, unspecified: Secondary | ICD-10-CM

## 2017-03-21 DIAGNOSIS — L89620 Pressure ulcer of left heel, unstageable: Secondary | ICD-10-CM | POA: Diagnosis not present

## 2017-03-21 DIAGNOSIS — N179 Acute kidney failure, unspecified: Secondary | ICD-10-CM | POA: Diagnosis present

## 2017-03-21 DIAGNOSIS — E1122 Type 2 diabetes mellitus with diabetic chronic kidney disease: Secondary | ICD-10-CM | POA: Diagnosis present

## 2017-03-21 DIAGNOSIS — B370 Candidal stomatitis: Secondary | ICD-10-CM | POA: Diagnosis present

## 2017-03-21 DIAGNOSIS — Z8249 Family history of ischemic heart disease and other diseases of the circulatory system: Secondary | ICD-10-CM

## 2017-03-21 DIAGNOSIS — E43 Unspecified severe protein-calorie malnutrition: Secondary | ICD-10-CM | POA: Diagnosis present

## 2017-03-21 DIAGNOSIS — E039 Hypothyroidism, unspecified: Secondary | ICD-10-CM | POA: Diagnosis present

## 2017-03-21 DIAGNOSIS — R944 Abnormal results of kidney function studies: Secondary | ICD-10-CM | POA: Diagnosis not present

## 2017-03-21 DIAGNOSIS — J449 Chronic obstructive pulmonary disease, unspecified: Secondary | ICD-10-CM | POA: Diagnosis present

## 2017-03-21 DIAGNOSIS — I5032 Chronic diastolic (congestive) heart failure: Secondary | ICD-10-CM | POA: Diagnosis not present

## 2017-03-21 DIAGNOSIS — J9811 Atelectasis: Secondary | ICD-10-CM | POA: Diagnosis present

## 2017-03-21 DIAGNOSIS — G629 Polyneuropathy, unspecified: Secondary | ICD-10-CM | POA: Diagnosis not present

## 2017-03-21 DIAGNOSIS — C50912 Malignant neoplasm of unspecified site of left female breast: Secondary | ICD-10-CM | POA: Diagnosis not present

## 2017-03-21 DIAGNOSIS — Z515 Encounter for palliative care: Secondary | ICD-10-CM | POA: Diagnosis not present

## 2017-03-21 DIAGNOSIS — L89010 Pressure ulcer of right elbow, unstageable: Secondary | ICD-10-CM | POA: Diagnosis not present

## 2017-03-21 DIAGNOSIS — R600 Localized edema: Secondary | ICD-10-CM

## 2017-03-21 DIAGNOSIS — I129 Hypertensive chronic kidney disease with stage 1 through stage 4 chronic kidney disease, or unspecified chronic kidney disease: Secondary | ICD-10-CM | POA: Diagnosis not present

## 2017-03-21 DIAGNOSIS — C50911 Malignant neoplasm of unspecified site of right female breast: Secondary | ICD-10-CM | POA: Diagnosis not present

## 2017-03-21 DIAGNOSIS — E119 Type 2 diabetes mellitus without complications: Secondary | ICD-10-CM | POA: Diagnosis not present

## 2017-03-21 DIAGNOSIS — Z9889 Other specified postprocedural states: Secondary | ICD-10-CM

## 2017-03-21 DIAGNOSIS — E1151 Type 2 diabetes mellitus with diabetic peripheral angiopathy without gangrene: Secondary | ICD-10-CM | POA: Diagnosis present

## 2017-03-21 DIAGNOSIS — R918 Other nonspecific abnormal finding of lung field: Secondary | ICD-10-CM | POA: Diagnosis not present

## 2017-03-21 DIAGNOSIS — Z7401 Bed confinement status: Secondary | ICD-10-CM | POA: Diagnosis not present

## 2017-03-21 DIAGNOSIS — E875 Hyperkalemia: Secondary | ICD-10-CM | POA: Diagnosis present

## 2017-03-21 DIAGNOSIS — Z9861 Coronary angioplasty status: Secondary | ICD-10-CM

## 2017-03-21 DIAGNOSIS — R5383 Other fatigue: Secondary | ICD-10-CM | POA: Diagnosis not present

## 2017-03-21 DIAGNOSIS — R601 Generalized edema: Secondary | ICD-10-CM | POA: Diagnosis not present

## 2017-03-21 DIAGNOSIS — R680 Hypothermia, not associated with low environmental temperature: Secondary | ICD-10-CM | POA: Diagnosis present

## 2017-03-21 DIAGNOSIS — C781 Secondary malignant neoplasm of mediastinum: Secondary | ICD-10-CM | POA: Diagnosis not present

## 2017-03-21 DIAGNOSIS — Z85820 Personal history of malignant melanoma of skin: Secondary | ICD-10-CM

## 2017-03-21 DIAGNOSIS — R778 Other specified abnormalities of plasma proteins: Secondary | ICD-10-CM

## 2017-03-21 DIAGNOSIS — C3481 Malignant neoplasm of overlapping sites of right bronchus and lung: Secondary | ICD-10-CM | POA: Diagnosis not present

## 2017-03-21 DIAGNOSIS — R1310 Dysphagia, unspecified: Secondary | ICD-10-CM | POA: Diagnosis present

## 2017-03-21 DIAGNOSIS — R05 Cough: Secondary | ICD-10-CM

## 2017-03-21 DIAGNOSIS — Z79899 Other long term (current) drug therapy: Secondary | ICD-10-CM

## 2017-03-21 DIAGNOSIS — C7989 Secondary malignant neoplasm of other specified sites: Secondary | ICD-10-CM | POA: Diagnosis not present

## 2017-03-21 DIAGNOSIS — I5023 Acute on chronic systolic (congestive) heart failure: Secondary | ICD-10-CM | POA: Diagnosis present

## 2017-03-21 DIAGNOSIS — C801 Malignant (primary) neoplasm, unspecified: Secondary | ICD-10-CM | POA: Diagnosis not present

## 2017-03-21 DIAGNOSIS — I509 Heart failure, unspecified: Secondary | ICD-10-CM | POA: Diagnosis not present

## 2017-03-21 LAB — BASIC METABOLIC PANEL
ANION GAP: 6 (ref 5–15)
BUN: 47 mg/dL — ABNORMAL HIGH (ref 6–20)
CALCIUM: 8.3 mg/dL — AB (ref 8.9–10.3)
CO2: 27 mmol/L (ref 22–32)
Chloride: 87 mmol/L — ABNORMAL LOW (ref 101–111)
Creatinine, Ser: 1.87 mg/dL — ABNORMAL HIGH (ref 0.44–1.00)
GFR calc Af Amer: 26 mL/min — ABNORMAL LOW (ref >60)
GFR, EST NON AFRICAN AMERICAN: 23 mL/min — AB (ref >60)
Glucose, Bld: 197 mg/dL — ABNORMAL HIGH (ref 65–99)
POTASSIUM: 5.5 mmol/L — AB (ref 3.5–5.1)
SODIUM: 120 mmol/L — AB (ref 135–145)

## 2017-03-21 LAB — CBC
HCT: 35 % (ref 35.0–47.0)
HEMOGLOBIN: 11.4 g/dL — AB (ref 12.0–16.0)
MCH: 27.4 pg (ref 26.0–34.0)
MCHC: 32.6 g/dL (ref 32.0–36.0)
MCV: 84.1 fL (ref 80.0–100.0)
Platelets: 225 10*3/uL (ref 150–440)
RBC: 4.16 MIL/uL (ref 3.80–5.20)
RDW: 15.2 % — ABNORMAL HIGH (ref 11.5–14.5)
WBC: 38.4 10*3/uL — AB (ref 3.6–11.0)

## 2017-03-21 LAB — TROPONIN I
Troponin I: 0.04 ng/mL (ref <0.03)
Troponin I: 0.29 ng/mL (ref <0.03)
Troponin I: 0.06 ng/mL (ref <0.03)

## 2017-03-21 LAB — LACTIC ACID, PLASMA
Lactic Acid, Venous: 1.2 mmol/L (ref 0.5–1.9)
Lactic Acid, Venous: 1.9 mmol/L (ref 0.5–1.9)

## 2017-03-21 LAB — GLUCOSE, CAPILLARY
Glucose-Capillary: 320 mg/dL — ABNORMAL HIGH (ref 65–99)
Glucose-Capillary: 200 mg/dL — ABNORMAL HIGH (ref 65–99)

## 2017-03-21 LAB — BRAIN NATRIURETIC PEPTIDE: B NATRIURETIC PEPTIDE 5: 226 pg/mL — AB (ref 0.0–100.0)

## 2017-03-21 MED ORDER — BUDESONIDE 0.5 MG/2ML IN SUSP
0.5000 mg | Freq: Two times a day (BID) | RESPIRATORY_TRACT | Status: DC
  Administered 2017-03-21 – 2017-04-08 (×35): 0.5 mg via RESPIRATORY_TRACT
  Filled 2017-03-21 (×35): qty 2

## 2017-03-21 MED ORDER — ATORVASTATIN CALCIUM 20 MG PO TABS
10.0000 mg | ORAL_TABLET | Freq: Every day | ORAL | Status: DC
  Administered 2017-03-21 – 2017-04-07 (×18): 10 mg via ORAL
  Filled 2017-03-21 (×19): qty 1

## 2017-03-21 MED ORDER — LEVOTHYROXINE SODIUM 50 MCG PO TABS
50.0000 ug | ORAL_TABLET | Freq: Every day | ORAL | Status: DC
  Administered 2017-03-22 – 2017-04-08 (×18): 50 ug via ORAL
  Filled 2017-03-21 (×17): qty 1

## 2017-03-21 MED ORDER — ONDANSETRON HCL 4 MG PO TABS: 4.0000 mg | ORAL_TABLET | Freq: Four times a day (QID) | ORAL | Status: DC | PRN

## 2017-03-21 MED ORDER — OXYCODONE HCL 5 MG PO TABS
2.5000 mg | ORAL_TABLET | ORAL | Status: DC | PRN
  Administered 2017-03-22: 2.5 mg via ORAL
  Filled 2017-03-21: qty 1

## 2017-03-21 MED ORDER — AZITHROMYCIN 250 MG PO TABS
250.0000 mg | ORAL_TABLET | Freq: Every day | ORAL | Status: AC
  Administered 2017-03-22 – 2017-03-25 (×4): 250 mg via ORAL
  Filled 2017-03-21 (×4): qty 1

## 2017-03-21 MED ORDER — FUROSEMIDE 10 MG/ML IJ SOLN: 40.0000 mg | Freq: Two times a day (BID) | INTRAMUSCULAR | Status: DC

## 2017-03-21 MED ORDER — MENTHOL 3 MG MT LOZG: 1.0000 | LOZENGE | OROMUCOSAL | Status: DC | PRN

## 2017-03-21 MED ORDER — FUROSEMIDE 10 MG/ML IJ SOLN
INTRAMUSCULAR | Status: AC
  Administered 2017-03-21: 80 mg via INTRAVENOUS
  Filled 2017-03-21: qty 8

## 2017-03-21 MED ORDER — DOCUSATE SODIUM 100 MG PO CAPS
100.0000 mg | ORAL_CAPSULE | Freq: Two times a day (BID) | ORAL | Status: DC
  Administered 2017-03-21 – 2017-04-08 (×36): 100 mg via ORAL
  Filled 2017-03-21 (×38): qty 1

## 2017-03-21 MED ORDER — INSULIN ASPART 100 UNIT/ML ~~LOC~~ SOLN
0.0000 [IU] | Freq: Three times a day (TID) | SUBCUTANEOUS | Status: DC
  Administered 2017-03-21: 7 [IU] via SUBCUTANEOUS
  Administered 2017-03-22: 2 [IU] via SUBCUTANEOUS
  Administered 2017-03-22 (×2): 3 [IU] via SUBCUTANEOUS
  Administered 2017-03-23: 5 [IU] via SUBCUTANEOUS
  Administered 2017-03-23: 2 [IU] via SUBCUTANEOUS
  Administered 2017-03-23 – 2017-03-24 (×3): 3 [IU] via SUBCUTANEOUS
  Administered 2017-03-24: 5 [IU] via SUBCUTANEOUS
  Administered 2017-03-25: 2 [IU] via SUBCUTANEOUS
  Administered 2017-03-25: 3 [IU] via SUBCUTANEOUS
  Filled 2017-03-21 (×2): qty 3
  Filled 2017-03-21: qty 2
  Filled 2017-03-21 (×3): qty 3
  Filled 2017-03-21: qty 2
  Filled 2017-03-21: qty 3
  Filled 2017-03-21: qty 5
  Filled 2017-03-21: qty 2
  Filled 2017-03-21: qty 7

## 2017-03-21 MED ORDER — AMIODARONE HCL 200 MG PO TABS
200.0000 mg | ORAL_TABLET | Freq: Two times a day (BID) | ORAL | Status: DC
  Administered 2017-03-21 – 2017-04-08 (×38): 200 mg via ORAL
  Filled 2017-03-21 (×38): qty 1

## 2017-03-21 MED ORDER — INSULIN ASPART 100 UNIT/ML ~~LOC~~ SOLN
0.0000 [IU] | Freq: Every day | SUBCUTANEOUS | Status: DC
  Administered 2017-03-22 – 2017-03-23 (×2): 2 [IU] via SUBCUTANEOUS
  Administered 2017-03-24: 3 [IU] via SUBCUTANEOUS
  Filled 2017-03-21 (×2): qty 2
  Filled 2017-03-21: qty 3

## 2017-03-21 MED ORDER — IPRATROPIUM-ALBUTEROL 0.5-2.5 (3) MG/3ML IN SOLN
3.0000 mL | Freq: Four times a day (QID) | RESPIRATORY_TRACT | Status: DC
  Administered 2017-03-21 – 2017-03-22 (×5): 3 mL via RESPIRATORY_TRACT
  Filled 2017-03-21 (×5): qty 3

## 2017-03-21 MED ORDER — TRAZODONE HCL 50 MG PO TABS
25.0000 mg | ORAL_TABLET | Freq: Every day | ORAL | Status: DC
  Administered 2017-03-21 – 2017-04-07 (×18): 25 mg via ORAL
  Filled 2017-03-21 (×18): qty 1

## 2017-03-21 MED ORDER — FUROSEMIDE 20 MG PO TABS
20.0000 mg | ORAL_TABLET | Freq: Every day | ORAL | Status: DC
  Administered 2017-03-21: 20 mg via ORAL
  Filled 2017-03-21: qty 1

## 2017-03-21 MED ORDER — ACETAMINOPHEN 650 MG RE SUPP: 650.0000 mg | Freq: Four times a day (QID) | RECTAL | Status: DC | PRN

## 2017-03-21 MED ORDER — FUROSEMIDE 10 MG/ML IJ SOLN
20.0000 mg | Freq: Once | INTRAMUSCULAR | Status: AC
  Administered 2017-03-21: 20 mg via INTRAVENOUS
  Filled 2017-03-21: qty 2

## 2017-03-21 MED ORDER — APIXABAN 2.5 MG PO TABS
2.5000 mg | ORAL_TABLET | Freq: Two times a day (BID) | ORAL | Status: DC
  Administered 2017-03-21 – 2017-03-23 (×5): 2.5 mg via ORAL
  Filled 2017-03-21 (×5): qty 1

## 2017-03-21 MED ORDER — DILTIAZEM HCL ER COATED BEADS 240 MG PO CP24
240.0000 mg | ORAL_CAPSULE | Freq: Every day | ORAL | Status: DC
  Administered 2017-03-21 – 2017-03-23 (×3): 240 mg via ORAL
  Filled 2017-03-21 (×3): qty 1

## 2017-03-21 MED ORDER — ACETAMINOPHEN 325 MG PO TABS
650.0000 mg | ORAL_TABLET | Freq: Four times a day (QID) | ORAL | Status: DC | PRN
  Administered 2017-03-24 – 2017-03-31 (×8): 650 mg via ORAL
  Filled 2017-03-21 (×8): qty 2

## 2017-03-21 MED ORDER — DEXTROMETHORPHAN-BENZOCAINE 5-7.5 MG MT LOZG: 1.0000 | LOZENGE | Freq: Every day | OROMUCOSAL | Status: DC | PRN

## 2017-03-21 MED ORDER — AZITHROMYCIN 250 MG PO TABS
500.0000 mg | ORAL_TABLET | Freq: Every day | ORAL | Status: AC
  Administered 2017-03-21: 500 mg via ORAL
  Filled 2017-03-21: qty 2

## 2017-03-21 MED ORDER — VITAMIN B-12 1000 MCG PO TABS
1000.0000 ug | ORAL_TABLET | Freq: Every day | ORAL | Status: DC
  Administered 2017-03-21 – 2017-04-08 (×20): 1000 ug via ORAL
  Filled 2017-03-21 (×20): qty 1

## 2017-03-21 MED ORDER — NYSTATIN 100000 UNIT/ML MT SUSP
10.0000 mL | Freq: Four times a day (QID) | OROMUCOSAL | Status: DC
  Administered 2017-03-21 (×2): 1000000 [IU] via ORAL
  Filled 2017-03-21 (×3): qty 10

## 2017-03-21 MED ORDER — DIPHENHYDRAMINE HCL 25 MG PO CAPS: 25.0000 mg | ORAL_CAPSULE | Freq: Every evening | ORAL | Status: DC | PRN

## 2017-03-21 MED ORDER — ORAL CARE MOUTH RINSE
15.0000 mL | Freq: Two times a day (BID) | OROMUCOSAL | Status: DC
  Administered 2017-03-21 – 2017-04-07 (×22): 15 mL via OROMUCOSAL

## 2017-03-21 MED ORDER — DEXTROSE 5 % IV SOLN
1.0000 g | INTRAVENOUS | Status: DC
  Administered 2017-03-21 – 2017-04-01 (×12): 1 g via INTRAVENOUS
  Filled 2017-03-21 (×12): qty 10

## 2017-03-21 MED ORDER — BIOTENE DRY MOUTH MT LIQD: 15.0000 mL | OROMUCOSAL | Status: DC | PRN

## 2017-03-21 MED ORDER — NITROGLYCERIN 0.4 MG SL SUBL: 0.4000 mg | SUBLINGUAL_TABLET | SUBLINGUAL | Status: DC | PRN

## 2017-03-21 MED ORDER — FUROSEMIDE 10 MG/ML IJ SOLN
80.0000 mg | INTRAMUSCULAR | Status: AC
  Administered 2017-03-21: 80 mg via INTRAVENOUS

## 2017-03-21 MED ORDER — FUROSEMIDE 10 MG/ML IJ SOLN
40.0000 mg | Freq: Once | INTRAMUSCULAR | Status: AC
  Administered 2017-03-21: 40 mg via INTRAVENOUS
  Filled 2017-03-21: qty 4

## 2017-03-21 MED ORDER — ONDANSETRON HCL 4 MG/2ML IJ SOLN: 4.0000 mg | Freq: Four times a day (QID) | INTRAMUSCULAR | Status: DC | PRN

## 2017-03-21 NOTE — ED Notes (Signed)
Elevated troponin reported to Dr Beather Arbour

## 2017-03-21 NOTE — ED Triage Notes (Signed)
Pt arrived via ems - nursing home called ems due to concern for pt edema in bilat ext - pt always has bilat pitting edema with weeping but today it has been worse per the nursing home - also reported that today the weeping is blood tinged - pt has also seemed weak today with c/o dizziness

## 2017-03-21 NOTE — Progress Notes (Signed)
Mattress replacement ordered.

## 2017-03-21 NOTE — Progress Notes (Signed)
MD notified of low Temp. Appropriate orders placed.

## 2017-03-21 NOTE — ED Notes (Signed)
Blood recollected per lab request

## 2017-03-21 NOTE — Progress Notes (Signed)
MD notified of elevated troponin. No new orders.

## 2017-03-21 NOTE — Progress Notes (Signed)
Bil LE wrapped with unna boots and elevated.

## 2017-03-21 NOTE — Progress Notes (Signed)
Patient ID: KEITRA CARUSONE, female   DOB: 11-Aug-1925, 83 y.o.   MRN: [id number]  [employer] PROGRESS NOTE  Carita Sollars Mun WHQ:[id number] DOB: [birth year] DOA: 03/21/2017 PCP: [name], MD  HPI/Subjective: Patient with weakness, feeling rough and shortness of breath  Objective: Vitals:   03/21/17 1013 03/21/17 1202  BP:  (!) 118/44  Pulse:  77  Resp:  20  Temp: (!) 95.9 F (35.5 C) 97.9 F (36.6 C)    Filed Weights   03/21/17 0054  Weight: 63.5 kg (140 lb)    ROS: Review of Systems  Constitutional: Negative for chills and fever.  Eyes: Negative for blurred vision.  Respiratory: Positive for cough and shortness of breath.   Cardiovascular: Negative for chest pain.  Gastrointestinal: Negative for abdominal pain, constipation, diarrhea, nausea and vomiting.  Genitourinary: Negative for dysuria.  Musculoskeletal: Negative for joint pain.  Neurological: Positive for weakness. Negative for dizziness and headaches.   Exam: Physical Exam  Constitutional: She is oriented to person, place, and time.  HENT:  Nose: No mucosal edema.  Mouth/Throat: No oropharyngeal exudate or posterior oropharyngeal edema.  Eyes: Conjunctivae, EOM and lids are normal. Pupils are equal, round, and reactive to light.  Neck: No JVD present. Carotid bruit is not present. No edema present. No thyroid mass and no thyromegaly present.  Cardiovascular: S1 normal and S2 normal.  Exam reveals no gallop.   No murmur heard. Pulses:      Dorsalis pedis pulses are 2+ on the right side, and 2+ on the left side.  Respiratory: No respiratory distress. She has no wheezes. She has no rhonchi. She has no rales.  GI: Soft. Bowel sounds are normal. There is no tenderness.  Musculoskeletal:       Right wrist: She exhibits swelling.       Right ankle: She exhibits swelling.       Left ankle: She exhibits swelling.  Lymphadenopathy:    She has no cervical adenopathy.  Neurological: She is alert and  oriented to person, place, and time. No cranial nerve deficit.  Skin: Skin is warm. No rash noted. Nails show no clubbing.  Psychiatric: She has a normal mood and affect.      Data Reviewed: Basic Metabolic Panel:  Recent Labs Lab 03/21/17 0209  NA 120*  K 5.5*  CL 87*  CO2 27  GLUCOSE 197*  BUN 47*  CREATININE 1.87*  CALCIUM 8.3*   CBC:  Recent Labs Lab 03/21/17 0123  WBC 38.4*  HGB 11.4*  HCT 35.0  MCV 84.1  PLT 225   Cardiac Enzymes:  Recent Labs Lab 03/21/17 0209 03/21/17 1014 03/21/17 1341  TROPONINI 0.04* 0.29* 0.06*   BNP (last 3 results)  Recent Labs  01/06/17 1044 03/07/17 2238 03/21/17 0209  BNP 362.0* 182.0* 226.0*      Studies: Dg Chest Port 1 View  Result Date: 03/21/2017 CLINICAL DATA:  Weakness and lightheadedness. Lower extremity edema. EXAM: PORTABLE CHEST 1 VIEW COMPARISON:  03/11/2017 FINDINGS: Pleural effusions bilaterally, increased from 03/11/2017. Diffuse interstitial fluid or thickening. Moderate vascular prominence. Central and basilar airspace opacities, right greater than left. IMPRESSION: The findings probably represent congestive heart failure with interstitial and alveolar edema as well as bilateral pleural effusions. Infectious infiltrates are not entirely excluded. Electronically Signed   By: Andreas Newport M.D.   On: 03/21/2017 01:52    Scheduled Meds: . amiodarone  200 mg Oral BID  . apixaban  2.5 mg Oral BID  . atorvastatin  10 mg Oral q1800  . [START ON 03/22/2017] azithromycin  250 mg Oral Daily  . budesonide (PULMICORT) nebulizer solution  0.5 mg Nebulization BID  . cefTRIAXone (ROCEPHIN)  IV  1 g Intravenous Q24H  . diltiazem  240 mg Oral Daily  . docusate sodium  100 mg Oral BID  . furosemide  20 mg Intravenous Once  . [START ON 03/22/2017] furosemide  40 mg Intravenous BID  . insulin aspart  0-5 Units Subcutaneous QHS  . insulin aspart  0-9 Units Subcutaneous TID WC  . ipratropium-albuterol  3 mL  Nebulization QID  . [START ON 03/22/2017] levothyroxine  50 mcg Oral QAC breakfast  . mouth rinse  15 mL Mouth Rinse BID  . nystatin  10 mL Oral QID  . traZODone  25 mg Oral QHS  . cyanocobalamin  1,000 mcg Oral Daily    Assessment/Plan:  1. Acute diastolic congestive heart failure. Restart IV Lasix 2. Acute kidney injury. Monitor with diuresis 3. Hyperkalemia should be improved with IV Lasix 4. Atrial fibrillation. Rate controlled with amiodarone. Anticoagulation with eliquis 5. History of lung cancer 6. History of hyponatremia 7. Type 2 diabetes mellitus on sliding scale 8. Hyperlipidemia on statin 9. Hypothermia, possible pneumonia. Check blood cultures and start antibiotics 10. Poor air entry start nebulizer treatments with budesonide   Code Status:     Code Status Orders        Start     Ordered   03/21/17 0732  Do not attempt resuscitation (DNR)  Continuous    Question Answer Comment  In the event of cardiac or respiratory ARREST Do not call a "code blue"   In the event of cardiac or respiratory ARREST Do not perform Intubation, CPR, defibrillation or ACLS   In the event of cardiac or respiratory ARREST Use medication by any route, position, wound care, and other measures to relive pain and suffering. May use oxygen, suction and manual treatment of airway obstruction as needed for comfort.      03/21/17 0731    Code Status History    Date Active Date Inactive Code Status Order ID Comments User Context   03/13/2017  9:43 AM 03/14/2017 12:09 AM DNR [id number]  Melton Alar, PA-C Inpatient   03/08/2017  2:15 AM 03/13/2017  9:43 AM Partial Code [id number]  Harrie Foreman, MD ED   01/07/2017  7:26 PM 01/09/2017  5:42 PM Partial Code [id number]  Vaughan Basta, MD Inpatient   01/06/2017  3:21 PM 01/07/2017  7:26 PM Full Code [id number]  Epifanio Lesches, MD ED   11/28/2016  7:40 PM 11/29/2016  3:44 PM Full Code [id number]  Vaughan Basta, MD Inpatient    Advance  Directive Documentation     Most Recent Value  Type of Advance Directive  Healthcare Power of Attorney  Pre-existing out of facility DNR order (yellow form or pink MOST form)  -  "MOST" Form in Place?  -     Family Communication: Family at the bedside Disposition Plan: To be determined  Antibiotics:  Rocephin  Zithromax  Time spent: Total time 50 minutes including ACP time  [place], [place]

## 2017-03-21 NOTE — Progress Notes (Signed)
Patient has a pending PALLIATIVE referral at [facility] from previous admission. Thank you. Flo Shanks RN, BSN, [place] and [facility] [place], [facility] [phone] c

## 2017-03-21 NOTE — NC FL2 (Signed)
[place] LEVEL OF CARE SCREENING TOOL     IDENTIFICATION  Patient Name: Donna Lane Birthdate: 11-11-1925 Sex: female Admission Date (Current Location): 03/21/2017  [place] and [place] Number:  Engineering geologist and Address:  [facility], [address] [place], [place] [address]      Provider Number: [id number]  Attending Physician Name and Address:  Loletha Grayer, MD  Relative Name and Phone Number:       Current Level of Care: Hospital Recommended Level of Care: [place] Prior Approval Number:    Date Approved/Denied:   PASRR Number: [id number]  Discharge Plan: SNF    Current Diagnoses: Patient Active Problem List   Diagnosis Date Noted  . Acute on chronic systolic CHF (congestive heart failure) ([place]) 03/21/2017  . Pressure injury of skin 03/21/2017  . DNR (do not resuscitate)   . Shortness of breath   . Pleural effusion, right   . Healthcare-associated pneumonia   . Palliative care encounter   . Primary insomnia   . Sepsis ([place]) 03/08/2017  . Edema 02/23/2017  . Chronic diastolic heart failure ([place]) 01/19/2017  . Atrial fibrillation with rapid ventricular response ([place]) 01/06/2017  . Acute respiratory distress 01/06/2017  . COPD (chronic obstructive pulmonary disease) ([place]) 01/06/2017  . Hypokalemia 01/06/2017  . Elevated troponin 11/29/2016  . Acute renal insufficiency 11/29/2016  . Hyponatremia 11/29/2016  . Mass of middle lobe of right lung 11/29/2016  . Atelectasis of right lung 11/29/2016  . Left eye complaint 07/14/2016  . Caregiver stress 07/14/2016  . Olecranon bursitis of right elbow 06/19/2016  . Myalgia and myositis 05/20/2016  . Chronic kidney disease, stage 3 01/09/2016  . Advance care planning 01/05/2016  . Spinal stenosis of lumbar region 01/05/2016  . Diabetes mellitus type 2, uncontrolled, with complications ([place]) 59/74/1638  . Hyperlipidemia 03/14/2010  . Essential  hypertension 03/14/2010  . CORONARY ATHEROSLERO AUTOL VEIN BYPASS GRAFT 03/14/2010  . Peripheral arterial disease ([place]) 03/14/2010  . Chest pain 03/14/2010    Orientation RESPIRATION BLADDER Height & Weight     Self, Time, Situation, Place  O2 (2L) Incontinent Weight: 140 lb (63.5 kg) Height:  '5\' 2"'$  (157.5 cm)  BEHAVIORAL SYMPTOMS/MOOD NEUROLOGICAL BOWEL NUTRITION STATUS      Incontinent Diet (Regular Diet)  AMBULATORY STATUS COMMUNICATION OF NEEDS Skin   Extensive Assist Verbally Normal                       Personal Care Assistance Level of Assistance  Bathing, Feeding, Dressing Bathing Assistance: Limited assistance Feeding assistance: Independent Dressing Assistance: Limited assistance     Functional Limitations Info  Sight, Hearing, Speech Sight Info: Adequate Hearing Info: Adequate Speech Info: Adequate    SPECIAL CARE FACTORS FREQUENCY  PT (By licensed PT)     PT Frequency: 5              Contractures Contractures Info: Not present    Additional Factors Info  Code Status, Allergies, Insulin Sliding Scale, Isolation Precautions Code Status Info: DNR Allergies Info: Nsaids   Insulin Sliding Scale Info: 4x/day Isolation Precautions Info: Contact:  MRSA     Current Medications (03/21/2017):  This is the current hospital active medication list Current Facility-Administered Medications  Medication Dose Route Frequency Provider Last Rate Last Dose  . acetaminophen (TYLENOL) tablet 650 mg  650 mg Oral Q6H PRN Harrie Foreman, MD       Or  . acetaminophen (TYLENOL)  suppository 650 mg  650 mg Rectal Q6H PRN Harrie Foreman, MD      . amiodarone (PACERONE) tablet 200 mg  200 mg Oral BID Harrie Foreman, MD   200 mg at 03/21/17 1010  . antiseptic oral rinse (BIOTENE) solution 15 mL  15 mL Mouth Rinse PRN Loletha Grayer, MD      . apixaban [facility] [place]) tablet 2.5 mg  2.5 mg Oral BID Harrie Foreman, MD   2.5 mg at 03/21/17 1322  . atorvastatin (LIPITOR)  tablet 10 mg  10 mg Oral q1800 Harrie Foreman, MD      . Derrill Memo ON 03/22/2017] azithromycin [facility]) tablet 250 mg  250 mg Oral Daily Loletha Grayer, MD      . budesonide (PULMICORT) nebulizer solution 0.5 mg  0.5 mg Nebulization BID Loletha Grayer, MD      . cefTRIAXone (ROCEPHIN) 1 g in dextrose 5 % 50 mL IVPB  1 g Intravenous Q24H Loletha Grayer, MD   1 g at 03/21/17 1322  . diltiazem (CARDIZEM CD) 24 hr capsule 240 mg  240 mg Oral Daily Harrie Foreman, MD   240 mg at 03/21/17 1010  . diphenhydrAMINE (BENADRYL) capsule 25 mg  25 mg Oral QHS PRN Harrie Foreman, MD      . docusate sodium (COLACE) capsule 100 mg  100 mg Oral BID Harrie Foreman, MD   100 mg at 03/21/17 1010  . furosemide (LASIX) injection 20 mg  20 mg Intravenous Once Harrie Foreman, MD      . Derrill Memo ON 03/22/2017] furosemide (LASIX) injection 40 mg  40 mg Intravenous BID Loletha Grayer, MD      . insulin aspart (novoLOG) injection 0-5 Units  0-5 Units Subcutaneous QHS Loletha Grayer, MD      . insulin aspart (novoLOG) injection 0-9 Units  0-9 Units Subcutaneous TID WC Loletha Grayer, MD      . ipratropium-albuterol (DUONEB) 0.5-2.5 (3) MG/3ML nebulizer solution 3 mL  3 mL Nebulization QID Harrie Foreman, MD   3 mL at 03/21/17 1602  . [START ON 03/22/2017] levothyroxine (SYNTHROID, LEVOTHROID) tablet 50 mcg  50 mcg Oral QAC breakfast Harrie Foreman, MD      . MEDLINE mouth rinse  15 mL Mouth Rinse BID Loletha Grayer, MD   15 mL at 03/21/17 1000  . menthol-cetylpyridinium (CEPACOL) lozenge 3 mg  1 lozenge Oral PRN Harrie Foreman, MD      . nitroGLYCERIN (NITROSTAT) SL tablet 0.4 mg  0.4 mg Sublingual Q5 min PRN Harrie Foreman, MD      . nystatin (MYCOSTATIN) 100000 UNIT/ML suspension 1,000,000 Units  10 mL Oral QID Harrie Foreman, MD   1,000,000 Units at 03/21/17 1011  . ondansetron (ZOFRAN) tablet 4 mg  4 mg Oral Q6H PRN Harrie Foreman, MD       Or  . ondansetron [facility]) injection 4 mg  4 mg  Intravenous Q6H PRN Harrie Foreman, MD      . oxyCODONE (Oxy IR/ROXICODONE) immediate release tablet 2.5 mg  2.5 mg Oral Q4H PRN Harrie Foreman, MD      . traZODone (DESYREL) tablet 25 mg  25 mg Oral QHS Harrie Foreman, MD      . vitamin B-12 (CYANOCOBALAMIN) tablet 1,000 mcg  1,000 mcg Oral Daily Harrie Foreman, MD   1,000 mcg at 03/21/17 1010     Discharge Medications: Please see discharge summary for a list of discharge  medications.  Relevant Imaging Results:  Relevant Lab Results:   Additional Information SSN:  [id number]   Palliative Care services  by [facility] [place] to follow at facility.  Darden Dates, LCSW

## 2017-03-21 NOTE — Progress Notes (Signed)
Patient ID: REAGANN DOLCE, female   DOB: 12/19/1924, 83 y.o.   MRN: [id number]  ACP note  Patient, daughter and daughter-in-law is at the bedside.  Patient with multiple medical problems and repeated hospitalizations. Patient already a DO NOT RESUSCITATE.  Patient came in with shortness of breath and weeping edema. Also found to have hypothermic and and feeling very weak. Recently being treated for pneumonia.  Repeated hospitalizations have an overall poor prognosis.  Time spent counseling on diet and eating and drinking and fluid restriction.  Time spent 30 minutes Dr. Loletha Grayer

## 2017-03-21 NOTE — Progress Notes (Addendum)
Inpatient Diabetes Program Recommendations  AACE/ADA: New Consensus Statement on Inpatient Glycemic Control (2015)  Target Ranges:  Prepandial:   less than 140 mg/dL      Peak postprandial:   less than 180 mg/dL (1-2 hours)      Critically ill patients:  140 - 180 mg/dL   Lab Results  Component Value Date   GLUCAP 157 (H) 03/13/2017   HGBA1C 7.5 (H) 01/07/2017     Review of Glycemic Control- lab glucose  Results for EMELINE, SIMPSON (MRN [id number]) as of 03/21/2017 14:34  Ref. Range 03/21/2017 02:09 03/21/2017 10:11 03/21/2017 10:14 03/21/2017 10:16 03/21/2017 13:41  Glucose Latest Ref Range: 65 - 99 mg/dL 197 (H)        Diabetes history:DM2 Outpatient Diabetes medications: Levemir 13 units QHS Novolog 0-10 units tid with meals Current orders for Inpatient glycemic control:Novolog 0-9 units tid, Novolog 0-5 units qhs  Inpatient Diabetes Program Recommendations: Please consider  Levemir 6 units qhs   Gentry Fitz, RN, [place], [place], CDE Diabetes Coordinator Inpatient Diabetes Program  [phone] (Team Pager) [phone] ([place]) 03/21/2017 2:37 PM

## 2017-03-21 NOTE — ED Provider Notes (Signed)
[facility] Emergency Department Provider Note   ____________________________________________   First MD Initiated Contact with Patient 03/21/17 0100     (approximate)  I have reviewed the triage vital signs and the nursing notes.   HISTORY  Chief Complaint Weakness; Dizziness; and Leg Swelling    HPI Donna Lane is a 83 y.o. female brought to the ED from [facility] with a chief complaint of BLE edema.Patient was admitted 3/23 to 3/29 for peripheral edema, pleural effusion and HCAP. She was discharged from the hospital to the nursing facility who sends her for evaluation today of weeping and edematous bilateral lower legs. Nursing staff also report that patient seems weak today with complaints of dizziness. Patient denies dizziness but does complain of some generalized weakness. She is nonambulatory at baseline. Denies fever, chills, chest pain, abdominal pain, nausea, vomiting, diarrhea. States she is always short of breath and wears continuous oxygen. Denies recent travel or trauma.   Past Medical History:  Diagnosis Date  . Arrhythmia    atrial fibrillation  . Chronic diastolic heart failure ([place])   . CKD (chronic kidney disease), stage III   . COPD (chronic obstructive pulmonary disease) ([place])   . Coronary artery disease 2008   CABG x 4,   . Diabetes mellitus   . Diverticulosis    on colonoscopy 08/12/2006  . Hyperlipidemia   . Hypertension   . Melanoma ([place])    removed by derm   . PVD (peripheral vascular disease) ([place])   . Spinal stenosis     Patient Active Problem List   Diagnosis Date Noted  . DNR (do not resuscitate)   . Shortness of breath   . Pleural effusion, right   . Healthcare-associated pneumonia   . Palliative care encounter   . Primary insomnia   . Sepsis ([place]) 03/08/2017  . Edema 02/23/2017  . Chronic diastolic heart failure ([place]) 01/19/2017  . Atrial fibrillation with rapid ventricular response  ([place]) 01/06/2017  . Acute respiratory distress 01/06/2017  . COPD (chronic obstructive pulmonary disease) ([place]) 01/06/2017  . Hypokalemia 01/06/2017  . Elevated troponin 11/29/2016  . Acute renal insufficiency 11/29/2016  . Hyponatremia 11/29/2016  . Mass of middle lobe of right lung 11/29/2016  . Atelectasis of right lung 11/29/2016  . Left eye complaint 07/14/2016  . Caregiver stress 07/14/2016  . Olecranon bursitis of right elbow 06/19/2016  . Myalgia and myositis 05/20/2016  . Chronic kidney disease, stage 3 01/09/2016  . Advance care planning 01/05/2016  . Spinal stenosis of lumbar region 01/05/2016  . Diabetes mellitus type 2, uncontrolled, with complications ([place]) 93/[id number]/[id number]  . Hyperlipidemia 03/14/2010  . Essential hypertension 03/14/2010  . CORONARY ATHEROSLERO AUTOL VEIN BYPASS GRAFT 03/14/2010  . Peripheral arterial disease ([place]) 03/14/2010  . Chest pain 03/14/2010    Past Surgical History:  Procedure Laterality Date  . CARDIAC CATHETERIZATION     PTCA x 3  . CARDIOVERSION N/A 03/07/2017   Procedure: Cardioversion;  Surgeon: Minna Merritts, MD;  Location: [facility] ORS;  Service: Cardiovascular;  Laterality: N/A;  . CATARACT EXTRACTION    . CORONARY ARTERY BYPASS GRAFT  2008   x 4  . HIP FRACTURE SURGERY Left    pins placed w/o hip replacement, hardware removed later  . LUMBAR DISC SURGERY     in her 64s  . tonsillectomy    . VAGINAL DELIVERY     x2    Prior to Admission medications   Medication Sig Start  Date End Date Taking? Authorizing Provider  acetaminophen (TYLENOL) 500 MG tablet Take 500 mg by mouth 2 (two) times daily.    Historical Provider, MD  amiodarone (PACERONE) 200 MG tablet Take 1 tablet (200 mg total) by mouth 2 (two) times daily. 02/14/17   Minna Merritts, MD  amoxicillin-clavulanate (AUGMENTIN) 500-125 MG tablet Take 1 tablet (500 mg total) by mouth every 12 (twelve) hours. 03/13/17   [name], MD  apixaban (ELIQUIS) 2.5 MG TABS tablet Take 1  tablet (2.5 mg total) by mouth 2 (two) times daily. 01/23/17   Minna Merritts, MD  atorvastatin (LIPITOR) 10 MG tablet Take 1 tablet (10 mg total) by mouth daily at 6 PM. 01/23/17   Minna Merritts, MD  barrier cream (NON-SPECIFIED) CREA Apply 1 application topically 2 (two) times daily as needed. 03/13/17   [name], MD  cyanocobalamin 1000 MCG tablet Take 1,000 mcg by mouth daily.    Historical Provider, MD  diltiazem (CARDIZEM CD) 240 MG 24 hr capsule Take 1 capsule (240 mg total) by mouth daily. 01/23/17   Minna Merritts, MD  diphenhydrAMINE (BENADRYL) 25 mg capsule Take 1 capsule (25 mg total) by mouth at bedtime as needed for sleep. 03/13/17   [name], MD  docusate sodium (COLACE) 100 MG capsule Take 1 capsule (100 mg total) by mouth 2 (two) times daily as needed for mild constipation. 03/13/17   [name], MD  insulin detemir (LEVEMIR) 100 UNIT/ML injection Inject 0.04 mLs (4 Units total) into the skin at bedtime. 03/13/17   [name], MD  ipratropium-albuterol (DUONEB) 0.5-2.5 (3) MG/3ML SOLN Take 3 mLs by nebulization 4 (four) times daily. 03/13/17   [name], MD  menthol-cetylpyridinium (CEPACOL) 3 MG lozenge Take 1 lozenge (3 mg total) by mouth as needed (dry mouth). 03/13/17   [name], MD  nitroGLYCERIN (NITROSTAT) 0.4 MG SL tablet Place 1 tablet (0.4 mg total) under the tongue every 5 (five) minutes as needed for chest pain. 01/23/17   Minna Merritts, MD  ondansetron (ZOFRAN) 4 MG tablet Take 1 tablet (4 mg total) by mouth every 6 (six) hours as needed for nausea. 03/13/17   [name], MD  oxyCODONE (OXY IR/ROXICODONE) 5 MG immediate release tablet Take 0.5 tablets (2.5 mg total) by mouth every 4 (four) hours as needed for moderate pain (or SOB). 03/13/17   [name], MD  traZODone (DESYREL) 50 MG tablet Take 0.5 tablets (25 mg total) by mouth at bedtime. 03/13/17   [name], MD    Allergies Nsaids  Family History  Problem Relation Age of Onset  . Heart disease Father    . Heart disease Sister   . Diabetes Sister     Social History Social History  Substance Use Topics  . Smoking status: Former Smoker    Packs/day: 1.00    Years: 50.00    Types: Cigarettes    Quit date: 03/20/2007  . Smokeless tobacco: Never Used  . Alcohol use No    Review of Systems  Constitutional: Positive for generalized weakness. No fever/chills. Eyes: No visual changes. ENT: No sore throat. Cardiovascular: Positive for peripheral edema. Denies chest pain. Respiratory: Denies shortness of breath. Gastrointestinal: No abdominal pain.  No nausea, no vomiting.  No diarrhea.  No constipation. Genitourinary: Negative for dysuria. Musculoskeletal: Negative for back pain. Skin: Negative for rash. Neurological: Negative for headaches, focal weakness or numbness.  10-point ROS otherwise negative.  ____________________________________________   PHYSICAL EXAM:  VITAL SIGNS: ED  Triage Vitals  Enc Vitals Group     BP 03/21/17 0053 (!) 113/42     Pulse Rate 03/21/17 0053 63     Resp 03/21/17 0053 (!) 27     Temp 03/21/17 0053 97.3 F (36.3 C)     Temp Source 03/21/17 0053 Oral     SpO2 03/21/17 0053 97 %     Weight 03/21/17 0054 140 lb (63.5 kg)     Height 03/21/17 0054 '5\' 2"'$  (1.575 m)     Head Circumference --      Peak Flow --      Pain Score 03/21/17 0052 0     Pain Loc --      Pain Edu? --      Excl. in [place]? --     Constitutional: Alert and oriented. Chronically ill appearing and in no acute distress. Eyes: Conjunctivae are normal. PERRL. EOMI. Head: Atraumatic. Nose: No congestion/rhinnorhea. Mouth/Throat: Mucous membranes are moist.  Oropharynx non-erythematous. Neck: No stridor.   Cardiovascular: Normal rate, regular rhythm. Grossly normal heart sounds.  Good peripheral circulation. Respiratory: Increased respiratory effort.  No retractions. Lungs with rales bibasilarly. Gastrointestinal: Soft and nontender. No distention. No abdominal bruits. No CVA  tenderness. Musculoskeletal: No lower extremity tenderness. 3+ pitting BLE edema with areas which are weeping.  No joint effusions. Neurologic:  Normal speech and language. No gross focal neurologic deficits are appreciated.  Skin:  Skin is warm, dry and intact. No rash noted. Psychiatric: Mood and affect are normal. Speech and behavior are normal.  ____________________________________________   LABS (all labs ordered are listed, but only abnormal results are displayed)  Labs Reviewed  CBC - Abnormal; Notable for the following:       Result Value   WBC 38.4 (*)    Hemoglobin 11.4 (*)    RDW 15.2 (*)    All other components within normal limits  BASIC METABOLIC PANEL - Abnormal; Notable for the following:    Sodium 120 (*)    Potassium 5.5 (*)    Chloride 87 (*)    Glucose, Bld 197 (*)    BUN 47 (*)    Creatinine, Ser 1.87 (*)    Calcium 8.3 (*)    GFR calc non Af Amer 23 (*)    GFR calc Af Amer 26 (*)    All other components within normal limits  TROPONIN I - Abnormal; Notable for the following:    Troponin I 0.04 (*)    All other components within normal limits  BRAIN NATRIURETIC PEPTIDE  CBG MONITORING, ED   ____________________________________________  EKG  ED ECG REPORT I, Bryceson Grape J, the attending physician, personally viewed and interpreted this ECG.   Date: 03/21/2017  EKG Time: 0055  Rate: 65  Rhythm: atrial fibrillation, rate 65  Axis: Normal  Intervals:none  ST&T Change: Nonspecific  ____________________________________________  RADIOLOGY  Chest x-ray interpreted per Dr. Alroy Dust: The findings probably represent congestive heart failure with  interstitial and alveolar edema as well as bilateral pleural  effusions. Infectious infiltrates are not entirely excluded.   ____________________________________________   PROCEDURES  Procedure(s) performed: None  Procedures  Critical Care performed:  No  ____________________________________________   INITIAL IMPRESSION / ASSESSMENT AND PLAN / ED COURSE  Pertinent labs & imaging results that were available during my care of the patient were reviewed by me and considered in my medical decision making (see chart for details).  83 year old female sent to the ED for peripheral edema with weeping legs. Will check  screening lab work, chest x-ray, urinalysis for generalized weakness and reassess.  Clinical Course as of Mar 21 313  Fri Mar 21, 2017  0313 Updated patient of laboratory and x-ray results. Reviewing patient's records, it looks like she was on steroids during her recent hospitalization. Her worsening leukocytosis this morning is most likely secondary to recent steroid use as patient is not febrile and x-ray not worsened. Sodium is lower than prior; renal function is worse. Discussed with hospitalist who will evaluate patient in the emergency department for admission.  [JS]    Clinical Course User Index [JS] Paulette Blanch, MD     ____________________________________________   FINAL CLINICAL IMPRESSION(S) / ED DIAGNOSES  Final diagnoses:  Generalized weakness  Peripheral edema  Hyponatremia  Elevated troponin  Renal insufficiency      NEW MEDICATIONS STARTED DURING THIS VISIT:  New Prescriptions   No medications on file     Note:  This document was prepared using Dragon voice recognition software and may include unintentional dictation errors.    Paulette Blanch, MD 03/21/17 [phone]

## 2017-03-21 NOTE — Consult Note (Addendum)
[place] Nurse wound consult note Reason for Consult: Consult requested for sacrum Wound type: Deep tissue injury was noted to be present on admission to sacrum/upper gluteal cleft in the nursing flowsheet Pressure Injury POA: Yes Measurement: 7X4cm Wound bed: Patchy areas of dark purple-red with loose edges beginning to peel in patchy areas Drainage (amount, consistency, odor) scant amt pink drainage, no odor Dressing procedure/placement/frequency: Daughter at the bedside did not want to look at the wound appearance, discussed evolution of deep tissue injuries within the next 7-10 days and wound is high risk to evolve into unstageable or full thickness skin loss.  She states pt has been staying in a facility and sleeps in a recliner most of the time.  Stressed importance of positioning off the affected area and providing pressure reducing measures.  Air mattress ordered.  Foam dressing to protect from further injury. Daughter verbalized understanding. Please re-consult if further assistance is needed.  Thank-you,  Julien Girt MSN, [place], [place], [place], [place]

## 2017-03-21 NOTE — ED Notes (Addendum)
Pt arrived via ems - nursing home called ems due to concern for pt edema in bilat ext x4 - pt always has bilat pitting edema with weeping but today it has been worse per the nursing home - also reported that today the weeping is blood tinged - pt has also seemed weak today with c/o dizziness

## 2017-03-21 NOTE — H&P (Signed)
Donna Lane is an 83 y.o. female.   Chief Complaint: Leg swelling HPI: The patient was discharged from the hospital one week ago following admission for pneumonia returns to the emergency department from her nursing home due to weeping lower extremities. The patient states that she is weak and that her legs are heavy 2 left. She denies other complaints although she is clearly short of breath. The patient is well-known to our service and has been in denial about her diagnosis of lung cancer. Upon admission last time the patient also denies shortness of breath however chest x-ray in the emergency department shows vascular congestion consistent with congestive heart failure. The patient's respiratory rate is elevated as well. She denies chest pain. Past medical history significant for atrial fibrillation, coronary artery disease status post CABG and diabetes. Hyponatremia this still present upon laboratory evaluation today as well as acute on chronic kidney failure which prompted the emergency department staff called the hospitalist service for admission.  Past Medical History:  Diagnosis Date  . Arrhythmia    atrial fibrillation  . Chronic diastolic heart failure ([place])   . CKD (chronic kidney disease), stage III   . COPD (chronic obstructive pulmonary disease) ([place])   . Coronary artery disease 2008   CABG x 4,   . Diabetes mellitus   . Diverticulosis    on colonoscopy 08/12/2006  . Hyperlipidemia   . Hypertension   . Melanoma ([place])    removed by derm   . PVD (peripheral vascular disease) ([place])   . Spinal stenosis     Past Surgical History:  Procedure Laterality Date  . CARDIAC CATHETERIZATION     PTCA x 3  . CARDIOVERSION N/A 03/07/2017   Procedure: Cardioversion;  Surgeon: Minna Merritts, MD;  Location: [facility] ORS;  Service: Cardiovascular;  Laterality: N/A;  . CATARACT EXTRACTION    . CORONARY ARTERY BYPASS GRAFT  2008   x 4  . HIP FRACTURE SURGERY Left    pins placed w/o hip  replacement, hardware removed later  . LUMBAR DISC SURGERY     in her 77s  . tonsillectomy    . VAGINAL DELIVERY     x2    Family History  Problem Relation Age of Onset  . Heart disease Father   . Heart disease Sister   . Diabetes Sister    Social History:  reports that she quit smoking about 10 years ago. Her smoking use included Cigarettes. She has a 50.00 pack-year smoking history. She has never used smokeless tobacco. She reports that she does not drink alcohol or use drugs.  Allergies:  Allergies  Allergen Reactions  . Nsaids Other (See Comments)    Would avoid due to CKD.... Pt states she not allergic    Medications Prior to Admission  Medication Sig Dispense Refill  . acetaminophen (TYLENOL) 500 MG tablet Take 500 mg by mouth 2 (two) times daily.    Marland Kitchen amiodarone (PACERONE) 200 MG tablet Take 1 tablet (200 mg total) by mouth 2 (two) times daily. 70 tablet 3  . apixaban (ELIQUIS) 2.5 MG TABS tablet Take 1 tablet (2.5 mg total) by mouth 2 (two) times daily. 180 tablet 3  . atorvastatin (LIPITOR) 10 MG tablet Take 1 tablet (10 mg total) by mouth daily at 6 PM. 30 tablet 6  . cyanocobalamin 1000 MCG tablet Take 1,000 mcg by mouth daily.    Marland Kitchen Dextromethorphan-Benzocaine (CEPACOL SORE THROAT & COUGH) 5-7.5 MG LOZG Use as directed 1 lozenge in  the mouth or throat daily as needed (cough/ sore throat).    Marland Kitchen diltiazem (CARDIZEM CD) 240 MG 24 hr capsule Take 1 capsule (240 mg total) by mouth daily. 30 capsule 6  . diphenhydrAMINE (BENADRYL) 25 mg capsule Take 1 capsule (25 mg total) by mouth at bedtime as needed for sleep. 30 capsule 0  . docusate sodium (COLACE) 100 MG capsule Take 1 capsule (100 mg total) by mouth 2 (two) times daily as needed for mild constipation. 10 capsule 0  . furosemide (LASIX) 20 MG tablet Take 20 mg by mouth.    . insulin aspart (NOVOLOG) 100 UNIT/ML FlexPen Inject 0-10 Units into the skin 3 (three) times daily with meals. If blood sugar is 200-250, give 2  units. 251-300, give 4 units. 301-350, give 6 units.  351-400, give 8 units. 401-450, give 10 units. If blood sugar is greater than 450, call MD.    . insulin detemir (LEVEMIR) 100 UNIT/ML injection Inject 0.04 mLs (4 Units total) into the skin at bedtime. (Patient taking differently: Inject 13 Units into the skin at bedtime. ) 10 mL 11  . ipratropium-albuterol (DUONEB) 0.5-2.5 (3) MG/3ML SOLN Take 3 mLs by nebulization 4 (four) times daily. 360 mL   . levothyroxine (SYNTHROID, LEVOTHROID) 50 MCG tablet Take 50 mcg by mouth daily before breakfast.    . nitroGLYCERIN (NITROSTAT) 0.4 MG SL tablet Place 1 tablet (0.4 mg total) under the tongue every 5 (five) minutes as needed for chest pain. 25 tablet 6  . nystatin (MYCOSTATIN) 100000 UNIT/ML suspension Take 10 mLs by mouth 4 (four) times daily.    . ondansetron (ZOFRAN) 4 MG tablet Take 1 tablet (4 mg total) by mouth every 6 (six) hours as needed for nausea. 20 tablet 0  . oxyCODONE (OXY IR/ROXICODONE) 5 MG immediate release tablet Take 0.5 tablets (2.5 mg total) by mouth every 4 (four) hours as needed for moderate pain (or SOB). 30 tablet 0  . OXYGEN Inhale 3 L into the lungs continuous.    . traZODone (DESYREL) 50 MG tablet Take 0.5 tablets (25 mg total) by mouth at bedtime. 15 tablet 0    Results for orders placed or performed during the hospital encounter of 03/21/17 (from the past 48 hour(s))  CBC     Status: Abnormal   Collection Time: 03/21/17  1:23 AM  Result Value Ref Range   WBC 38.4 (H) 3.6 - 11.0 K/uL   RBC 4.16 3.80 - 5.20 MIL/uL   Hemoglobin 11.4 (L) 12.0 - 16.0 g/dL   HCT 35.0 35.0 - 47.0 %   MCV 84.1 80.0 - 100.0 fL   MCH 27.4 26.0 - 34.0 pg   MCHC 32.6 32.0 - 36.0 g/dL   RDW 15.2 (H) 11.5 - 14.5 %   Platelets 225 150 - 440 K/uL  Basic metabolic panel     Status: Abnormal   Collection Time: 03/21/17  2:09 AM  Result Value Ref Range   Sodium 120 (L) 135 - 145 mmol/L   Potassium 5.5 (H) 3.5 - 5.1 mmol/L   Chloride 87  (L) 101 - 111 mmol/L   CO2 27 22 - 32 mmol/L   Glucose, Bld 197 (H) 65 - 99 mg/dL   BUN 47 (H) 6 - 20 mg/dL   Creatinine, Ser 1.87 (H) 0.44 - 1.00 mg/dL   Calcium 8.3 (L) 8.9 - 10.3 mg/dL   GFR calc non Af Amer 23 (L) >60 mL/min   GFR calc Af Amer 26 (L) >60 mL/min  Comment: (NOTE) The eGFR has been calculated using the CKD EPI equation. This calculation has not been validated in all clinical situations. eGFR's persistently <60 mL/min signify possible Chronic Kidney Disease.    Anion gap 6 5 - 15  Brain natriuretic peptide     Status: Abnormal   Collection Time: 03/21/17  2:09 AM  Result Value Ref Range   B Natriuretic Peptide 226.0 (H) 0.0 - 100.0 pg/mL  Troponin I     Status: Abnormal   Collection Time: 03/21/17  2:09 AM  Result Value Ref Range   Troponin I 0.04 (HH) <0.03 ng/mL    Comment: CRITICAL RESULT CALLED TO, READ BACK BY AND VERIFIED WITH TERESA HUDSON ON 03/21/17 AT 0257 BY TLB    Dg Chest Port 1 View  Result Date: 03/21/2017 CLINICAL DATA:  Weakness and lightheadedness. Lower extremity edema. EXAM: PORTABLE CHEST 1 VIEW COMPARISON:  03/11/2017 FINDINGS: Pleural effusions bilaterally, increased from 03/11/2017. Diffuse interstitial fluid or thickening. Moderate vascular prominence. Central and basilar airspace opacities, right greater than left. IMPRESSION: The findings probably represent congestive heart failure with interstitial and alveolar edema as well as bilateral pleural effusions. Infectious infiltrates are not entirely excluded. Electronically Signed   By: Andreas Newport M.D.   On: 03/21/2017 01:52    Review of Systems  Constitutional: Negative for chills and fever.  HENT: Negative for sore throat and tinnitus.   Eyes: Negative for blurred vision and redness.  Respiratory: Negative for cough and shortness of breath.   Cardiovascular: Negative for chest pain, palpitations, orthopnea and PND.  Gastrointestinal: Negative for abdominal pain, diarrhea, nausea  and vomiting.  Genitourinary: Negative for dysuria, frequency and urgency.  Musculoskeletal: Negative for joint pain and myalgias.  Skin: Negative for rash.       No lesions  Neurological: Negative for speech change, focal weakness and weakness.  Endo/Heme/Allergies: Does not bruise/bleed easily.       No temperature intolerance  Psychiatric/Behavioral: Negative for depression and suicidal ideas.    Blood pressure (!) 118/50, pulse 61, temperature 97.3 F (36.3 C), temperature source Oral, resp. rate (!) 26, height '5\' 2"'$  (1.575 m), weight 63.5 kg (140 lb), SpO2 96 %. Physical Exam  Vitals reviewed. Constitutional: She is oriented to person, place, and time. She appears well-developed and well-nourished. No distress.  HENT:  Head: Normocephalic and atraumatic.  Mouth/Throat: Oropharynx is clear and moist.  Eyes: Conjunctivae and EOM are normal. Pupils are equal, round, and reactive to light. No scleral icterus.  Neck: Normal range of motion. No tracheal deviation present. No thyromegaly present.  Cardiovascular: Normal rate, regular rhythm and normal heart sounds.  Exam reveals no gallop and no friction rub.   No murmur heard. Respiratory: Effort normal. Tachypnea noted. She has rales in the right lower field and the left lower field.  GI: Soft. Bowel sounds are normal. She exhibits no distension. There is no tenderness.  Genitourinary:  Genitourinary Comments: Deferred  Musculoskeletal: She exhibits edema (3+ lower extremities bilaterally).  Lymphadenopathy:    She has no cervical adenopathy.  Neurological: She is alert and oriented to person, place, and time. No cranial nerve deficit. She exhibits normal muscle tone.  Skin: Skin is warm and dry.  Psychiatric: She has a normal mood and affect. Her behavior is normal. Judgment and thought content normal.     Assessment/Plan This is an 83 year old female admitted for heart failure is well as acute on chronic kidney failure. 1.  Congestive heart failure: Diastolic, chronic. It is  hard to call this an acute on chronic episode as the patient is clinically stable. This is essentially her status quo. As far as her edema she will likely become progressively worse as the patient's oncotic pressure declines due to malignancy. I have ordered Lasix which we will continue for today prior to resuming her home dose of diuretic tomorrow. Supplemental oxygen per home regimen. 2. Kidney failure: Acute on chronic; likely secondary to poor by mouth intake as well as prerenal failure. We'll have to balance diuresis with duration. Avoid nephrotoxic agents. 3. CAD: Stable. Troponins elevated on this may be due to poor renal clearance. Follow cardiac biomarkers. EKG shows atrial fibrillation. 4. Atrial fibrillation: Rate controlled; continue Eliquis. Amiodarone and diltiazem for rate control per home regimen 5. Lung cancer: Addressed as an outpatient and by palliative care. Hyponatremia secondary to lung mass 6. Leukocytosis: Dramatic although may be secondary to recently completed steroid taper. May also be secondary to malignant inflammatory state. Technically the patient and certainly meets criteria for sepsis although I do not suspect infectious cause of her current vital signs and laboratory values. Continue to monitor 7. Diabetes mellitus type 2: Continue basal insulin adjusted for hospital diet. Sliding scale insulin as well 8. Hyperlipidemia: Continue statin therapy 9. DVT prophylaxis: As above 10. GI prophylaxis: None The patient is a DO NOT RESUSCITATE. Time spent on admission orders and patient care approximately 45 minutes  Harrie Foreman, MD 03/21/2017, 6:54 AM

## 2017-03-21 NOTE — Clinical Social Work Note (Signed)
Clinical Social Work Assessment  Patient Details  Name: Donna Lane MRN: [id number] Date of Birth: Jun 14, 1925  Date of referral:  03/21/17               Reason for consult:  Facility Placement, Discharge Planning                Permission sought to share information with:  Family Supports Permission granted to share information::  Yes, Verbal Permission Granted  Name::     Cristal Generous  Relationship::  daughter  Contact Information:  [phone]  Housing/Transportation Living arrangements for the past 2 months:  [place] of Information:  Adult Children Patient Interpreter Needed:  None Criminal Activity/Legal Involvement Pertinent to Current Situation/Hospitalization:  No - Comment as needed Significant Relationships:  Adult Children Lives with:  Facility Resident Do you feel safe going back to the place where you live?  Yes Need for family participation in patient care:  No (Coment)  Care giving concerns:  No care giving concerns identified.  Social Worker assessment / plan:  CSW met with pt's daughter to address consult as pt was admitted from [facility]. CSW introduced herself and explained role of social work. Pt's daughter would like for her to return to [facility] at discharge. Palliative Care will continue to follow at facility. CSW updated pt's daughter, that facility is able to accept pt at discharge. CSW will continue to follow.   Employment status:  Retired Forensic scientist:  Medicare PT Recommendations:  Not assessed at this time Information / Referral to community resources:   ([facility])  Patient/Family's Response to care:  Pt's daughter was Patent attorney of CSW support.   Patient/Family's Understanding of and Emotional Response to Diagnosis, Current Treatment, and Prognosis:  Pt's daughter would like for pt to return to SNF for STR to build strength and then return home.   Emotional Assessment Appearance:  Appears  stated age Attitude/Demeanor/Rapport:   (sleeping) Affect (typically observed):   (sleeping) Orientation:  Oriented to Self, Oriented to Place, Oriented to  Time, Oriented to Situation Alcohol / Substance use:  Not Applicable Psych involvement (Current and /or in the community):  No (Comment)  Discharge Needs  Concerns to be addressed:  Adjustment to Illness Readmission within the last 30 days:  No Current discharge risk:  Chronically ill Barriers to Discharge:  Continued Medical Work up   [employer], LCSW 03/21/2017, 4:22 PM

## 2017-03-22 LAB — BASIC METABOLIC PANEL
ANION GAP: 8 (ref 5–15)
Anion gap: 8 (ref 5–15)
BUN: 46 mg/dL — ABNORMAL HIGH (ref 6–20)
BUN: 45 mg/dL — AB (ref 6–20)
CHLORIDE: 87 mmol/L — AB (ref 101–111)
CO2: 27 mmol/L (ref 22–32)
CO2: 26 mmol/L (ref 22–32)
Calcium: 8 mg/dL — ABNORMAL LOW (ref 8.9–10.3)
Calcium: 8.1 mg/dL — ABNORMAL LOW (ref 8.9–10.3)
Chloride: 86 mmol/L — ABNORMAL LOW (ref 101–111)
Creatinine, Ser: 1.96 mg/dL — ABNORMAL HIGH (ref 0.44–1.00)
Creatinine, Ser: 1.79 mg/dL — ABNORMAL HIGH (ref 0.44–1.00)
GFR calc Af Amer: 25 mL/min — ABNORMAL LOW (ref >60)
GFR calc Af Amer: 28 mL/min — ABNORMAL LOW (ref >60)
GFR calc non Af Amer: 21 mL/min — ABNORMAL LOW (ref >60)
GFR calc non Af Amer: 24 mL/min — ABNORMAL LOW (ref >60)
GLUCOSE: 178 mg/dL — AB (ref 65–99)
GLUCOSE: 179 mg/dL — AB (ref 65–99)
POTASSIUM: 5.7 mmol/L — AB (ref 3.5–5.1)
Potassium: 5.5 mmol/L — ABNORMAL HIGH (ref 3.5–5.1)
SODIUM: 121 mmol/L — AB (ref 135–145)
Sodium: 121 mmol/L — ABNORMAL LOW (ref 135–145)

## 2017-03-22 LAB — CBC
HEMATOCRIT: 31.4 % — AB (ref 35.0–47.0)
Hemoglobin: 10.5 g/dL — ABNORMAL LOW (ref 12.0–16.0)
MCH: 28.1 pg (ref 26.0–34.0)
MCHC: 33.6 g/dL (ref 32.0–36.0)
MCV: 83.7 fL (ref 80.0–100.0)
Platelets: 212 10*3/uL (ref 150–440)
RBC: 3.75 MIL/uL — ABNORMAL LOW (ref 3.80–5.20)
RDW: 15 % — ABNORMAL HIGH (ref 11.5–14.5)
WBC: 30.5 10*3/uL — ABNORMAL HIGH (ref 3.6–11.0)

## 2017-03-22 LAB — GLUCOSE, CAPILLARY
Glucose-Capillary: 188 mg/dL — ABNORMAL HIGH (ref 65–99)
Glucose-Capillary: 228 mg/dL — ABNORMAL HIGH (ref 65–99)
Glucose-Capillary: 210 mg/dL — ABNORMAL HIGH (ref 65–99)
Glucose-Capillary: 206 mg/dL — ABNORMAL HIGH (ref 65–99)

## 2017-03-22 LAB — POTASSIUM: Potassium: 5.4 mmol/L — ABNORMAL HIGH (ref 3.5–5.1)

## 2017-03-22 LAB — SODIUM
Sodium: 119 mmol/L — CL (ref 135–145)
Sodium: 121 mmol/L — ABNORMAL LOW (ref 135–145)

## 2017-03-22 MED ORDER — IPRATROPIUM-ALBUTEROL 0.5-2.5 (3) MG/3ML IN SOLN
3.0000 mL | Freq: Three times a day (TID) | RESPIRATORY_TRACT | Status: DC
  Administered 2017-03-22 – 2017-03-30 (×24): 3 mL via RESPIRATORY_TRACT
  Filled 2017-03-22 (×24): qty 3

## 2017-03-22 MED ORDER — FLUCONAZOLE IN SODIUM CHLORIDE 100-0.9 MG/50ML-% IV SOLN
100.0000 mg | INTRAVENOUS | Status: DC
  Administered 2017-03-22 – 2017-03-31 (×10): 100 mg via INTRAVENOUS
  Filled 2017-03-22 (×14): qty 50

## 2017-03-22 MED ORDER — TOLVAPTAN 15 MG PO TABS
15.0000 mg | ORAL_TABLET | Freq: Once | ORAL | Status: AC
  Administered 2017-03-22: 15 mg via ORAL
  Filled 2017-03-22 (×2): qty 1

## 2017-03-22 MED ORDER — PATIROMER SORBITEX CALCIUM 8.4 G PO PACK
16.8000 g | PACK | Freq: Once | ORAL | Status: AC
  Administered 2017-03-22: 16.8 g via ORAL
  Filled 2017-03-22: qty 4

## 2017-03-22 NOTE — Evaluation (Signed)
Clinical/Bedside Swallow Evaluation Patient Details  Name: Donna Lane MRN: [id number] Date of Birth: [birth year]  Today's Date: 03/22/2017 Time: SLP Start Time (ACUTE ONLY): 1150 SLP Stop Time (ACUTE ONLY): 1240 SLP Time Calculation (min) (ACUTE ONLY): 50 min  Past Medical History:  Past Medical History:  Diagnosis Date  . Arrhythmia    atrial fibrillation  . Chronic diastolic heart failure ([place])   . CKD (chronic kidney disease), stage III   . COPD (chronic obstructive pulmonary disease) ([place])   . Coronary artery disease 2008   CABG x 4,   . Diabetes mellitus   . Diverticulosis    on colonoscopy 08/12/2006  . Hyperlipidemia   . Hypertension   . Melanoma ([place])    removed by derm   . PVD (peripheral vascular disease) ([place])   . Spinal stenosis    Past Surgical History:  Past Surgical History:  Procedure Laterality Date  . CARDIAC CATHETERIZATION     PTCA x 3  . CARDIOVERSION N/A 03/07/2017   Procedure: Cardioversion;  Surgeon: Minna Merritts, MD;  Location: [facility] ORS;  Service: Cardiovascular;  Laterality: N/A;  . CATARACT EXTRACTION    . CORONARY ARTERY BYPASS GRAFT  2008   x 4  . HIP FRACTURE SURGERY Left    pins placed w/o hip replacement, hardware removed later  . LUMBAR DISC SURGERY     in her 59s  . tonsillectomy    . VAGINAL DELIVERY     x2   HPI:  The patient was discharged from the hospital one week ago following admission for pneumonia returns to the emergency department from her nursing home due to weeping lower extremities. The patient states that she is weak and that her legs are heavy 2 left. She denies other complaints although she is clearly short of breath. The patient is well-known to our service and has been in denial about her diagnosis of lung cancer. Upon admission last time the patient also denies shortness of breath however chest x-ray in the emergency department shows vascular congestion consistent with congestive heart failure. The patient's  respiratory rate is elevated as well. She denies chest pain. Past medical history significant for atrial fibrillation, coronary artery disease status post CABG and diabetes. Pt reporting to nsg and MD that she is "unable" to swallow.    Assessment / Plan / Recommendation Clinical Impression  Pt presents w/no overt s/s of aspiration w/any tested consistency, however c/o 10/10 pain when swallowing all consistencies, except for warm coffee. Pt given trials of ice chips, thin liquids, puree, and solid. Pt demonstrated intermittent coughing before, during and after trials. Do not believe that cough was related to PO intake as it did not appear to increase with trials. Laryngeal elevation appeared adequate and vocal quaity remained clear throughout. Pt stated she was unable to swallow, however functionally swallow appeared [facility]. Pt did c/o significant pain in throat when she swallowed and also was noted to grimace and shake her head while swallowing. Pt reported that warmer liquids did feel better as she swallowed. Pt is judged to be mild risk of aspiration d/t pain distraction and current respiratory status. Oral mech exam revealed oral motor abilities to be [facility]. Recommend downgrading diet to Dysphagia III for ease of swallowing and only offering warm or room temperature items. Informed nsg and MD about pt's c/o pain. Will f/u in 1-2 days re: toleration of diet.  SLP Visit Diagnosis: Dysphagia, unspecified (R13.10)    Aspiration Risk  Mild aspiration risk  Diet Recommendation Dysphagia 3 (Mech soft);Thin liquid;Other (Comment) (Warm or room temperature PO's)   Liquid Administration via: Cup Medication Administration: Whole meds with puree Supervision: Staff to assist with self feeding Compensations: Minimize environmental distractions;Slow rate;Small sips/bites Postural Changes: Seated upright at 90 degrees;Remain upright for at least 30 minutes after po intake    Other  Recommendations Oral Care  Recommendations: Oral care BID   Follow up Recommendations Skilled Nursing facility      Frequency and Duration min 3x week  1 week       Prognosis Prognosis for Safe Diet Advancement: Fair      Swallow Study   General Date of Onset: 03/21/17 HPI: The patient was discharged from the hospital one week ago following admission for pneumonia returns to the emergency department from her nursing home due to weeping lower extremities. The patient states that she is weak and that her legs are heavy 2 left. She denies other complaints although she is clearly short of breath. The patient is well-known to our service and has been in denial about her diagnosis of lung cancer. Upon admission last time the patient also denies shortness of breath however chest x-ray in the emergency department shows vascular congestion consistent with congestive heart failure. The patient's respiratory rate is elevated as well. She denies chest pain. Past medical history significant for atrial fibrillation, coronary artery disease status post CABG and diabetes. Pt reporting to nsg and MD that she is "unable" to swallow.  Type of Study: Bedside Swallow Evaluation Previous Swallow Assessment: none reported Diet Prior to this Study: Regular;Thin liquids Temperature Spikes Noted: No Respiratory Status: Nasal cannula History of Recent Intubation: No Behavior/Cognition: Alert;Cooperative Oral Cavity Assessment: Dry Oral Care Completed by SLP: No Oral Cavity - Dentition: Dentures, top;Dentures, bottom Vision: Functional for self-feeding Self-Feeding Abilities: Able to feed self;Needs assist Patient Positioning: Upright in bed Baseline Vocal Quality: Normal Volitional Cough: Strong Volitional Swallow: Able to elicit    Oral/Motor/Sensory Function Overall Oral Motor/Sensory Function: Within functional limits   Ice Chips Ice chips: Within functional limits Presentation: Spoon Other Comments: 1 tsp   Thin Liquid Thin  Liquid: Within functional limits Presentation: Cup;Self Fed (Some assistance) Other Comments: 7 sips of liquid. Pt demonstrated intermittent coughing with and without PO intake.     Nectar Thick Nectar Thick Liquid: Not tested   Honey Thick Honey Thick Liquid: Not tested   Puree Puree: Within functional limits Presentation: Spoon Other Comments: 2 tsps   Solid   GO   Solid: Within functional limits Other Comments: 1 bite of solid cracker        [place],Maaran 03/22/2017,12:40 PM

## 2017-03-22 NOTE — Progress Notes (Signed)
Dr. Leslye Peer notified of patient lab sodium 119 and potassium, no new orders recieved

## 2017-03-22 NOTE — Progress Notes (Signed)
Patient ID: Donna Lane, female   DOB: 10-24-1925, 83 y.o.   MRN: [id number]   [facility] PROGRESS NOTE  Donna Lane JYN:[id number] DOB: 12-13-1925 DOA: 03/21/2017 PCP: [name], MD  HPI/Subjective: Patient again states she feels rough. She states that she is having burning all over her body. Hurts when she swallows. Still short of breath and coughing.  Objective: Vitals:   03/22/17 0919 03/22/17 1220  BP: 125/71 (!) 112/46  Pulse: 78 65  Resp: 18 (!) 24  Temp: 98.3 F (36.8 C) 98 F (36.7 C)    Filed Weights   03/21/17 0054 03/22/17 0628  Weight: 63.5 kg (140 lb) 57.2 kg (126 lb)    ROS: Review of Systems  Constitutional: Negative for chills and fever.  Eyes: Negative for blurred vision.  Respiratory: Positive for cough and shortness of breath.   Cardiovascular: Negative for chest pain.  Gastrointestinal: Negative for abdominal pain, constipation, diarrhea, nausea and vomiting.  Genitourinary: Negative for dysuria.  Musculoskeletal: Negative for joint pain.  Neurological: Positive for weakness. Negative for dizziness and headaches.   Exam: Physical Exam  Constitutional: She is oriented to person, place, and time.  HENT:  Nose: No mucosal edema.  Mouth/Throat: No oropharyngeal exudate or posterior oropharyngeal edema.  Eyes: Conjunctivae, EOM and lids are normal. Pupils are equal, round, and reactive to light.  Neck: No JVD present. Carotid bruit is not present. No edema present. No thyroid mass and no thyromegaly present.  Cardiovascular: S1 normal and S2 normal.  Exam reveals no gallop.   No murmur heard. Pulses:      Dorsalis pedis pulses are 2+ on the right side, and 2+ on the left side.  Respiratory: No respiratory distress. She has decreased breath sounds in the right middle field, the right lower field, the left middle field and the left lower field. She has no wheezes. She has rhonchi in the right middle field and the left middle field.  She has rales in the right lower field.  GI: Soft. Bowel sounds are normal. There is no tenderness.  Musculoskeletal:       Right wrist: She exhibits swelling.       Right ankle: She exhibits swelling.       Left ankle: She exhibits swelling.  Lymphadenopathy:    She has no cervical adenopathy.  Neurological: She is alert and oriented to person, place, and time. No cranial nerve deficit.  Skin: Skin is warm. No rash noted. Nails show no clubbing.  Unna boots bilateral lower extremities  Psychiatric: She has a normal mood and affect.      Data Reviewed: Basic Metabolic Panel:  Recent Labs Lab 03/21/17 0209 03/22/17 0327 03/22/17 0742  NA 120* 121* 121*  K 5.5* 5.5* 5.7*  CL 87* 86* 87*  CO2 '27 27 26  '$ GLUCOSE 197* 178* 179*  BUN 47* 46* 45*  CREATININE 1.87* 1.96* 1.79*  CALCIUM 8.3* 8.0* 8.1*   CBC:  Recent Labs Lab 03/21/17 0123 03/22/17 0327  WBC 38.4* 30.5*  HGB 11.4* 10.5*  HCT 35.0 31.4*  MCV 84.1 83.7  PLT 225 212   Cardiac Enzymes:  Recent Labs Lab 03/21/17 0209 03/21/17 1014 03/21/17 1341  TROPONINI 0.04* 0.29* 0.06*   BNP (last 3 results)  Recent Labs  01/06/17 1044 03/07/17 2238 03/21/17 0209  BNP 362.0* 182.0* 226.0*      Studies: Dg Chest Port 1 View  Result Date: 03/21/2017 CLINICAL DATA:  Weakness and lightheadedness. Lower extremity edema. EXAM:  PORTABLE CHEST 1 VIEW COMPARISON:  03/11/2017 FINDINGS: Pleural effusions bilaterally, increased from 03/11/2017. Diffuse interstitial fluid or thickening. Moderate vascular prominence. Central and basilar airspace opacities, right greater than left. IMPRESSION: The findings probably represent congestive heart failure with interstitial and alveolar edema as well as bilateral pleural effusions. Infectious infiltrates are not entirely excluded. Electronically Signed   By: Andreas Newport M.D.   On: 03/21/2017 01:52    Scheduled Meds: . amiodarone  200 mg Oral BID  . apixaban  2.5 mg Oral  BID  . atorvastatin  10 mg Oral q1800  . azithromycin  250 mg Oral Daily  . budesonide (PULMICORT) nebulizer solution  0.5 mg Nebulization BID  . cefTRIAXone (ROCEPHIN)  IV  1 g Intravenous Q24H  . diltiazem  240 mg Oral Daily  . docusate sodium  100 mg Oral BID  . fluconazole (DIFLUCAN) IV  100 mg Intravenous Q24H  . insulin aspart  0-5 Units Subcutaneous QHS  . insulin aspart  0-9 Units Subcutaneous TID WC  . ipratropium-albuterol  3 mL Nebulization TID  . levothyroxine  50 mcg Oral QAC breakfast  . mouth rinse  15 mL Mouth Rinse BID  . traZODone  25 mg Oral QHS  . cyanocobalamin  1,000 mcg Oral Daily    Assessment/Plan:  1. Acute diastolic congestive heart failure With hyponatremia. Hold Lasix today and give 1 dose of Tolvaptan and monitor sodiums. Likely restart Lasix tomorrow 2. Acute kidney injury on chronic kidney disease stage III. Monitor with diuresis 3. Hyperkalemia. 1 dose of Veltassa today and recheck potassium later today. 4. Atrial fibrillation. Rate controlled with amiodarone. Anticoagulation with eliquis 5. History of lung cancer 6. History of hyponatremia. 1 dose of Tolvaptan today 7. Type 2 diabetes mellitus on sliding scale 8. Hyperlipidemia on statin 9. Hypothermia, possible pneumonia. On Rocephin and Zithromax. Temperature came back up. 10. Poor air entry start nebulizer treatments with budesonide 11. Thrush and difficulty swallowing. Switch nystatin swish and swallow over to IV Diflucan. 12. Speech therapy downgraded diet 13. Elevated troponin demand ischemia from CHF 14. Will likely need palliative care consultation on Monday   Code Status:     Code Status Orders        Start     Ordered   03/21/17 0732  Do not attempt resuscitation (DNR)  Continuous    Question Answer Comment  In the event of cardiac or respiratory ARREST Do not call a "code blue"   In the event of cardiac or respiratory ARREST Do not perform Intubation, CPR, defibrillation or  ACLS   In the event of cardiac or respiratory ARREST Use medication by any route, position, wound care, and other measures to relive pain and suffering. May use oxygen, suction and manual treatment of airway obstruction as needed for comfort.      03/21/17 0731    Code Status History    Date Active Date Inactive Code Status Order ID Comments User Context   03/13/2017  9:43 AM 03/14/2017 12:09 AM DNR [id number]  Melton Alar, PA-C Inpatient   03/08/2017  2:15 AM 03/13/2017  9:43 AM Partial Code [id number]  Harrie Foreman, MD ED   01/07/2017  7:26 PM 01/09/2017  5:42 PM Partial Code [id number]  Vaughan Basta, MD Inpatient   01/06/2017  3:21 PM 01/07/2017  7:26 PM Full Code [id number]  Epifanio Lesches, MD ED   11/28/2016  7:40 PM 11/29/2016  3:44 PM Full Code [id number]  Vaughan Basta, MD Inpatient  Advance Directive Documentation     Most Recent Value  Type of Advance Directive  Healthcare Power of Attorney  Pre-existing out of facility DNR order (yellow form or pink MOST form)  -  "MOST" Form in Place?  -     Family Communication: Grandson at the bedside Disposition Plan: To be determined  Antibiotics:  Rocephin  Zithromax  Time spent: 26 minutes  Loletha Grayer  [employer]

## 2017-03-23 ENCOUNTER — Inpatient Hospital Stay: Payer: Medicare Other

## 2017-03-23 LAB — BASIC METABOLIC PANEL
ANION GAP: 10 (ref 5–15)
Anion gap: 10 (ref 5–15)
BUN: 45 mg/dL — AB (ref 6–20)
BUN: 47 mg/dL — ABNORMAL HIGH (ref 6–20)
CALCIUM: 8.4 mg/dL — AB (ref 8.9–10.3)
CALCIUM: 8.1 mg/dL — AB (ref 8.9–10.3)
CHLORIDE: 85 mmol/L — AB (ref 101–111)
CHLORIDE: 86 mmol/L — AB (ref 101–111)
CO2: 27 mmol/L (ref 22–32)
CO2: 29 mmol/L (ref 22–32)
CREATININE: 1.73 mg/dL — AB (ref 0.44–1.00)
CREATININE: 1.77 mg/dL — AB (ref 0.44–1.00)
GFR calc non Af Amer: 25 mL/min — ABNORMAL LOW (ref >60)
GFR calc non Af Amer: 24 mL/min — ABNORMAL LOW (ref >60)
GFR, EST AFRICAN AMERICAN: 29 mL/min — AB (ref >60)
GFR, EST AFRICAN AMERICAN: 28 mL/min — AB (ref >60)
GLUCOSE: 215 mg/dL — AB (ref 65–99)
Glucose, Bld: 261 mg/dL — ABNORMAL HIGH (ref 65–99)
Potassium: 5.7 mmol/L — ABNORMAL HIGH (ref 3.5–5.1)
Potassium: 4.2 mmol/L (ref 3.5–5.1)
SODIUM: 125 mmol/L — AB (ref 135–145)
Sodium: 122 mmol/L — ABNORMAL LOW (ref 135–145)

## 2017-03-23 LAB — GLUCOSE, CAPILLARY
Glucose-Capillary: 222 mg/dL — ABNORMAL HIGH (ref 65–99)
Glucose-Capillary: 185 mg/dL — ABNORMAL HIGH (ref 65–99)
Glucose-Capillary: 254 mg/dL — ABNORMAL HIGH (ref 65–99)
Glucose-Capillary: 229 mg/dL — ABNORMAL HIGH (ref 65–99)

## 2017-03-23 MED ORDER — ENOXAPARIN SODIUM 60 MG/0.6ML ~~LOC~~ SOLN
1.0000 mg/kg | SUBCUTANEOUS | Status: DC
  Administered 2017-03-23: 55 mg via SUBCUTANEOUS
  Filled 2017-03-23 (×2): qty 0.6

## 2017-03-23 MED ORDER — FUROSEMIDE 10 MG/ML IJ SOLN
60.0000 mg | Freq: Two times a day (BID) | INTRAMUSCULAR | Status: DC
  Administered 2017-03-23 – 2017-04-01 (×19): 60 mg via INTRAVENOUS
  Filled 2017-03-23 (×19): qty 6

## 2017-03-23 MED ORDER — SODIUM POLYSTYRENE SULFONATE 15 GM/60ML PO SUSP
30.0000 g | Freq: Once | ORAL | Status: AC
  Administered 2017-03-23: 30 g via ORAL
  Filled 2017-03-23: qty 120

## 2017-03-23 MED ORDER — FUROSEMIDE 10 MG/ML IJ SOLN
100.0000 mg | Freq: Once | INTRAMUSCULAR | Status: AC
  Administered 2017-03-23: 100 mg via INTRAVENOUS
  Filled 2017-03-23: qty 12

## 2017-03-23 MED ORDER — METHYLPREDNISOLONE SODIUM SUCC 40 MG IJ SOLR
40.0000 mg | Freq: Every day | INTRAMUSCULAR | Status: DC
  Administered 2017-03-23 – 2017-04-04 (×13): 40 mg via INTRAVENOUS
  Filled 2017-03-23 (×13): qty 1

## 2017-03-23 NOTE — Progress Notes (Signed)
ANTICOAGULATION CONSULT NOTE - Initial Consult  Pharmacy Consult for enoxaparin treatment dose Indication: atrial fibrillation  Allergies  Allergen Reactions  . Nsaids Other (See Comments)    Would avoid due to CKD.... Pt states she not allergic    Patient Measurements: Height: '5\' 2"'$  (157.5 cm) Weight: 116 lb (52.6 kg) IBW/kg (Calculated) : 50.1  Vital Signs: Temp: 97.6 F (36.4 C) (04/08 0453) Temp Source: Oral (04/08 0453) BP: 98/68 (04/08 0453) Pulse Rate: 80 (04/08 0453)  Labs:  Recent Labs  03/21/17 0123  03/21/17 0209 03/21/17 1014 03/21/17 1341 03/22/17 0327 03/22/17 0742 03/23/17 0434  HGB 11.4*  --   --   --   --  10.5*  --   --   HCT 35.0  --   --   --   --  31.4*  --   --   PLT 225  --   --   --   --  212  --   --   CREATININE  --   < > 1.87*  --   --  1.96* 1.79* 1.73*  TROPONINI  --   --  0.04* 0.29* 0.06*  --   --   --   < > = values in this interval not displayed.  Estimated Creatinine Clearance: 17.4 mL/min (A) (by C-G formula based on SCr of 1.73 mg/dL (H)).   Medical History: Past Medical History:  Diagnosis Date  . Arrhythmia    atrial fibrillation  . Chronic diastolic heart failure ([place])   . CKD (chronic kidney disease), stage III   . COPD (chronic obstructive pulmonary disease) ([place])   . Coronary artery disease 2008   CABG x 4,   . Diabetes mellitus   . Diverticulosis    on colonoscopy 08/12/2006  . Hyperlipidemia   . Hypertension   . Melanoma ([place])    removed by derm   . PVD (peripheral vascular disease) ([place])   . Spinal stenosis     Assessment: Patient was taking apixaban PTA and has been receiving while inpatient. Anticoagulation being changed for possible thoracentesis.   Plan:  Enoxaparin 1 mg/kg subQ daily starting tonight CBC and SCr to be checked at least every 72 hours per protocol  Lenis Noon, PharmD Clinical Pharmacist 03/23/2017,12:24 PM

## 2017-03-23 NOTE — Progress Notes (Signed)
Patient ID: Donna Lane, female   DOB: 1925/11/16, 83 y.o.   MRN: [id number]   [facility] PROGRESS NOTE  Donna Lane GLO:[id number] DOB: Oct 10, 1925 DOA: 03/21/2017 PCP: Donna Stain, MD  HPI/Subjective: Patient feeling rough. Shortness of breath worsened overnight. Cough but nonproductive. Feeling some nausea  Objective: Vitals:   03/22/17 2101 03/23/17 0453  BP: (!) 127/41 98/68  Pulse: 74 80  Resp: (!) 32 (!) 24  Temp: 98.1 F (36.7 C) 97.6 F (36.4 C)    Filed Weights   03/21/17 0054 03/22/17 0628 03/23/17 0453  Weight: 63.5 kg (140 lb) 57.2 kg (126 lb) 52.6 kg (116 lb)    ROS: Review of Systems  Constitutional: Negative for chills and fever.  Eyes: Negative for blurred vision.  Respiratory: Positive for cough and shortness of breath.   Cardiovascular: Negative for chest pain.  Gastrointestinal: Positive for nausea. Negative for abdominal pain, constipation, diarrhea and vomiting.  Genitourinary: Negative for dysuria.  Musculoskeletal: Negative for joint pain.  Neurological: Positive for weakness. Negative for dizziness and headaches.   Exam: Physical Exam  Constitutional: She is oriented to person, place, and time.  HENT:  Nose: No mucosal edema.  Mouth/Throat: No oropharyngeal exudate or posterior oropharyngeal edema.  Eyes: Conjunctivae, EOM and lids are normal. Pupils are equal, round, and reactive to light.  Neck: No JVD present. Carotid bruit is not present. No edema present. No thyroid mass and no thyromegaly present.  Cardiovascular: S1 normal and S2 normal.  Exam reveals no gallop.   No murmur heard. Pulses:      Dorsalis pedis pulses are 2+ on the right side, and 2+ on the left side.  Respiratory: No respiratory distress. She has decreased breath sounds in the right middle field, the right lower field, the left middle field and the left lower field. She has no wheezes. She has rhonchi in the right middle field and the left middle field.  She has rales in the right lower field.  GI: Soft. Bowel sounds are normal. There is no tenderness.  Musculoskeletal:       Right wrist: She exhibits swelling.       Right ankle: She exhibits swelling.       Left ankle: She exhibits swelling.  Lymphadenopathy:    She has no cervical adenopathy.  Neurological: She is alert and oriented to person, place, and time. No cranial nerve deficit.  Skin: Skin is warm. No rash noted. Nails show no clubbing.  Unna boots bilateral lower extremities  Psychiatric: She has a normal mood and affect.      Data Reviewed: Basic Metabolic Panel:  Recent Labs Lab 03/21/17 0209 03/22/17 0327 03/22/17 0742 03/22/17 1547 03/22/17 2251 03/23/17 0434  NA 120* 121* 121* 119* 121* 122*  K 5.5* 5.5* 5.7* 5.4*  --  5.7*  CL 87* 86* 87*  --   --  85*  CO2 '27 27 26  '$ --   --  27  GLUCOSE 197* 178* 179*  --   --  215*  BUN 47* 46* 45*  --   --  45*  CREATININE 1.87* 1.96* 1.79*  --   --  1.73*  CALCIUM 8.3* 8.0* 8.1*  --   --  8.4*   CBC:  Recent Labs Lab 03/21/17 0123 03/22/17 0327  WBC 38.4* 30.5*  HGB 11.4* 10.5*  HCT 35.0 31.4*  MCV 84.1 83.7  PLT 225 212   Cardiac Enzymes:  Recent Labs Lab 03/21/17 0209 03/21/17 1014 03/21/17  1341  TROPONINI 0.04* 0.29* 0.06*   BNP (last 3 results)  Recent Labs  01/06/17 1044 03/07/17 2238 03/21/17 0209  BNP 362.0* 182.0* 226.0*      Studies: Dg Chest Port 1 View  Result Date: 03/23/2017 CLINICAL DATA:  83 y/o  F; cough.  History of lung cancer. EXAM: PORTABLE CHEST 1 VIEW COMPARISON:  03/21/2017 chest radiograph. FINDINGS: Stable cardiomegaly partially obscured by large right effusion. Aortic atherosclerosis with calcification. Post CABG with sternotomy wires and alignment. Large right and small left pleural effusions, probably increased on the right from prior chest radiograph. Right mid and lower lung zone opacification. IMPRESSION: Large right and small left pleural effusions probably  increased on the right in comparison with prior chest radiograph. Opacification of right mid and lower lung zones and left lower lobe may represent atelectasis or pneumonia. Right lung mass is obscured by the effusion. Electronically Signed   By: Donna Lane M.D.   On: 03/23/2017 05:53    Scheduled Meds: . amiodarone  200 mg Oral BID  . atorvastatin  10 mg Oral q1800  . azithromycin  250 mg Oral Daily  . budesonide (PULMICORT) nebulizer solution  0.5 mg Nebulization BID  . cefTRIAXone (ROCEPHIN)  IV  1 g Intravenous Q24H  . diltiazem  240 mg Oral Daily  . docusate sodium  100 mg Oral BID  . fluconazole (DIFLUCAN) IV  100 mg Intravenous Q24H  . furosemide  60 mg Intravenous BID  . insulin aspart  0-5 Units Subcutaneous QHS  . insulin aspart  0-9 Units Subcutaneous TID WC  . ipratropium-albuterol  3 mL Nebulization TID  . levothyroxine  50 mcg Oral QAC breakfast  . mouth rinse  15 mL Mouth Rinse BID  . methylPREDNISolone (SOLU-MEDROL) injection  40 mg Intravenous Daily  . traZODone  25 mg Oral QHS  . cyanocobalamin  1,000 mcg Oral Daily    Assessment/Plan:  1. Acute diastolic congestive heart failure With hyponatremia. Lasix 60 mg IV twice a day. Sodium did not improve much with the tolvaptan. 2. Enlarging right pleural effusion. Switch anticoagulation to Lovenox injections. Potentially be able to get a thoracentesis on Tuesday. 3. Acute kidney injury on chronic kidney disease stage III. Monitor with diuresis 4. Hyperkalemia. Kayexalate given this morning. 5. Atrial fibrillation. Rate controlled with amiodarone. Hold eliquis and give Lovenox 6. History of lung cancer obstructing mass blocking the right bronchus. Overall prognosis poor. Initial thoracentesis negative for malignancy. Too sick right now for bronchoscopy 7. History of hyponatremia. Sodium up slightly at 122 8. Type 2 diabetes mellitus on sliding scale 9. Hyperlipidemia on statin 10. Hypothermia, possible  pneumonia. On Rocephin and Zithromax. Temperature came back up. 11. Poor air entry start nebulizer treatments with budesonide. And Solu-Medrol 12. Thrush and difficulty swallowing. IV Diflucan. Speech therapy downgraded diet 13. Elevated troponin demand ischemia from CHF 14. palliative care consultation for Monday   Code Status:     Code Status Orders        Start     Ordered   03/21/17 0732  Do not attempt resuscitation (DNR)  Continuous    Question Answer Comment  In the event of cardiac or respiratory ARREST Do not call a "code blue"   In the event of cardiac or respiratory ARREST Do not perform Intubation, CPR, defibrillation or ACLS   In the event of cardiac or respiratory ARREST Use medication by any route, position, wound care, and other measures to relive pain and suffering. May use oxygen,  suction and manual treatment of airway obstruction as needed for comfort.      03/21/17 0731    Code Status History    Date Active Date Inactive Code Status Order ID Comments User Context   03/13/2017  9:43 AM 03/14/2017 12:09 AM DNR [id number]  Melton Alar, PA-C Inpatient   03/08/2017  2:15 AM 03/13/2017  9:43 AM Partial Code [id number]  Harrie Foreman, MD ED   01/07/2017  7:26 PM 01/09/2017  5:42 PM Partial Code [id number]  Vaughan Basta, MD Inpatient   01/06/2017  3:21 PM 01/07/2017  7:26 PM Full Code [id number]  Epifanio Lesches, MD ED   11/28/2016  7:40 PM 11/29/2016  3:44 PM Full Code [id number]  Vaughan Basta, MD Inpatient    Advance Directive Documentation     Most Recent Value  Type of Advance Directive  Healthcare Power of Attorney  Pre-existing out of facility DNR order (yellow form or pink MOST form)  -  "MOST" Form in Place?  -     Family Communication: Daughter on the phone Disposition Plan: To be determined  Antibiotics:  Rocephin  Zithromax  Time spent: 26 minutes  Loletha Grayer  [employer]

## 2017-03-24 LAB — GLUCOSE, CAPILLARY
Glucose-Capillary: 209 mg/dL — ABNORMAL HIGH (ref 65–99)
Glucose-Capillary: 246 mg/dL — ABNORMAL HIGH (ref 65–99)
Glucose-Capillary: 272 mg/dL — ABNORMAL HIGH (ref 65–99)
Glucose-Capillary: 260 mg/dL — ABNORMAL HIGH (ref 65–99)

## 2017-03-24 LAB — BASIC METABOLIC PANEL
Anion gap: 10 (ref 5–15)
BUN: 45 mg/dL — AB (ref 6–20)
CALCIUM: 8.1 mg/dL — AB (ref 8.9–10.3)
CO2: 30 mmol/L (ref 22–32)
Chloride: 86 mmol/L — ABNORMAL LOW (ref 101–111)
Creatinine, Ser: 1.65 mg/dL — ABNORMAL HIGH (ref 0.44–1.00)
GFR calc Af Amer: 31 mL/min — ABNORMAL LOW (ref >60)
GFR, EST NON AFRICAN AMERICAN: 26 mL/min — AB (ref >60)
GLUCOSE: 192 mg/dL — AB (ref 65–99)
POTASSIUM: 4 mmol/L (ref 3.5–5.1)
Sodium: 126 mmol/L — ABNORMAL LOW (ref 135–145)

## 2017-03-24 LAB — CBC
HEMATOCRIT: 32.9 % — AB (ref 35.0–47.0)
Hemoglobin: 10.7 g/dL — ABNORMAL LOW (ref 12.0–16.0)
MCH: 27.8 pg (ref 26.0–34.0)
MCHC: 32.7 g/dL (ref 32.0–36.0)
MCV: 85 fL (ref 80.0–100.0)
Platelets: 202 10*3/uL (ref 150–440)
RBC: 3.86 MIL/uL (ref 3.80–5.20)
RDW: 15.4 % — AB (ref 11.5–14.5)
WBC: 17.4 10*3/uL — ABNORMAL HIGH (ref 3.6–11.0)

## 2017-03-24 MED ORDER — DAKINS (1/4 STRENGTH) 0.125 % EX SOLN
Freq: Every day | CUTANEOUS | Status: AC
  Administered 2017-03-24 – 2017-03-26 (×2)
  Filled 2017-03-24: qty 473

## 2017-03-24 MED ORDER — ZINC OXIDE 12.8 % EX OINT
TOPICAL_OINTMENT | CUTANEOUS | Status: DC | PRN
  Filled 2017-03-24: qty 56.7

## 2017-03-24 NOTE — Discharge Instructions (Signed)
Heart Failure Clinic appointment on April 07, 2017 at 11:00am with Darylene Price, [place]. Please call [phone] to reschedule.

## 2017-03-24 NOTE — Consult Note (Signed)
Consultation Note Date: 03/24/2017   Patient Name: Donna Lane  DOB: February 28, 1925  MRN: [id number]  Age / Sex: 83 y.o., female  PCP: Tonia Ghent, MD Referring Physician: Loletha Grayer, MD  Reason for Consultation: Establishing goals of care  HPI/Patient Profile: 83 yo female with CAD, diastolic CHF, PVD, CKD III, DMII, diverticulosis, HTN, HLD, melanoma, spinal stenosis and pleural effusions (previous cytology benign) with lung mass obstructing right bronchus.    Clinical Assessment and Goals of Care: Spoke with Ms. Berhe and her son, daughter-in-law at bedside. They believe since her previous cytology from thoracentesis did not show malignancy that she does not have cancer. I spent some time discussing with the son why this is not necessarily reassuring. We reviewed CXRs and CT chest radiologist impression from December. He is still skeptical that this is cancer and even makes comments to his mother that we know this is not cancer. I educated that even though the cytology He does say that he does not believe she would want any treatment for this anyway. We did discuss potential of PleurX drain if fluid reacculumated again. Will need continued education. Will speak more with patient and her daughter/HCPOA [name] tomorrow.   Per notes patient is also VERY against hospice d/t past experience with a family member - we did not discuss hospice today. She is very weak and debilitated and this will likely make disposition difficult.   Primary Decision Maker HCPOA dtr [name]    SUMMARY OF RECOMMENDATIONS   - DNR - Much education needed for pt/family as far as her diagnosis and prognosis  Code Status/Advance Care Planning:  DNR   Symptom Management:   Poor intake: Treat thrush. Continue Premier Protein per dietician recommendations.   Ms. Younan does not desire invasive and aggressive  measures. Open to possible radiation but no chemo per past notes.   Palliative Prophylaxis:   Aspiration, Bowel Regimen, Delirium Protocol and Frequent Pain Assessment  Additional Recommendations (Limitations, Scope, Preferences):  No Tracheostomy  Psycho-social/Spiritual:   Desire for further Chaplaincy support:no  Additional Recommendations: Caregiving  Support/Resources and Education on Hospice  Prognosis:   Weeks to months  Discharge Planning: To Be Determined      Primary Diagnoses: Present on Admission: . Acute on chronic systolic CHF (congestive heart failure) ([place])   I have reviewed the medical record, interviewed the patient and family, and examined the patient. The following aspects are pertinent.  Past Medical History:  Diagnosis Date  . Arrhythmia    atrial fibrillation  . Chronic diastolic heart failure ([place])   . CKD (chronic kidney disease), stage III   . COPD (chronic obstructive pulmonary disease) ([place])   . Coronary artery disease 2008   CABG x 4,   . Diabetes mellitus   . Diverticulosis    on colonoscopy 08/12/2006  . Hyperlipidemia   . Hypertension   . Melanoma ([place])    removed by derm   . PVD (peripheral vascular disease) ([place])   . Spinal stenosis    Social  History   Social History  . Marital status: Married    Spouse name: N/A  . Number of children: N/A  . Years of education: N/A   Social History Main Topics  . Smoking status: Former Smoker    Packs/day: 1.00    Years: 50.00    Types: Cigarettes    Quit date: 03/20/2007  . Smokeless tobacco: Never Used  . Alcohol use No  . Drug use: No  . Sexual activity: No   Other Topics Concern  . None   Social History Narrative   [place] [place]    Married 1953   2 kids, local.    Retired.    Family History  Problem Relation Age of Onset  . Heart disease Father   . Heart disease Sister   . Diabetes Sister    Scheduled Meds: . amiodarone  200 mg Oral BID  . atorvastatin  10  mg Oral q1800  . azithromycin  250 mg Oral Daily  . budesonide (PULMICORT) nebulizer solution  0.5 mg Nebulization BID  . cefTRIAXone (ROCEPHIN)  IV  1 g Intravenous Q24H  . docusate sodium  100 mg Oral BID  . fluconazole (DIFLUCAN) IV  100 mg Intravenous Q24H  . furosemide  60 mg Intravenous BID  . insulin aspart  0-5 Units Subcutaneous QHS  . insulin aspart  0-9 Units Subcutaneous TID WC  . ipratropium-albuterol  3 mL Nebulization TID  . levothyroxine  50 mcg Oral QAC breakfast  . mouth rinse  15 mL Mouth Rinse BID  . methylPREDNISolone (SOLU-MEDROL) injection  40 mg Intravenous Daily  . sodium hypochlorite   Irrigation Daily  . traZODone  25 mg Oral QHS  . cyanocobalamin  1,000 mcg Oral Daily   Continuous Infusions: PRN Meds:.acetaminophen **OR** acetaminophen, antiseptic oral rinse, diphenhydrAMINE, menthol-cetylpyridinium, nitroGLYCERIN, ondansetron **OR** ondansetron (ZOFRAN) IV, oxyCODONE Allergies  Allergen Reactions  . Nsaids Other (See Comments)    Would avoid due to CKD.... Pt states she not allergic   Review of Systems  Constitutional: Positive for activity change, appetite change and fatigue.  Respiratory: Positive for shortness of breath.   Neurological: Positive for weakness.    Physical Exam  Constitutional: She appears well-developed. She appears cachectic. She appears ill.  HENT:  Head: Normocephalic and atraumatic.  Cardiovascular: Normal rate.   Pulmonary/Chest: No accessory muscle usage. No tachypnea.  Mild-mod distress with activity  Abdominal: Normal appearance.  Neurological: She is alert.  Mostly oriented. Seems to have short term memory impairment.   Nursing note and vitals reviewed.  Vital Signs: BP (!) 121/51 (BP Location: Right Arm)   Pulse 93   Temp 98 F (36.7 C) (Oral)   Resp (!) 23   Ht '5\' 2"'$  (1.575 m)   Wt 51.7 kg (114 lb)   SpO2 93%   BMI 20.85 kg/m  Pain Assessment: No/denies pain POSS *See Group Information*:  1-Acceptable,Awake and alert Pain Score: 0-No pain   SpO2: SpO2: 93 % O2 Device:SpO2: 93 % O2 Flow Rate: .O2 Flow Rate (L/min): 2 L/min  IO: Intake/output summary:  Intake/Output Summary (Last 24 hours) at 03/24/17 1927 Last data filed at 03/24/17 0506  Gross per 24 hour  Intake                0 ml  Output                0 ml  Net  0 ml    LBM: Last BM Date: 03/23/17 Baseline Weight: Weight: 63.5 kg (140 lb) Most recent weight: Weight: 51.7 kg (114 lb)     Palliative Assessment/Data:   Flowsheet Rows     Most Recent Value  Intake Tab  Referral Department  Hospitalist  Unit at Time of Referral  Med/Surg Unit  Palliative Care Primary Diagnosis  Cancer  Date Notified  03/23/17  Palliative Care Type  Return patient Palliative Care  Reason for referral  Clarify Goals of Care  Date of Admission  03/21/17  # of days IP prior to Palliative referral  2  Clinical Assessment  Psychosocial & Spiritual Assessment  Palliative Care Outcomes       Time Total: 6mn  Greater than 50%  of this time was spent counseling and coordinating care related to the above assessment and plan.  Signed by: AVinie Sill NP Palliative Medicine Team Pager # [phone](M-F 8a-5p) Team Phone # [phone](Nights/Weekends)

## 2017-03-24 NOTE — Evaluation (Signed)
Physical Therapy Evaluation Patient Details Name: Donna Lane MRN: [id number] DOB: 22-Jun-1925 Today's Date: 03/24/2017   History of Present Illness  Pt is a 83 y.o. female admitted for acute on chronic systolic CHF. Pt d/c from hospital about 1 week ago for sepsis secondary to pneumonia. Pt came to the ED from [facility] with LE weeping, weakness, SOB, hypothermia, and hyponatremia. Pt's PMH includes lung CA, a-fib, CHF, CKD stage III, COPD, CAD, DM, HLD, HTN, PVD, spinal stenosis, s/p CABG x4, L hip fracture, and lumbar disc surgery.   Clinical Impression  Pt was in bed asleep upon entry. Pt easily aroused and agreeable to PT evaluation. Pt reported severe dizziness lying bed (HR and O2 WNL and RN notified). Pt presented with grossly at least 3/5 strength with UE and LE (pt able to perform SLR x5 on both sides). Pt was modified independent with rolling in bed and min assist for sitting EOB. Pt reported that her dizziness increased with sitting and that she needed to lay back down (pt denies nausea). Pt will benefit from skilled PT during admission d/t decreased strength, decreased activity tolerance, decreased mobility, and dizziness. Recommend d/c to SNF d/t deficits listed above and limited at home support.       Follow Up Recommendations SNF    Equipment Recommendations  Rolling walker with 5" wheels (Pt reports that she has RW at home)    Recommendations for Other Services       Precautions / Restrictions Precautions Precautions: Fall Restrictions Weight Bearing Restrictions: No      Mobility  Bed Mobility Overal bed mobility: Needs Assistance Bed Mobility: Rolling;Sidelying to Sit;Sit to Sidelying Rolling: Modified independent (Device/Increase time) (Increased time and use of L bed rail) Sidelying to sit: Min assist;HOB elevated     Sit to sidelying: Min assist;HOB elevated General bed mobility comments: Pt moving well in bed, but c/o severe dizziness and felt  apprehensive about falling; v/c's required for rolling and pushing through UE to sit up  Transfers                 General transfer comment: Not assess d/t pt reporting severe dizziness lying in bed and increased dizziness sitting EOB  Ambulation/Gait                Stairs            Wheelchair Mobility    Modified Rankin (Stroke Patients Only)       Balance Overall balance assessment: Needs assistance Sitting-balance support: Bilateral upper extremity supported;Feet supported Sitting balance-Leahy Scale: Poor Sitting balance - Comments: Pt required min assist to maintain sitting balance; pt c/o severe dizziness that worsened with sitting.       Standing balance comment: not assessed d/t dizziness                             Pertinent Vitals/Pain Pain Assessment: 0-10 Pain Score: 10-Worst pain ever Pain Location: Throat  (RN notified) Pain Descriptors / Indicators: Burning;Constant (Pt reports "it feels like gravel" in her throat) Pain Intervention(s): Limited activity within patient's tolerance;Monitored during session;Patient requesting pain meds-RN notified (pt given water and reported mild decrease in pain.)  Pt's HR and O2 sats monitored and WFL during PT evaluation.     Home Living Family/patient expects to be discharged to:: Skilled nursing facility Living Arrangements: Other (Comment);Spouse/significant other (Pt was at [facility] prior to admission; pt reports that she lives  with her husband at home.) Available Help at Discharge: Family;Available PRN/intermittently (Pt reports that her husband is not able to help at home, and her family can help when they are not working) Type of Home: House Home Access: Stairs to enter Entrance Stairs-Rails:  (Pt could not recall if there is a rail or not.) Entrance Stairs-Number of Steps: 3 Home Layout: One [place]: [place] - 2 wheels;Walker - 4 wheels      Prior Function Level  of Independence: Needs assistance   Gait / Transfers Assistance Needed: Pt reports that the staff at [facility] was assisting with all mobility and she was usuing a RW; pt was independent at home  ADL's / Homemaking Assistance Needed: Pt reports that staff at [facility] was assisting with all ADLs and IADLs; Pt reprots being idependent at home  Comments: Pt reports that when she was at home she was independent, did not require an AD, and only used oxygen at night.     Hand Dominance   Dominant Hand: Right    Extremity/Trunk Assessment   Upper Extremity Assessment Upper Extremity Assessment: Generalized weakness (UE grossly 3/5 stregth with functional movements and moderate grip strength )    Lower Extremity Assessment Lower Extremity Assessment: Generalized weakness (Pt able to perform B SLR, unable to perform MMTs d/t LE swelling and weeping; pt reports moderate pain with LE movments. )    Cervical / Trunk Assessment Cervical / Trunk Assessment: Kyphotic  Communication   Communication: No difficulties  Cognition Arousal/Alertness: Awake/alert Behavior During Therapy: WFL for tasks assessed/performed Overall Cognitive Status: Within Functional Limits for tasks assessed                                        General Comments  Pt reports 1 fall w/o injury in the last 6 month.    Exercises General Exercises - Lower Extremity Straight Leg Raises: AROM;5 reps;Both;Supine   Assessment/Plan    PT Assessment Patient needs continued PT services  PT Problem List Decreased strength;Decreased activity tolerance;Decreased balance;Decreased mobility;Decreased skin integrity;Pain       PT Treatment Interventions DME instruction;Gait training;Stair training;Functional mobility training;Therapeutic activities;Therapeutic exercise;Balance training;Patient/family education    PT Goals (Current goals can be found in the Care Plan section)  Acute Rehab PT  Goals Patient Stated Goal: to get stronger and go back home PT Goal Formulation: With patient Time For Goal Achievement: 04/07/17 Potential to Achieve Goals: Fair    Frequency Min 2X/week   Barriers to discharge Decreased caregiver support      Co-evaluation               End of Session   Activity Tolerance: Other (comment) (Pt limited by dizziness.) Patient left: in bed;with call bell/phone within reach;with bed alarm set (with chuck pads under B UE; with B LE elevated with 3 pillows and a chuck pad) Nurse Communication: Mobility status;Patient requests pain meds (Reported pt c/o dizziness and pain in her throat) PT Visit Diagnosis: Dizziness and giddiness (R42);Muscle weakness (generalized) (M62.81);History of falling (Z91.81);Difficulty in walking, not elsewhere classified (R26.2)    Time: 0258-5277 PT Time Calculation (min) (ACUTE ONLY): 33 min   Charges:         PT G Codes:         Jaymar Loeber, SPT 03/24/2017, 10:52 AM

## 2017-03-24 NOTE — Consult Note (Signed)
[facility] Nurse wound consult note Reason for Consult:Assess Unnas boots and unstageable pressure injury, present on admission.  Wound type: Full thickness deep tissue injury Pressure Injury POA: Yes Measurement: Sacrum:  4 cm x 4 cm 100% fluctuant slough with darkened center.  On low air loss bed.  Bilateral Unnas boots.  0.5 cm pink moist wound to right anterior lower leg.  No edema noted in lower extremities.  Third spacing, upper extremity edema with scant serous weeping present.  Wound CZY:SAYT moist Drainage (amount, consistency, odor) weeping to arms.  Periwound:Intact Dressing procedure/placement/frequency:Cleanse sacral wound with NS and pat gently dry.  Apply Dakin's moist gauze, secure with ABD pad and tape.  Change daily.   Unnas boots were removed and no edema present.  Due to upper extremity edema, will discontinue wraps to legs for now and monitor.  Will not follow at this time.  Please re-consult if needed.  Domenic Moras RN BSN [place] Pager [phone]

## 2017-03-24 NOTE — Progress Notes (Signed)
Patient ID: Donna Lane, female   DOB: February 14, 1925, 83 y.o.   MRN: [id number]   [place] PROGRESS NOTE  Donna Lane VOH:[id number] DOB: 18-Jul-1925 DOA: 03/21/2017 PCP: [name], MD  HPI/Subjective: Patient states that she feels rough every day. Still with cough and shortness of breath. Her mouth hurts and she states she can't swallow well.  Objective: Vitals:   03/24/17 0859 03/24/17 1229  BP: (!) 117/42 (!) 121/51  Pulse: 81 93  Resp:  (!) 23  Temp:  98 F (36.7 C)    Filed Weights   03/22/17 0628 03/23/17 0453 03/24/17 0500  Weight: 57.2 kg (126 lb) 52.6 kg (116 lb) 51.7 kg (114 lb)    ROS: Review of Systems  Constitutional: Negative for chills and fever.  Eyes: Negative for blurred vision.  Respiratory: Positive for cough and shortness of breath.   Cardiovascular: Negative for chest pain.  Gastrointestinal: Positive for nausea. Negative for abdominal pain, constipation, diarrhea and vomiting.  Genitourinary: Negative for dysuria.  Musculoskeletal: Negative for joint pain.  Neurological: Positive for weakness. Negative for dizziness and headaches.   Exam: Physical Exam  Constitutional: She is oriented to person, place, and time.  HENT:  Nose: No mucosal edema.  Mouth/Throat: No oropharyngeal exudate or posterior oropharyngeal edema.  Eyes: Conjunctivae, EOM and lids are normal. Pupils are equal, round, and reactive to light.  Neck: No JVD present. Carotid bruit is not present. No edema present. No thyroid mass and no thyromegaly present.  Cardiovascular: S1 normal and S2 normal.  Exam reveals no gallop.   No murmur heard. Pulses:      Dorsalis pedis pulses are 2+ on the right side, and 2+ on the left side.  Respiratory: No respiratory distress. She has decreased breath sounds in the right middle field, the right lower field, the left middle field and the left lower field. She has no wheezes. She has rhonchi in the right middle field and the left  middle field. She has rales in the right lower field.  GI: Soft. Bowel sounds are normal. There is no tenderness.  Musculoskeletal:       Right wrist: She exhibits swelling.       Right ankle: She exhibits swelling.       Left ankle: She exhibits swelling.  Lymphadenopathy:    She has no cervical adenopathy.  Neurological: She is alert and oriented to person, place, and time. No cranial nerve deficit.  Skin: Skin is warm. No rash noted. Nails show no clubbing.  Unna boots bilateral lower extremities  Psychiatric: She has a normal mood and affect.      Data Reviewed: Basic Metabolic Panel:  Recent Labs Lab 03/22/17 0327 03/22/17 0742 03/22/17 1547 03/22/17 2251 03/23/17 0434 03/23/17 1506 03/24/17 0306  NA 121* 121* 119* 121* 122* 125* 126*  K 5.5* 5.7* 5.4*  --  5.7* 4.2 4.0  CL 86* 87*  --   --  85* 86* 86*  CO2 27 26  --   --  '27 29 30  '$ GLUCOSE 178* 179*  --   --  215* 261* 192*  BUN 46* 45*  --   --  45* 47* 45*  CREATININE 1.96* 1.79*  --   --  1.73* 1.77* 1.65*  CALCIUM 8.0* 8.1*  --   --  8.4* 8.1* 8.1*   CBC:  Recent Labs Lab 03/21/17 0123 03/22/17 0327 03/24/17 0306  WBC 38.4* 30.5* 17.4*  HGB 11.4* 10.5* 10.7*  HCT 35.0  31.4* 32.9*  MCV 84.1 83.7 85.0  PLT 225 212 202   Cardiac Enzymes:  Recent Labs Lab 03/21/17 0209 03/21/17 1014 03/21/17 1341  TROPONINI 0.04* 0.29* 0.06*   BNP (last 3 results)  Recent Labs  01/06/17 1044 03/07/17 2238 03/21/17 0209  BNP 362.0* 182.0* 226.0*      Studies: Dg Chest Port 1 View  Result Date: 03/23/2017 CLINICAL DATA:  83 y/o  F; cough.  History of lung cancer. EXAM: PORTABLE CHEST 1 VIEW COMPARISON:  03/21/2017 chest radiograph. FINDINGS: Stable cardiomegaly partially obscured by large right effusion. Aortic atherosclerosis with calcification. Post CABG with sternotomy wires and alignment. Large right and small left pleural effusions, probably increased on the right from prior chest radiograph. Right  mid and lower lung zone opacification. IMPRESSION: Large right and small left pleural effusions probably increased on the right in comparison with prior chest radiograph. Opacification of right mid and lower lung zones and left lower lobe may represent atelectasis or pneumonia. Right lung mass is obscured by the effusion. Electronically Signed   By: Kristine Garbe M.D.   On: 03/23/2017 05:53    Scheduled Meds: . amiodarone  200 mg Oral BID  . atorvastatin  10 mg Oral q1800  . azithromycin  250 mg Oral Daily  . budesonide (PULMICORT) nebulizer solution  0.5 mg Nebulization BID  . cefTRIAXone (ROCEPHIN)  IV  1 g Intravenous Q24H  . docusate sodium  100 mg Oral BID  . fluconazole (DIFLUCAN) IV  100 mg Intravenous Q24H  . furosemide  60 mg Intravenous BID  . insulin aspart  0-5 Units Subcutaneous QHS  . insulin aspart  0-9 Units Subcutaneous TID WC  . ipratropium-albuterol  3 mL Nebulization TID  . levothyroxine  50 mcg Oral QAC breakfast  . mouth rinse  15 mL Mouth Rinse BID  . methylPREDNISolone (SOLU-MEDROL) injection  40 mg Intravenous Daily  . sodium hypochlorite   Irrigation Daily  . traZODone  25 mg Oral QHS  . cyanocobalamin  1,000 mcg Oral Daily    Assessment/Plan:  1. Acute diastolic congestive heart failure With hyponatremia. Lasix 60 mg IV twice a day. Sodium did not improve much with the tolvaptan. 2. Enlarging right pleural effusion. Case discussed with interventional radiology today. Hold the Lovenox tonight and tomorrow morning for thoracentesis tomorrow. Send off cytology and culture. The Eliquis on hold since 03/23/2017. 3. Acute kidney injury on chronic kidney disease stage III. Monitor with diuresis 4. Hyperkalemia. Improved after Kayexalate given 5. Atrial fibrillation. Rate controlled with amiodarone. Hold anticoagulation for thoracentesis tomorrow. Holding Cardizem CD with relative hypotension 6. History of lung cancer obstructing mass blocking the right  bronchus. Overall prognosis poor. Initial thoracentesis negative for malignancy. Too sick right now for bronchoscopy. 7. History of hyponatremia. Sodium up slightly at 122 8. Type 2 diabetes mellitus on sliding scale 9. Hyperlipidemia on statin 10. Hypothermia, possible pneumonia. On Rocephin and Zithromax. Temperature came back up. 11. Poor air entry start nebulizer treatments with budesonide and Solu-Medrol 12. Thrush and difficulty swallowing. IV Diflucan. Speech therapy downgraded diet 13. Elevated troponin demand ischemia from CHF 14. palliative care consultation   Case discussed today with family at the bedside.please see ACP note    Code Status:     Code Status Orders        Start     Ordered   03/21/17 0732  Do not attempt resuscitation (DNR)  Continuous    Question Answer Comment  In the event of cardiac  or respiratory ARREST Do not call a "code blue"   In the event of cardiac or respiratory ARREST Do not perform Intubation, CPR, defibrillation or ACLS   In the event of cardiac or respiratory ARREST Use medication by any route, position, wound care, and other measures to relive pain and suffering. May use oxygen, suction and manual treatment of airway obstruction as needed for comfort.      03/21/17 0731    Code Status History    Date Active Date Inactive Code Status Order ID Comments User Context   03/13/2017  9:43 AM 03/14/2017 12:09 AM DNR [id number]  Melton Alar, PA-C Inpatient   03/08/2017  2:15 AM 03/13/2017  9:43 AM Partial Code [id number]  Harrie Foreman, MD ED   01/07/2017  7:26 PM 01/09/2017  5:42 PM Partial Code [id number]  Vaughan Basta, MD Inpatient   01/06/2017  3:21 PM 01/07/2017  7:26 PM Full Code [id number]  Epifanio Lesches, MD ED   11/28/2016  7:40 PM 11/29/2016  3:44 PM Full Code [id number]  Vaughan Basta, MD Inpatient    Advance Directive Documentation     Most Recent Value  Type of Advance Directive  Healthcare Power of Attorney   Pre-existing out of facility DNR order (yellow form or pink MOST form)  -  "MOST" Form in Place?  -     Family Communication: Daughter, Husband and son-in-law at the bedside  Disposition Plan: To be determined  Antibiotics:  Rocephin  Zithromax  Time spent: 38 minutes, including ACP time  [place], [place]

## 2017-03-24 NOTE — Progress Notes (Signed)
Inpatient Diabetes Program Recommendations  AACE/ADA: New Consensus Statement on Inpatient Glycemic Control (2015)  Target Ranges:  Prepandial:   less than 140 mg/dL      [place] postprandial:   less than 180 mg/dL (1-2 hours)      Critically ill patients:  140 - 180 mg/dL   Lab Results  Component Value Date   GLUCAP 209 (H) 03/24/2017   HGBA1C 7.5 (H) 01/07/2017    Review of Glycemic Control Results for Donna Lane, Donna Lane (MRN [id number]) as of 03/24/2017 09:03  Ref. Range 03/23/2017 07:52 03/23/2017 11:37 03/23/2017 17:38 03/23/2017 21:35 03/24/2017 07:57  Glucose-Capillary Latest Ref Range: 65 - 99 mg/dL 222 (H) 185 (H) 254 (H) 229 (H) 209 (H)   Diabetes history:DM2 Outpatient Diabetes medications: Levemir 13 units QHS Novolog 0-10 units tid with meals Current orders for Inpatient glycemic control:Novolog 0-9 units tid, Novolog 0-5 units qhs  Inpatient Diabetes Program Recommendations: Please consider adding Levemir 6 units q HS.  Thanks, Adah Perl, RN, BC-ADM Inpatient Diabetes Coordinator Pager [phone] (8a-5p)

## 2017-03-24 NOTE — Progress Notes (Signed)
Patient ID: Donna Lane, female   DOB: Feb 01, 1925, 83 y.o.   MRN: [id number]  ACP Note  Patient, husband, daughter and son-in-law at the bedside.  Patient with a lung mass which is obstructing. The first thoracentesis was negative for malignancy. The patient is too sick for bronchoscopy. We'll get another thoracentesis tomorrow morning. The patient has not improve much with regards to her breathing despite IV Lasix, IV steroids, nebulizer treatments, antibiotics. The patient has painful swallowing and cannot eat very much.  Overall prognosis is poor.  Palliative care consultation ordered.  The patient's son-in-law also pulled me aside outside the room and talked to me about what this lung mass is. I told him it's likely a lung cancer that is obstructing. She is not a good candidate for bronchoscopy with biopsy. If the thoracentesis cytology comes back positive she may not even be a candidate for any further treatment for lung cancer anyway. Palliative care consultation can help talk through some difficult decisions that will likely be up soon.    Time spent on ACP discussion 17 minutes  Dr. Loletha Grayer

## 2017-03-25 ENCOUNTER — Inpatient Hospital Stay: Payer: Medicare Other

## 2017-03-25 LAB — GLUCOSE, CAPILLARY
Glucose-Capillary: 236 mg/dL — ABNORMAL HIGH (ref 65–99)
Glucose-Capillary: 177 mg/dL — ABNORMAL HIGH (ref 65–99)
Glucose-Capillary: 193 mg/dL — ABNORMAL HIGH (ref 65–99)
Glucose-Capillary: 179 mg/dL — ABNORMAL HIGH (ref 65–99)

## 2017-03-25 LAB — GLUCOSE, PLEURAL OR PERITONEAL FLUID: GLUCOSE FL: 232 mg/dL

## 2017-03-25 LAB — CBC
HEMATOCRIT: 34.2 % — AB (ref 35.0–47.0)
HEMOGLOBIN: 11.2 g/dL — AB (ref 12.0–16.0)
MCH: 27.5 pg (ref 26.0–34.0)
MCHC: 32.8 g/dL (ref 32.0–36.0)
MCV: 83.7 fL (ref 80.0–100.0)
Platelets: 235 10*3/uL (ref 150–440)
RBC: 4.08 MIL/uL (ref 3.80–5.20)
RDW: 15.4 % — AB (ref 11.5–14.5)
WBC: 22.8 10*3/uL — ABNORMAL HIGH (ref 3.6–11.0)

## 2017-03-25 LAB — BASIC METABOLIC PANEL
Anion gap: 11 (ref 5–15)
BUN: 47 mg/dL — AB (ref 6–20)
CALCIUM: 8.3 mg/dL — AB (ref 8.9–10.3)
CHLORIDE: 87 mmol/L — AB (ref 101–111)
CO2: 31 mmol/L (ref 22–32)
CREATININE: 1.46 mg/dL — AB (ref 0.44–1.00)
GFR calc non Af Amer: 31 mL/min — ABNORMAL LOW (ref >60)
GFR, EST AFRICAN AMERICAN: 36 mL/min — AB (ref >60)
Glucose, Bld: 217 mg/dL — ABNORMAL HIGH (ref 65–99)
Potassium: 3.7 mmol/L (ref 3.5–5.1)
SODIUM: 129 mmol/L — AB (ref 135–145)

## 2017-03-25 LAB — PROTEIN, PLEURAL OR PERITONEAL FLUID: Total protein, fluid: <3 g/dL

## 2017-03-25 MED ORDER — INSULIN ASPART 100 UNIT/ML ~~LOC~~ SOLN
0.0000 [IU] | Freq: Three times a day (TID) | SUBCUTANEOUS | Status: DC
  Administered 2017-03-25: 3 [IU] via SUBCUTANEOUS
  Administered 2017-03-26 (×3): 5 [IU] via SUBCUTANEOUS
  Administered 2017-03-27 (×2): 3 [IU] via SUBCUTANEOUS
  Administered 2017-03-27: 5 [IU] via SUBCUTANEOUS
  Administered 2017-03-28 (×2): 3 [IU] via SUBCUTANEOUS
  Administered 2017-03-28: 2 [IU] via SUBCUTANEOUS
  Administered 2017-03-29 (×2): 3 [IU] via SUBCUTANEOUS
  Administered 2017-03-29: 12 [IU] via SUBCUTANEOUS
  Administered 2017-03-30: 11 [IU] via SUBCUTANEOUS
  Administered 2017-03-30: 15 [IU] via SUBCUTANEOUS
  Administered 2017-03-30 – 2017-03-31 (×2): 11 [IU] via SUBCUTANEOUS
  Administered 2017-03-31: 15 [IU] via SUBCUTANEOUS
  Administered 2017-04-01: 11 [IU] via SUBCUTANEOUS
  Administered 2017-04-01: 8 [IU] via SUBCUTANEOUS
  Administered 2017-04-01: 2 [IU] via SUBCUTANEOUS
  Administered 2017-04-02: 17:00:00 8 [IU] via SUBCUTANEOUS
  Administered 2017-04-02: 2 [IU] via SUBCUTANEOUS
  Administered 2017-04-03: 3 [IU] via SUBCUTANEOUS
  Administered 2017-04-03: 08:00:00 2 [IU] via SUBCUTANEOUS
  Administered 2017-04-03: 3 [IU] via SUBCUTANEOUS
  Administered 2017-04-04 (×2): 5 [IU] via SUBCUTANEOUS
  Administered 2017-04-05: 17:00:00 8 [IU] via SUBCUTANEOUS
  Administered 2017-04-05 – 2017-04-06 (×4): 5 [IU] via SUBCUTANEOUS
  Administered 2017-04-07: 3 [IU] via SUBCUTANEOUS
  Administered 2017-04-07 – 2017-04-08 (×3): 5 [IU] via SUBCUTANEOUS
  Filled 2017-03-25: qty 5
  Filled 2017-03-25: qty 15
  Filled 2017-03-25: qty 5
  Filled 2017-03-25: qty 3
  Filled 2017-03-25 (×2): qty 5
  Filled 2017-03-25: qty 8
  Filled 2017-03-25: qty 5
  Filled 2017-03-25: qty 2
  Filled 2017-03-25: qty 15
  Filled 2017-03-25: qty 3
  Filled 2017-03-25: qty 5
  Filled 2017-03-25: qty 3
  Filled 2017-03-25: qty 12
  Filled 2017-03-25: qty 3
  Filled 2017-03-25: qty 15
  Filled 2017-03-25 (×2): qty 2
  Filled 2017-03-25: qty 11
  Filled 2017-03-25: qty 3
  Filled 2017-03-25: qty 5
  Filled 2017-03-25: qty 11
  Filled 2017-03-25 (×2): qty 5
  Filled 2017-03-25: qty 3
  Filled 2017-03-25: qty 5
  Filled 2017-03-25: qty 8
  Filled 2017-03-25: qty 5
  Filled 2017-03-25: qty 3
  Filled 2017-03-25: qty 8
  Filled 2017-03-25: qty 5
  Filled 2017-03-25: qty 11
  Filled 2017-03-25: qty 5
  Filled 2017-03-25: qty 11
  Filled 2017-03-25 (×4): qty 3

## 2017-03-25 MED ORDER — ENOXAPARIN SODIUM 60 MG/0.6ML ~~LOC~~ SOLN
1.0000 mg/kg | SUBCUTANEOUS | Status: DC
  Administered 2017-03-25: 45 mg via SUBCUTANEOUS
  Filled 2017-03-25 (×2): qty 0.6

## 2017-03-25 MED ORDER — INSULIN ASPART 100 UNIT/ML ~~LOC~~ SOLN
0.0000 [IU] | Freq: Every day | SUBCUTANEOUS | Status: DC
  Administered 2017-03-26: 5 [IU] via SUBCUTANEOUS
  Administered 2017-03-27 – 2017-03-28 (×2): 2 [IU] via SUBCUTANEOUS
  Administered 2017-03-29: 5 [IU] via SUBCUTANEOUS
  Administered 2017-03-30 – 2017-03-31 (×2): 2 [IU] via SUBCUTANEOUS
  Administered 2017-04-04 – 2017-04-05 (×2): 3 [IU] via SUBCUTANEOUS
  Filled 2017-03-25: qty 2
  Filled 2017-03-25: qty 3
  Filled 2017-03-25: qty 2
  Filled 2017-03-25: qty 5
  Filled 2017-03-25: qty 2
  Filled 2017-03-25: qty 3
  Filled 2017-03-25: qty 2

## 2017-03-25 MED ORDER — PREMIER PROTEIN SHAKE
11.0000 [oz_av] | Freq: Two times a day (BID) | ORAL | Status: DC
  Administered 2017-03-25 – 2017-04-08 (×22): 11 [oz_av] via ORAL

## 2017-03-25 NOTE — Progress Notes (Signed)
Initial Nutrition Assessment  DOCUMENTATION CODES:   Severe malnutrition in context of chronic illness, Underweight  INTERVENTION:  Provide Premier Protein po BID between meals, each supplement provides 160 kcal and 30 grams of protein.  Provide Magic cup BID with lunch and dinner, each supplement provides 290 kcal and 9 grams of protein.  Encouraged adequate intake of soft and easy-to-swallow foods with adequate calories and protein. Discussed options patient will likely be able to tolerate.  NUTRITION DIAGNOSIS:   Malnutrition (Severe) related to chronic illness (COPD, CHF, obstructing lung mass, thrush) as evidenced by severe depletion of body fat, severe depletion of muscle mass.  GOAL:   Patient will meet greater than or equal to 90% of their needs  MONITOR:   PO intake, Supplement acceptance, Labs, Weight trends, I & O's  REASON FOR ASSESSMENT:   Low Braden    ASSESSMENT:   83 year old female with PMHx of COPD, PVD, DM type 2, HTN, HLD, CAD s/p CABG x 4 in 6962, chronic diastolic heart failure, CKD stage III, obstructing lung mass (initial thoracentesis negative for malignancy but too unwell for bronchoscopy) who presented with swelling and weeping of lower extremities found to have acute diastolic CHF with hyponatremia, enlarging right pleural effusion, AKI, thrush and difficulty swallowing.   -Patient seen by SLP on 4/7 and diet was downgraded to dysphagia 3 with thin liquids. Patient tolerated warm liquids best with SLP. -Patient s/p US thoracentesis this morning removing 1 L fluid.  Spoke with patient and her daughter at bedside. Patient reports she has had a poor appetite for a while now due to her difficulty swallowing. She reports she even has difficulty with soft/chopped foods and liquids. She attempts to eat three meals daily, but reports she is eating smaller meals than usual and not finishing her meals. Patient is able to tolerate soups (enjoys tomato soup) and  applesauce well. Daughter had brought in Premier Protein shakes from home as patient enjoys these. Denies N/V, abdominal pain, C/D.   Patient reports UBW is usually 123 lbs without fluid (reports gaining up to 140 lbs with fluid). Per chart patient had weight this morning of 98 lbs. RD obtained bed scale weight of 107 lbs during assessment. Patient has lost at least 16 lbs (13% body weight) over 2 months, which is significant for time frame.  Medications reviewed and include: ceftriaxone, Colace, Lasix 60 mg BID, Novolog sliding scale TID with meals and daily at bedtime, levothyroxine, methylprednisolone 40 mg daily, vitamin B12 1000 micrograms daily.  Labs reviewed: CBG 177-272 past 24 hrs, Sodium 129, Chloride 87, BUN 47, Creatinine 1.46, EGFR 31.   Nutrition-Focused physical exam completed. Findings are severe fat depletion, severe muscle depletion, and moderate edema. Edema in upper extremities more pronounced than in lower extremities.   Diet Order:  DIET DYS 3 Room service appropriate? Yes; Fluid consistency: Thin  Skin:  Wound (see comment) (DTI to coccyx)  Last BM:  03/23/2017  Height:   Ht Readings from Last 1 Encounters:  03/21/17 5' 2"  (1.575 m)    Weight:   Wt Readings from Last 1 Encounters:  03/25/17 98 lb (44.5 kg)    Ideal Body Weight:  50 kg  BMI:  Body mass index is 17.92 kg/m.  Estimated Nutritional Needs:   Kcal:  1335-1560 (30-35 kcal/kg)  Protein:  65-80 grams (1.5-1.8 grams/kg)  Fluid:  1.1 L/day (25 ml/kg)  EDUCATION NEEDS:   No education needs identified at this time  Willey Blade, MS, RD,  LDN Pager: [phone] After Hours Pager: [phone]

## 2017-03-25 NOTE — Progress Notes (Signed)
Palliative:  Full note to follow. Spoke briefly with Ms. Rydberg and her son, daughter-in-law at bedside. They believe since her previous cytology from thoracentesis did not show malignancy that she does not have cancer. I spent some time discussing with the son why this is not necessarily reassuring. We reviewed CXRs and CT chest radiologist impression from December. He is still skeptical that this is cancer. He does say that he does not believe she would want any treatment for this anyway. We did discuss potential of PleurX drain if fluid reacculumated again. Will need continued education. Will speak more with patient and her daughter/HCPOA [name] tomorrow.   Vinie Sill, NP Palliative Medicine Team Pager # [phone] (M-F 8a-5p) Team Phone # [phone] (Nights/Weekends)

## 2017-03-25 NOTE — Progress Notes (Signed)
Patient ID: Donna Lane, female   DOB: Nov 09, 1925, 83 y.o.   MRN: [id number]   [facility] PROGRESS NOTE  Donna Lane [id number]:[id number] DOB: 1925-02-04 DOA: 03/21/2017 PCP: [name], MD  HPI/Subjective: Difficulty swallowing because of oral thrush, had thoracocentesis on 1 L of fluid is removed from the right lung. She still feels same without any appetite.  Objective: Vitals:   03/25/17 0918 03/25/17 1150  BP: (!) 108/52 (!) 108/46  Pulse: 97 92  Resp:    Temp:  97.4 F (36.3 C)    Filed Weights   03/23/17 0453 03/24/17 0500 03/25/17 0519  Weight: 52.6 kg (116 lb) 51.7 kg (114 lb) 44.5 kg (98 lb)    ROS: Review of Systems  Constitutional: Negative for chills and fever.  Eyes: Negative for blurred vision.  Respiratory: Positive for cough and shortness of breath.   Cardiovascular: Negative for chest pain.  Gastrointestinal: Positive for nausea. Negative for abdominal pain, constipation, diarrhea and vomiting.  Genitourinary: Negative for dysuria.  Musculoskeletal: Negative for joint pain.  Neurological: Positive for weakness. Negative for dizziness and headaches.   Exam: Physical Exam  Constitutional: She is oriented to person, place, and time.  HENT:  Nose: No mucosal edema.  Mouth/Throat: No oropharyngeal exudate or posterior oropharyngeal edema.  Eyes: Conjunctivae, EOM and lids are normal. Pupils are equal, round, and reactive to light.  Neck: No JVD present. Carotid bruit is not present. No edema present. No thyroid mass and no thyromegaly present.  Cardiovascular: S1 normal and S2 normal.  Exam reveals no gallop.   No murmur heard. Pulses:      Dorsalis pedis pulses are 2+ on the right side, and 2+ on the left side.  Respiratory: No respiratory distress. She has decreased breath sounds in the right middle field, the right lower field, the left middle field and the left lower field. She has no wheezes. She has rhonchi in the right middle field and  the left middle field. She has rales in the right lower field.  GI: Soft. Bowel sounds are normal. There is no tenderness.  Musculoskeletal:       Right wrist: She exhibits swelling.       Right ankle: She exhibits swelling.       Left ankle: She exhibits swelling.  Lymphadenopathy:    She has no cervical adenopathy.  Neurological: She is alert and oriented to person, place, and time. No cranial nerve deficit.  Skin: Skin is warm. No rash noted. Nails show no clubbing.  Unna boots bilateral lower extremities  Psychiatric: She has a normal mood and affect.      Data Reviewed: Basic Metabolic Panel:  Recent Labs Lab 03/22/17 0742 03/22/17 1547 03/22/17 2251 03/23/17 0434 03/23/17 1506 03/24/17 0306 03/25/17 0539  NA 121* 119* 121* 122* 125* 126* 129*  K 5.7* 5.4*  --  5.7* 4.2 4.0 3.7  CL 87*  --   --  85* 86* 86* 87*  CO2 26  --   --  '27 29 30 31  '$ GLUCOSE 179*  --   --  215* 261* 192* 217*  BUN 45*  --   --  45* 47* 45* 47*  CREATININE 1.79*  --   --  1.73* 1.77* 1.65* 1.46*  CALCIUM 8.1*  --   --  8.4* 8.1* 8.1* 8.3*   CBC:  Recent Labs Lab 03/21/17 0123 03/22/17 0327 03/24/17 0306 03/25/17 0539  WBC 38.4* 30.5* 17.4* 22.8*  HGB 11.4* 10.5* 10.7*  11.2*  HCT 35.0 31.4* 32.9* 34.2*  MCV 84.1 83.7 85.0 83.7  PLT 225 212 202 235   Cardiac Enzymes:  Recent Labs Lab 03/21/17 0209 03/21/17 1014 03/21/17 1341  TROPONINI 0.04* 0.29* 0.06*   BNP (last 3 results)  Recent Labs  01/06/17 1044 03/07/17 2238 03/21/17 0209  BNP 362.0* 182.0* 226.0*      Studies: Dg Chest Port 1 View  Result Date: 03/25/2017 CLINICAL DATA:  Post thoracentesis EXAM: PORTABLE CHEST 1 VIEW COMPARISON:  03/23/2017 FINDINGS: Decreasing right effusion following thoracentesis. Moderate right effusion remains. No pneumothorax. Right base atelectasis. Heart is borderline in size. Prior CABG. No focal opacity on the left. IMPRESSION: Moderate right effusion, decreasing following right  thoracentesis. No pneumothorax. Electronically Signed   By: Rolm Baptise M.D.   On: 03/25/2017 09:45   US Thoracentesis Asp Pleural Space W/img Guide  Result Date: 03/25/2017 INDICATION: Right pleural effusion EXAM: ULTRASOUND GUIDED RIGHT THORACENTESIS MEDICATIONS: None. COMPLICATIONS: None immediate. PROCEDURE: An ultrasound guided thoracentesis was thoroughly discussed with the patient and questions answered. The benefits, risks, alternatives and complications were also discussed. The patient understands and wishes to proceed with the procedure. Written consent was obtained. Ultrasound was performed to localize and mark an adequate pocket of fluid in the right chest. The area was then prepped and draped in the normal sterile fashion. 1% Lidocaine was used for local anesthesia. Under ultrasound guidance a Safe-T-Centesis catheter was introduced. Thoracentesis was performed. The catheter was removed and a dressing applied. FINDINGS: A total of approximately 1 L of clear yellow fluid was removed. Samples were sent to the laboratory as requested by the clinical team. IMPRESSION: Successful ultrasound guided right thoracentesis yielding 1 L of pleural fluid. Electronically Signed   By: Marybelle Killings M.D.   On: 03/25/2017 13:07    Scheduled Meds: . amiodarone  200 mg Oral BID  . atorvastatin  10 mg Oral q1800  . budesonide (PULMICORT) nebulizer solution  0.5 mg Nebulization BID  . cefTRIAXone (ROCEPHIN)  IV  1 g Intravenous Q24H  . docusate sodium  100 mg Oral BID  . fluconazole (DIFLUCAN) IV  100 mg Intravenous Q24H  . furosemide  60 mg Intravenous BID  . insulin aspart  0-5 Units Subcutaneous QHS  . insulin aspart  0-9 Units Subcutaneous TID WC  . ipratropium-albuterol  3 mL Nebulization TID  . levothyroxine  50 mcg Oral QAC breakfast  . mouth rinse  15 mL Mouth Rinse BID  . methylPREDNISolone (SOLU-MEDROL) injection  40 mg Intravenous Daily  . protein supplement shake  11 oz Oral BID BM  .  sodium hypochlorite   Irrigation Daily  . traZODone  25 mg Oral QHS  . cyanocobalamin  1,000 mcg Oral Daily    Assessment/Plan:  1. Acute diastolic congestive heart failure With hyponatremia. Lasix 60 mg IV twice a day. S 2. Slowly improving, status post thoracocentesis and 1 L of fluid removed. Resume liquids. Continue oxygen. Sodium improved from 121-129. 3.  4. Acute kidney injury on chronic kidney disease stage III. Monitor with diuresis 5. Hyperkalemia. Improved after Kayexalate given 6. Atrial fibrillation. Rate controlled with amiodarone 7.  8.  9. History of lung cancer obstructing mass blocking the right bronchus. Overall prognosis poor. Initial thoracentesis negative for malignancy. Too sick right now for bronchoscopy. 10. History of hyponatremia. Sodium improved to 129.  11. Type 2 diabetes mellitus on sliding scale ,po take is really poor because of odynophagia.  12. Hyperlipidemia on statin 13. Hypothermia,  possible pneumonia. On Rocephin and Zithromax. Temperature came back up. Continue empiric antibiotics. 14. Poor air entry start nebulizer treatments with budesonide and Solu-Medrol 15. Thrush and difficulty swallowing. IV Diflucan. Speech therapy downgraded diet 16. Elevated troponin demand ischemia from CHF 17. palliative care consultation : CODE STATUS DO NOT RESUSCITATE.  Case discussed today with family at the bedside.please see ACP note    Code Status:     Code Status Orders        Start     Ordered   03/21/17 0732  Do not attempt resuscitation (DNR)  Continuous    Question Answer Comment  In the event of cardiac or respiratory ARREST Do not call a "code blue"   In the event of cardiac or respiratory ARREST Do not perform Intubation, CPR, defibrillation or ACLS   In the event of cardiac or respiratory ARREST Use medication by any route, position, wound care, and other measures to relive pain and suffering. May use oxygen, suction and manual treatment of  airway obstruction as needed for comfort.      03/21/17 0731    Code Status History    Date Active Date Inactive Code Status Order ID Comments User Context   03/13/2017  9:43 AM 03/14/2017 12:09 AM DNR [id number]  Melton Alar, PA-C Inpatient   03/08/2017  2:15 AM 03/13/2017  9:43 AM Partial Code [id number]  Harrie Foreman, MD ED   01/07/2017  7:26 PM 01/09/2017  5:42 PM Partial Code [id number]  Vaughan Basta, MD Inpatient   01/06/2017  3:21 PM 01/07/2017  7:26 PM Full Code [id number]  Epifanio Lesches, MD ED   11/28/2016  7:40 PM 11/29/2016  3:44 PM Full Code [id number]  Vaughan Basta, MD Inpatient    Advance Directive Documentation     Most Recent Value  Type of Advance Directive  Healthcare Power of Attorney  Pre-existing out of facility DNR order (yellow form or pink MOST form)  -  "MOST" Form in Place?  -     Family Communication: Daughter, Husband and son-in-law at the bedside  Disposition Plan: To be determined  Antibiotics:  Rocephin  Zithromax  Time spent: 38 minutes, including ACP time  [employer]

## 2017-03-25 NOTE — Procedures (Signed)
Right Thoracentesis 1 L EBL 0 COMP 0

## 2017-03-26 LAB — CULTURE, BLOOD (ROUTINE X 2)
Culture: NO GROWTH
Culture: NO GROWTH
Special Requests: ADEQUATE
Special Requests: ADEQUATE

## 2017-03-26 LAB — MISC LABCORP TEST (SEND OUT): Labcorp test code: 19588

## 2017-03-26 LAB — GLUCOSE, CAPILLARY
Glucose-Capillary: 218 mg/dL — ABNORMAL HIGH (ref 65–99)
Glucose-Capillary: 250 mg/dL — ABNORMAL HIGH (ref 65–99)
Glucose-Capillary: 227 mg/dL — ABNORMAL HIGH (ref 65–99)
Glucose-Capillary: 363 mg/dL — ABNORMAL HIGH (ref 65–99)

## 2017-03-26 LAB — PH, BODY FLUID: pH, Body Fluid: 7.8

## 2017-03-26 MED ORDER — ENOXAPARIN SODIUM 40 MG/0.4ML ~~LOC~~ SOLN
1.0000 mg/kg | SUBCUTANEOUS | Status: DC
  Administered 2017-03-26: 40 mg via SUBCUTANEOUS

## 2017-03-26 NOTE — Progress Notes (Signed)
Patient ID: Donna Lane, female   DOB: 1925/04/30, 83 y.o.   MRN: [id number]   [facility] PROGRESS NOTE  Donna Lane [id number]:[id number] DOB: 12/30/1924 DOA: 03/21/2017 PCP: [name], MD  HPI/Subjective: Difficulty swallowing because of oral thrush, had thoracocentesis on 1 L of fluid is removed from the right lung. She still feels same without any appetite.  Objective: Vitals:   03/26/17 0516 03/26/17 1300  BP: (!) 106/56 (!) 116/48  Pulse: (!) 102 (!) 105  Resp: 18 18  Temp: 97.6 F (36.4 C) 97.9 F (36.6 C)    Filed Weights   03/24/17 0500 03/25/17 0519 03/26/17 0500  Weight: 51.7 kg (114 lb) 44.5 kg (98 lb) 42 kg (92 lb 9.6 oz)    ROS: Review of Systems  Constitutional: Negative for chills and fever.  Eyes: Negative for blurred vision.  Respiratory: Positive for cough and shortness of breath.   Cardiovascular: Negative for chest pain.  Gastrointestinal: Positive for nausea. Negative for abdominal pain, constipation, diarrhea and vomiting.  Genitourinary: Negative for dysuria.  Musculoskeletal: Negative for joint pain.  Neurological: Positive for weakness. Negative for dizziness and headaches.   Exam: Physical Exam  Constitutional: She is oriented to person, place, and time.  HENT:  Nose: No mucosal edema.  Mouth/Throat: No oropharyngeal exudate or posterior oropharyngeal edema.  Eyes: Conjunctivae, EOM and lids are normal. Pupils are equal, round, and reactive to light.  Neck: No JVD present. Carotid bruit is not present. No edema present. No thyroid mass and no thyromegaly present.  Cardiovascular: S1 normal and S2 normal.  Exam reveals no gallop.   No murmur heard. Pulses:      Dorsalis pedis pulses are 2+ on the right side, and 2+ on the left side.  Respiratory: No respiratory distress. She has decreased breath sounds in the right middle field, the right lower field, the left middle field and the left lower field. She has no wheezes. She has  rhonchi in the right middle field and the left middle field. She has rales in the right lower field.  GI: Soft. Bowel sounds are normal. There is no tenderness.  Musculoskeletal:       Right wrist: She exhibits swelling.       Right ankle: She exhibits swelling.       Left ankle: She exhibits swelling.  Lymphadenopathy:    She has no cervical adenopathy.  Neurological: She is alert and oriented to person, place, and time. No cranial nerve deficit.  Skin: Skin is warm. No rash noted. Nails show no clubbing.  Unna boots bilateral lower extremities  Psychiatric: She has a normal mood and affect.      Data Reviewed: Basic Metabolic Panel:  Recent Labs Lab 03/22/17 0742 03/22/17 1547 03/22/17 2251 03/23/17 0434 03/23/17 1506 03/24/17 0306 03/25/17 0539  NA 121* 119* 121* 122* 125* 126* 129*  K 5.7* 5.4*  --  5.7* 4.2 4.0 3.7  CL 87*  --   --  85* 86* 86* 87*  CO2 26  --   --  '27 29 30 31  '$ GLUCOSE 179*  --   --  215* 261* 192* 217*  BUN 45*  --   --  45* 47* 45* 47*  CREATININE 1.79*  --   --  1.73* 1.77* 1.65* 1.46*  CALCIUM 8.1*  --   --  8.4* 8.1* 8.1* 8.3*   CBC:  Recent Labs Lab 03/21/17 0123 03/22/17 0327 03/24/17 0306 03/25/17 0539  WBC 38.4* 30.5*  17.4* 22.8*  HGB 11.4* 10.5* 10.7* 11.2*  HCT 35.0 31.4* 32.9* 34.2*  MCV 84.1 83.7 85.0 83.7  PLT 225 212 202 235   Cardiac Enzymes:  Recent Labs Lab 03/21/17 0209 03/21/17 1014 03/21/17 1341  TROPONINI 0.04* 0.29* 0.06*   BNP (last 3 results)  Recent Labs  01/06/17 1044 03/07/17 2238 03/21/17 0209  BNP 362.0* 182.0* 226.0*      Studies: Dg Chest Port 1 View  Result Date: 03/25/2017 CLINICAL DATA:  Post thoracentesis EXAM: PORTABLE CHEST 1 VIEW COMPARISON:  03/23/2017 FINDINGS: Decreasing right effusion following thoracentesis. Moderate right effusion remains. No pneumothorax. Right base atelectasis. Heart is borderline in size. Prior CABG. No focal opacity on the left. IMPRESSION: Moderate right  effusion, decreasing following right thoracentesis. No pneumothorax. Electronically Signed   By: Rolm Baptise M.D.   On: 03/25/2017 09:45   US Thoracentesis Asp Pleural Space W/img Guide  Result Date: 03/25/2017 INDICATION: Right pleural effusion EXAM: ULTRASOUND GUIDED RIGHT THORACENTESIS MEDICATIONS: None. COMPLICATIONS: None immediate. PROCEDURE: An ultrasound guided thoracentesis was thoroughly discussed with the patient and questions answered. The benefits, risks, alternatives and complications were also discussed. The patient understands and wishes to proceed with the procedure. Written consent was obtained. Ultrasound was performed to localize and mark an adequate pocket of fluid in the right chest. The area was then prepped and draped in the normal sterile fashion. 1% Lidocaine was used for local anesthesia. Under ultrasound guidance a Safe-T-Centesis catheter was introduced. Thoracentesis was performed. The catheter was removed and a dressing applied. FINDINGS: A total of approximately 1 L of clear yellow fluid was removed. Samples were sent to the laboratory as requested by the clinical team. IMPRESSION: Successful ultrasound guided right thoracentesis yielding 1 L of pleural fluid. Electronically Signed   By: Marybelle Killings M.D.   On: 03/25/2017 13:07    Scheduled Meds: . amiodarone  200 mg Oral BID  . atorvastatin  10 mg Oral q1800  . budesonide (PULMICORT) nebulizer solution  0.5 mg Nebulization BID  . cefTRIAXone (ROCEPHIN)  IV  1 g Intravenous Q24H  . docusate sodium  100 mg Oral BID  . enoxaparin (LOVENOX) injection  1 mg/kg Subcutaneous Q24H  . fluconazole (DIFLUCAN) IV  100 mg Intravenous Q24H  . furosemide  60 mg Intravenous BID  . insulin aspart  0-15 Units Subcutaneous TID WC  . insulin aspart  0-5 Units Subcutaneous QHS  . ipratropium-albuterol  3 mL Nebulization TID  . levothyroxine  50 mcg Oral QAC breakfast  . mouth rinse  15 mL Mouth Rinse BID  . methylPREDNISolone  (SOLU-MEDROL) injection  40 mg Intravenous Daily  . protein supplement shake  11 oz Oral BID BM  . sodium hypochlorite   Irrigation Daily  . traZODone  25 mg Oral QHS  . cyanocobalamin  1,000 mcg Oral Daily    Assessment/Plan:  1. Acute diastolic congestive heart failure With hyponatremia. Lasix 60 mg IV twice a day. Still no improvement, 2.  3. Slowly improving, status post thoracocentesis and 1 L of fluid removed. Resume liquids. Continue oxygen. Sodium improved from 121-129. 4.  5. Acute kidney injury on chronic kidney disease stage III. Monitor with diuresis 6. Hyperkalemia. Improved after Kayexalate given 7. Atrial fibrillation. Rate controlled with amiodarone 8.  9.  10. History of lung cancer obstructing mass blocking the right bronchus. Overall prognosis poor. Initial thoracentesis negative for malignancy. Too sick right now for bronchoscopy. 11. History of hyponatremia. Sodium improved to 129.  12. Type  2 diabetes mellitus on sliding scale ,po take is really poor because of odynophagia.  13. Hyperlipidemia on statin 14. Hypothermia, possible pneumonia. On Rocephin and Zithromax. Temperature came back up. Continue empiric antibiotics. 15. Poor air entry start nebulizer treatments with budesonide and Solu-Medrol 16. Thrush and difficulty swallowing. IV Diflucan. Speech therapy downgraded diet 17. Elevated troponin demand ischemia from CHF 18. palliative care consultation : CODE STATUS DO NOT RESUSCITATE. Poor prognosis  Case discussed today with family at the bedside.please see ACP note    Code Status:     Code Status Orders        Start     Ordered   03/21/17 0732  Do not attempt resuscitation (DNR)  Continuous    Question Answer Comment  In the event of cardiac or respiratory ARREST Do not call a "code blue"   In the event of cardiac or respiratory ARREST Do not perform Intubation, CPR, defibrillation or ACLS   In the event of cardiac or respiratory ARREST Use  medication by any route, position, wound care, and other measures to relive pain and suffering. May use oxygen, suction and manual treatment of airway obstruction as needed for comfort.      03/21/17 0731    Code Status History    Date Active Date Inactive Code Status Order ID Comments User Context   03/13/2017  9:43 AM 03/14/2017 12:09 AM DNR [id number]  Melton Alar, PA-C Inpatient   03/08/2017  2:15 AM 03/13/2017  9:43 AM Partial Code [id number]  Harrie Foreman, MD ED   01/07/2017  7:26 PM 01/09/2017  5:42 PM Partial Code [id number]  Vaughan Basta, MD Inpatient   01/06/2017  3:21 PM 01/07/2017  7:26 PM Full Code [id number]  Epifanio Lesches, MD ED   11/28/2016  7:40 PM 11/29/2016  3:44 PM Full Code [id number]  Vaughan Basta, MD Inpatient    Advance Directive Documentation     Most Recent Value  Type of Advance Directive  Healthcare Power of Attorney  Pre-existing out of facility DNR order (yellow form or pink MOST form)  -  "MOST" Form in Place?  -     Family Communication: Daughter, Husband and son-in-law at the bedside  Disposition Plan: To be determined  Antibiotics:  Rocephin  Zithromax  Time spent: 38 minutes, including ACP time  [facility]

## 2017-03-26 NOTE — Progress Notes (Signed)
  Speech Language Pathology Treatment: Dysphagia  Patient Details Name: Donna Lane MRN: [id number] DOB: 10-Sep-1925 Today's Date: 03/26/2017 Time: 1350-1410 SLP Time Calculation (min) (ACUTE ONLY): 20 min  Assessment / Plan / Recommendation Clinical Impression  Pt was seen for dysphagia tx at lunch time today. Pt reports that she cant eat much because her throat still hurts. Pt is choosing only smooth foods and drinks from the menu but is currently on a dys  Diet. Pt took 10 bites of pudding and a few sips of water with ST. No immediate s/s of aspiration but some coughing noted at the end of the tx. Whn asked, Pt stated that she cannot swallow solids. Pt agreed to try Dys 1 diet so that she will have more choices of smooth foods. Rec diet downgrade to dys 1 with thin liquids for now. Hopefully if throat pain subsides, she will be willing to try more solid consistencies. Will f/u 1- days.   HPI HPI: The patient was discharged from the hospital one week ago following admission for pneumonia returns to the emergency department from her nursing home due to weeping lower extremities. The patient states that she is weak and that her legs are heavy 2 left. She denies other complaints although she is clearly short of breath. The patient is well-known to our service and has been in denial about her diagnosis of lung cancer. Upon admission last time the patient also denies shortness of breath however chest x-ray in the emergency department shows vascular congestion consistent with congestive heart failure. The patient's respiratory rate is elevated as well. She denies chest pain. Past medical history significant for atrial fibrillation, coronary artery disease status post CABG and diabetes. Pt reporting to nsg and MD that she is "unable" to swallow.       SLP Plan          Recommendations  Diet recommendations: Dysphagia 1 (puree) Medication Administration: Crushed with puree Supervision: Staff to  assist with self feeding Compensations: Slow rate;Small sips/bites Postural Changes and/or Swallow Maneuvers: Seated upright 90 degrees;Upright 30-60 min after meal                SLP Visit Diagnosis: Dysphagia, oropharyngeal phase (R13.12)       GO                Lucila Maine 03/26/2017, 5:11 PM

## 2017-03-26 NOTE — Care Management Important Message (Signed)
Important Message  Patient Details  Name: Donna Lane MRN: [id number] Date of Birth: 09/13/1925   Medicare Important Message Given:  Yes    Beverly Sessions, RN 03/26/2017, 10:31 AM

## 2017-03-26 NOTE — Progress Notes (Signed)
Chart reviewed, visited Pt for Tx. See note. Diet downgraded to dysphagia 1 pureed secondary to pain when swallowing. Pt agreed as she stated she cannt eat anything solids.

## 2017-03-27 ENCOUNTER — Inpatient Hospital Stay: Payer: Medicare Other

## 2017-03-27 DIAGNOSIS — I129 Hypertensive chronic kidney disease with stage 1 through stage 4 chronic kidney disease, or unspecified chronic kidney disease: Secondary | ICD-10-CM

## 2017-03-27 DIAGNOSIS — J449 Chronic obstructive pulmonary disease, unspecified: Secondary | ICD-10-CM

## 2017-03-27 DIAGNOSIS — E871 Hypo-osmolality and hyponatremia: Secondary | ICD-10-CM

## 2017-03-27 DIAGNOSIS — Z87891 Personal history of nicotine dependence: Secondary | ICD-10-CM

## 2017-03-27 DIAGNOSIS — E119 Type 2 diabetes mellitus without complications: Secondary | ICD-10-CM

## 2017-03-27 DIAGNOSIS — R05 Cough: Secondary | ICD-10-CM

## 2017-03-27 DIAGNOSIS — G629 Polyneuropathy, unspecified: Secondary | ICD-10-CM

## 2017-03-27 DIAGNOSIS — Z7189 Other specified counseling: Secondary | ICD-10-CM

## 2017-03-27 DIAGNOSIS — R5383 Other fatigue: Secondary | ICD-10-CM

## 2017-03-27 DIAGNOSIS — I4891 Unspecified atrial fibrillation: Secondary | ICD-10-CM

## 2017-03-27 DIAGNOSIS — J90 Pleural effusion, not elsewhere classified: Secondary | ICD-10-CM

## 2017-03-27 DIAGNOSIS — Z79899 Other long term (current) drug therapy: Secondary | ICD-10-CM

## 2017-03-27 DIAGNOSIS — E785 Hyperlipidemia, unspecified: Secondary | ICD-10-CM

## 2017-03-27 DIAGNOSIS — R918 Other nonspecific abnormal finding of lung field: Secondary | ICD-10-CM

## 2017-03-27 DIAGNOSIS — N183 Chronic kidney disease, stage 3 (moderate): Secondary | ICD-10-CM

## 2017-03-27 DIAGNOSIS — I509 Heart failure, unspecified: Secondary | ICD-10-CM

## 2017-03-27 DIAGNOSIS — Z515 Encounter for palliative care: Secondary | ICD-10-CM

## 2017-03-27 LAB — CBC
HCT: 34.9 % — ABNORMAL LOW (ref 35.0–47.0)
Hemoglobin: 11.6 g/dL — ABNORMAL LOW (ref 12.0–16.0)
MCH: 28.2 pg (ref 26.0–34.0)
MCHC: 33.2 g/dL (ref 32.0–36.0)
MCV: 84.9 fL (ref 80.0–100.0)
Platelets: 185 10*3/uL (ref 150–440)
RBC: 4.1 MIL/uL (ref 3.80–5.20)
RDW: 15.7 % — ABNORMAL HIGH (ref 11.5–14.5)
WBC: 24 10*3/uL — AB (ref 3.6–11.0)

## 2017-03-27 LAB — GLUCOSE, CAPILLARY
Glucose-Capillary: 169 mg/dL — ABNORMAL HIGH (ref 65–99)
Glucose-Capillary: 213 mg/dL — ABNORMAL HIGH (ref 65–99)
Glucose-Capillary: 189 mg/dL — ABNORMAL HIGH (ref 65–99)
Glucose-Capillary: 217 mg/dL — ABNORMAL HIGH (ref 65–99)

## 2017-03-27 LAB — CREATININE, SERUM
CREATININE: 1.38 mg/dL — AB (ref 0.44–1.00)
GFR calc Af Amer: 38 mL/min — ABNORMAL LOW (ref >60)
GFR, EST NON AFRICAN AMERICAN: 33 mL/min — AB (ref >60)

## 2017-03-27 LAB — CYTOLOGY - NON PAP

## 2017-03-27 MED ORDER — ENOXAPARIN SODIUM 60 MG/0.6ML ~~LOC~~ SOLN
1.0000 mg/kg | SUBCUTANEOUS | Status: DC
  Administered 2017-03-27 – 2017-03-30 (×4): 50 mg via SUBCUTANEOUS
  Filled 2017-03-27 (×4): qty 0.6

## 2017-03-27 NOTE — Progress Notes (Signed)
Physical Therapy Treatment Patient Details Name: Donna Lane MRN: [id number] DOB: 1925/06/26 Today's Date: 03/27/2017    History of Present Illness Pt is a 83 y.o. female admitted for acute on chronic cystolic CHF. Pt d/c from hospital about 1 week ago for sepsis secondary to pneumonia. Pt came to the ED from [facility] withh LE weeping, weakness, SOB, hypothermia, and hyponatremia. Pt's PMH includes lung CA, a-fib, CHF, CKD stage III, COPD, CAD, DM, HLD, HTN, PVD, spinal stenosis, s/p CABG x4, L hip fracture, and lumbar disc surgery.     PT Comments    Pt speaking with nurse upon entry. Pt c/o of severe dizziness while lying in bed and refused sitting EOB, but agreeable to bed level exercises. Pt found to be incontinent of urine at beginning of session. Pt required min assist for rolling in bed to change linens and tolerated bed level exercises well. Left pt with her husband and visitors at end of session. PT will continue to work toward increasing functional mobility and strength during next session.    Follow Up Recommendations  SNF     Equipment Recommendations  Rolling walker with 5" wheels (pt reports she has a RW at home)    Recommendations for Other Services       Precautions / Restrictions Precautions Precautions: Fall Restrictions Weight Bearing Restrictions: No    Mobility  Bed Mobility Overal bed mobility: Needs Assistance Bed Mobility: Rolling Rolling: Min assist (min assist for hips and legs)         General bed mobility comments: Pt moving well in bed, but c/o severe dizziness lying in bed and apprehensive about dizziness becoming worse with sitting EOB and falling  Transfers                 General transfer comment: Not assess d/t pt reporting severe dizziness lying in bed   Ambulation/Gait             General Gait Details: Not assess d/t pt reporting severe dizziness lying in bed    Stairs            Wheelchair Mobility     Modified Rankin (Stroke Patients Only)       Balance                                            Cognition Arousal/Alertness: Awake/alert Behavior During Therapy: WFL for tasks assessed/performed Overall Cognitive Status: Within Functional Limits for tasks assessed                                        Exercises General Exercises - Upper Extremity Shoulder Flexion: AROM;Strengthening;Both;5 reps;Supine (limited ROM to <90 degrees d/t swelling and pt discomfort) General Exercises - Lower Extremity Ankle Circles/Pumps: AROM;Both;10 reps;Supine;Strengthening Short Arc Quad: AROM;Strengthening;Both;5 reps;Supine Straight Leg Raises: AROM;Both;5 reps;Supine;Strengthening    General Comments        Pertinent Vitals/Pain Pain Score: 7  Pain Location: chest Pain Descriptors / Indicators: Tightness;Constant (Pt reports that she has felt this since her admission to the hospital and that this is not new or worse than before. HR and O2 WNL and RN notified.) Pain Intervention(s): Limited activity within patient's tolerance;Monitored during session  HR remained between 78-91bpm and O2 remained above 91% on 2L  Home Living                      Prior Function            PT Goals (current goals can now be found in the care plan section) Acute Rehab PT Goals Patient Stated Goal: to get stronger and go back home PT Goal Formulation: With patient Time For Goal Achievement: 04/07/17 Potential to Achieve Goals: Fair Additional Goals Additional Goal #1: Pt will be able to perform bed mobility/transfers with mod I in order to improve functional independence Progress towards PT goals: Progressing toward goals    Frequency    Min 2X/week      PT Plan      Co-evaluation             End of Session Equipment Utilized During Treatment: Oxygen (2L) Activity Tolerance: Other (comment) (Pt limited by dizziness) Patient left: in  bed;with call bell/phone within reach;with bed alarm set Nurse Communication: Mobility status (pt c/o dizziness, chest tightness, pain in her sacrum, and pt found incontinent of urine and linens were changed) PT Visit Diagnosis: Dizziness and giddiness (R42);Muscle weakness (generalized) (M62.81);History of falling (Z91.81);Difficulty in walking, not elsewhere classified (R26.2)     Time: 2585-2778 PT Time Calculation (min) (ACUTE ONLY): 30 min  Charges:                       G Codes:         Kaamil Morefield, SPT 03/27/2017, 1:10 PM

## 2017-03-27 NOTE — Progress Notes (Addendum)
Inpatient Diabetes Program Recommendations  AACE/ADA: New Consensus Statement on Inpatient Glycemic Control (2015)  Target Ranges:  Prepandial:   less than 140 mg/dL      [facility] postprandial:   less than 180 mg/dL (1-2 hours)      Critically ill patients:  140 - 180 mg/dL   Lab Results  Component Value Date   GLUCAP 169 (H) 03/27/2017   HGBA1C 7.5 (H) 01/07/2017    Review of Glycemic Control Results for Donna Lane, Donna Lane (MRN [id number]) as of 03/27/2017 11:06  Ref. Range 03/26/2017 07:43 03/26/2017 11:29 03/26/2017 16:40 03/26/2017 21:48 03/27/2017 07:44  Glucose-Capillary Latest Ref Range: 65 - 99 mg/dL 218 (H) 250 (H) 227 (H) 363 (H) 169 (H)    Diabetes history:DM2 Outpatient Diabetes medications: Levemir 13units QHS Novolog 0-10 units tid with meals  Current orders for Inpatient glycemic control:Novolog 0-9 units tid, Novolog 0-5 units qhs  Inpatient Diabetes Program Recommendations: Agree with current medications for blood sugar management.   Gentry Fitz, RN, BA, MHA, CDE Diabetes Coordinator Inpatient Diabetes Program  [phone] (Team Pager) [phone] ([place]) 03/27/2017 11:06 AM  Addendum- note that Novolog correction was 0-15 units tid not 0-9 units tid as note indicates.   Gentry Fitz, RN, BA, MHA, CDE Diabetes Coordinator Inpatient Diabetes Program  [phone] (Team Pager) [phone] ([place]) 03/28/2017 1:45 PM

## 2017-03-27 NOTE — Progress Notes (Signed)
Daily Progress Note   Patient Name: Donna Lane       Date: 03/27/2017 DOB: [birth year]  Age: 83 y.o. MRN#: [id number] Attending Physician: Donna Lesches, MD Primary Lane Physician: Donna Stain, MD Admit Date: 03/21/2017  Reason for Consultation/Follow-up: Establishing goals of Lane  Subjective: Donna Lane is having a bath. Doing "okay." Doesn't feel as bad as yesterday.   Length of Stay: 6  Current Medications: Scheduled Meds:  . amiodarone  200 mg Oral BID  . atorvastatin  10 mg Oral q1800  . budesonide (PULMICORT) nebulizer solution  0.5 mg Nebulization BID  . cefTRIAXone (ROCEPHIN)  IV  1 g Intravenous Q24H  . docusate sodium  100 mg Oral BID  . enoxaparin (LOVENOX) injection  1 mg/kg Subcutaneous Q24H  . fluconazole (DIFLUCAN) IV  100 mg Intravenous Q24H  . furosemide  60 mg Intravenous BID  . insulin aspart  0-15 Units Subcutaneous TID WC  . insulin aspart  0-5 Units Subcutaneous QHS  . ipratropium-albuterol  3 mL Nebulization TID  . levothyroxine  50 mcg Oral QAC breakfast  . mouth rinse  15 mL Mouth Rinse BID  . methylPREDNISolone (SOLU-MEDROL) injection  40 mg Intravenous Daily  . protein supplement shake  11 oz Oral BID BM  . traZODone  25 mg Oral QHS  . cyanocobalamin  1,000 mcg Oral Daily    Continuous Infusions:   PRN Meds: acetaminophen **OR** acetaminophen, antiseptic oral rinse, diphenhydrAMINE, menthol-cetylpyridinium, nitroGLYCERIN, ondansetron **OR** ondansetron (ZOFRAN) IV, oxyCODONE, Zinc Oxide  Physical Exam  Constitutional: She appears well-developed. She appears cachectic. She appears ill.  HENT:  Head: Normocephalic and atraumatic.  Cardiovascular: Normal rate.   Pulmonary/Chest: No accessory muscle usage. No tachypnea.  Mild-mod  distress with activity  Abdominal: Normal appearance.  Neurological: She is alert.  Mostly oriented. Seems to have short term memory impairment - does not remember conversation from yesterday. Also repeats herself multiple times in same conversation within minutes.   Nursing note and vitals reviewed.           Vital Signs: BP (!) 118/56 (BP Location: Left Arm)   Pulse 89   Temp 97.7 F (36.5 C) (Oral)   Resp 20   Ht '5\' 2"'$  (1.575 m)   Wt 49 kg (108 lb 1.6 oz)   SpO2 98%  BMI 19.77 kg/m  SpO2: SpO2: 98 % O2 Device: O2 Device: Nasal Cannula O2 Flow Rate: O2 Flow Rate (L/min): 2 L/min  Intake/output summary:  Intake/Output Summary (Last 24 hours) at 03/27/17 1737 Last data filed at 03/27/17 1610  Gross per 24 hour  Intake              480 ml  Output                0 ml  Net              480 ml   LBM: Last BM Date: 03/23/17 Baseline Weight: Weight: 63.5 kg (140 lb) Most recent weight: Weight: 49 kg (108 lb 1.6 oz)       Palliative Assessment/Data:    Flowsheet Rows     Most Recent Value  Intake Tab  Referral Department  Hospitalist  Unit at Time of Referral  Med/Surg Unit  Palliative Lane Primary Diagnosis  Cancer  Date Notified  03/23/17  Palliative Lane Type  Return patient Palliative Lane  Reason for referral  Clarify Goals of Lane  Date of Admission  03/21/17  # of days IP prior to Palliative referral  2  Clinical Assessment  Psychosocial & Spiritual Assessment  Palliative Lane Outcomes      Patient Active Problem List   Diagnosis Date Noted  . Acute on chronic systolic CHF (congestive heart failure) ([place]) 03/21/2017  . Pressure injury of skin 03/21/2017  . DNR (do not resuscitate)   . Shortness of breath   . Pleural effusion, right   . Healthcare-associated pneumonia   . Palliative Lane encounter   . Primary insomnia   . Sepsis ([place]) 03/08/2017  . Edema 02/23/2017  . Chronic diastolic heart failure ([place]) 01/19/2017  . Atrial fibrillation with rapid  ventricular response ([place]) 01/06/2017  . Acute respiratory distress 01/06/2017  . COPD (chronic obstructive pulmonary disease) ([place]) 01/06/2017  . Hypokalemia 01/06/2017  . Elevated troponin 11/29/2016  . Acute renal insufficiency 11/29/2016  . Hyponatremia 11/29/2016  . Mass of middle lobe of right lung 11/29/2016  . Atelectasis of right lung 11/29/2016  . Left eye complaint 07/14/2016  . Caregiver stress 07/14/2016  . Olecranon bursitis of right elbow 06/19/2016  . Myalgia and myositis 05/20/2016  . Chronic kidney disease, stage 3 01/09/2016  . Advance Lane planning 01/05/2016  . Spinal stenosis of lumbar region 01/05/2016  . Diabetes mellitus type 2, uncontrolled, with complications ([place]) 96/03/5408  . Hyperlipidemia 03/14/2010  . Essential hypertension 03/14/2010  . CORONARY ATHEROSLERO AUTOL VEIN BYPASS GRAFT 03/14/2010  . Peripheral arterial disease ([place]) 03/14/2010  . Chest pain 03/14/2010    Palliative Lane Assessment & Plan   HPI: 83 yo female with CAD, diastolic CHF, PVD, CKD III, DMII, diverticulosis, HTN, HLD, melanoma, spinal stenosis and pleural effusions (previous cytology benign) with lung mass obstructing right bronchus.    Assessment: I spoke more with Ms. Donna Lane daughter/HCPOA Donna Lane. Had a frank conversation with Donna Lane that her mother's breathing has improved some with thoracentesis but not really doing much better. Continues with extremely poor appetite and Ms. Donna Lane complains of throat pain but also admits to no appetite to me. I explain to Donna Lane that I fear there are some big decisions to be made as I do not feel that her mother is going to improve. Donna Lane is tearful but says that she knows this and has been thinking of this already. Knows that her mother needs 24/7 Lane that her husband  cannot provide. Also discussed decisions such as readmission and aggressiveness of Lane.   Unfortunately Donna Lane shares that Ms. Donna Lane's sister was in hospice and  Ms. Donna Lane has always believed that hospice "killed her baby sister" and has had Donna Lane promise to never let her go to hospice. Donna Lane has also been getting mixed messages from her brother (when we reviewed fluid on CXR he says there was no tumor when I specifically told him it would not be seen on CXR but on CT scan). Also clarified that the scans would be to have more evidence for patient and family of what we believe is cancer. Donna Lane understands that her mother is likely approaching EOL. Emotional support provided.   Ihor Dow will follow up with Donna Lane tomorrow who plans to be in room most of the day. Could benefit from MOST form.   Recommendations/Plan:  Poor intake: Treat thrush. Continue Premier Protein per dietician recommendations.   Ms. Stallworth does not desire invasive and aggressive measures.   Goals of Lane and Additional Recommendations:  Limitations on Scope of Treatment: No Tracheostomy  Code Status:  DNR  Prognosis:   Weeks to months.   Discharge Planning:  To Be Determined. Likely no other options but SNF with palliative follow up.    Thank you for allowing the Palliative Medicine Team to assist in the Lane of this patient.   Total Time 52mn Prolonged Time Billed  no       Greater than 50%  of this time was spent counseling and coordinating Lane related to the above assessment and plan.  AVinie Sill NP Palliative Medicine Team Pager # [phone](M-F 8a-5p) Team Phone # [phone](Nights/Weekends)

## 2017-03-27 NOTE — Consult Note (Addendum)
Hematology/Oncology Consult note [facility] [phone] [phone]  Patient Care Team: Tonia Ghent, MD as PCP - General (Family Medicine) Minna Merritts, MD as Consulting Physician (Cardiology) Alisa Graff, FNP as Nurse Practitioner (Family Medicine) Flora Lipps, MD as Consulting Physician (Pulmonary Disease)   Name of the patient: Donna Lane  [id number]  [birth year]    Reason for consult: Lung mass. Possible lung cancer   Requesting physician: DR. Vianne Bulls  Date of visit: 03/27/2017    History of presenting illness- patient is a 83 year old female who had a CT chest without contrast in December 2017. At that time she was noted to have changes in the right middle lobe consistent with a central obstructing mass lesion associated with the right middle lobe collapse. She has had right pleural effusion in the past which has been And was negative for malignancy. She has seen Dr. Mike Gip in the past and was undecided about treatment and never had bronchoscopy for diagnosis. She does have multiple comorbidities including coronary artery disease status post CABG, history of peripheral vascular disease and carotid disease. She was admitted recently for pneumonia and discharged and was brought again due to generalized weakness as well as leg swelling. She also complained of shortness of breath.   Patient currently does not wish to discuss anything that could elude to possible lung cancer. She keeps repeating that cause of her symptoms is fluid accumulation in her lung but does not wish to discuss anything further  ECOG PS- 2-3  Pain scale- 0   Review of systems- Review of Systems  Constitutional: Positive for malaise/fatigue. Negative for chills, fever and weight loss.  HENT: Negative for congestion, ear discharge and nosebleeds.   Eyes: Negative for blurred vision.  Respiratory: Positive for shortness of breath. Negative for cough,  hemoptysis, sputum production and wheezing.   Cardiovascular: Negative for chest pain, palpitations, orthopnea and claudication.  Gastrointestinal: Positive for nausea. Negative for abdominal pain, blood in stool, constipation, diarrhea, heartburn, melena and vomiting.  Genitourinary: Negative for dysuria, flank pain, frequency, hematuria and urgency.  Musculoskeletal: Negative for back pain, joint pain and myalgias.  Skin: Negative for rash.  Neurological: Positive for weakness. Negative for dizziness, tingling, focal weakness, seizures and headaches.  Endo/Heme/Allergies: Does not bruise/bleed easily.  Psychiatric/Behavioral: Negative for depression and suicidal ideas. The patient does not have insomnia.     Allergies  Allergen Reactions  . Nsaids Other (See Comments)    Would avoid due to CKD.... Pt states she not allergic    Patient Active Problem List   Diagnosis Date Noted  . Acute on chronic systolic CHF (congestive heart failure) ([place]) 03/21/2017  . Pressure injury of skin 03/21/2017  . DNR (do not resuscitate)   . Shortness of breath   . Pleural effusion, right   . Healthcare-associated pneumonia   . Palliative care encounter   . Primary insomnia   . Sepsis ([place]) 03/08/2017  . Edema 02/23/2017  . Chronic diastolic heart failure ([place]) 01/19/2017  . Atrial fibrillation with rapid ventricular response ([place]) 01/06/2017  . Acute respiratory distress 01/06/2017  . COPD (chronic obstructive pulmonary disease) ([place]) 01/06/2017  . Hypokalemia 01/06/2017  . Elevated troponin 11/29/2016  . Acute renal insufficiency 11/29/2016  . Hyponatremia 11/29/2016  . Mass of middle lobe of right lung 11/29/2016  . Atelectasis of right lung 11/29/2016  . Left eye complaint 07/14/2016  . Caregiver stress 07/14/2016  . Olecranon bursitis of right elbow 06/19/2016  . Myalgia  and myositis 05/20/2016  . Chronic kidney disease, stage 3 01/09/2016  . Advance care planning 01/05/2016  . Spinal  stenosis of lumbar region 01/05/2016  . Diabetes mellitus type 2, uncontrolled, with complications ([place]) 88/[id number]/6945  . Hyperlipidemia 03/14/2010  . Essential hypertension 03/14/2010  . CORONARY ATHEROSLERO AUTOL VEIN BYPASS GRAFT 03/14/2010  . Peripheral arterial disease ([place]) 03/14/2010  . Chest pain 03/14/2010     Past Medical History:  Diagnosis Date  . Arrhythmia    atrial fibrillation  . Chronic diastolic heart failure ([place])   . CKD (chronic kidney disease), stage III   . COPD (chronic obstructive pulmonary disease) ([place])   . Coronary artery disease 2008   CABG x 4,   . Diabetes mellitus   . Diverticulosis    on colonoscopy 08/12/2006  . Hyperlipidemia   . Hypertension   . Melanoma ([place])    removed by derm   . PVD (peripheral vascular disease) ([place])   . Spinal stenosis      Past Surgical History:  Procedure Laterality Date  . CARDIAC CATHETERIZATION     PTCA x 3  . CARDIOVERSION N/A 03/07/2017   Procedure: Cardioversion;  Surgeon: Minna Merritts, MD;  Location: [facility] ORS;  Service: Cardiovascular;  Laterality: N/A;  . CATARACT EXTRACTION    . CORONARY ARTERY BYPASS GRAFT  2008   x 4  . HIP FRACTURE SURGERY Left    pins placed w/o hip replacement, hardware removed later  . LUMBAR DISC SURGERY     in her 27s  . tonsillectomy    . VAGINAL DELIVERY     x2    Social History   Social History  . Marital status: Married    Spouse name: N/A  . Number of children: N/A  . Years of education: N/A   Occupational History  . Not on file.   Social History Main Topics  . Smoking status: Former Smoker    Packs/day: 1.00    Years: 50.00    Types: Cigarettes    Quit date: 03/20/2007  . Smokeless tobacco: Never Used  . Alcohol use No  . Drug use: No  . Sexual activity: No   Other Topics Concern  . Not on file   Social History Narrative   [facility] [place] [place]    Married 1953   2 kids, local.    Retired.      Family History  Problem Relation Age of Onset  .  Heart disease Father   . Heart disease Sister   . Diabetes Sister      Current Facility-Administered Medications:  .  acetaminophen (TYLENOL) tablet 650 mg, 650 mg, Oral, Q6H PRN, 650 mg at 03/26/17 2211 **OR** acetaminophen (TYLENOL) suppository 650 mg, 650 mg, Rectal, Q6H PRN, Harrie Foreman, MD .  amiodarone (PACERONE) tablet 200 mg, 200 mg, Oral, BID, Harrie Foreman, MD, 200 mg at 03/27/17 0939 .  antiseptic oral rinse (BIOTENE) solution 15 mL, 15 mL, Mouth Rinse, PRN, Loletha Grayer, MD .  atorvastatin (LIPITOR) tablet 10 mg, 10 mg, Oral, q1800, Harrie Foreman, MD, 10 mg at 03/26/17 1722 .  budesonide (PULMICORT) nebulizer solution 0.5 mg, 0.5 mg, Nebulization, BID, Loletha Grayer, MD, 0.5 mg at 03/27/17 0732 .  cefTRIAXone (ROCEPHIN) 1 g in dextrose 5 % 50 mL IVPB, 1 g, Intravenous, Q24H, Loletha Grayer, MD, 1 g at 03/27/17 0939 .  diphenhydrAMINE (BENADRYL) capsule 25 mg, 25 mg, Oral, QHS PRN, Harrie Foreman, MD .  docusate sodium (  COLACE) capsule 100 mg, 100 mg, Oral, BID, Harrie Foreman, MD, 100 mg at 03/27/17 6283 .  enoxaparin (LOVENOX) injection 50 mg, 1 mg/kg, Subcutaneous, Q24H, Epifanio Lesches, MD .  fluconazole (DIFLUCAN) IVPB 100 mg, 100 mg, Intravenous, Q24H, Loletha Grayer, MD, 100 mg at 03/26/17 1503 .  furosemide (LASIX) injection 60 mg, 60 mg, Intravenous, BID, Loletha Grayer, MD, 60 mg at 03/27/17 0819 .  insulin aspart (novoLOG) injection 0-15 Units, 0-15 Units, Subcutaneous, TID WC, Epifanio Lesches, MD, 5 Units at 03/27/17 1145 .  insulin aspart (novoLOG) injection 0-5 Units, 0-5 Units, Subcutaneous, QHS, Epifanio Lesches, MD, 5 Units at 03/26/17 2211 .  ipratropium-albuterol (DUONEB) 0.5-2.5 (3) MG/3ML nebulizer solution 3 mL, 3 mL, Nebulization, TID, Loletha Grayer, MD, 3 mL at 03/27/17 1345 .  levothyroxine (SYNTHROID, LEVOTHROID) tablet 50 mcg, 50 mcg, Oral, QAC breakfast, Harrie Foreman, MD, 50 mcg at 03/27/17 0600 .  MEDLINE  mouth rinse, 15 mL, Mouth Rinse, BID, Loletha Grayer, MD, 15 mL at 03/26/17 2210 .  menthol-cetylpyridinium (CEPACOL) lozenge 3 mg, 1 lozenge, Oral, PRN, Harrie Foreman, MD .  methylPREDNISolone sodium succinate (SOLU-MEDROL) 40 mg/mL injection 40 mg, 40 mg, Intravenous, Daily, Loletha Grayer, MD, 40 mg at 03/27/17 0939 .  nitroGLYCERIN (NITROSTAT) SL tablet 0.4 mg, 0.4 mg, Sublingual, Q5 min PRN, Harrie Foreman, MD .  ondansetron [facility]) tablet 4 mg, 4 mg, Oral, Q6H PRN **OR** ondansetron (ZOFRAN) injection 4 mg, 4 mg, Intravenous, Q6H PRN, Harrie Foreman, MD .  oxyCODONE (Oxy IR/ROXICODONE) immediate release tablet 2.5 mg, 2.5 mg, Oral, Q4H PRN, Harrie Foreman, MD, 2.5 mg at 03/22/17 2229 .  protein supplement (PREMIER PROTEIN) liquid, 11 oz, Oral, BID BM, Epifanio Lesches, MD, 11 oz at 03/27/17 0939 .  traZODone (DESYREL) tablet 25 mg, 25 mg, Oral, QHS, Harrie Foreman, MD, 25 mg at 03/26/17 2210 .  vitamin B-12 (CYANOCOBALAMIN) tablet 1,000 mcg, 1,000 mcg, Oral, Daily, Harrie Foreman, MD, 1,000 mcg at 03/27/17 [phone] .  Zinc Oxide (TRIPLE PASTE) 12.8 % ointment, , Topical, PRN, Pershing Proud, NP   Physical exam:  Vitals:   03/26/17 2005 03/27/17 0200 03/27/17 0411 03/27/17 1218  BP:  (!) 110/49 (!) 129/58 (!) 118/56  Pulse:  99 90 89  Resp:  '18 20 20  '$ Temp:  97.4 F (36.3 C) 97.4 F (36.3 C) 97.7 F (36.5 C)  TempSrc:  Oral Oral Oral  SpO2: 99% 99% 97% 98%  Weight:   108 lb 1.6 oz (49 kg)   Height:       Physical Exam  Constitutional: She is oriented to person, place, and time.  Elderly, frail appearing  HENT:  Head: Normocephalic and atraumatic.  Eyes: EOM are normal. Pupils are equal, round, and reactive to light.  Neck: Normal range of motion.  Cardiovascular: Normal rate, regular rhythm and normal heart sounds.   Pulmonary/Chest: Effort normal.  Breath sounds decreased over right side  Abdominal: Soft. Bowel sounds are normal.  Musculoskeletal:    Changes of chronic hyperpigmentation. No edema  Neurological: She is alert and oriented to person, place, and time.  Skin: Skin is warm and dry.       CMP Latest Ref Rng & Units 03/27/2017  Glucose 65 - 99 mg/dL -  BUN 6 - 20 mg/dL -  Creatinine 0.44 - 1.00 mg/dL 1.38(H)  Sodium 135 - 145 mmol/L -  Potassium 3.5 - 5.1 mmol/L -  Chloride 101 - 111 mmol/L -  CO2 22 -  32 mmol/L -  Calcium 8.9 - 10.3 mg/dL -  Total Protein 6.5 - 8.1 g/dL -  Total Bilirubin 0.3 - 1.2 mg/dL -  Alkaline Phos 38 - 126 U/L -  AST 15 - 41 U/L -  ALT 14 - 54 U/L -   CBC Latest Ref Rng & Units 03/27/2017  WBC 3.6 - 11.0 K/uL 24.0(H)  Hemoglobin 12.0 - 16.0 g/dL 11.6(L)  Hematocrit 35.0 - 47.0 % 34.9(L)  Platelets 150 - 440 K/uL 185    '@IMAGES'$ @  Dg Chest 1 View  Result Date: 03/27/2017 CLINICAL DATA:  83 year old female with a history of pleural effusion EXAM: CHEST 1 VIEW COMPARISON:  03/25/2017, 03/23/2017 FINDINGS: Cardiomediastinal silhouette likely unchanged with the right heart border partially obscured by lung/pleural disease. Calcifications of the aortic arch. Surgical changes median sternotomy and CABG. Left lung relatively well aerated. Minimal coarsened interstitial markings. Dense opacity at the right base, greater than the comparison plain film. Fluid within the minor fissure. No evidence of pneumothorax. IMPRESSION: Plain film demonstrates increasing right-sided pleural effusion and associated atelectasis/ consolidation. Aortic atherosclerosis. Surgical changes median sternotomy and CABG. Electronically Signed   By: Corrie Mckusick D.O.   On: 03/27/2017 14:21   Dg Chest 1 View  Result Date: 03/11/2017 CLINICAL DATA:  Status post right thoracentesis. EXAM: CHEST 1 VIEW COMPARISON:  Radiographs of March 07, 2017. FINDINGS: Stable cardiomediastinal silhouette. Atherosclerosis of thoracic aorta is noted. Status post coronary artery bypass graft. No pneumothorax is noted. Mild left pleural effusion is  noted. Right pleural effusion is noted which is improved compared to prior exam. Bony thorax is unremarkable. IMPRESSION: No pneumothorax status post right-sided thoracentesis. Electronically Signed   By: Marijo Conception, M.D.   On: 03/11/2017 11:40   Dg Chest 2 View  Result Date: 03/07/2017 CLINICAL DATA:  Shortness of breath with leg swelling EXAM: CHEST  2 VIEW COMPARISON:  01/06/2017 FINDINGS: Post sternotomy changes. Small left-sided pleural effusion. Suspect moderate right-sided pleural effusion. There is consolidation in the right middle lobe and right lower lobe. Stable cardiomegaly. No pneumothorax. IMPRESSION: 1. Small left pleural effusion. Suspect a moderate right pleural effusion. 2. Dense right middle lobe and lung base consolidation 3. Cardiomegaly Electronically Signed   By: Donavan Foil M.D.   On: 03/07/2017 23:13   US Venous Img Upper Uni Left  Result Date: 03/10/2017 CLINICAL DATA:  Left hand swelling EXAM: LEFT UPPER EXTREMITY VENOUS DOPPLER ULTRASOUND TECHNIQUE: Gray-scale sonography with graded compression, as well as color Doppler and duplex ultrasound were performed to evaluate the upper extremity deep venous system from the level of the subclavian vein and including the jugular, axillary, basilic, radial, ulnar and upper cephalic vein. Spectral Doppler was utilized to evaluate flow at rest and with distal augmentation maneuvers. COMPARISON:  None. FINDINGS: Contralateral Subclavian Vein: Respiratory phasicity is normal and symmetric with the symptomatic side. No evidence of thrombus. Normal compressibility. Internal Jugular Vein: No evidence of thrombus. Normal compressibility, respiratory phasicity and response to augmentation. Subclavian Vein: No evidence of thrombus. Normal compressibility, respiratory phasicity and response to augmentation. Axillary Vein: No evidence of thrombus. Normal compressibility, respiratory phasicity and response to augmentation. Cephalic Vein: No  evidence of thrombus. Normal compressibility, respiratory phasicity and response to augmentation. Basilic Vein: No evidence of thrombus. Normal compressibility, respiratory phasicity and response to augmentation. Brachial Veins: No evidence of thrombus. Normal compressibility, respiratory phasicity and response to augmentation. Radial Veins: No evidence of thrombus. Normal compressibility, respiratory phasicity and response to augmentation. Ulnar Veins:  No evidence of thrombus. Normal compressibility, respiratory phasicity and response to augmentation. Venous Reflux:  None visualized. Other Findings:  None visualized. IMPRESSION: No evidence of deep venous thrombosis. Electronically Signed   By: Julian Hy M.D.   On: 03/10/2017 16:33   Dg Chest Port 1 View  Result Date: 03/25/2017 CLINICAL DATA:  Post thoracentesis EXAM: PORTABLE CHEST 1 VIEW COMPARISON:  03/23/2017 FINDINGS: Decreasing right effusion following thoracentesis. Moderate right effusion remains. No pneumothorax. Right base atelectasis. Heart is borderline in size. Prior CABG. No focal opacity on the left. IMPRESSION: Moderate right effusion, decreasing following right thoracentesis. No pneumothorax. Electronically Signed   By: Rolm Baptise M.D.   On: 03/25/2017 09:45   Dg Chest Port 1 View  Result Date: 03/23/2017 CLINICAL DATA:  83 y/o  F; cough.  History of lung cancer. EXAM: PORTABLE CHEST 1 VIEW COMPARISON:  03/21/2017 chest radiograph. FINDINGS: Stable cardiomegaly partially obscured by large right effusion. Aortic atherosclerosis with calcification. Post CABG with sternotomy wires and alignment. Large right and small left pleural effusions, probably increased on the right from prior chest radiograph. Right mid and lower lung zone opacification. IMPRESSION: Large right and small left pleural effusions probably increased on the right in comparison with prior chest radiograph. Opacification of right mid and lower lung zones and left  lower lobe may represent atelectasis or pneumonia. Right lung mass is obscured by the effusion. Electronically Signed   By: Kristine Garbe M.D.   On: 03/23/2017 05:53   Dg Chest Port 1 View  Result Date: 03/21/2017 CLINICAL DATA:  Weakness and lightheadedness. Lower extremity edema. EXAM: PORTABLE CHEST 1 VIEW COMPARISON:  03/11/2017 FINDINGS: Pleural effusions bilaterally, increased from 03/11/2017. Diffuse interstitial fluid or thickening. Moderate vascular prominence. Central and basilar airspace opacities, right greater than left. IMPRESSION: The findings probably represent congestive heart failure with interstitial and alveolar edema as well as bilateral pleural effusions. Infectious infiltrates are not entirely excluded. Electronically Signed   By: Andreas Newport M.D.   On: 03/21/2017 01:52   US Thoracentesis Asp Pleural Space W/img Guide  Result Date: 03/25/2017 INDICATION: Right pleural effusion EXAM: ULTRASOUND GUIDED RIGHT THORACENTESIS MEDICATIONS: None. COMPLICATIONS: None immediate. PROCEDURE: An ultrasound guided thoracentesis was thoroughly discussed with the patient and questions answered. The benefits, risks, alternatives and complications were also discussed. The patient understands and wishes to proceed with the procedure. Written consent was obtained. Ultrasound was performed to localize and mark an adequate pocket of fluid in the right chest. The area was then prepped and draped in the normal sterile fashion. 1% Lidocaine was used for local anesthesia. Under ultrasound guidance a Safe-T-Centesis catheter was introduced. Thoracentesis was performed. The catheter was removed and a dressing applied. FINDINGS: A total of approximately 1 L of clear yellow fluid was removed. Samples were sent to the laboratory as requested by the clinical team. IMPRESSION: Successful ultrasound guided right thoracentesis yielding 1 L of pleural fluid. Electronically Signed   By: Marybelle Killings M.D.    On: 03/25/2017 13:07   US Thoracentesis Asp Pleural Space W/img Guide  Result Date: 03/11/2017 INDICATION: Right pleural effusion. EXAM: ULTRASOUND GUIDED right THORACENTESIS MEDICATIONS: None. COMPLICATIONS: None immediate. PROCEDURE: An ultrasound guided thoracentesis was thoroughly discussed with the patient and questions answered. The benefits, risks, alternatives and complications were also discussed. The patient understands and wishes to proceed with the procedure. Written consent was obtained. Ultrasound was performed to localize and mark an adequate pocket of fluid in the right chest. The area was then  prepped and draped in the normal sterile fashion. 1% Lidocaine was used for local anesthesia. Under ultrasound guidance a 8 Fr Safe-T-Centesis catheter was introduced. Thoracentesis was performed. The catheter was removed and a dressing applied. FINDINGS: A total of approximately 1500 mL of serous fluid was removed. Samples were sent to the laboratory as requested by the clinical team. IMPRESSION: Successful ultrasound guided right thoracentesis yielding 1.5 L of pleural fluid. Electronically Signed   By: Marijo Conception, M.D.   On: 03/11/2017 11:41    Assessment and plan- Patient is a 83 y.o. female found to have possible obstructing right lung mass  I spoke to patients son over the phone. They would like to know what is going on and reason for fluid accumulation. Cytology X 2 has been negative in the past but that does not exclude lung cancer. I recommended getting inpatient CT chest and abdomen wo contrast (given poor renal functions) versus outpatient PET/CT. They would like inpatient CT. CT will give [place] more information about lung mass as last CT was in December 2017. They would not like to proceed with tissue diagnosis and do not think that patient would want any treatment in the future as well. Please obtain CT chest/abdo pelvis to facilitate further discussions about possible diagnosis and  prognosis which would still be limited in the absence of tissue diagnosis  Thank you for this kind referral and the opportunity to participate in the care of this patient   Visit Diagnosis 1. Generalized weakness   2. Peripheral edema   3. Hyponatremia   4. Elevated troponin   5. Renal insufficiency   6. Cough   7. Pleural effusion on right   8. S/P thoracentesis   9. Pleural effusion     Dr. Randa Evens, MD, MPH [facility] Pager- [phone] 03/27/2017

## 2017-03-27 NOTE — Progress Notes (Signed)
Patient ID: Donna Lane, female   DOB: 19-Jul-1925, 83 y.o.   MRN: [id number]   [place] PROGRESS NOTE  Donna Lane KDT:[id number] DOB: 1925/07/28 DOA: 03/21/2017 PCP: [name], MD  HPI/Subjective:  Cough, mild shortness of breath. No improvement in swallowing. I want to repeat chest x-ray today, get GI consult for difficulty swallowing.  Objective: Vitals:   03/27/17 0411 03/27/17 1218  BP: (!) 129/58 (!) 118/56  Pulse: 90 89  Resp: 20 20  Temp: 97.4 F (36.3 C) 97.7 F (36.5 C)    Filed Weights   03/25/17 0519 03/26/17 0500 03/27/17 0411  Weight: 44.5 kg (98 lb) 42 kg (92 lb 9.6 oz) 49 kg (108 lb 1.6 oz)    ROS: Review of Systems  Constitutional: Negative for chills and fever.  Eyes: Negative for blurred vision.  Respiratory: Positive for cough and shortness of breath.   Cardiovascular: Negative for chest pain.  Gastrointestinal: Positive for nausea. Negative for abdominal pain, constipation, diarrhea and vomiting.  Genitourinary: Negative for dysuria.  Musculoskeletal: Negative for joint pain.  Neurological: Positive for weakness. Negative for dizziness and headaches.   Exam: Physical Exam  Constitutional: She is oriented to person, place, and time.  HENT:  Nose: No mucosal edema.  Mouth/Throat: No oropharyngeal exudate or posterior oropharyngeal edema.  Eyes: Conjunctivae, EOM and lids are normal. Pupils are equal, round, and reactive to light.  Neck: No JVD present. Carotid bruit is not present. No edema present. No thyroid mass and no thyromegaly present.  Cardiovascular: S1 normal and S2 normal.  Exam reveals no gallop.   No murmur heard. Pulses:      Dorsalis pedis pulses are 2+ on the right side, and 2+ on the left side.  Respiratory: No respiratory distress. She has decreased breath sounds in the right middle field, the right lower field, the left middle field and the left lower field. She has no wheezes. She has rhonchi in the right  middle field and the left middle field. She has rales in the right lower field.  GI: Soft. Bowel sounds are normal. There is no tenderness.  Musculoskeletal:       Right wrist: She exhibits swelling.       Right ankle: She exhibits swelling.       Left ankle: She exhibits swelling.  Lymphadenopathy:    She has no cervical adenopathy.  Neurological: She is alert and oriented to person, place, and time. No cranial nerve deficit.  Skin: Skin is warm. No rash noted. Nails show no clubbing.  Psychiatric: She has a normal mood and affect.      Data Reviewed: Basic Metabolic Panel:  Recent Labs Lab 03/22/17 0742 03/22/17 1547 03/22/17 2251 03/23/17 0434 03/23/17 1506 03/24/17 0306 03/25/17 0539 03/27/17 0605  NA 121* 119* 121* 122* 125* 126* 129*  --   K 5.7* 5.4*  --  5.7* 4.2 4.0 3.7  --   CL 87*  --   --  85* 86* 86* 87*  --   CO2 26  --   --  '27 29 30 31  '$ --   GLUCOSE 179*  --   --  215* 261* 192* 217*  --   BUN 45*  --   --  45* 47* 45* 47*  --   CREATININE 1.79*  --   --  1.73* 1.77* 1.65* 1.46* 1.38*  CALCIUM 8.1*  --   --  8.4* 8.1* 8.1* 8.3*  --    CBC:  Recent Labs Lab 03/21/17 0123 03/22/17 0327 03/24/17 0306 03/25/17 0539 03/27/17 0605  WBC 38.4* 30.5* 17.4* 22.8* 24.0*  HGB 11.4* 10.5* 10.7* 11.2* 11.6*  HCT 35.0 31.4* 32.9* 34.2* 34.9*  MCV 84.1 83.7 85.0 83.7 84.9  PLT 225 212 202 235 185   Cardiac Enzymes:  Recent Labs Lab 03/21/17 0209 03/21/17 1014 03/21/17 1341  TROPONINI 0.04* 0.29* 0.06*   BNP (last 3 results)  Recent Labs  01/06/17 1044 03/07/17 2238 03/21/17 0209  BNP 362.0* 182.0* 226.0*      Studies: No results found.  Scheduled Meds: . amiodarone  200 mg Oral BID  . atorvastatin  10 mg Oral q1800  . budesonide (PULMICORT) nebulizer solution  0.5 mg Nebulization BID  . cefTRIAXone (ROCEPHIN)  IV  1 g Intravenous Q24H  . docusate sodium  100 mg Oral BID  . enoxaparin (LOVENOX) injection  1 mg/kg Subcutaneous Q24H  .  fluconazole (DIFLUCAN) IV  100 mg Intravenous Q24H  . furosemide  60 mg Intravenous BID  . insulin aspart  0-15 Units Subcutaneous TID WC  . insulin aspart  0-5 Units Subcutaneous QHS  . ipratropium-albuterol  3 mL Nebulization TID  . levothyroxine  50 mcg Oral QAC breakfast  . mouth rinse  15 mL Mouth Rinse BID  . methylPREDNISolone (SOLU-MEDROL) injection  40 mg Intravenous Daily  . protein supplement shake  11 oz Oral BID BM  . traZODone  25 mg Oral QHS  . cyanocobalamin  1,000 mcg Oral Daily    Assessment/Plan:  1. Acute diastolic congestive heart failure With hyponatremia. Continue IV Lasix, watch sodium closely. Recurrent pleural effusion, thoracocentesis done 2 days ago didn't show any malignancy from analysis. But patient had history of possible lung malignancy on the right side by CAT scan in December, was seen by Dr. Ann Maki and according to the note patient does not want to even hear about diagnosis of cancer,she refused bronchoscopy 2. Like to meet with family in front of the patient and discuss goals  of care because patient is not improving in terms of her breathing, by mouth intake. 3.  Continue oxygen. Sodium improved from 121-129. 4.  5. Acute kidney injury on chronic kidney disease stage III. Monitor with diuresis 6. Hyperkalemia. Improved after Kayexalate given 7. Atrial fibrillation. Rate controlled with amiodarone 8.  9.  10. History of lung cancer obstructing mass blocking the right bronchus. Overall prognosis poor. Initial thoracentesis negative for malignancy. Too sick right now for bronchoscopy.pt does not want to hear about  Diagnosis.will get oncology eval also to see if she can get PET scan and other non invasive studies now to help family and patient to make decisions. 11. History of hyponatremia. Sodium improved to 129.  12. Type 2 diabetes mellitus on sliding scale ,po take is really poor because of odynophagia.  13. Hyperlipidemia on statin 14. Hypothermia,  possible pneumonia. On Rocephin and Zithromax. Temperature came back up. Continue empiric antibiotics. 15. Poor air entry start nebulizer treatments with budesonide and Solu-Medrol 16. Thrush and difficulty swallowing. IV Diflucan. Speech therapy downgraded diet 17. Elevated troponin demand ischemia from CHF 18. palliative care consultation : CODE STATUS DO NOT RESUSCITATE. Poor prognosis  D/whusband   Code Status:     Code Status Orders        Start     Ordered   03/21/17 0732  Do not attempt resuscitation (DNR)  Continuous    Question Answer Comment  In the event of cardiac or respiratory ARREST  Do not call a "code blue"   In the event of cardiac or respiratory ARREST Do not perform Intubation, CPR, defibrillation or ACLS   In the event of cardiac or respiratory ARREST Use medication by any route, position, wound care, and other measures to relive pain and suffering. May use oxygen, suction and manual treatment of airway obstruction as needed for comfort.      03/21/17 0731    Code Status History    Date Active Date Inactive Code Status Order ID Comments User Context   03/13/2017  9:43 AM 03/14/2017 12:09 AM DNR [id number]  Melton Alar, PA-C Inpatient   03/08/2017  2:15 AM 03/13/2017  9:43 AM Partial Code [id number]  Harrie Foreman, MD ED   01/07/2017  7:26 PM 01/09/2017  5:42 PM Partial Code [id number]  Vaughan Basta, MD Inpatient   01/06/2017  3:21 PM 01/07/2017  7:26 PM Full Code [id number]  Epifanio Lesches, MD ED   11/28/2016  7:40 PM 11/29/2016  3:44 PM Full Code [id number]  Vaughan Basta, MD Inpatient    Advance Directive Documentation     Most Recent Value  Type of Advance Directive  Healthcare Power of Attorney  Pre-existing out of facility DNR order (yellow form or pink MOST form)  -  "MOST" Form in Place?  -     Family Communication: Daughter, Husband and son-in-law at the bedside  Disposition Plan: To be  determined  Antibiotics:  Rocephin  Zithromax  Time spent: 38 minutes, including ACP time  [facility]

## 2017-03-28 ENCOUNTER — Inpatient Hospital Stay: Payer: Medicare Other

## 2017-03-28 DIAGNOSIS — C7989 Secondary malignant neoplasm of other specified sites: Secondary | ICD-10-CM

## 2017-03-28 DIAGNOSIS — C3481 Malignant neoplasm of overlapping sites of right bronchus and lung: Secondary | ICD-10-CM

## 2017-03-28 DIAGNOSIS — C781 Secondary malignant neoplasm of mediastinum: Secondary | ICD-10-CM

## 2017-03-28 LAB — BASIC METABOLIC PANEL
ANION GAP: 7 (ref 5–15)
BUN: 53 mg/dL — AB (ref 6–20)
CALCIUM: 8.2 mg/dL — AB (ref 8.9–10.3)
CO2: 38 mmol/L — AB (ref 22–32)
Chloride: 84 mmol/L — ABNORMAL LOW (ref 101–111)
Creatinine, Ser: 1.16 mg/dL — ABNORMAL HIGH (ref 0.44–1.00)
GFR calc Af Amer: 47 mL/min — ABNORMAL LOW (ref >60)
GFR calc non Af Amer: 40 mL/min — ABNORMAL LOW (ref >60)
GLUCOSE: 154 mg/dL — AB (ref 65–99)
POTASSIUM: 3.3 mmol/L — AB (ref 3.5–5.1)
Sodium: 129 mmol/L — ABNORMAL LOW (ref 135–145)

## 2017-03-28 LAB — GLUCOSE, CAPILLARY
Glucose-Capillary: 185 mg/dL — ABNORMAL HIGH (ref 65–99)
Glucose-Capillary: 157 mg/dL — ABNORMAL HIGH (ref 65–99)
Glucose-Capillary: 179 mg/dL — ABNORMAL HIGH (ref 65–99)
Glucose-Capillary: 215 mg/dL — ABNORMAL HIGH (ref 65–99)

## 2017-03-28 LAB — BODY FLUID CULTURE: CULTURE: NO GROWTH

## 2017-03-28 MED ORDER — IOPAMIDOL (ISOVUE-300) INJECTION 61%
15.0000 mL | INTRAVENOUS | Status: AC
  Administered 2017-03-28 (×2): 15 mL via ORAL

## 2017-03-28 NOTE — Progress Notes (Signed)
Hematology/Oncology Consult note [facility]  Telephone:([phone] [phone]  Patient Care Team: Tonia Ghent, MD as PCP - General (Family Medicine) Minna Merritts, MD as Consulting Physician (Cardiology) Alisa Graff, FNP as Nurse Practitioner (Family Medicine) Flora Lipps, MD as Consulting Physician (Pulmonary Disease)   Name of the patient: Donna Lane  [id number]  April 10, 1925   Date of visit: 03/28/2017   Diagnosis- Stage IV lung cancer  Interval history- Patient continues to feel fatigued and SOB. Reports nausea  ECOG PS- 4  Review of systems- Review of Systems  Constitutional: Positive for malaise/fatigue and weight loss. Negative for chills and fever.  HENT: Negative for congestion, ear discharge and nosebleeds.   Eyes: Negative for blurred vision.  Respiratory: Positive for shortness of breath. Negative for cough, hemoptysis, sputum production and wheezing.   Cardiovascular: Negative for chest pain, palpitations, orthopnea and claudication.  Gastrointestinal: Negative for abdominal pain, blood in stool, constipation, diarrhea, heartburn, melena, nausea and vomiting.  Genitourinary: Negative for dysuria, flank pain, frequency, hematuria and urgency.  Musculoskeletal: Negative for back pain, joint pain and myalgias.  Skin: Negative for rash.  Neurological: Negative for dizziness, tingling, focal weakness, seizures, weakness and headaches.  Endo/Heme/Allergies: Does not bruise/bleed easily.  Psychiatric/Behavioral: Negative for depression and suicidal ideas. The patient does not have insomnia.       Allergies  Allergen Reactions  . Nsaids Other (See Comments)    Would avoid due to CKD.... Pt states she not allergic     Past Medical History:  Diagnosis Date  . Arrhythmia    atrial fibrillation  . Chronic diastolic heart failure ([place])   . CKD (chronic kidney disease), stage III   . COPD (chronic obstructive  pulmonary disease) ([place])   . Coronary artery disease 2008   CABG x 4,   . Diabetes mellitus   . Diverticulosis    on colonoscopy 08/12/2006  . Hyperlipidemia   . Hypertension   . Melanoma ([place])    removed by derm   . PVD (peripheral vascular disease) ([place])   . Spinal stenosis      Past Surgical History:  Procedure Laterality Date  . CARDIAC CATHETERIZATION     PTCA x 3  . CARDIOVERSION N/A 03/07/2017   Procedure: Cardioversion;  Surgeon: Minna Merritts, MD;  Location: [facility] ORS;  Service: Cardiovascular;  Laterality: N/A;  . CATARACT EXTRACTION    . CORONARY ARTERY BYPASS GRAFT  2008   x 4  . HIP FRACTURE SURGERY Left    pins placed w/o hip replacement, hardware removed later  . LUMBAR DISC SURGERY     in her 48s  . tonsillectomy    . VAGINAL DELIVERY     x2    Social History   Social History  . Marital status: Married    Spouse name: N/A  . Number of children: N/A  . Years of education: N/A   Occupational History  . Not on file.   Social History Main Topics  . Smoking status: Former Smoker    Packs/day: 1.00    Years: 50.00    Types: Cigarettes    Quit date: 03/20/2007  . Smokeless tobacco: Never Used  . Alcohol use No  . Drug use: No  . Sexual activity: No   Other Topics Concern  . Not on file   Social History Narrative   [place] [place]    Married 1953   2 kids, local.  Retired.     Family History  Problem Relation Age of Onset  . Heart disease Father   . Heart disease Sister   . Diabetes Sister      Current Facility-Administered Medications:  .  acetaminophen (TYLENOL) tablet 650 mg, 650 mg, Oral, Q6H PRN, 650 mg at 03/27/17 2124 **OR** acetaminophen (TYLENOL) suppository 650 mg, 650 mg, Rectal, Q6H PRN, Harrie Foreman, MD .  amiodarone (PACERONE) tablet 200 mg, 200 mg, Oral, BID, Harrie Foreman, MD, 200 mg at 03/28/17 1247 .  antiseptic oral rinse (BIOTENE) solution 15 mL, 15 mL, Mouth Rinse, PRN, Loletha Grayer, MD .   atorvastatin (LIPITOR) tablet 10 mg, 10 mg, Oral, q1800, Harrie Foreman, MD, 10 mg at 03/27/17 1748 .  budesonide (PULMICORT) nebulizer solution 0.5 mg, 0.5 mg, Nebulization, BID, Loletha Grayer, MD, 0.5 mg at 03/28/17 4540 .  cefTRIAXone (ROCEPHIN) 1 g in dextrose 5 % 50 mL IVPB, 1 g, Intravenous, Q24H, Loletha Grayer, MD, 1 g at 03/28/17 1300 .  diphenhydrAMINE (BENADRYL) capsule 25 mg, 25 mg, Oral, QHS PRN, Harrie Foreman, MD .  docusate sodium (COLACE) capsule 100 mg, 100 mg, Oral, BID, Harrie Foreman, MD, 100 mg at 03/28/17 1247 .  enoxaparin (LOVENOX) injection 50 mg, 1 mg/kg, Subcutaneous, Q24H, Epifanio Lesches, MD, 50 mg at 03/27/17 2124 .  fluconazole (DIFLUCAN) IVPB 100 mg, 100 mg, Intravenous, Q24H, Loletha Grayer, MD, 100 mg at 03/28/17 1527 .  furosemide (LASIX) injection 60 mg, 60 mg, Intravenous, BID, Loletha Grayer, MD, 60 mg at 03/28/17 1413 .  insulin aspart (novoLOG) injection 0-15 Units, 0-15 Units, Subcutaneous, TID WC, Epifanio Lesches, MD, 3 Units at 03/28/17 1300 .  insulin aspart (novoLOG) injection 0-5 Units, 0-5 Units, Subcutaneous, QHS, Epifanio Lesches, MD, 2 Units at 03/27/17 2124 .  ipratropium-albuterol (DUONEB) 0.5-2.5 (3) MG/3ML nebulizer solution 3 mL, 3 mL, Nebulization, TID, Loletha Grayer, MD, 3 mL at 03/28/17 1329 .  levothyroxine (SYNTHROID, LEVOTHROID) tablet 50 mcg, 50 mcg, Oral, QAC breakfast, Harrie Foreman, MD, 50 mcg at 03/28/17 [id number] .  MEDLINE mouth rinse, 15 mL, Mouth Rinse, BID, Loletha Grayer, MD, 15 mL at 03/28/17 1248 .  menthol-cetylpyridinium (CEPACOL) lozenge 3 mg, 1 lozenge, Oral, PRN, Harrie Foreman, MD .  methylPREDNISolone sodium succinate (SOLU-MEDROL) 40 mg/mL injection 40 mg, 40 mg, Intravenous, Daily, Loletha Grayer, MD, 40 mg at 03/28/17 1248 .  nitroGLYCERIN (NITROSTAT) SL tablet 0.4 mg, 0.4 mg, Sublingual, Q5 min PRN, Harrie Foreman, MD .  ondansetron [facility]) tablet 4 mg, 4 mg, Oral, Q6H PRN **OR**  ondansetron (ZOFRAN) injection 4 mg, 4 mg, Intravenous, Q6H PRN, Harrie Foreman, MD .  oxyCODONE (Oxy IR/ROXICODONE) immediate release tablet 2.5 mg, 2.5 mg, Oral, Q4H PRN, Harrie Foreman, MD, 2.5 mg at 03/22/17 2229 .  protein supplement (PREMIER PROTEIN) liquid, 11 oz, Oral, BID BM, Epifanio Lesches, MD, 11 oz at 03/28/17 1400 .  traZODone (DESYREL) tablet 25 mg, 25 mg, Oral, QHS, Harrie Foreman, MD, 25 mg at 03/27/17 2125 .  vitamin B-12 (CYANOCOBALAMIN) tablet 1,000 mcg, 1,000 mcg, Oral, Daily, Harrie Foreman, MD, 1,000 mcg at 03/28/17 1248 .  Zinc Oxide (TRIPLE PASTE) 12.8 % ointment, , Topical, PRN, Pershing Proud, NP  Physical exam:  Vitals:   03/28/17 0500 03/28/17 0507 03/28/17 1031 03/28/17 1251  BP:  (!) 115/53 (!) 88/45 (!) 105/45  Pulse:  93 90 93  Resp:  16  (!) 22  Temp:  97.5 F (36.4 C)  97.6 F (36.4 C)  TempSrc:  Oral  Oral  SpO2:  97% 100% 99%  Weight: 108 lb 11.2 oz (49.3 kg)     Height:       Physical Exam  Constitutional: She is oriented to person, place, and time.  Frail elderly. Appears fatigued  HENT:  Head: Normocephalic and atraumatic.  Eyes: EOM are normal. Pupils are equal, round, and reactive to light.  Neck: Normal range of motion.  Cardiovascular: Normal rate, regular rhythm and normal heart sounds.   Pulmonary/Chest: Effort normal.  Breath sounds decreased b/l diffusely  Abdominal: Soft. Bowel sounds are normal.  Neurological: She is alert and oriented to person, place, and time.  Skin: Skin is warm and dry.     CMP Latest Ref Rng & Units 03/28/2017  Glucose 65 - 99 mg/dL 154(H)  BUN 6 - 20 mg/dL 53(H)  Creatinine 0.44 - 1.00 mg/dL 1.16(H)  Sodium 135 - 145 mmol/L 129(L)  Potassium 3.5 - 5.1 mmol/L 3.3(L)  Chloride 101 - 111 mmol/L 84(L)  CO2 22 - 32 mmol/L 38(H)  Calcium 8.9 - 10.3 mg/dL 8.2(L)  Total Protein 6.5 - 8.1 g/dL -  Total Bilirubin 0.3 - 1.2 mg/dL -  Alkaline Phos 38 - 126 U/L -  AST 15 - 41 U/L -  ALT 14 -  54 U/L -   CBC Latest Ref Rng & Units 03/27/2017  WBC 3.6 - 11.0 K/uL 24.0(H)  Hemoglobin 12.0 - 16.0 g/dL 11.6(L)  Hematocrit 35.0 - 47.0 % 34.9(L)  Platelets 150 - 440 K/uL 185    '@IMAGES'$ @  Ct Abdomen Pelvis Wo Contrast  Result Date: 03/28/2017 CLINICAL DATA:  Recent pneumonia. Pleural effusion and lower extremity edema. Poor renal function. The patient had an obstructing mass in her right middle lobe on prior chest CT, but based on medical records from that time apparently refused any workup. EXAM: CT CHEST, ABDOMEN AND PELVIS WITHOUT CONTRAST TECHNIQUE: Multidetector CT imaging of the chest, abdomen and pelvis was performed following the standard protocol without IV contrast. COMPARISON:  11/28/2016 FINDINGS: Despite efforts by the technologist and patient, motion artifact is present on today's exam and could not be eliminated. This reduces exam sensitivity and specificity. CT CHEST FINDINGS Cardiovascular: Coronary, aortic arch, and branch vessel atherosclerotic vascular disease. Mediastinum/Nodes: Stable paratracheal lymph nodes including a 0.8 cm lower paratracheal lymph node on image 23/2. Lungs/Pleura: The right middle lobe mass has considerably enlarged, currently about 10.0 by 8.4 by 8.7 cm (volume = 380 cm^3), previously estimated at about 4.6 cm in diameter. This mass is now invading the mediastinum, compressing the SVC, an compressing or invading the a right atrium. There is continued postobstructive atelectasis of the right middle lobe as well as new complete obstruction of the right lower lobe bronchi by the mass. There is atelectasis of the right middle lobe and right lower lobe. Scattered in the lungs there are bilateral new pulmonary nodules favoring bilateral pulmonary metastatic disease. An index left upper lobe nodule measures 0.7 by 0.7 cm on image 22/4 but numerous additional nodules are present. Large right and moderate left pleural effusion, nonspecific for malignant effusion,  nonspecific for transudative versus exudative etiology. However given the mediastinal invasion, some pleural tumor involvement is expected on the right side. Musculoskeletal: Degenerative glenohumeral arthropathy, left greater than right. Thoracic spondylosis. Prior median sternotomy. Posterior osseous ridging at the T4-5 level. CT ABDOMEN PELVIS FINDINGS Hepatobiliary: There is some mild dependent density in the gallbladder which could represent sludge or possibly  small gallstones. Gallbladder is obscured by motion artifact. Pancreas: Unremarkable Spleen: Unremarkable Adrenals/Urinary Tract: Adrenal glands obscured by motion artifact, there is some mild stable fullness of the left adrenal gland without a discrete mass. Distended urinary bladder. No hydronephrosis. The patient has a known left mid kidney hypodense peripheral lesion which is nearly completely obscured on today' s exam due to motion artifact. Stomach/Bowel: Grossly unremarkable Vascular/Lymphatic: Aortoiliac atherosclerotic vascular disease. Reproductive: Unremarkable Other: Diffuse mild subcutaneous edema. Musculoskeletal: Deformity left proximal hip likely from old fracture, with evidence of prior left screw tracts. Degenerative arthropathy of both hips. Lumbar spondylosis and degenerative disc disease most notably at L3-4, L4-5, and L5-S 1 with impingement at all 3 of these levels. IMPRESSION: 1. The right middle lobe mass has increased over 7 times in volume over the past 4 months, and is now invading the mediastinum and possibly invading the right atrium. Scattered new bilateral pulmonary nodules now compatible with stage IV lung cancer. The mass obstructs the right middle lobe bronchi in the right lower lobe bronchi. 2. Large right and moderate left pleural effusions. 3. Other imaging findings of potential clinical significance: Coronary, aortic arch, and branch vessel atherosclerotic vascular disease. Aortoiliac atherosclerotic vascular  disease. Possible gallstones. Diffuse mild subcutaneous edema. Lumbar spondylosis and degenerative disc disease causing lower lumbar impingement. Electronically Signed   By: Van Clines M.D.   On: 03/28/2017 13:30   Dg Chest 1 View  Result Date: 03/27/2017 CLINICAL DATA:  83 year old female with a history of pleural effusion EXAM: CHEST 1 VIEW COMPARISON:  03/25/2017, 03/23/2017 FINDINGS: Cardiomediastinal silhouette likely unchanged with the right heart border partially obscured by lung/pleural disease. Calcifications of the aortic arch. Surgical changes median sternotomy and CABG. Left lung relatively well aerated. Minimal coarsened interstitial markings. Dense opacity at the right base, greater than the comparison plain film. Fluid within the minor fissure. No evidence of pneumothorax. IMPRESSION: Plain film demonstrates increasing right-sided pleural effusion and associated atelectasis/ consolidation. Aortic atherosclerosis. Surgical changes median sternotomy and CABG. Electronically Signed   By: Corrie Mckusick D.O.   On: 03/27/2017 14:21   Dg Chest 1 View  Result Date: 03/11/2017 CLINICAL DATA:  Status post right thoracentesis. EXAM: CHEST 1 VIEW COMPARISON:  Radiographs of March 07, 2017. FINDINGS: Stable cardiomediastinal silhouette. Atherosclerosis of thoracic aorta is noted. Status post coronary artery bypass graft. No pneumothorax is noted. Mild left pleural effusion is noted. Right pleural effusion is noted which is improved compared to prior exam. Bony thorax is unremarkable. IMPRESSION: No pneumothorax status post right-sided thoracentesis. Electronically Signed   By: Marijo Conception, M.D.   On: 03/11/2017 11:40   Dg Chest 2 View  Result Date: 03/07/2017 CLINICAL DATA:  Shortness of breath with leg swelling EXAM: CHEST  2 VIEW COMPARISON:  01/06/2017 FINDINGS: Post sternotomy changes. Small left-sided pleural effusion. Suspect moderate right-sided pleural effusion. There is  consolidation in the right middle lobe and right lower lobe. Stable cardiomegaly. No pneumothorax. IMPRESSION: 1. Small left pleural effusion. Suspect a moderate right pleural effusion. 2. Dense right middle lobe and lung base consolidation 3. Cardiomegaly Electronically Signed   By: Donavan Foil M.D.   On: 03/07/2017 23:13   Ct Chest Wo Contrast  Result Date: 03/28/2017 CLINICAL DATA:  Recent pneumonia. Pleural effusion and lower extremity edema. Poor renal function. The patient had an obstructing mass in her right middle lobe on prior chest CT, but based on medical records from that time apparently refused any workup. EXAM: CT CHEST, ABDOMEN AND  PELVIS WITHOUT CONTRAST TECHNIQUE: Multidetector CT imaging of the chest, abdomen and pelvis was performed following the standard protocol without IV contrast. COMPARISON:  11/28/2016 FINDINGS: Despite efforts by the technologist and patient, motion artifact is present on today's exam and could not be eliminated. This reduces exam sensitivity and specificity. CT CHEST FINDINGS Cardiovascular: Coronary, aortic arch, and branch vessel atherosclerotic vascular disease. Mediastinum/Nodes: Stable paratracheal lymph nodes including a 0.8 cm lower paratracheal lymph node on image 23/2. Lungs/Pleura: The right middle lobe mass has considerably enlarged, currently about 10.0 by 8.4 by 8.7 cm (volume = 380 cm^3), previously estimated at about 4.6 cm in diameter. This mass is now invading the mediastinum, compressing the SVC, an compressing or invading the a right atrium. There is continued postobstructive atelectasis of the right middle lobe as well as new complete obstruction of the right lower lobe bronchi by the mass. There is atelectasis of the right middle lobe and right lower lobe. Scattered in the lungs there are bilateral new pulmonary nodules favoring bilateral pulmonary metastatic disease. An index left upper lobe nodule measures 0.7 by 0.7 cm on image 22/4 but  numerous additional nodules are present. Large right and moderate left pleural effusion, nonspecific for malignant effusion, nonspecific for transudative versus exudative etiology. However given the mediastinal invasion, some pleural tumor involvement is expected on the right side. Musculoskeletal: Degenerative glenohumeral arthropathy, left greater than right. Thoracic spondylosis. Prior median sternotomy. Posterior osseous ridging at the T4-5 level. CT ABDOMEN PELVIS FINDINGS Hepatobiliary: There is some mild dependent density in the gallbladder which could represent sludge or possibly small gallstones. Gallbladder is obscured by motion artifact. Pancreas: Unremarkable Spleen: Unremarkable Adrenals/Urinary Tract: Adrenal glands obscured by motion artifact, there is some mild stable fullness of the left adrenal gland without a discrete mass. Distended urinary bladder. No hydronephrosis. The patient has a known left mid kidney hypodense peripheral lesion which is nearly completely obscured on today' s exam due to motion artifact. Stomach/Bowel: Grossly unremarkable Vascular/Lymphatic: Aortoiliac atherosclerotic vascular disease. Reproductive: Unremarkable Other: Diffuse mild subcutaneous edema. Musculoskeletal: Deformity left proximal hip likely from old fracture, with evidence of prior left screw tracts. Degenerative arthropathy of both hips. Lumbar spondylosis and degenerative disc disease most notably at L3-4, L4-5, and L5-S 1 with impingement at all 3 of these levels. IMPRESSION: 1. The right middle lobe mass has increased over 7 times in volume over the past 4 months, and is now invading the mediastinum and possibly invading the right atrium. Scattered new bilateral pulmonary nodules now compatible with stage IV lung cancer. The mass obstructs the right middle lobe bronchi in the right lower lobe bronchi. 2. Large right and moderate left pleural effusions. 3. Other imaging findings of potential clinical  significance: Coronary, aortic arch, and branch vessel atherosclerotic vascular disease. Aortoiliac atherosclerotic vascular disease. Possible gallstones. Diffuse mild subcutaneous edema. Lumbar spondylosis and degenerative disc disease causing lower lumbar impingement. Electronically Signed   By: Van Clines M.D.   On: 03/28/2017 13:30   US Venous Img Upper Uni Left  Result Date: 03/10/2017 CLINICAL DATA:  Left hand swelling EXAM: LEFT UPPER EXTREMITY VENOUS DOPPLER ULTRASOUND TECHNIQUE: Gray-scale sonography with graded compression, as well as color Doppler and duplex ultrasound were performed to evaluate the upper extremity deep venous system from the level of the subclavian vein and including the jugular, axillary, basilic, radial, ulnar and upper cephalic vein. Spectral Doppler was utilized to evaluate flow at rest and with distal augmentation maneuvers. COMPARISON:  None. FINDINGS: Contralateral Subclavian Vein:  Respiratory phasicity is normal and symmetric with the symptomatic side. No evidence of thrombus. Normal compressibility. Internal Jugular Vein: No evidence of thrombus. Normal compressibility, respiratory phasicity and response to augmentation. Subclavian Vein: No evidence of thrombus. Normal compressibility, respiratory phasicity and response to augmentation. Axillary Vein: No evidence of thrombus. Normal compressibility, respiratory phasicity and response to augmentation. Cephalic Vein: No evidence of thrombus. Normal compressibility, respiratory phasicity and response to augmentation. Basilic Vein: No evidence of thrombus. Normal compressibility, respiratory phasicity and response to augmentation. Brachial Veins: No evidence of thrombus. Normal compressibility, respiratory phasicity and response to augmentation. Radial Veins: No evidence of thrombus. Normal compressibility, respiratory phasicity and response to augmentation. Ulnar Veins: No evidence of thrombus. Normal compressibility,  respiratory phasicity and response to augmentation. Venous Reflux:  None visualized. Other Findings:  None visualized. IMPRESSION: No evidence of deep venous thrombosis. Electronically Signed   By: Julian Hy M.D.   On: 03/10/2017 16:33   Dg Chest Port 1 View  Result Date: 03/25/2017 CLINICAL DATA:  Post thoracentesis EXAM: PORTABLE CHEST 1 VIEW COMPARISON:  03/23/2017 FINDINGS: Decreasing right effusion following thoracentesis. Moderate right effusion remains. No pneumothorax. Right base atelectasis. Heart is borderline in size. Prior CABG. No focal opacity on the left. IMPRESSION: Moderate right effusion, decreasing following right thoracentesis. No pneumothorax. Electronically Signed   By: Rolm Baptise M.D.   On: 03/25/2017 09:45   Dg Chest Port 1 View  Result Date: 03/23/2017 CLINICAL DATA:  83 y/o  F; cough.  History of lung cancer. EXAM: PORTABLE CHEST 1 VIEW COMPARISON:  03/21/2017 chest radiograph. FINDINGS: Stable cardiomegaly partially obscured by large right effusion. Aortic atherosclerosis with calcification. Post CABG with sternotomy wires and alignment. Large right and small left pleural effusions, probably increased on the right from prior chest radiograph. Right mid and lower lung zone opacification. IMPRESSION: Large right and small left pleural effusions probably increased on the right in comparison with prior chest radiograph. Opacification of right mid and lower lung zones and left lower lobe may represent atelectasis or pneumonia. Right lung mass is obscured by the effusion. Electronically Signed   By: Kristine Garbe M.D.   On: 03/23/2017 05:53   Dg Chest Port 1 View  Result Date: 03/21/2017 CLINICAL DATA:  Weakness and lightheadedness. Lower extremity edema. EXAM: PORTABLE CHEST 1 VIEW COMPARISON:  03/11/2017 FINDINGS: Pleural effusions bilaterally, increased from 03/11/2017. Diffuse interstitial fluid or thickening. Moderate vascular prominence. Central and basilar  airspace opacities, right greater than left. IMPRESSION: The findings probably represent congestive heart failure with interstitial and alveolar edema as well as bilateral pleural effusions. Infectious infiltrates are not entirely excluded. Electronically Signed   By: Andreas Newport M.D.   On: 03/21/2017 01:52   US Thoracentesis Asp Pleural Space W/img Guide  Result Date: 03/25/2017 INDICATION: Right pleural effusion EXAM: ULTRASOUND GUIDED RIGHT THORACENTESIS MEDICATIONS: None. COMPLICATIONS: None immediate. PROCEDURE: An ultrasound guided thoracentesis was thoroughly discussed with the patient and questions answered. The benefits, risks, alternatives and complications were also discussed. The patient understands and wishes to proceed with the procedure. Written consent was obtained. Ultrasound was performed to localize and mark an adequate pocket of fluid in the right chest. The area was then prepped and draped in the normal sterile fashion. 1% Lidocaine was used for local anesthesia. Under ultrasound guidance a Safe-T-Centesis catheter was introduced. Thoracentesis was performed. The catheter was removed and a dressing applied. FINDINGS: A total of approximately 1 L of clear yellow fluid was removed. Samples were sent to the  laboratory as requested by the clinical team. IMPRESSION: Successful ultrasound guided right thoracentesis yielding 1 L of pleural fluid. Electronically Signed   By: Marybelle Killings M.D.   On: 03/25/2017 13:07   US Thoracentesis Asp Pleural Space W/img Guide  Result Date: 03/11/2017 INDICATION: Right pleural effusion. EXAM: ULTRASOUND GUIDED right THORACENTESIS MEDICATIONS: None. COMPLICATIONS: None immediate. PROCEDURE: An ultrasound guided thoracentesis was thoroughly discussed with the patient and questions answered. The benefits, risks, alternatives and complications were also discussed. The patient understands and wishes to proceed with the procedure. Written consent was  obtained. Ultrasound was performed to localize and mark an adequate pocket of fluid in the right chest. The area was then prepped and draped in the normal sterile fashion. 1% Lidocaine was used for local anesthesia. Under ultrasound guidance a 8 Fr Safe-T-Centesis catheter was introduced. Thoracentesis was performed. The catheter was removed and a dressing applied. FINDINGS: A total of approximately 1500 mL of serous fluid was removed. Samples were sent to the laboratory as requested by the clinical team. IMPRESSION: Successful ultrasound guided right thoracentesis yielding 1.5 L of pleural fluid. Electronically Signed   By: Marijo Conception, M.D.   On: 03/11/2017 11:41     Assessment and plan- Patient is a 83 y.o. female with stage IV lung cancer  I discussed the results of the CT chest abdomen and pelvis with the patient in person as well as her daughter [name] over the phone.CT thorax shows a large lung mass with mediastinal invasion and possible invasion of the heart as well as bilateral pulmonary nodules and bilateral pleural effusions. This is consistent with stage IV lung cancer on the basis of the scan and the absence of tissue diagnosis which the patient does not desire. Patient does not desire any chemotherapy and she is not a candidate for the same either. I discussed that palliative options at this time would be palliative radiation to the large lung mass to palliate her dyspnea as well as consideration for Pleurx catheter given reaccumulation of her pleural effusions. Patient is agreeable to speaking to radiation oncology at this time but would like to think about Pleurx catheter. I will also given all this information to [place] over the phone who would discuss this with her brother as well as patient further over the weekend. Her overall prognosis is poor <6 months and she would ideally benefit with hsopice services. Recommend palliative care input to further discussions with family on Monday.  Recommend rad onc consultation on Monday and discussing pleurex catheter with IR as well  We will be available for any further questions.      Visit Diagnosis 1. Generalized weakness   2. Peripheral edema   3. Hyponatremia   4. Elevated troponin   5. Renal insufficiency   6. Cough   7. Pleural effusion on right   8. S/P thoracentesis   9. Pleural effusion   10. Lung cancer [facility])      Dr. Randa Evens, MD, MPH [facility] at [facility] Pager- [phone] 03/28/2017 4:44 PM

## 2017-03-28 NOTE — Progress Notes (Signed)
Patient ID: Donna Lane, female   DOB: July 16, 1925, 83 y.o.   MRN: [id number]   [place] PROGRESS NOTE  Donna Lane TTS:[id number] DOB: 1925/10/31 DOA: 03/21/2017 PCP: Donna Stain, MD  HPI/Subjective:  Cough, mild shortness of breath. No improvement in swallowing. I want to repeat chest x-ray today, get GI consult for difficulty swallowing.  Objective: Vitals:   03/28/17 1031 03/28/17 1251  BP: (!) 88/45 (!) 105/45  Pulse: 90 93  Resp:  (!) 22  Temp:  97.6 F (36.4 C)    Filed Weights   03/26/17 0500 03/27/17 0411 03/28/17 0500  Weight: 42 kg (92 lb 9.6 oz) 49 kg (108 lb 1.6 oz) 49.3 kg (108 lb 11.2 oz)    ROS: Review of Systems  Constitutional: Negative for chills and fever.  Eyes: Negative for blurred vision.  Respiratory: Positive for cough and shortness of breath.   Cardiovascular: Negative for chest pain.  Gastrointestinal: Positive for nausea. Negative for abdominal pain, constipation, diarrhea and vomiting.  Genitourinary: Negative for dysuria.  Musculoskeletal: Negative for joint pain.  Neurological: Positive for weakness. Negative for dizziness and headaches.   Exam: Physical Exam  Constitutional: She is oriented to person, place, and time.  HENT:  Nose: No mucosal edema.  Mouth/Throat: No oropharyngeal exudate or posterior oropharyngeal edema.  Eyes: Conjunctivae, EOM and lids are normal. Pupils are equal, round, and reactive to light.  Neck: No JVD present. Carotid bruit is not present. No edema present. No thyroid mass and no thyromegaly present.  Cardiovascular: S1 normal and S2 normal.  Exam reveals no gallop.   No murmur heard. Pulses:      Dorsalis pedis pulses are 2+ on the right side, and 2+ on the left side.  Respiratory: No respiratory distress. She has decreased breath sounds in the right middle field, the right lower field, the left middle field and the left lower field. She has no wheezes. She has rhonchi in the right middle  field and the left middle field. She has rales in the right lower field.  GI: Soft. Bowel sounds are normal. There is no tenderness.  Musculoskeletal:       Right wrist: She exhibits swelling.       Right ankle: She exhibits swelling.       Left ankle: She exhibits swelling.  Lymphadenopathy:    She has no cervical adenopathy.  Neurological: She is alert and oriented to person, place, and time. No cranial nerve deficit.  Skin: Skin is warm. No rash noted. Nails show no clubbing.  Psychiatric: She has a normal mood and affect.      Data Reviewed: Basic Metabolic Panel:  Recent Labs Lab 03/23/17 0434 03/23/17 1506 03/24/17 0306 03/25/17 0539 03/27/17 0605 03/28/17 1204  NA 122* 125* 126* 129*  --  129*  K 5.7* 4.2 4.0 3.7  --  3.3*  CL 85* 86* 86* 87*  --  84*  CO2 '27 29 30 31  '$ --  38*  GLUCOSE 215* 261* 192* 217*  --  154*  BUN 45* 47* 45* 47*  --  53*  CREATININE 1.73* 1.77* 1.65* 1.46* 1.38* 1.16*  CALCIUM 8.4* 8.1* 8.1* 8.3*  --  8.2*   CBC:  Recent Labs Lab 03/22/17 0327 03/24/17 0306 03/25/17 0539 03/27/17 0605  WBC 30.5* 17.4* 22.8* 24.0*  HGB 10.5* 10.7* 11.2* 11.6*  HCT 31.4* 32.9* 34.2* 34.9*  MCV 83.7 85.0 83.7 84.9  PLT 212 202 235 185   Cardiac Enzymes: No  results for input(s): CKTOTAL, CKMB, CKMBINDEX, TROPONINI in the last 168 hours. BNP (last 3 results)  Recent Labs  01/06/17 1044 03/07/17 2238 03/21/17 0209  BNP 362.0* 182.0* 226.0*      Studies: Ct Abdomen Pelvis Wo Contrast  Result Date: 03/28/2017 CLINICAL DATA:  Recent pneumonia. Pleural effusion and lower extremity edema. Poor renal function. The patient had an obstructing mass in her right middle lobe on prior chest CT, but based on medical records from that time apparently refused any workup. EXAM: CT CHEST, ABDOMEN AND PELVIS WITHOUT CONTRAST TECHNIQUE: Multidetector CT imaging of the chest, abdomen and pelvis was performed following the standard protocol without IV contrast.  COMPARISON:  11/28/2016 FINDINGS: Despite efforts by the technologist and patient, motion artifact is present on today's exam and could not be eliminated. This reduces exam sensitivity and specificity. CT CHEST FINDINGS Cardiovascular: Coronary, aortic arch, and branch vessel atherosclerotic vascular disease. Mediastinum/Nodes: Stable paratracheal lymph nodes including a 0.8 cm lower paratracheal lymph node on image 23/2. Lungs/Pleura: The right middle lobe mass has considerably enlarged, currently about 10.0 by 8.4 by 8.7 cm (volume = 380 cm^3), previously estimated at about 4.6 cm in diameter. This mass is now invading the mediastinum, compressing the SVC, an compressing or invading the a right atrium. There is continued postobstructive atelectasis of the right middle lobe as well as new complete obstruction of the right lower lobe bronchi by the mass. There is atelectasis of the right middle lobe and right lower lobe. Scattered in the lungs there are bilateral new pulmonary nodules favoring bilateral pulmonary metastatic disease. An index left upper lobe nodule measures 0.7 by 0.7 cm on image 22/4 but numerous additional nodules are present. Large right and moderate left pleural effusion, nonspecific for malignant effusion, nonspecific for transudative versus exudative etiology. However given the mediastinal invasion, some pleural tumor involvement is expected on the right side. Musculoskeletal: Degenerative glenohumeral arthropathy, left greater than right. Thoracic spondylosis. Prior median sternotomy. Posterior osseous ridging at the T4-5 level. CT ABDOMEN PELVIS FINDINGS Hepatobiliary: There is some mild dependent density in the gallbladder which could represent sludge or possibly small gallstones. Gallbladder is obscured by motion artifact. Pancreas: Unremarkable Spleen: Unremarkable Adrenals/Urinary Tract: Adrenal glands obscured by motion artifact, there is some mild stable fullness of the left adrenal  gland without a discrete mass. Distended urinary bladder. No hydronephrosis. The patient has a known left mid kidney hypodense peripheral lesion which is nearly completely obscured on today' s exam due to motion artifact. Stomach/Bowel: Grossly unremarkable Vascular/Lymphatic: Aortoiliac atherosclerotic vascular disease. Reproductive: Unremarkable Other: Diffuse mild subcutaneous edema. Musculoskeletal: Deformity left proximal hip likely from old fracture, with evidence of prior left screw tracts. Degenerative arthropathy of both hips. Lumbar spondylosis and degenerative disc disease most notably at L3-4, L4-5, and L5-S 1 with impingement at all 3 of these levels. IMPRESSION: 1. The right middle lobe mass has increased over 7 times in volume over the past 4 months, and is now invading the mediastinum and possibly invading the right atrium. Scattered new bilateral pulmonary nodules now compatible with stage IV lung cancer. The mass obstructs the right middle lobe bronchi in the right lower lobe bronchi. 2. Large right and moderate left pleural effusions. 3. Other imaging findings of potential clinical significance: Coronary, aortic arch, and branch vessel atherosclerotic vascular disease. Aortoiliac atherosclerotic vascular disease. Possible gallstones. Diffuse mild subcutaneous edema. Lumbar spondylosis and degenerative disc disease causing lower lumbar impingement. Electronically Signed   By: Cindra Eves.D.  On: 03/28/2017 13:30   Dg Chest 1 View  Result Date: 03/27/2017 CLINICAL DATA:  83 year old female with a history of pleural effusion EXAM: CHEST 1 VIEW COMPARISON:  03/25/2017, 03/23/2017 FINDINGS: Cardiomediastinal silhouette likely unchanged with the right heart border partially obscured by lung/pleural disease. Calcifications of the aortic arch. Surgical changes median sternotomy and CABG. Left lung relatively well aerated. Minimal coarsened interstitial markings. Dense opacity at the right  base, greater than the comparison plain film. Fluid within the minor fissure. No evidence of pneumothorax. IMPRESSION: Plain film demonstrates increasing right-sided pleural effusion and associated atelectasis/ consolidation. Aortic atherosclerosis. Surgical changes median sternotomy and CABG. Electronically Signed   By: Corrie Mckusick D.O.   On: 03/27/2017 14:21   Ct Chest Wo Contrast  Result Date: 03/28/2017 CLINICAL DATA:  Recent pneumonia. Pleural effusion and lower extremity edema. Poor renal function. The patient had an obstructing mass in her right middle lobe on prior chest CT, but based on medical records from that time apparently refused any workup. EXAM: CT CHEST, ABDOMEN AND PELVIS WITHOUT CONTRAST TECHNIQUE: Multidetector CT imaging of the chest, abdomen and pelvis was performed following the standard protocol without IV contrast. COMPARISON:  11/28/2016 FINDINGS: Despite efforts by the technologist and patient, motion artifact is present on today's exam and could not be eliminated. This reduces exam sensitivity and specificity. CT CHEST FINDINGS Cardiovascular: Coronary, aortic arch, and branch vessel atherosclerotic vascular disease. Mediastinum/Nodes: Stable paratracheal lymph nodes including a 0.8 cm lower paratracheal lymph node on image 23/2. Lungs/Pleura: The right middle lobe mass has considerably enlarged, currently about 10.0 by 8.4 by 8.7 cm (volume = 380 cm^3), previously estimated at about 4.6 cm in diameter. This mass is now invading the mediastinum, compressing the SVC, an compressing or invading the a right atrium. There is continued postobstructive atelectasis of the right middle lobe as well as new complete obstruction of the right lower lobe bronchi by the mass. There is atelectasis of the right middle lobe and right lower lobe. Scattered in the lungs there are bilateral new pulmonary nodules favoring bilateral pulmonary metastatic disease. An index left upper lobe nodule measures  0.7 by 0.7 cm on image 22/4 but numerous additional nodules are present. Large right and moderate left pleural effusion, nonspecific for malignant effusion, nonspecific for transudative versus exudative etiology. However given the mediastinal invasion, some pleural tumor involvement is expected on the right side. Musculoskeletal: Degenerative glenohumeral arthropathy, left greater than right. Thoracic spondylosis. Prior median sternotomy. Posterior osseous ridging at the T4-5 level. CT ABDOMEN PELVIS FINDINGS Hepatobiliary: There is some mild dependent density in the gallbladder which could represent sludge or possibly small gallstones. Gallbladder is obscured by motion artifact. Pancreas: Unremarkable Spleen: Unremarkable Adrenals/Urinary Tract: Adrenal glands obscured by motion artifact, there is some mild stable fullness of the left adrenal gland without a discrete mass. Distended urinary bladder. No hydronephrosis. The patient has a known left mid kidney hypodense peripheral lesion which is nearly completely obscured on today' s exam due to motion artifact. Stomach/Bowel: Grossly unremarkable Vascular/Lymphatic: Aortoiliac atherosclerotic vascular disease. Reproductive: Unremarkable Other: Diffuse mild subcutaneous edema. Musculoskeletal: Deformity left proximal hip likely from old fracture, with evidence of prior left screw tracts. Degenerative arthropathy of both hips. Lumbar spondylosis and degenerative disc disease most notably at L3-4, L4-5, and L5-S 1 with impingement at all 3 of these levels. IMPRESSION: 1. The right middle lobe mass has increased over 7 times in volume over the past 4 months, and is now invading the mediastinum and possibly  invading the right atrium. Scattered new bilateral pulmonary nodules now compatible with stage IV lung cancer. The mass obstructs the right middle lobe bronchi in the right lower lobe bronchi. 2. Large right and moderate left pleural effusions. 3. Other imaging  findings of potential clinical significance: Coronary, aortic arch, and branch vessel atherosclerotic vascular disease. Aortoiliac atherosclerotic vascular disease. Possible gallstones. Diffuse mild subcutaneous edema. Lumbar spondylosis and degenerative disc disease causing lower lumbar impingement. Electronically Signed   By: Van Clines M.D.   On: 03/28/2017 13:30    Scheduled Meds: . amiodarone  200 mg Oral BID  . atorvastatin  10 mg Oral q1800  . budesonide (PULMICORT) nebulizer solution  0.5 mg Nebulization BID  . cefTRIAXone (ROCEPHIN)  IV  1 g Intravenous Q24H  . docusate sodium  100 mg Oral BID  . enoxaparin (LOVENOX) injection  1 mg/kg Subcutaneous Q24H  . fluconazole (DIFLUCAN) IV  100 mg Intravenous Q24H  . furosemide  60 mg Intravenous BID  . insulin aspart  0-15 Units Subcutaneous TID WC  . insulin aspart  0-5 Units Subcutaneous QHS  . ipratropium-albuterol  3 mL Nebulization TID  . levothyroxine  50 mcg Oral QAC breakfast  . mouth rinse  15 mL Mouth Rinse BID  . methylPREDNISolone (SOLU-MEDROL) injection  40 mg Intravenous Daily  . protein supplement shake  11 oz Oral BID BM  . traZODone  25 mg Oral QHS  . cyanocobalamin  1,000 mcg Oral Daily    Assessment/Plan:  1. Stage 4 lung  Cancer;tumor  7 times in 4 months, now invading mediastinum,  Right atrium,patient has right  and left pleural effusion. Same discussed with patient's son, daughter and they now understand that patient has progressive lung cancer, spoke with Dr. Randa Evens and explained about the findings and she will talk to her daughter. Patient daughter and son does not want patient to know about this diagnosis of lung cancer. 2.  3. Acute kidney injury on chronic kidney disease stage III. Monitor with diuresis 4. Hyperkalemia. Improved after Kayexalate given 5. Atrial fibrillation. Rate controlled with amiodarone 6.  7.  8. History of lung cancer obstructing mass blocking the right bronchus. Overall  prognosis poor. Initial thoracentesis negative for malignancy. Too sick right now for bronchoscopy.pt does not want to hear about  Diagnosis.will get oncology eval also to see if she can get PET scan and other non invasive studies now to help family and patient to make decisions. 9. History of hyponatremia. Sodium improved to 129.  10. Type 2 diabetes mellitus on sliding scale ,po take is really poor because of odynophagia.  11. Hyperlipidemia on statin 12. Hypothermia, possible pneumonia. On Rocephin and Zithromax. Temperature came back up. Continue empiric antibiotics. 13. Poor air entry start nebulizer treatments with budesonide and Solu-Medrol 14. Thrush and difficulty swallowing. IV Diflucan. Speech therapy downgraded diet 15. Elevated troponin demand ischemia from CHF Prognosis  poor, family does not want hospice home placement, will decide about home with hospice   16. palliative care consultation : CODE STATUS DO NOT RESUSCITATE. Poor prognosis  D/whusband   Code Status:     Code Status Orders        Start     Ordered   03/21/17 0732  Do not attempt resuscitation (DNR)  Continuous    Question Answer Comment  In the event of cardiac or respiratory ARREST Do not call a "code blue"   In the event of cardiac or respiratory ARREST Do not perform Intubation,  CPR, defibrillation or ACLS   In the event of cardiac or respiratory ARREST Use medication by any route, position, wound care, and other measures to relive pain and suffering. May use oxygen, suction and manual treatment of airway obstruction as needed for comfort.      03/21/17 0731    Code Status History    Date Active Date Inactive Code Status Order ID Comments User Context   03/13/2017  9:43 AM 03/14/2017 12:09 AM DNR [id number]  Melton Alar, PA-C Inpatient   03/08/2017  2:15 AM 03/13/2017  9:43 AM Partial Code [id number]  Harrie Foreman, MD ED   01/07/2017  7:26 PM 01/09/2017  5:42 PM Partial Code [id number]  Vaughan Basta, MD Inpatient   01/06/2017  3:21 PM 01/07/2017  7:26 PM Full Code [id number]  Epifanio Lesches, MD ED   11/28/2016  7:40 PM 11/29/2016  3:44 PM Full Code [id number]  Vaughan Basta, MD Inpatient    Advance Directive Documentation     Most Recent Value  Type of Advance Directive  Healthcare Power of Attorney  Pre-existing out of facility DNR order (yellow form or pink MOST form)  -  "MOST" Form in Place?  -     Family Communication: Daughter, Husband and son-in-law at the bedside  Disposition Plan: To be determined  Antibiotics:  Rocephin  Zithromax  Time spent: 38 minutes, including ACP time  [employer]

## 2017-03-28 NOTE — Progress Notes (Addendum)
Inpatient Diabetes Program Recommendations  AACE/ADA: New Consensus Statement on Inpatient Glycemic Control (2015)  Target Ranges:  Prepandial:   less than 140 mg/dL      Peak postprandial:   less than 180 mg/dL (1-2 hours)      Critically ill patients:  140 - 180 mg/dL   Lab Results  Component Value Date   GLUCAP 157 (H) 03/28/2017   HGBA1C 7.5 (H) 01/07/2017    Review of Glycemic Control  Results for Donna Lane, Donna Lane (MRN [id number]) as of 03/28/2017 13:41  Ref. Range 03/27/2017 11:34 03/27/2017 16:48 03/27/2017 21:06 03/28/2017 07:49 03/28/2017 12:49  Glucose-Capillary Latest Ref Range: 65 - 99 mg/dL 213 (H) 189 (H) 217 (H) 185 (H) 157 (H)    Diabetes history:DM2 Outpatient Diabetes medications: Levemir 13units QHS Novolog 0-10 units tid with meals  Current orders for Inpatient glycemic control:Novolog 0-15 units tid, Novolog 0-5 units qhs  Inpatient Diabetes Program Recommendations: Agree with current medications for blood sugar management. If the steroid is stopped- please decrease Novolog correction to sensitive scale 0-9 units tid.  Gentry Fitz, RN, BA, MHA, CDE Diabetes Coordinator Inpatient Diabetes Program  [phone] (Team Pager) [phone] ([place]) 03/28/2017 1:43 PM

## 2017-03-28 NOTE — Care Management Important Message (Signed)
Important Message  Patient Details  Name: Donna Lane MRN: [id number] Date of Birth: 06/29/1925   Medicare Important Message Given:  Yes    Beverly Sessions, RN 03/28/2017, 12:14 PM

## 2017-03-29 LAB — GLUCOSE, CAPILLARY
Glucose-Capillary: 181 mg/dL — ABNORMAL HIGH (ref 65–99)
Glucose-Capillary: 188 mg/dL — ABNORMAL HIGH (ref 65–99)
Glucose-Capillary: 415 mg/dL — ABNORMAL HIGH (ref 65–99)
Glucose-Capillary: 387 mg/dL — ABNORMAL HIGH (ref 65–99)

## 2017-03-29 MED ORDER — POTASSIUM CHLORIDE 2 MEQ/ML IV SOLN
30.0000 meq | Freq: Once | INTRAVENOUS | Status: AC
  Administered 2017-03-29: 30 meq via INTRAVENOUS
  Filled 2017-03-29: qty 15

## 2017-03-29 NOTE — Plan of Care (Signed)
Problem: Physical Regulation: Goal: Will remain free from infection Outcome: Progressing Educated the patient on the need for contact isolation to prevent infection

## 2017-03-29 NOTE — Progress Notes (Signed)
SLP Cancellation Note  Patient Details Name: MAKINZIE CONSIDINE MRN: [id number] DOB: 11/26/1925   Cancelled treatment:        SLP attempted trials of NDDS 1 and NDDS2 trials, however pt refused stating she can only drink orange juice and shakes and cannot swallow any other foods, even pudding. SLP educated pt on need to attempt other foods and pt refused. She continues to have complaints of a sore throat.    [place] Sauber 03/29/2017, 11:17 AM

## 2017-03-29 NOTE — Progress Notes (Signed)
Patient ID: Donna Lane, female   DOB: 1925-11-12, 83 y.o.   MRN: [id number]   [facility] PROGRESS NOTE  Donna Lane TIW:[id number] DOB: 1925/12/02 DOA: 03/21/2017 PCP: [name], MD  HPI/Subjective:  Shortness of breath which is worse also has dysphagia. CT chest confirmed a stage IV lung cancer ,patient is aware of diagnosis.  Objective: Vitals:   03/29/17 0359 03/29/17 0827  BP: (!) 102/58 (!) 101/39  Pulse: 90 (!) 105  Resp: 18 18  Temp: 97.6 F (36.4 C) 97.7 F (36.5 C)    Filed Weights   03/26/17 0500 03/27/17 0411 03/28/17 0500  Weight: 42 kg (92 lb 9.6 oz) 49 kg (108 lb 1.6 oz) 49.3 kg (108 lb 11.2 oz)    ROS: Review of Systems  Constitutional: Negative for chills and fever.  Eyes: Negative for blurred vision.  Respiratory: Positive for cough and shortness of breath.   Cardiovascular: Negative for chest pain.  Gastrointestinal: Positive for nausea. Negative for abdominal pain, constipation, diarrhea and vomiting.  Genitourinary: Negative for dysuria.  Musculoskeletal: Negative for joint pain.  Neurological: Positive for weakness. Negative for dizziness and headaches.   Exam: Physical Exam  Constitutional: She is oriented to person, place, and time.  HENT:  Nose: No mucosal edema.  Mouth/Throat: No oropharyngeal exudate or posterior oropharyngeal edema.  Eyes: Conjunctivae, EOM and lids are normal. Pupils are equal, round, and reactive to light.  Neck: No JVD present. Carotid bruit is not present. No edema present. No thyroid mass and no thyromegaly present.  Cardiovascular: S1 normal and S2 normal.  Exam reveals no gallop.   No murmur heard. Pulses:      Dorsalis pedis pulses are 2+ on the right side, and 2+ on the left side.  Respiratory: No respiratory distress. She has decreased breath sounds in the right middle field, the right lower field, the left middle field and the left lower field. She has no wheezes. She has rhonchi in the right  middle field and the left middle field. She has rales in the right lower field.  GI: Soft. Bowel sounds are normal. There is no tenderness.  Musculoskeletal:       Right wrist: She exhibits swelling.       Right ankle: She exhibits swelling.       Left ankle: She exhibits swelling.  Lymphadenopathy:    She has no cervical adenopathy.  Neurological: She is alert and oriented to person, place, and time. No cranial nerve deficit.  Skin: Skin is warm. No rash noted. Nails show no clubbing.  Psychiatric: She has a normal mood and affect.      Data Reviewed: Basic Metabolic Panel:  Recent Labs Lab 03/23/17 0434 03/23/17 1506 03/24/17 0306 03/25/17 0539 03/27/17 0605 03/28/17 1204  NA 122* 125* 126* 129*  --  129*  K 5.7* 4.2 4.0 3.7  --  3.3*  CL 85* 86* 86* 87*  --  84*  CO2 '27 29 30 31  '$ --  38*  GLUCOSE 215* 261* 192* 217*  --  154*  BUN 45* 47* 45* 47*  --  53*  CREATININE 1.73* 1.77* 1.65* 1.46* 1.38* 1.16*  CALCIUM 8.4* 8.1* 8.1* 8.3*  --  8.2*   CBC:  Recent Labs Lab 03/24/17 0306 03/25/17 0539 03/27/17 0605  WBC 17.4* 22.8* 24.0*  HGB 10.7* 11.2* 11.6*  HCT 32.9* 34.2* 34.9*  MCV 85.0 83.7 84.9  PLT 202 235 185   Cardiac Enzymes: No results for input(s): CKTOTAL,  CKMB, CKMBINDEX, TROPONINI in the last 168 hours. BNP (last 3 results)  Recent Labs  01/06/17 1044 03/07/17 2238 03/21/17 0209  BNP 362.0* 182.0* 226.0*      Studies: Ct Abdomen Pelvis Wo Contrast  Result Date: 03/28/2017 CLINICAL DATA:  Recent pneumonia. Pleural effusion and lower extremity edema. Poor renal function. The patient had an obstructing mass in her right middle lobe on prior chest CT, but based on medical records from that time apparently refused any workup. EXAM: CT CHEST, ABDOMEN AND PELVIS WITHOUT CONTRAST TECHNIQUE: Multidetector CT imaging of the chest, abdomen and pelvis was performed following the standard protocol without IV contrast. COMPARISON:  11/28/2016 FINDINGS:  Despite efforts by the technologist and patient, motion artifact is present on today's exam and could not be eliminated. This reduces exam sensitivity and specificity. CT CHEST FINDINGS Cardiovascular: Coronary, aortic arch, and branch vessel atherosclerotic vascular disease. Mediastinum/Nodes: Stable paratracheal lymph nodes including a 0.8 cm lower paratracheal lymph node on image 23/2. Lungs/Pleura: The right middle lobe mass has considerably enlarged, currently about 10.0 by 8.4 by 8.7 cm (volume = 380 cm^3), previously estimated at about 4.6 cm in diameter. This mass is now invading the mediastinum, compressing the SVC, an compressing or invading the a right atrium. There is continued postobstructive atelectasis of the right middle lobe as well as new complete obstruction of the right lower lobe bronchi by the mass. There is atelectasis of the right middle lobe and right lower lobe. Scattered in the lungs there are bilateral new pulmonary nodules favoring bilateral pulmonary metastatic disease. An index left upper lobe nodule measures 0.7 by 0.7 cm on image 22/4 but numerous additional nodules are present. Large right and moderate left pleural effusion, nonspecific for malignant effusion, nonspecific for transudative versus exudative etiology. However given the mediastinal invasion, some pleural tumor involvement is expected on the right side. Musculoskeletal: Degenerative glenohumeral arthropathy, left greater than right. Thoracic spondylosis. Prior median sternotomy. Posterior osseous ridging at the T4-5 level. CT ABDOMEN PELVIS FINDINGS Hepatobiliary: There is some mild dependent density in the gallbladder which could represent sludge or possibly small gallstones. Gallbladder is obscured by motion artifact. Pancreas: Unremarkable Spleen: Unremarkable Adrenals/Urinary Tract: Adrenal glands obscured by motion artifact, there is some mild stable fullness of the left adrenal gland without a discrete mass.  Distended urinary bladder. No hydronephrosis. The patient has a known left mid kidney hypodense peripheral lesion which is nearly completely obscured on today' s exam due to motion artifact. Stomach/Bowel: Grossly unremarkable Vascular/Lymphatic: Aortoiliac atherosclerotic vascular disease. Reproductive: Unremarkable Other: Diffuse mild subcutaneous edema. Musculoskeletal: Deformity left proximal hip likely from old fracture, with evidence of prior left screw tracts. Degenerative arthropathy of both hips. Lumbar spondylosis and degenerative disc disease most notably at L3-4, L4-5, and L5-S 1 with impingement at all 3 of these levels. IMPRESSION: 1. The right middle lobe mass has increased over 7 times in volume over the past 4 months, and is now invading the mediastinum and possibly invading the right atrium. Scattered new bilateral pulmonary nodules now compatible with stage IV lung cancer. The mass obstructs the right middle lobe bronchi in the right lower lobe bronchi. 2. Large right and moderate left pleural effusions. 3. Other imaging findings of potential clinical significance: Coronary, aortic arch, and branch vessel atherosclerotic vascular disease. Aortoiliac atherosclerotic vascular disease. Possible gallstones. Diffuse mild subcutaneous edema. Lumbar spondylosis and degenerative disc disease causing lower lumbar impingement. Electronically Signed   By: Van Clines M.D.   On: 03/28/2017 13:30  Dg Chest 1 View  Result Date: 03/27/2017 CLINICAL DATA:  83 year old female with a history of pleural effusion EXAM: CHEST 1 VIEW COMPARISON:  03/25/2017, 03/23/2017 FINDINGS: Cardiomediastinal silhouette likely unchanged with the right heart border partially obscured by lung/pleural disease. Calcifications of the aortic arch. Surgical changes median sternotomy and CABG. Left lung relatively well aerated. Minimal coarsened interstitial markings. Dense opacity at the right base, greater than the comparison  plain film. Fluid within the minor fissure. No evidence of pneumothorax. IMPRESSION: Plain film demonstrates increasing right-sided pleural effusion and associated atelectasis/ consolidation. Aortic atherosclerosis. Surgical changes median sternotomy and CABG. Electronically Signed   By: Corrie Mckusick D.O.   On: 03/27/2017 14:21   Ct Chest Wo Contrast  Result Date: 03/28/2017 CLINICAL DATA:  Recent pneumonia. Pleural effusion and lower extremity edema. Poor renal function. The patient had an obstructing mass in her right middle lobe on prior chest CT, but based on medical records from that time apparently refused any workup. EXAM: CT CHEST, ABDOMEN AND PELVIS WITHOUT CONTRAST TECHNIQUE: Multidetector CT imaging of the chest, abdomen and pelvis was performed following the standard protocol without IV contrast. COMPARISON:  11/28/2016 FINDINGS: Despite efforts by the technologist and patient, motion artifact is present on today's exam and could not be eliminated. This reduces exam sensitivity and specificity. CT CHEST FINDINGS Cardiovascular: Coronary, aortic arch, and branch vessel atherosclerotic vascular disease. Mediastinum/Nodes: Stable paratracheal lymph nodes including a 0.8 cm lower paratracheal lymph node on image 23/2. Lungs/Pleura: The right middle lobe mass has considerably enlarged, currently about 10.0 by 8.4 by 8.7 cm (volume = 380 cm^3), previously estimated at about 4.6 cm in diameter. This mass is now invading the mediastinum, compressing the SVC, an compressing or invading the a right atrium. There is continued postobstructive atelectasis of the right middle lobe as well as new complete obstruction of the right lower lobe bronchi by the mass. There is atelectasis of the right middle lobe and right lower lobe. Scattered in the lungs there are bilateral new pulmonary nodules favoring bilateral pulmonary metastatic disease. An index left upper lobe nodule measures 0.7 by 0.7 cm on image 22/4 but  numerous additional nodules are present. Large right and moderate left pleural effusion, nonspecific for malignant effusion, nonspecific for transudative versus exudative etiology. However given the mediastinal invasion, some pleural tumor involvement is expected on the right side. Musculoskeletal: Degenerative glenohumeral arthropathy, left greater than right. Thoracic spondylosis. Prior median sternotomy. Posterior osseous ridging at the T4-5 level. CT ABDOMEN PELVIS FINDINGS Hepatobiliary: There is some mild dependent density in the gallbladder which could represent sludge or possibly small gallstones. Gallbladder is obscured by motion artifact. Pancreas: Unremarkable Spleen: Unremarkable Adrenals/Urinary Tract: Adrenal glands obscured by motion artifact, there is some mild stable fullness of the left adrenal gland without a discrete mass. Distended urinary bladder. No hydronephrosis. The patient has a known left mid kidney hypodense peripheral lesion which is nearly completely obscured on today' s exam due to motion artifact. Stomach/Bowel: Grossly unremarkable Vascular/Lymphatic: Aortoiliac atherosclerotic vascular disease. Reproductive: Unremarkable Other: Diffuse mild subcutaneous edema. Musculoskeletal: Deformity left proximal hip likely from old fracture, with evidence of prior left screw tracts. Degenerative arthropathy of both hips. Lumbar spondylosis and degenerative disc disease most notably at L3-4, L4-5, and L5-S 1 with impingement at all 3 of these levels. IMPRESSION: 1. The right middle lobe mass has increased over 7 times in volume over the past 4 months, and is now invading the mediastinum and possibly invading the right atrium. Scattered  new bilateral pulmonary nodules now compatible with stage IV lung cancer. The mass obstructs the right middle lobe bronchi in the right lower lobe bronchi. 2. Large right and moderate left pleural effusions. 3. Other imaging findings of potential clinical  significance: Coronary, aortic arch, and branch vessel atherosclerotic vascular disease. Aortoiliac atherosclerotic vascular disease. Possible gallstones. Diffuse mild subcutaneous edema. Lumbar spondylosis and degenerative disc disease causing lower lumbar impingement. Electronically Signed   By: Van Clines M.D.   On: 03/28/2017 13:30    Scheduled Meds: . amiodarone  200 mg Oral BID  . atorvastatin  10 mg Oral q1800  . budesonide (PULMICORT) nebulizer solution  0.5 mg Nebulization BID  . cefTRIAXone (ROCEPHIN)  IV  1 g Intravenous Q24H  . docusate sodium  100 mg Oral BID  . enoxaparin (LOVENOX) injection  1 mg/kg Subcutaneous Q24H  . fluconazole (DIFLUCAN) IV  100 mg Intravenous Q24H  . furosemide  60 mg Intravenous BID  . insulin aspart  0-15 Units Subcutaneous TID WC  . insulin aspart  0-5 Units Subcutaneous QHS  . ipratropium-albuterol  3 mL Nebulization TID  . levothyroxine  50 mcg Oral QAC breakfast  . mouth rinse  15 mL Mouth Rinse BID  . methylPREDNISolone (SOLU-MEDROL) injection  40 mg Intravenous Daily  . protein supplement shake  11 oz Oral BID BM  . traZODone  25 mg Oral QHS  . cyanocobalamin  1,000 mcg Oral Daily    Assessment/Plan:  1. Stage 4 lung  Cancer;tumor  7 times in 4 months, now invading mediastinum,  Right atrium,patient has right  and left pleural effusion. Same discussed with patient's son, daughter and they now understand that patient has progressive lung cancer,pt also knows about this and willing to talk to radiation oncologist on Monday. 2. Stage 4 lung cancer; patient to get radiation oncology evaluation on Monday , family and patient to discuss about option for Pleurx catheter and I will put IR  To do pleurx cath 3. Acute kidney injury on chronic kidney disease stage III. Monitor with diuresis 4. Hyperkalemia. Improved after Kayexalate given 5. Atrial fibrillation. Rate controlled with amiodarone  History of hyponatremia. Sodium improved to 129.   6. Type 2 diabetes mellitus on sliding scale ,po take is really poor because of odynophagia.  7. Hyperlipidemia on statin 8. Hypothermia, possible pneumonia. On Rocephin and Zithromax. Temperature came back up. Continue empiric antibiotics. 9. Poor air entry start nebulizer treatments with budesonide and Solu-Medrol 10. Thrush and difficulty swallowing. IV Diflucan. Speech therapy downgraded diet 11. Elevated troponin demand ischemia from CHF Prognosis  poor, family does not want hospice home placement, will decide about home with hospice   12. palliative care consultation : CODE STATUS DO NOT RESUSCITATE. Poor prognosis  D/whusband   Code Status:     Code Status Orders        Start     Ordered   03/21/17 0732  Do not attempt resuscitation (DNR)  Continuous    Question Answer Comment  In the event of cardiac or respiratory ARREST Do not call a "code blue"   In the event of cardiac or respiratory ARREST Do not perform Intubation, CPR, defibrillation or ACLS   In the event of cardiac or respiratory ARREST Use medication by any route, position, wound care, and other measures to relive pain and suffering. May use oxygen, suction and manual treatment of airway obstruction as needed for comfort.      03/21/17 0731    Code Status  History    Date Active Date Inactive Code Status Order ID Comments User Context   03/13/2017  9:43 AM 03/14/2017 12:09 AM DNR [id number]  Melton Alar, PA-C Inpatient   03/08/2017  2:15 AM 03/13/2017  9:43 AM Partial Code [id number]  Harrie Foreman, MD ED   01/07/2017  7:26 PM 01/09/2017  5:42 PM Partial Code [id number]  Vaughan Basta, MD Inpatient   01/06/2017  3:21 PM 01/07/2017  7:26 PM Full Code [id number]  Epifanio Lesches, MD ED   11/28/2016  7:40 PM 11/29/2016  3:44 PM Full Code [id number]  Vaughan Basta, MD Inpatient    Advance Directive Documentation     Most Recent Value  Type of Advance Directive  Healthcare Power of Attorney   Pre-existing out of facility DNR order (yellow form or pink MOST form)  -  "MOST" Form in Place?  -     Family Communication: Daughter, Husband and son-in-law at the bedside  Disposition Plan: To be determined  Antibiotics:  Rocephin  Zithromax  Time spent: 38 minutes, including ACP time  [employer]

## 2017-03-29 NOTE — Progress Notes (Signed)
Gave patient 12 units of novolog per Dr Vianne Bulls for a CBG of 415 since the patient has a poor appetite.

## 2017-03-30 ENCOUNTER — Inpatient Hospital Stay: Payer: Medicare Other

## 2017-03-30 LAB — GLUCOSE, CAPILLARY
Glucose-Capillary: 341 mg/dL — ABNORMAL HIGH (ref 65–99)
Glucose-Capillary: 348 mg/dL — ABNORMAL HIGH (ref 65–99)
Glucose-Capillary: 377 mg/dL — ABNORMAL HIGH (ref 65–99)
Glucose-Capillary: 214 mg/dL — ABNORMAL HIGH (ref 65–99)

## 2017-03-30 MED ORDER — INSULIN DETEMIR 100 UNIT/ML ~~LOC~~ SOLN
10.0000 [IU] | Freq: Every day | SUBCUTANEOUS | Status: DC
  Administered 2017-03-30 – 2017-04-04 (×6): 10 [IU] via SUBCUTANEOUS
  Filled 2017-03-30 (×7): qty 0.1

## 2017-03-30 MED ORDER — IPRATROPIUM-ALBUTEROL 0.5-2.5 (3) MG/3ML IN SOLN
3.0000 mL | Freq: Two times a day (BID) | RESPIRATORY_TRACT | Status: DC
  Administered 2017-03-30 – 2017-04-08 (×17): 3 mL via RESPIRATORY_TRACT
  Filled 2017-03-30 (×17): qty 3

## 2017-03-30 NOTE — Progress Notes (Signed)
Patient ID: Donna Lane, female   DOB: 1925-03-15, 83 y.o.   MRN: [id number]   [facility] PROGRESS NOTE  Donna Lane IBB:[id number] DOB: [birth year] DOA: 03/21/2017 PCP: [name], MD  HPI/Subjective:  says swallowing  is slightly better today. Has shortness of breath, cough.  Objective: Vitals:   03/30/17 1003 03/30/17 1222  BP: (!) 107/51 (!) 123/57  Pulse: 97 89  Resp: 18 18  Temp:  98.4 F (36.9 C)    Filed Weights   03/26/17 0500 03/27/17 0411 03/28/17 0500  Weight: 42 kg (92 lb 9.6 oz) 49 kg (108 lb 1.6 oz) 49.3 kg (108 lb 11.2 oz)    ROS: Review of Systems  Constitutional: Negative for chills and fever.  Eyes: Negative for blurred vision.  Respiratory: Positive for cough and shortness of breath.   Cardiovascular: Negative for chest pain.  Gastrointestinal: Positive for nausea. Negative for abdominal pain, constipation, diarrhea and vomiting.  Genitourinary: Negative for dysuria.  Musculoskeletal: Negative for joint pain.  Neurological: Positive for weakness. Negative for dizziness and headaches.   Exam: Physical Exam  Constitutional: She is oriented to person, place, and time.  HENT:  Nose: No mucosal edema.  Mouth/Throat: No oropharyngeal exudate or posterior oropharyngeal edema.  Eyes: Conjunctivae, EOM and lids are normal. Pupils are equal, round, and reactive to light.  Neck: No JVD present. Carotid bruit is not present. No edema present. No thyroid mass and no thyromegaly present.  Cardiovascular: S1 normal and S2 normal.  Exam reveals no gallop.   No murmur heard. Pulses:      Dorsalis pedis pulses are 2+ on the right side, and 2+ on the left side.  Respiratory: No respiratory distress. She has decreased breath sounds in the right middle field, the right lower field, the left middle field and the left lower field. She has no wheezes. She has rhonchi in the right middle field and the left middle field. She has rales in the right lower  field.  GI: Soft. Bowel sounds are normal. There is no tenderness.  Musculoskeletal:       Right wrist: She exhibits swelling.       Right ankle: She exhibits swelling.       Left ankle: She exhibits swelling.  Lymphadenopathy:    She has no cervical adenopathy.  Neurological: She is alert and oriented to person, place, and time. No cranial nerve deficit.  Skin: Skin is warm. No rash noted. Nails show no clubbing.  Psychiatric: She has a normal mood and affect.      Data Reviewed: Basic Metabolic Panel:  Recent Labs Lab 03/23/17 1506 03/24/17 0306 03/25/17 0539 03/27/17 0605 03/28/17 1204  NA 125* 126* 129*  --  129*  K 4.2 4.0 3.7  --  3.3*  CL 86* 86* 87*  --  84*  CO2 '29 30 31  '$ --  38*  GLUCOSE 261* 192* 217*  --  154*  BUN 47* 45* 47*  --  53*  CREATININE 1.77* 1.65* 1.46* 1.38* 1.16*  CALCIUM 8.1* 8.1* 8.3*  --  8.2*   CBC:  Recent Labs Lab 03/24/17 0306 03/25/17 0539 03/27/17 0605  WBC 17.4* 22.8* 24.0*  HGB 10.7* 11.2* 11.6*  HCT 32.9* 34.2* 34.9*  MCV 85.0 83.7 84.9  PLT 202 235 185   Cardiac Enzymes: No results for input(s): CKTOTAL, CKMB, CKMBINDEX, TROPONINI in the last 168 hours. BNP (last 3 results)  Recent Labs  01/06/17 1044 03/07/17 2238 03/21/17 0209  BNP  362.0* 182.0* 226.0*      Studies: No results found.  Scheduled Meds: . amiodarone  200 mg Oral BID  . atorvastatin  10 mg Oral q1800  . budesonide (PULMICORT) nebulizer solution  0.5 mg Nebulization BID  . cefTRIAXone (ROCEPHIN)  IV  1 g Intravenous Q24H  . docusate sodium  100 mg Oral BID  . enoxaparin (LOVENOX) injection  1 mg/kg Subcutaneous Q24H  . fluconazole (DIFLUCAN) IV  100 mg Intravenous Q24H  . furosemide  60 mg Intravenous BID  . insulin aspart  0-15 Units Subcutaneous TID WC  . insulin aspart  0-5 Units Subcutaneous QHS  . ipratropium-albuterol  3 mL Nebulization BID  . levothyroxine  50 mcg Oral QAC breakfast  . mouth rinse  15 mL Mouth Rinse BID  .  methylPREDNISolone (SOLU-MEDROL) injection  40 mg Intravenous Daily  . protein supplement shake  11 oz Oral BID BM  . traZODone  25 mg Oral QHS  . cyanocobalamin  1,000 mcg Oral Daily    Assessment/Plan:  1. Stage 4 lung  Cancer;tumor  7 times in 4 months, now invading mediastinum,  Right atrium,patient has right  and left pleural effusion. Same discussed with patient's son, daughter and they now understand that patient has progressive lung cancer,pt also knows about this and willing to talk to radiation oncologist on Monday. 2. Stage 4 lung cancer; patient to get radiation oncology evaluation on Monday , family and patient to discuss about option for Pleurx catheter and I will put IR  To do pleurx cath 3. Acute kidney injury on chronic kidney disease stage III. Monitor with diuresis 4. Hyperkalemia. Improved after Kayexalate given 5. Atrial fibrillation. Rate controlled with amiodarone  History of hyponatremia. Sodium improved to 129.  6. Type 2 diabetes mellitus on sliding scale ,po take is really poor because of odynophagia. Hyperglycemia with blood sugars more than 350. We will add small dose Lantus. 7.  8. Hyperlipidemia on statin 9. Post obstructive pneumonia;On Rocephin and Zithromax. TePoor air entry start nebulizer treatments with budesonide and Solu-Medrol 10. Thrush and difficulty swallowing. IV Diflucan. Speech therapy downgraded diet Elevated troponin demand ischemia from CHF  Prognosis  poor, family does not want hospice home placement, will decide about home with hospice   11. palliative care consultation : CODE STATUS DO NOT RESUSCITATE. Poor prognosis  D/whusband   Code Status:     Code Status Orders        Start     Ordered   03/21/17 0732  Do not attempt resuscitation (DNR)  Continuous    Question Answer Comment  In the event of cardiac or respiratory ARREST Do not call a "code blue"   In the event of cardiac or respiratory ARREST Do not perform Intubation,  CPR, defibrillation or ACLS   In the event of cardiac or respiratory ARREST Use medication by any route, position, wound care, and other measures to relive pain and suffering. May use oxygen, suction and manual treatment of airway obstruction as needed for comfort.      03/21/17 0731    Code Status History    Date Active Date Inactive Code Status Order ID Comments User Context   03/13/2017  9:43 AM 03/14/2017 12:09 AM DNR [id number]  Melton Alar, PA-C Inpatient   03/08/2017  2:15 AM 03/13/2017  9:43 AM Partial Code [id number]  Harrie Foreman, MD ED   01/07/2017  7:26 PM 01/09/2017  5:42 PM Partial Code [id number]  Vaughan Basta, MD Inpatient  01/06/2017  3:21 PM 01/07/2017  7:26 PM Full Code [id number]  Epifanio Lesches, MD ED   11/28/2016  7:40 PM 11/29/2016  3:44 PM Full Code [id number]  Vaughan Basta, MD Inpatient    Advance Directive Documentation     Most Recent Value  Type of Advance Directive  Healthcare Power of Attorney  Pre-existing out of facility DNR order (yellow form or pink MOST form)  -  "MOST" Form in Place?  -     Family Communication: Daughter, Husband and son-in-law at the bedside  Disposition Plan: To be determined  Antibiotics:  Rocephin  Zithromax  Time spent: 38 minutes, including ACP time  [employer]

## 2017-03-30 NOTE — Plan of Care (Signed)
Problem: Skin Integrity: Goal: Risk for impaired skin integrity will decrease Outcome: Progressing Explained the need of daily dressing changes of her sacral wound to patient

## 2017-03-30 NOTE — Progress Notes (Signed)
[facility] Hematology/Oncology Progress Note  Date of admission: 03/21/2017  Hospital day:  03/30/2017  Chief Complaint: Donna Lane is a 83 y.o. female with metastatic lung cancer who was admitted with heart failure and acute on chronic renal insufficiency.   HPI:  Patient last seen by me during her inpatient hospitalization on 03/08/2017.  At that time, images were reviewed with the patient.  She was admanant that she did not have lung cancer.  She agreed to thoracentesis for potential diagnosis.  Thoracentesis x 2 (03/11/2017 and 03/25/2017) were negative.  She did not agree to an invasive biopsy.   Chest, abdomen, and pelvic CT scan on 03/28/2017 revealed a 10 x 8.4 x 8.7 cm right middle lobe mass (previously 4.6 cm on 11/28/2016).  Mass invades the mediastinum, compresses the SVC and right atrium.  There is postobstructive atelectasis of the right middle lobe as well as new complete obstruction of the right lower lobe bronchi by the mass.  There are scattered bilateral pulmonary nodules (new).  There is a large right and moderate left pleural effusion.  She presented with general weakness and lower extremity weeping.  She and her family are aware of her scans and probable stage IV lung cancer.  She is agreeable to speaking with radiation oncology as well as possible right sided Pleur-X catheter.  Subjective:   She notes dizziness for the past couple of days.  She denies any focal weakness or numbness.  She denies any visual changes.  She has fatigue and chronic shortness of breath.  She feels achy all over.  Social History: The patient is alone today. 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  She states that he has a feeding tube.   She states that her medical power of attorney is her daughter, [name].    Allergies:  Allergies  Allergen Reactions  . Nsaids Other (See Comments)    Would avoid due to CKD.... Pt states she not allergic    Scheduled  Medications: . amiodarone  200 mg Oral BID  . atorvastatin  10 mg Oral q1800  . budesonide (PULMICORT) nebulizer solution  0.5 mg Nebulization BID  . cefTRIAXone (ROCEPHIN)  IV  1 g Intravenous Q24H  . docusate sodium  100 mg Oral BID  . enoxaparin (LOVENOX) injection  1 mg/kg Subcutaneous Q24H  . fluconazole (DIFLUCAN) IV  100 mg Intravenous Q24H  . furosemide  60 mg Intravenous BID  . insulin aspart  0-15 Units Subcutaneous TID WC  . insulin aspart  0-5 Units Subcutaneous QHS  . insulin detemir  10 Units Subcutaneous Daily  . ipratropium-albuterol  3 mL Nebulization BID  . levothyroxine  50 mcg Oral QAC breakfast  . mouth rinse  15 mL Mouth Rinse BID  . methylPREDNISolone (SOLU-MEDROL) injection  40 mg Intravenous Daily  . protein supplement shake  11 oz Oral BID BM  . traZODone  25 mg Oral QHS  . cyanocobalamin  1,000 mcg Oral Daily    Review of Systems: GENERAL:  Fatigue. No fevers or sweats. PERFORMANCE STATUS (ECOG): 2 HEENT:  No visual changes, runny nose, sore throat, mouth sores or tenderness. Lungs: Shortness of breath.  Cough.  No hemoptysis. Cardiac:  No chest pain, palpitations, orthopnea, or PND.  Atrial fibrillation. GI:  Poor appetite.  Nausea.  No vomiting, diarrhea, constipation, melena or hematochezia. GU:  No urgency, frequency, dysuria, or hematuria. Musculoskeletal:  Achy all over.  No muscle tenderness. Extremities: Lower extremity swelling. Skin:  No rashes or skin changes. Neuro:  Dizziness.  No focal weakness or numbness.  No headache or visual changes. Endocrine:  Diabetes.  No thyroid issues, hot flashes or night sweats. Psych:  No mood changes, depression or anxiety. Pain:  Achy all over. Review of systems:  All other systems reviewed and found to be negative  Physical Exam: Blood pressure (!) 123/57, pulse 89, temperature 98.4 F (36.9 C), temperature source Oral, resp. rate 18, height '5\' 2"'$  (1.575 m), weight 108 lb 11.2 oz (49.3 kg), SpO2 98 %.   GENERAL:  Elderly chronically fatigued woman lying bed on the medical unit in no acute distress. MENTAL STATUS:  Alert and oriented to person, place and time. HEAD:  Short graying hair.  Normocephalic, atraumatic, face symmetric, no Cushingoid features.  No evidence of SVC syndrome. EYES:  Brown eyes.  Pupils equal round and reactive to light and accomodation.  No conjunctivitis or scleral icterus. ENT:  Oropharynx clear without lesion.  Tongue normal. Mucous membranes moist.  RESPIRATORY:  Decreased breath sounds right chest anteriorly.  Breath sounds heard right upper lobe. CARDIOVASCULAR:  Regular rate and rhythm without murmur, rub or gallop. ABDOMEN:  Soft, non-tender, with active bowel sounds, and no hepatosplenomegaly.  No masses. SKIN:  No rashes, ulcers or lesions. EXTREMITIES:  Ankle edema.  No skin discoloration or tenderness.  No palpable cords. LYMPH NODES: No palpable cervical, supraclavicular, axillary or inguinal adenopathy  NEUROLOGICAL: Unremarkable. PSYCH:  Appropriate.   Results for orders placed or performed during the hospital encounter of 03/21/17 (from the past 48 hour(s))  Glucose, capillary     Status: Abnormal   Collection Time: 03/28/17  4:34 PM  Result Value Ref Range   Glucose-Capillary 179 (H) 65 - 99 mg/dL  Glucose, capillary     Status: Abnormal   Collection Time: 03/28/17  9:43 PM  Result Value Ref Range   Glucose-Capillary 215 (H) 65 - 99 mg/dL  Glucose, capillary     Status: Abnormal   Collection Time: 03/29/17  7:29 AM  Result Value Ref Range   Glucose-Capillary 181 (H) 65 - 99 mg/dL  Glucose, capillary     Status: Abnormal   Collection Time: 03/29/17 11:39 AM  Result Value Ref Range   Glucose-Capillary 188 (H) 65 - 99 mg/dL  Glucose, capillary     Status: Abnormal   Collection Time: 03/29/17  5:00 PM  Result Value Ref Range   Glucose-Capillary 415 (H) 65 - 99 mg/dL  Glucose, capillary     Status: Abnormal   Collection Time: 03/29/17  9:55  PM  Result Value Ref Range   Glucose-Capillary 387 (H) 65 - 99 mg/dL  Glucose, capillary     Status: Abnormal   Collection Time: 03/30/17  7:39 AM  Result Value Ref Range   Glucose-Capillary 341 (H) 65 - 99 mg/dL  Glucose, capillary     Status: Abnormal   Collection Time: 03/30/17 11:47 AM  Result Value Ref Range   Glucose-Capillary 348 (H) 65 - 99 mg/dL   No results found.  Assessment:  NATELIE OSTROSKY is a 83 y.o. female with probable metastatic right sided lung cancer with right middle lobe and lower lobe collapse.  She has a moderate right sided effusion. She has hyponatremia possible due to SIADH.  Chest, abdomen, and pelvic CT on 03/28/2017 revealed a 10 x 8.4 x 8.7 cm right middle lobe mass (previously 4.6 cm on 11/28/2016).  Mass invades the mediastinum, compresses the SVC and right atrium.  There is postobstructive atelectasis of the right middle lobe as well as new complete obstruction of the right lower lobe bronchi by the mass.  There are scattered bilateral pulmonary nodules (new).  There is a large right and moderate left pleural effusion.  Plan:   1.  Oncology:   I reviewed the conversation she had with Dr. Janese Banks.  We discussed her thoughts about therapy.  She would like to talk to Dr. Baruch Gouty, radiation oncologist, on Monday.  She is considering Pleur-X catheter for her recurrent right sided effusion.  She is aware that treatment is palliative.  She will talk to palliative care services.  She has been resistant to consider Hospice given her experience with them and other family members/friends in the past.  Multiple questions were asked and answered.  2.  Neurology:  Discussed concern regarding sensation of dizziness.  Symptoms possibly due to impending SVC syndrome or CNS metastasis.  Discussed head CT without contrast.  3.  Code status:   DNR/DNI.   Lequita Asal, MD  03/30/2017, 1:04 PM

## 2017-03-31 ENCOUNTER — Ambulatory Visit: Payer: Medicare Other | Admitting: Hematology and Oncology

## 2017-03-31 ENCOUNTER — Ambulatory Visit: Payer: Medicare Other | Attending: Radiation Oncology | Admitting: Radiation Oncology

## 2017-03-31 ENCOUNTER — Inpatient Hospital Stay: Payer: Medicare Other

## 2017-03-31 DIAGNOSIS — Z66 Do not resuscitate: Secondary | ICD-10-CM

## 2017-03-31 DIAGNOSIS — C3490 Malignant neoplasm of unspecified part of unspecified bronchus or lung: Secondary | ICD-10-CM

## 2017-03-31 DIAGNOSIS — C342 Malignant neoplasm of middle lobe, bronchus or lung: Secondary | ICD-10-CM

## 2017-03-31 LAB — APTT: aPTT: 25 seconds (ref 24–36)

## 2017-03-31 LAB — CBC
HEMATOCRIT: 36.7 % (ref 35.0–47.0)
Hemoglobin: 12 g/dL (ref 12.0–16.0)
MCH: 27.6 pg (ref 26.0–34.0)
MCHC: 32.6 g/dL (ref 32.0–36.0)
MCV: 84.6 fL (ref 80.0–100.0)
Platelets: 186 10*3/uL (ref 150–440)
RBC: 4.33 MIL/uL (ref 3.80–5.20)
RDW: 16.2 % — ABNORMAL HIGH (ref 11.5–14.5)
WBC: 27 10*3/uL — AB (ref 3.6–11.0)

## 2017-03-31 LAB — BASIC METABOLIC PANEL
ANION GAP: 9 (ref 5–15)
BUN: 79 mg/dL — ABNORMAL HIGH (ref 6–20)
CO2: 39 mmol/L — AB (ref 22–32)
Calcium: 8.6 mg/dL — ABNORMAL LOW (ref 8.9–10.3)
Chloride: 89 mmol/L — ABNORMAL LOW (ref 101–111)
Creatinine, Ser: 1.22 mg/dL — ABNORMAL HIGH (ref 0.44–1.00)
GFR calc non Af Amer: 38 mL/min — ABNORMAL LOW (ref >60)
GFR, EST AFRICAN AMERICAN: 44 mL/min — AB (ref >60)
Glucose, Bld: 69 mg/dL (ref 65–99)
Potassium: 4.3 mmol/L (ref 3.5–5.1)
SODIUM: 137 mmol/L (ref 135–145)

## 2017-03-31 LAB — GLUCOSE, CAPILLARY
Glucose-Capillary: 78 mg/dL (ref 65–99)
Glucose-Capillary: 305 mg/dL — ABNORMAL HIGH (ref 65–99)
Glucose-Capillary: 387 mg/dL — ABNORMAL HIGH (ref 65–99)
Glucose-Capillary: 282 mg/dL — ABNORMAL HIGH (ref 65–99)
Glucose-Capillary: 240 mg/dL — ABNORMAL HIGH (ref 65–99)

## 2017-03-31 LAB — PROTIME-INR
INR: 0.95
PROTHROMBIN TIME: 12.7 s (ref 11.4–15.2)

## 2017-03-31 NOTE — Care Management (Signed)
Spoke with patient and son in [employer].  They have decided to pursue palliative radiation and wait on pleurx because radiation oncologist Son states  "the radiation doctor said the radiation may take care of all of that fluid".  If it doesn't- they can just go back in and drain it again with a needle." Discussed that the catheter would prevent fluid build up and make it easier for patient to breathe while assessing the effectiveness of the radiation and informed that "I do not want Momma to have to empty that tube".  CM attempted to discuss emptying the tube would not be difficult and son said "Cena Benton- if you know what you are doing."  CM at this point did not pursue this conversation topic  further out of concern for upsetting son in front of the patient.  Patient herself said when asked if she wants the tube to drain the fluid she stated- "I think I want to wait and see."   This CM has had discussion with patient during prior admission about whether there were financial resources available  to pay for in home continuous care should she decide to go home and patient never really answered.  At present, she does not appear to  have rehab potential for skilled nursing placement under medicare.  Patient would like to return home.  Returning home with hospice / home health has been mentioned but patient would have to have more caregiver support than appears to be present.

## 2017-03-31 NOTE — Progress Notes (Signed)
Nutrition Follow-up  DOCUMENTATION CODES:   Severe malnutrition in context of chronic illness, Underweight  INTERVENTION:  Continue Premier Protein po BID between meals, each supplement provides 160 kcal and 30 grams of protein.  Continue Magic cup TID with meals, each supplement provides 290 kcal and 9 grams of protein. Discussed with family how they can purchase Magic Cup after discharge if patient would like to continue eating them.  NUTRITION DIAGNOSIS:   Malnutrition (Severe) related to chronic illness (COPD, CHF, obstructing lung mass, thrush) as evidenced by severe depletion of body fat, severe depletion of muscle mass.  Ongoing.  GOAL:   Patient will meet greater than or equal to 90% of their needs  Progressing.  MONITOR:   PO intake, Supplement acceptance, Labs, Weight trends, I & O's  REASON FOR ASSESSMENT:   Low Braden    ASSESSMENT:   83 year old female with PMHx of COPD, PVD, DM type 2, HTN, HLD, CAD s/p CABG x 4 in 6378, chronic diastolic heart failure, CKD stage III, obstructing lung mass (initial thoracentesis negative for malignancy but too unwell for bronchoscopy) who presented with swelling and weeping of lower extremities found to have acute diastolic CHF with hyponatremia, enlarging right pleural effusion, AKI, thrush and difficulty swallowing.  -Diet downgraded to dysphagia 1 with thin liquids after SLP evaluation on 4/11.  -Patient found to have rapidly progressive stage IV lung cancer. Plan is for palliative radiation therapy to chest. -Plan was also for placement of Pleurx catheter tomorrow for pleural effusion, but patient and family have now decided to not have the procedure.  Spoke with patient and her family members at bedside. Patient reports her appetite remains very poor. She is enjoying Insurance account manager supplements. She was just finishing her second bottle of Premier Protein at time of visit and reports she tries to drink both  bottles daily. She eats every [employer] that comes on her tray. Had originally only ordered for BID, but they are coming on all three trays and patient wants to continue this. Patient does not eat much of the other items on her tray except for ice cream or pudding.   Meal Completion: 0-5% per chart Patient cannot recall how much of her dinner tray she had, but so far today has had approximately 1030 kcal (77% minimum estimated kcal needs) and 80 grams of protein (100% estimated protein needs).  Medications reviewed and include: ceftriaxone 1 gram Q24hrs, Colace, Lasix 60 mg BID, Novolog sliding scale TID with meals and daily at bedtime, levothyroxine, methylprednisolone 40 mg daily, vitamin B12 1000 micrograms daily.  Labs reviewed: CBG 78-377 past 24 hrs, Chloride 89, CO2 39, BUN 79, Creatinine 1.22.   Weight trend: 49.4 kg 4/16 (-5.8 kg from admission - likely related to fluid losses on Lasix)  I/O: 5 unmeasured occurrences of urine past 24 hrs  Diet Order:  DIET - DYS 1 Room service appropriate? Yes; Fluid consistency: Thin  Skin:  Wound (see comment) (DTI to coccyx)  Last BM:  03/29/2017  Height:   Ht Readings from Last 1 Encounters:  03/21/17 '5\' 2"'$  (1.575 m)    Weight:   Wt Readings from Last 1 Encounters:  03/31/17 109 lb (49.4 kg)    Ideal Body Weight:  50 kg  BMI:  Body mass index is 19.94 kg/m.  Estimated Nutritional Needs:   Kcal:  1335-1560 (30-35 kcal/kg)  Protein:  65-80 grams (1.5-1.8 grams/kg)  Fluid:  1.1 L/day (25 ml/kg)  EDUCATION NEEDS:  No education needs identified at this time  Willey Blade, MS, RD, LDN Pager: [phone] After Hours Pager: [phone]

## 2017-03-31 NOTE — Progress Notes (Signed)
Physical Therapy Treatment Patient Details Name: EULENE PEKAR MRN: [id number] DOB: 02/07/1925 Today's Date: 03/31/2017    History of Present Illness Pt is a 83 y.o. female admitted for acute on chronic cystolic CHF. Pt d/c from hospital about 1 week ago for sepsis secondary to pneumonia. Pt came to the ED from [facility] withh LE weeping, weakness, SOB, hypothermia, and hyponatremia. Pt's PMH includes lung CA, a-fib, CHF, CKD stage III, COPD, CAD, DM, HLD, HTN, PVD, spinal stenosis, s/p CABG x4, L hip fracture, and lumbar disc surgery.     PT Comments    Pt in bed.  Refused attempts at sitting EOB or mobility due to continued dizziness in supine.  Participated in exercises as described below.  Pt with weeping noted BUE's.  Will continue as appropriate.  Follow Up Recommendations  SNF     Equipment Recommendations  Rolling walker with 5" wheels    Recommendations for Other Services       Precautions / Restrictions Precautions Precautions: Fall Restrictions Weight Bearing Restrictions: No    Mobility  Bed Mobility               General bed mobility comments: refused due to dizziness  Transfers                 General transfer comment: Not assess d/t pt reporting severe dizziness lying in bed   Ambulation/Gait                 Stairs            Wheelchair Mobility    Modified Rankin (Stroke Patients Only)       Balance                                            Cognition Arousal/Alertness: Awake/alert Behavior During Therapy: WFL for tasks assessed/performed Overall Cognitive Status: Within Functional Limits for tasks assessed                                        Exercises Other Exercises Other Exercises: Supine exercises x 10 with self initiated rest breaks due to fatigue for ankle pumps, heel slides, slr, ab/ad, saq and pillow squeeze.    General Comments        Pertinent  Vitals/Pain Pain Assessment: No/denies pain    Home Living                      Prior Function            PT Goals (current goals can now be found in the care plan section)      Frequency    Min 2X/week      PT Plan      Co-evaluation             End of Session Equipment Utilized During Treatment: Oxygen Activity Tolerance: Patient limited by fatigue Patient left: in bed;with call bell/phone within reach;with bed alarm set;with family/visitor present         Time: 1215-1225 PT Time Calculation (min) (ACUTE ONLY): 10 min  Charges:  $Therapeutic Exercise: 8-22 mins                    G Codes:  Chesley Noon, PTA 03/31/17, 1:09 PM

## 2017-03-31 NOTE — Progress Notes (Signed)
Inpatient Diabetes Program Recommendations  AACE/ADA: New Consensus Statement on Inpatient Glycemic Control (2015)  Target Ranges:  Prepandial:   less than 140 mg/dL      Peak postprandial:   less than 180 mg/dL (1-2 hours)      Critically ill patients:  140 - 180 mg/dL   Results for Donna Lane, Donna Lane (MRN [id number]) as of 03/31/2017 11:59  Ref. Range 03/30/2017 07:39 03/30/2017 11:47 03/30/2017 16:59 03/30/2017 21:11  Glucose-Capillary Latest Ref Range: 65 - 99 mg/dL 341 (H) 348 (H) 377 (H) 214 (H)   Results for Donna Lane, Donna Lane (MRN [id number]) as of 03/31/2017 11:59  Ref. Range 03/31/2017 08:05 03/31/2017 11:51  Glucose-Capillary Latest Ref Range: 65 - 99 mg/dL 78 305 (H)    Home DM Meds: Levemir 13 units QHS       Novolog 0-10 units TID per SSI  Current Insulin Orders: Levemir 10 units daily      Novolog Moderate Correction Scale/ SSI (0-15 units) TID AC + HS        MD- Note Levemir 10 units daily started yesterday at 2pm.  Patient also getting Solumedrol 40 mg daily.  CBG this AM down to 78 mg/dl but post-prandial CBGs still quite elevated.  PO Intake quite poor as well.  Please consider the following if within goals of care:  1. Reduce Levemir slightly to 8 units daily  2. Change Novolog SSI coverage to Q4 hour coverage (currently ordered as TID AC + HS and patient has poor PO intake)     --Will follow patient during hospitalization--  Wyn Quaker RN, MSN, CDE Diabetes Coordinator Inpatient Glycemic Control Team Team Pager: [phone] (8a-5p)

## 2017-03-31 NOTE — Consult Note (Signed)
NEW PATIENT EVALUATION  Name: Donna Lane  MRN: [id number]  Date:   03/21/2017     DOB: [birth year]   This 83 y.o. female patient  presents to the clinic for initial evaluation of right-sided lung cancer.  REFERRING PHYSICIAN: No ref. provider found  CHIEF COMPLAINT:  Chief Complaint  Patient presents with  . Weakness  . Dizziness  . Leg Swelling    DIAGNOSIS: The primary encounter diagnosis was Generalized weakness. Diagnoses of Peripheral edema, Hyponatremia, Elevated troponin, Renal insufficiency, Cough, Pleural effusion on right, S/P thoracentesis, Pleural effusion, Lung cancer ([place]), Pleural effusion, not elsewhere classified, and Dizzy were also pertinent to this visit.   PREVIOUS INVESTIGATIONS:  CT scans reviewed No pathology reports available Clinical notes reviewed  HPI: Patient is a 83 year old female who has a known history of right-sided lung mass going back multiple months was declined any treatment or intervention or biopsy. She recently was presented to the hospital with shortness of breath and rapid atrial fibrillation. She was noted to have a large right-sided pleural effusion secondary to a extremely large rapidly growing growing right parahilar mass with probable mediastinal and cardiac involvement. She is undergone to thoracentesis. Both cytologies of those were negative for malignancy. She is recently admitted for above-stated problems. Most recent CT scan has shown the right middle lobe mass has increased over 7 times in volume over the past 4 months now invading mediastinum and possible right atrium. There is also bilateral pulmonary nodules compatible stage IV lung cancer. The tumor is obstructing the right middle lobe bronchus and right lower lobe bronchus. I've asked to evaluate patient for possible palliative radiation therapy. Recognition for Pleurx catheter has been made by thoracic surgery.  PLANNED TREATMENT REGIMEN: Palliative right chest  radiation  PAST MEDICAL HISTORY:  has a past medical history of Arrhythmia; Chronic diastolic heart failure (HCC); CKD (chronic kidney disease), stage III; COPD (chronic obstructive pulmonary disease) ([place]); Coronary artery disease (2008); Diabetes mellitus; Diverticulosis; Hyperlipidemia; Hypertension; Melanoma ([place]); PVD (peripheral vascular disease) ([place]); and Spinal stenosis.    PAST SURGICAL HISTORY:  Past Surgical History:  Procedure Laterality Date  . CARDIAC CATHETERIZATION     PTCA x 3  . CARDIOVERSION N/A 03/07/2017   Procedure: Cardioversion;  Surgeon: Minna Merritts, MD;  Location: [facility] ORS;  Service: Cardiovascular;  Laterality: N/A;  . CATARACT EXTRACTION    . CORONARY ARTERY BYPASS GRAFT  2008   x 4  . HIP FRACTURE SURGERY Left    pins placed w/o hip replacement, hardware removed later  . LUMBAR DISC SURGERY     in her 7s  . tonsillectomy    . VAGINAL DELIVERY     x2    FAMILY HISTORY: family history includes Diabetes in her sister; Heart disease in her father and sister.  SOCIAL HISTORY:  reports that she quit smoking about 10 years ago. Her smoking use included Cigarettes. She has a 50.00 pack-year smoking history. She has never used smokeless tobacco. She reports that she does not drink alcohol or use drugs.  ALLERGIES: Nsaids  MEDICATIONS:  Current Facility-Administered Medications  Medication Dose Route Frequency Provider Last Rate Last Dose  . acetaminophen (TYLENOL) tablet 650 mg  650 mg Oral Q6H PRN Harrie Foreman, MD   650 mg at 03/30/17 2128   Or  . acetaminophen (TYLENOL) suppository 650 mg  650 mg Rectal Q6H PRN Harrie Foreman, MD      . amiodarone (PACERONE) tablet 200 mg  200 mg Oral  BID Harrie Foreman, MD   200 mg at 03/31/17 1111  . antiseptic oral rinse (BIOTENE) solution 15 mL  15 mL Mouth Rinse PRN Loletha Grayer, MD      . atorvastatin (LIPITOR) tablet 10 mg  10 mg Oral q1800 Harrie Foreman, MD   10 mg at 03/30/17 1742  .  budesonide (PULMICORT) nebulizer solution 0.5 mg  0.5 mg Nebulization BID Loletha Grayer, MD   0.5 mg at 03/31/17 0740  . cefTRIAXone (ROCEPHIN) 1 g in dextrose 5 % 50 mL IVPB  1 g Intravenous Q24H Loletha Grayer, MD   1 g at 03/31/17 1111  . diphenhydrAMINE (BENADRYL) capsule 25 mg  25 mg Oral QHS PRN Harrie Foreman, MD      . docusate sodium (COLACE) capsule 100 mg  100 mg Oral BID Harrie Foreman, MD   100 mg at 03/31/17 1111  . fluconazole (DIFLUCAN) IVPB 100 mg  100 mg Intravenous Q24H Loletha Grayer, MD   100 mg at 03/31/17 1421  . furosemide (LASIX) injection 60 mg  60 mg Intravenous BID Loletha Grayer, MD   60 mg at 03/31/17 0824  . insulin aspart (novoLOG) injection 0-15 Units  0-15 Units Subcutaneous TID WC Epifanio Lesches, MD   11 Units at 03/31/17 1235  . insulin aspart (novoLOG) injection 0-5 Units  0-5 Units Subcutaneous QHS Epifanio Lesches, MD   2 Units at 03/30/17 2129  . insulin detemir (LEVEMIR) injection 10 Units  10 Units Subcutaneous Daily Epifanio Lesches, MD   10 Units at 03/31/17 1111  . ipratropium-albuterol (DUONEB) 0.5-2.5 (3) MG/3ML nebulizer solution 3 mL  3 mL Nebulization BID Epifanio Lesches, MD   3 mL at 03/31/17 0740  . levothyroxine (SYNTHROID, LEVOTHROID) tablet 50 mcg  50 mcg Oral QAC breakfast Harrie Foreman, MD   50 mcg at 03/31/17 0507  . MEDLINE mouth rinse  15 mL Mouth Rinse BID Loletha Grayer, MD   15 mL at 03/29/17 2203  . menthol-cetylpyridinium (CEPACOL) lozenge 3 mg  1 lozenge Oral PRN Harrie Foreman, MD      . methylPREDNISolone sodium succinate (SOLU-MEDROL) 40 mg/mL injection 40 mg  40 mg Intravenous Daily Loletha Grayer, MD   40 mg at 03/31/17 1110  . nitroGLYCERIN (NITROSTAT) SL tablet 0.4 mg  0.4 mg Sublingual Q5 min PRN Harrie Foreman, MD      . ondansetron [facility]) tablet 4 mg  4 mg Oral Q6H PRN Harrie Foreman, MD       Or  . ondansetron [facility]) injection 4 mg  4 mg Intravenous Q6H PRN Harrie Foreman, MD       . oxyCODONE (Oxy IR/ROXICODONE) immediate release tablet 2.5 mg  2.5 mg Oral Q4H PRN Harrie Foreman, MD   2.5 mg at 03/22/17 2229  . protein supplement (PREMIER PROTEIN) liquid  11 oz Oral BID BM Epifanio Lesches, MD   11 oz at 03/30/17 1400  . traZODone (DESYREL) tablet 25 mg  25 mg Oral QHS Harrie Foreman, MD   25 mg at 03/30/17 2128  . vitamin B-12 (CYANOCOBALAMIN) tablet 1,000 mcg  1,000 mcg Oral Daily Harrie Foreman, MD   1,000 mcg at 03/31/17 1111  . Zinc Oxide (TRIPLE PASTE) 12.8 % ointment   Topical PRN Pershing Proud, NP        ECOG PERFORMANCE STATUS:  3 - Symptomatic, >50% confined to bed  REVIEW OF SYSTEMS: Patient is a poor historian most of the review  of systems obtained from family.  Patient denies any weight loss, fatigue, weakness, fever, chills or night sweats. Patient denies any loss of vision, blurred vision. Patient denies any ringing  of the ears or hearing loss. No irregular heartbeat. Patient denies heart murmur or history of fainting. Patient denies any chest pain or pain radiating to her upper extremities. Patient denies any shortness of breath, difficulty breathing at night, cough or hemoptysis. Patient denies any swelling in the lower legs. Patient denies any nausea vomiting, vomiting of blood, or coffee ground material in the vomitus. Patient denies any stomach pain. Patient states has had normal bowel movements no significant constipation or diarrhea. Patient denies any dysuria, hematuria or significant nocturia. Patient denies any problems walking, swelling in the joints or loss of balance. Patient denies any skin changes, loss of hair or loss of weight. Patient denies any excessive worrying or anxiety or significant depression. Patient denies any problems with insomnia. Patient denies excessive thirst, polyuria, polydipsia. Patient denies any swollen glands, patient denies easy bruising or easy bleeding. Patient denies any recent infections, allergies or  URI. Patient "s visual fields have not changed significantly in recent time.    PHYSICAL EXAM: BP (!) 119/45 (BP Location: Left Arm)   Pulse 93   Temp 98.1 F (36.7 C) (Oral)   Resp (!) 26   Ht '5\' 2"'$  (1.575 m)   Wt 109 lb (49.4 kg)   SpO2 99%   BMI 19.94 kg/m  Elderly frail female seen in her hospital bed. She has significant edema in both her upper and lower extremities. She has decreased breast Route sounds in the right chest. Well-developed well-nourished patient in NAD. HEENT reveals PERLA, EOMI, discs not visualized.  Oral cavity is clear. No oral mucosal lesions are identified. Neck is clear without evidence of cervical or supraclavicular adenopathy. Lungs are clear to A&P. Cardiac examination is essentially unremarkable with regular rate and rhythm without murmur rub or thrill. Abdomen is benign with no organomegaly or masses noted. Motor sensory and DTR levels are equal and symmetric in the upper and lower extremities. Cranial nerves II through XII are grossly intact. Proprioception is intact. No peripheral adenopathy or edema is identified. No motor or sensory levels are noted. Crude visual fields are within normal range.  LABORATORY DATA: No pathology reports for review    RADIOLOGY RESULTS: CT scans reviewed and compatible with the above-stated findings   IMPRESSION: Rapidly progressive right lung cancer in 83 year old female refusing any intervention and biopsy  PLAN: At this time were dealing with stage IV lung cancer. I proposed a short palliative course of radiation therapy to her chest. Would plan on delivering 3000 cGy in 10 fractions. I may just my dose to a lower dose per fraction depending on the overall volume of the tumor at the time of simulation. Risks and benefits of treatment including possible radiation esophagitis, fatigue alteration of blood counts skin reaction all were discussed in detail with the patient and her family. They've also asked me about placing the  Pleurx catheter and I have left that up to them to discuss with the thoracic surgeon although it certainly will be beneficial in preventing recurrent pleural effusion. I personally set up and ordered CT simulation for later this week.  I would like to take this opportunity to thank you for allowing me to participate in the care of your patient.Armstead Peaks., MD

## 2017-03-31 NOTE — Progress Notes (Signed)
Patient ID: Donna Lane, female   DOB: [birth year], 83 y.o.   MRN: [id number]   [place] PROGRESS NOTE  Donna Lane [name] DOB: 09-02-1925 DOA: 03/21/2017 PCP: Donna Stain, MD  HPI/Subjective:  Seen at the bedside, having cough, shortness of breath. Going for palliative  radiation therapy starting from April 18th.. Seen by Donna Lane for Pleurx catheter, patient and family is aware of  Benefits..son and pt concerned about infection , and explained to him in detail about the benefits for more than the risks. and patient is tentatively scheduled to have pleurex cath  tomorrow.   Objective: Vitals:   03/30/17 2139 03/31/17 0515  BP: (!) 110/55 (!) 124/46  Pulse: 96 93  Resp: 16 18  Temp: 97.5 F (36.4 C) 97.5 F (36.4 C)    Filed Weights   03/27/17 0411 03/28/17 0500 03/31/17 0500  Weight: 49 kg (108 lb 1.6 oz) 49.3 kg (108 lb 11.2 oz) 49.4 kg (109 lb)    ROS: Review of Systems  Constitutional: Negative for chills and fever.  Eyes: Negative for blurred vision.  Respiratory: Positive for cough and shortness of breath.   Cardiovascular: Negative for chest pain.  Gastrointestinal: Positive for nausea. Negative for abdominal pain, constipation, diarrhea and vomiting.  Genitourinary: Negative for dysuria.  Musculoskeletal: Negative for joint pain.  Neurological: Positive for weakness. Negative for dizziness and headaches.   Exam: Physical Exam  Constitutional: She is oriented to person, place, and time.  HENT:  Nose: No mucosal edema.  Mouth/Throat: No oropharyngeal exudate or posterior oropharyngeal edema.  Eyes: Conjunctivae, EOM and lids are normal. Pupils are equal, round, and reactive to light.  Neck: No JVD present. Carotid bruit is not present. No edema present. No thyroid mass and no thyromegaly present.  Cardiovascular: S1 normal and S2 normal.  Exam reveals no gallop.   No murmur heard. Pulses:      Dorsalis pedis pulses are 2+ on the right  side, and 2+ on the left side.  Respiratory: No respiratory distress. She has decreased breath sounds in the right middle field, the right lower field, the left middle field and the left lower field. She has no wheezes. She has rhonchi in the right middle field and the left middle field. She has rales in the right lower field.  GI: Soft. Bowel sounds are normal. There is no tenderness.  Musculoskeletal:       Right wrist: She exhibits swelling.       Right ankle: She exhibits swelling.       Left ankle: She exhibits swelling.  Lymphadenopathy:    She has no cervical adenopathy.  Neurological: She is alert and oriented to person, place, and time. No cranial nerve deficit.  Skin: Skin is warm. No rash noted. Nails show no clubbing.  Psychiatric: She has a normal mood and affect.      Data Reviewed: Basic Metabolic Panel:  Recent Labs Lab 03/25/17 0539 03/27/17 0605 03/28/17 1204 03/31/17 0303  NA 129*  --  129* 137  K 3.7  --  3.3* 4.3  CL 87*  --  84* 89*  CO2 31  --  38* 39*  GLUCOSE 217*  --  154* 69  BUN 47*  --  53* 79*  CREATININE 1.46* 1.38* 1.16* 1.22*  CALCIUM 8.3*  --  8.2* 8.6*   CBC:  Recent Labs Lab 03/25/17 0539 03/27/17 0605 03/31/17 0303  WBC 22.8* 24.0* 27.0*  HGB 11.2* 11.6* 12.0  HCT 34.2*  34.9* 36.7  MCV 83.7 84.9 84.6  PLT 235 185 186   Cardiac Enzymes: No results for input(s): CKTOTAL, CKMB, CKMBINDEX, TROPONINI in the last 168 hours. BNP (last 3 results)  Recent Labs  01/06/17 1044 03/07/17 2238 03/21/17 0209  BNP 362.0* 182.0* 226.0*      Studies: Ct Head Wo Contrast  Result Date: 03/30/2017 CLINICAL DATA:  83 year old female with dizziness. Metastatic lung cancer. EXAM: CT HEAD WITHOUT CONTRAST TECHNIQUE: Contiguous axial images were obtained from the base of the skull through the vertex without intravenous contrast. COMPARISON:  Head CT without contrast 12/18/2005. FINDINGS: Brain: Cerebral volume has decreased since 2007 but  remains normal for age. No midline shift, ventriculomegaly, mass effect, evidence of mass lesion, intracranial hemorrhage or evidence of cortically based acute infarction. Gray-white matter differentiation is within normal limits throughout the brain. No contrast administered. Vascular: Calcified atherosclerosis at the skull base. No suspicious intracranial vascular hyperdensity. Skull: Motion artifact at the skullbase. No acute or suspicious osseous lesion identified. Sinuses/Orbits: Visualized paranasal sinuses and mastoids are stable and well pneumatized. Other: Negative orbit and scalp soft tissues. IMPRESSION: Negative for age noncontrast CT appearance of the brain. Note that early metastatic disease to the brain cannot be excluded in the absence of intravenous contrast. Electronically Signed   By: Donna Lane M.D.   On: 03/30/2017 19:20    Scheduled Meds: . amiodarone  200 mg Oral BID  . atorvastatin  10 mg Oral q1800  . budesonide (PULMICORT) nebulizer solution  0.5 mg Nebulization BID  . cefTRIAXone (ROCEPHIN)  IV  1 g Intravenous Q24H  . docusate sodium  100 mg Oral BID  . fluconazole (DIFLUCAN) IV  100 mg Intravenous Q24H  . furosemide  60 mg Intravenous BID  . insulin aspart  0-15 Units Subcutaneous TID WC  . insulin aspart  0-5 Units Subcutaneous QHS  . insulin detemir  10 Units Subcutaneous Daily  . ipratropium-albuterol  3 mL Nebulization BID  . levothyroxine  50 mcg Oral QAC breakfast  . mouth rinse  15 mL Mouth Rinse BID  . methylPREDNISolone (SOLU-MEDROL) injection  40 mg Intravenous Daily  . protein supplement shake  11 oz Oral BID BM  . traZODone  25 mg Oral QHS  . cyanocobalamin  1,000 mcg Oral Daily    Assessment/Plan:  1. Stage 4 lung  Cancer;tumor  7 times in 4 months, now invading mediastinum,  Right atrium,patient has right  and left pleural effusion. D/w patient and family. 2. Stage 4 lung cancer; recent radiation oncology Donna Lane, scheduled to start palliative  radiation therapy from 18 to April. Seen by Donna Lane for Pleurx catheter placement.for pleurx cath. ptient for tomorrow but patient did not decide that yet.explained to her and son Donna Lane about it multiple times.he stiill worried about infection with plerx cath.told him at this time  Treating her symptoms should be priority.we can have RN at home for home health if she is able to go home ? Hold lovenox. 3. Acute kidney injury on chronic kidney disease stage III. Monitor with diuresis 4. Hyperkalemia. Improved after Kayexalate given 5. Atrial fibrillation. Rate controlled with amiodarone  History of hyponatremia. Likely SIADH. 6. Type 2 diabetes mellitus on sliding scale ,po take is really poor because of odynophagia.  7. Hyperlipidemia on statin 8. Hypothermia, possible pneumonia. On Rocephin and Zithromax. Temperature came back up. Continue empiric antibiotics. 9. Poor air entry start nebulizer treatments with budesonide and Solu-Medrol 10. Thrush and difficulty swallowing. IV Diflucan.  Speech therapy downgraded diet 11. Elevated troponin demand ischemia from CHF Prognosis  poor, family does not want hospice home placement, will decide about home with hospice   12. palliative care consultation : CODE STATUS DO NOT RESUSCITATE. Poor prognosis  Ideally hospice candidate but pt and family refuses  Hospice placement.will get palliative care consult. D/whusband   Code Status:     Code Status Orders        Start     Ordered   03/21/17 0732  Do not attempt resuscitation (DNR)  Continuous    Question Answer Comment  In the event of cardiac or respiratory ARREST Do not call a "code blue"   In the event of cardiac or respiratory ARREST Do not perform Intubation, CPR, defibrillation or ACLS   In the event of cardiac or respiratory ARREST Use medication by any route, position, wound care, and other measures to relive pain and suffering. May use oxygen, suction and manual treatment of  airway obstruction as needed for comfort.      03/21/17 0731    Code Status History    Date Active Date Inactive Code Status Order ID Comments User Context   03/13/2017  9:43 AM 03/14/2017 12:09 AM DNR [id number]  Melton Alar, PA-C Inpatient   03/08/2017  2:15 AM 03/13/2017  9:43 AM Partial Code [id number]  Harrie Foreman, MD ED   01/07/2017  7:26 PM 01/09/2017  5:42 PM Partial Code [id number]  Vaughan Basta, MD Inpatient   01/06/2017  3:21 PM 01/07/2017  7:26 PM Full Code [id number]  Epifanio Lesches, MD ED   11/28/2016  7:40 PM 11/29/2016  3:44 PM Full Code [id number]  Vaughan Basta, MD Inpatient    Advance Directive Documentation     Most Recent Value  Type of Advance Directive  Healthcare Power of Attorney  Pre-existing out of facility DNR order (yellow form or pink MOST form)  -  "MOST" Form in Place?  -     Family Communication: Daughter, Husband and son-in-law at the bedside  Disposition Plan: To be determined  Antibiotics:  Rocephin  Zithromax  Time spent: 38 minutes, including ACP time  [employer]

## 2017-03-31 NOTE — Progress Notes (Signed)
Daily Progress Note   Patient Name: Donna Lane       Date: 03/31/2017 DOB: 01-04-1925  Age: 83 y.o. MRN#: [id number] Attending Physician: Epifanio Lesches, MD Primary Care Physician: [name], MD Admit Date: 03/21/2017  Reason for Consultation/Follow-up: Establishing goals of care  Subjective: Donna Lane is lying in bed. Feeling "a little better I guess." Son and daughter-in-law at bedside.   Length of Stay: 10  Current Medications: Scheduled Meds:  . amiodarone  200 mg Oral BID  . atorvastatin  10 mg Oral q1800  . budesonide (PULMICORT) nebulizer solution  0.5 mg Nebulization BID  . cefTRIAXone (ROCEPHIN)  IV  1 g Intravenous Q24H  . docusate sodium  100 mg Oral BID  . fluconazole (DIFLUCAN) IV  100 mg Intravenous Q24H  . furosemide  60 mg Intravenous BID  . insulin aspart  0-15 Units Subcutaneous TID WC  . insulin aspart  0-5 Units Subcutaneous QHS  . insulin detemir  10 Units Subcutaneous Daily  . ipratropium-albuterol  3 mL Nebulization BID  . levothyroxine  50 mcg Oral QAC breakfast  . mouth rinse  15 mL Mouth Rinse BID  . methylPREDNISolone (SOLU-MEDROL) injection  40 mg Intravenous Daily  . protein supplement shake  11 oz Oral BID BM  . traZODone  25 mg Oral QHS  . cyanocobalamin  1,000 mcg Oral Daily    Continuous Infusions:   PRN Meds: acetaminophen **OR** acetaminophen, antiseptic oral rinse, diphenhydrAMINE, menthol-cetylpyridinium, nitroGLYCERIN, ondansetron **OR** ondansetron (ZOFRAN) IV, oxyCODONE, Zinc Oxide  Physical Exam  Constitutional: She appears well-developed. She appears cachectic. She appears ill.  HENT:  Head: Normocephalic and atraumatic.  Cardiovascular: Normal rate.   Pulmonary/Chest: No accessory muscle usage. No tachypnea.    Mild-mod distress with activity  Abdominal: Normal appearance.  Neurological: She is alert.  Mostly oriented. Some memory impairment.   Nursing note and vitals reviewed.           Vital Signs: BP (!) 119/45 (BP Location: Left Arm)   Pulse 93   Temp 98.1 F (36.7 C) (Oral)   Resp (!) 26   Ht '5\' 2"'$  (1.575 m)   Wt 49.4 kg (109 lb)   SpO2 99%   BMI 19.94 kg/m  SpO2: SpO2: 99 % O2 Device: O2 Device: Nasal Cannula O2 Flow Rate: O2 Flow  Rate (L/min): 2 L/min  Intake/output summary:   Intake/Output Summary (Last 24 hours) at 03/31/17 1547 Last data filed at 03/31/17 1610  Gross per 24 hour  Intake              118 ml  Output                0 ml  Net              118 ml   LBM: Last BM Date: 03/29/17 Baseline Weight: Weight: 63.5 kg (140 lb) Most recent weight: Weight: 49.4 kg (109 lb)       Palliative Assessment/Data:    Flowsheet Rows     Most Recent Value  Intake Tab  Referral Department  Hospitalist  Unit at Time of Referral  Med/Surg Unit  Palliative Care Primary Diagnosis  Cancer  Date Notified  03/23/17  Palliative Care Type  Return patient Palliative Care  Reason for referral  Clarify Goals of Care  Date of Admission  03/21/17  # of days IP prior to Palliative referral  2  Clinical Assessment  Psychosocial & Spiritual Assessment  Palliative Care Outcomes      Patient Active Problem List   Diagnosis Date Noted  . Goals of care, counseling/discussion   . Acute on chronic systolic CHF (congestive heart failure) ([place]) 03/21/2017  . Pressure injury of skin 03/21/2017  . DNR (do not resuscitate)   . Shortness of breath   . Pleural effusion on right   . Healthcare-associated pneumonia   . Palliative care encounter   . Primary insomnia   . Sepsis ([place]) 03/08/2017  . Edema 02/23/2017  . Chronic diastolic heart failure ([place]) 01/19/2017  . Atrial fibrillation with rapid ventricular response ([place]) 01/06/2017  . Acute respiratory distress 01/06/2017  . COPD  (chronic obstructive pulmonary disease) ([place]) 01/06/2017  . Hypokalemia 01/06/2017  . Elevated troponin 11/29/2016  . Acute renal insufficiency 11/29/2016  . Hyponatremia 11/29/2016  . Mass of middle lobe of right lung 11/29/2016  . Atelectasis of right lung 11/29/2016  . Left eye complaint 07/14/2016  . Caregiver stress 07/14/2016  . Olecranon bursitis of right elbow 06/19/2016  . Myalgia and myositis 05/20/2016  . Chronic kidney disease, stage 3 01/09/2016  . Advance care planning 01/05/2016  . Spinal stenosis of lumbar region 01/05/2016  . Diabetes mellitus type 2, uncontrolled, with complications ([place]) 96/03/5408  . Hyperlipidemia 03/14/2010  . Essential hypertension 03/14/2010  . CORONARY ATHEROSLERO AUTOL VEIN BYPASS GRAFT 03/14/2010  . Peripheral arterial disease ([place]) 03/14/2010  . Chest pain 03/14/2010    Palliative Care Assessment & Plan   HPI: 83 yo female with CAD, diastolic CHF, PVD, CKD III, DMII, diverticulosis, HTN, HLD, melanoma, spinal stenosis and pleural effusions (previous cytology benign) with lung mass obstructing right bronchus.  CT chest 03/28/17 shows aggressively enlarging mass 7 fold larger with concern of invasion of mediastinum and right atrium along with new scattered bilat pulm nodules.   Assessment: Family at bedside have had multiple discussions today. They have decided to pursue radiation therapy and to wait on PleurX drain for now. Donna Lane is not very keen on having drain placed. Family at bedside seem more understanding of her situation after the conversations today. I emphasized the importance of QOL for Donna Lane. She is in better spirits with family around her. I did not bring up hospice today as she has had VERY strong objections in the past and per her daughter has made them  promise to NEVER put her in hospice. I will continue to follow and support throughout her hospital stay. Donna Lane does strongly value QOL and not very interested  in aggressive interventions per palliative conversations.   Recommendations/Plan:  Poor intake: Treat thrush. Continue Premier Protein per dietician recommendations - seems to be accepting protein shakes better.   Donna Lane does not desire invasive and aggressive measures.   Goals of Care and Additional Recommendations:  Limitations on Scope of Treatment: No Tracheostomy  Code Status:  DNR  Prognosis:   Weeks to months.   Discharge Planning:  To Be Determined. Likely no other options but SNF with palliative follow up.    Thank you for allowing the Palliative Medicine Team to assist in the care of this patient.   Total Time 51mn Prolonged Time Billed  no       Greater than 50%  of this time was spent counseling and coordinating care related to the above assessment and plan.  AVinie Sill NP Palliative Medicine Team Pager # [phone](M-F 8a-5p) Team Phone # [phone](Nights/Weekends)

## 2017-03-31 NOTE — Progress Notes (Signed)
Spoke with MD about pt and family's request to cancel procedure tomorrow.

## 2017-03-31 NOTE — Progress Notes (Signed)
Patient ID: Donna Lane, female   DOB: March 03, 1925, 83 y.o.   MRN: [id number]  Chief Complaint  Patient presents with  . Weakness  . Dizziness  . Leg Swelling    Referred By Dr. Vianne Bulls  Reason for Referral recurrent right pleural effusion  HPI Location, Quality, Duration, Severity, Timing, Context, Modifying Factors, Associated Signs and Symptoms.  Donna Lane is a 83 y.o. female.  I have been asked to see this patient by Dr. Sissy Hoff. I discussed her care with Dr. Sissy Hoff at length.  She is an 83 year old woman who had a known history of a right sided lung mass several months ago. The patient declined any intervention and was admitted to the hospital with shortness of breath and rapid atrial fibrillation. The patient was cardioverted and a workup ensued. The patient was found to have a large right-sided pleural effusion presumed secondary to a large right perihilar mass with probable mediastinal and cardiac involvement. There has been 2 thoracenteses performed. The patient and her family who are at her bedside state that she did have subjective improvement with these. To date the results have been negative for malignancy. Despite her advanced age the family states that she was in reasonably good health until a few months ago. She was able to do most of the activities of daily living until the last 2-3 months. This has been secondary to her shortness of breath. Today she states that she feels unwell overall and has pain in her upper and lower extremities as well as in her back and chest. She also complains of shortness of breath. She's had a cough for a couple weeks. Her cough is mostly nonproductive.   Past Medical History:  Diagnosis Date  . Arrhythmia    atrial fibrillation  . Chronic diastolic heart failure ([place])   . CKD (chronic kidney disease), stage III   . COPD (chronic obstructive pulmonary disease) ([place])   . Coronary artery disease 2008   CABG x 4,   . Diabetes  mellitus   . Diverticulosis    on colonoscopy 08/12/2006  . Hyperlipidemia   . Hypertension   . Melanoma ([place])    removed by derm   . PVD (peripheral vascular disease) ([place])   . Spinal stenosis     Past Surgical History:  Procedure Laterality Date  . CARDIAC CATHETERIZATION     PTCA x 3  . CARDIOVERSION N/A 03/07/2017   Procedure: Cardioversion;  Surgeon: Minna Merritts, MD;  Location: [facility] ORS;  Service: Cardiovascular;  Laterality: N/A;  . CATARACT EXTRACTION    . CORONARY ARTERY BYPASS GRAFT  2008   x 4  . HIP FRACTURE SURGERY Left    pins placed w/o hip replacement, hardware removed later  . LUMBAR DISC SURGERY     in her 65s  . tonsillectomy    . VAGINAL DELIVERY     x2    Family History  Problem Relation Age of Onset  . Heart disease Father   . Heart disease Sister   . Diabetes Sister     Social History Social History  Substance Use Topics  . Smoking status: Former Smoker    Packs/day: 1.00    Years: 50.00    Types: Cigarettes    Quit date: 03/20/2007  . Smokeless tobacco: Never Used  . Alcohol use No    Allergies  Allergen Reactions  . Nsaids Other (See Comments)    Would avoid due to CKD.... Pt states she not allergic  Current Facility-Administered Medications  Medication Dose Route Frequency Provider Last Rate Last Dose  . acetaminophen (TYLENOL) tablet 650 mg  650 mg Oral Q6H PRN Harrie Foreman, MD   650 mg at 03/30/17 2128   Or  . acetaminophen (TYLENOL) suppository 650 mg  650 mg Rectal Q6H PRN Harrie Foreman, MD      . amiodarone (PACERONE) tablet 200 mg  200 mg Oral BID Harrie Foreman, MD   200 mg at 03/31/17 1111  . antiseptic oral rinse (BIOTENE) solution 15 mL  15 mL Mouth Rinse PRN Loletha Grayer, MD      . atorvastatin (LIPITOR) tablet 10 mg  10 mg Oral q1800 Harrie Foreman, MD   10 mg at 03/30/17 1742  . budesonide (PULMICORT) nebulizer solution 0.5 mg  0.5 mg Nebulization BID Loletha Grayer, MD   0.5 mg at 03/31/17 0740   . cefTRIAXone (ROCEPHIN) 1 g in dextrose 5 % 50 mL IVPB  1 g Intravenous Q24H Loletha Grayer, MD   1 g at 03/31/17 1111  . diphenhydrAMINE (BENADRYL) capsule 25 mg  25 mg Oral QHS PRN Harrie Foreman, MD      . docusate sodium (COLACE) capsule 100 mg  100 mg Oral BID Harrie Foreman, MD   100 mg at 03/31/17 1111  . fluconazole (DIFLUCAN) IVPB 100 mg  100 mg Intravenous Q24H Loletha Grayer, MD   100 mg at 03/30/17 1420  . furosemide (LASIX) injection 60 mg  60 mg Intravenous BID Loletha Grayer, MD   60 mg at 03/31/17 0824  . insulin aspart (novoLOG) injection 0-15 Units  0-15 Units Subcutaneous TID WC Epifanio Lesches, MD   11 Units at 03/31/17 1235  . insulin aspart (novoLOG) injection 0-5 Units  0-5 Units Subcutaneous QHS Epifanio Lesches, MD   2 Units at 03/30/17 2129  . insulin detemir (LEVEMIR) injection 10 Units  10 Units Subcutaneous Daily Epifanio Lesches, MD   10 Units at 03/31/17 1111  . ipratropium-albuterol (DUONEB) 0.5-2.5 (3) MG/3ML nebulizer solution 3 mL  3 mL Nebulization BID Epifanio Lesches, MD   3 mL at 03/31/17 0740  . levothyroxine (SYNTHROID, LEVOTHROID) tablet 50 mcg  50 mcg Oral QAC breakfast Harrie Foreman, MD   50 mcg at 03/31/17 0507  . MEDLINE mouth rinse  15 mL Mouth Rinse BID Loletha Grayer, MD   15 mL at 03/29/17 2203  . menthol-cetylpyridinium (CEPACOL) lozenge 3 mg  1 lozenge Oral PRN Harrie Foreman, MD      . methylPREDNISolone sodium succinate (SOLU-MEDROL) 40 mg/mL injection 40 mg  40 mg Intravenous Daily Loletha Grayer, MD   40 mg at 03/31/17 1110  . nitroGLYCERIN (NITROSTAT) SL tablet 0.4 mg  0.4 mg Sublingual Q5 min PRN Harrie Foreman, MD      . ondansetron [facility]) tablet 4 mg  4 mg Oral Q6H PRN Harrie Foreman, MD       Or  . ondansetron [facility]) injection 4 mg  4 mg Intravenous Q6H PRN Harrie Foreman, MD      . oxyCODONE (Oxy IR/ROXICODONE) immediate release tablet 2.5 mg  2.5 mg Oral Q4H PRN Harrie Foreman, MD   2.5  mg at 03/22/17 2229  . protein supplement (PREMIER PROTEIN) liquid  11 oz Oral BID BM Epifanio Lesches, MD   11 oz at 03/30/17 1400  . traZODone (DESYREL) tablet 25 mg  25 mg Oral QHS Harrie Foreman, MD   25 mg at 03/30/17 2128  .  vitamin B-12 (CYANOCOBALAMIN) tablet 1,000 mcg  1,000 mcg Oral Daily Harrie Foreman, MD   1,000 mcg at 03/31/17 1111  . Zinc Oxide (TRIPLE PASTE) 12.8 % ointment   Topical PRN Pershing Proud, NP          Review of Systems A complete review of systems was asked and was negative except for the following positive findings cough, shortness of breath, failure to thrive.  Blood pressure (!) 124/46, pulse 93, temperature 97.5 F (36.4 C), temperature source Oral, resp. rate 18, height '5\' 2"'$  (1.575 m), weight 109 lb (49.4 kg), SpO2 99 %.  Physical Exam CONSTITUTIONAL:  Pleasant, well-developed, well-nourished, and in no acute distress. EYES: Pupils equal and reactive to light, Sclera non-icteric EARS, NOSE, MOUTH AND THROAT:  The oropharynx was clear.  Oral mucosa pink and moist. LYMPH NODES:  Lymph nodes in the neck and axillae were normal RESPIRATORY:  Lungs were diminished throughout but particularly at the right base..  Normal respiratory effort without pathologic use of accessory muscles of respiration CARDIOVASCULAR: Heart was regular without murmurs.  There were no carotid bruits. GI: The abdomen was soft, nontender, and nondistended. There were no palpable masses. There was no hepatosplenomegaly. There were normal bowel sounds in all quadrants. GU:  Rectal deferred.   MUSCULOSKELETAL:  Normal muscle strength and tone.  No clubbing or cyanosis.   SKIN:  There were no pathologic skin lesions.  There were no nodules on palpation.  There are ecchymotic changes bilaterally with significant upper extremity edema NEUROLOGIC:  Sensation is normal.  Cranial nerves are grossly intact. PSYCH:  Oriented to person, place and time.  Mood and affect are  normal.  Data Reviewed Chest x-rays and CT scans  I have personally reviewed the patient's imaging, laboratory findings and medical records.    Assessment    I had a long discussion today with the patient, her son and daughter. I reviewed with him the options. This would include the option of hospice care only with comfort measures. We also discussed the possibility of performing repeat thoracenteses as required. We also spent a considerable period time reviewing the indications and risks of right sided Pleurx catheter insertion. I reviewed with her the options in great detail. Risks of bleeding, infection, lung injury, catheter malfunction and death were all reviewed.    Plan    I spent a considerable period of time reviewing with the patient and her family the options. They asked questions about the procedure. I explained to them that Pleurx catheter insertion is a good long-term solution to her problem. I explained the operation in detail and I believe that they would like proceed. They will discuss this further with the patient. We'll go ahead and plan on performing this tomorrow.    Nestor Lewandowsky, MD 03/31/2017, 1:15 PM

## 2017-04-01 ENCOUNTER — Encounter
Admission: EM | Disposition: A | Discharge status: Hospice | Payer: Self-pay | Source: Home / Self Care | Attending: Internal Medicine

## 2017-04-01 ENCOUNTER — Ambulatory Visit: Payer: Medicare Other | Admitting: Cardiovascular Disease

## 2017-04-01 LAB — GLUCOSE, CAPILLARY
Glucose-Capillary: 150 mg/dL — ABNORMAL HIGH (ref 65–99)
Glucose-Capillary: 322 mg/dL — ABNORMAL HIGH (ref 65–99)
Glucose-Capillary: 238 mg/dL — ABNORMAL HIGH (ref 65–99)
Glucose-Capillary: 123 mg/dL — ABNORMAL HIGH (ref 65–99)

## 2017-04-01 SURGERY — CHEST TUBE INSERTION
Anesthesia: Monitor Anesthesia Care | Laterality: Right

## 2017-04-01 MED ORDER — FUROSEMIDE 10 MG/ML IJ SOLN
40.0000 mg | Freq: Two times a day (BID) | INTRAMUSCULAR | Status: DC
  Administered 2017-04-01 – 2017-04-02 (×3): 40 mg via INTRAVENOUS
  Filled 2017-04-01 (×5): qty 4

## 2017-04-01 NOTE — Progress Notes (Signed)
Inpatient Diabetes Program Recommendations  AACE/ADA: New Consensus Statement on Inpatient Glycemic Control (2015)  Target Ranges:  Prepandial:   less than 140 mg/dL      Peak postprandial:   less than 180 mg/dL (1-2 hours)      Critically ill patients:  140 - 180 mg/dL   Lab Results  Component Value Date   GLUCAP 322 (H) 04/01/2017   HGBA1C 7.5 (H) 01/07/2017    Review of Glycemic Control  Results for Donna Lane, Donna Lane (MRN [id number]) as of 04/01/2017 14:11  Ref. Range 03/31/2017 16:55 03/31/2017 19:36 03/31/2017 21:20 04/01/2017 08:01 04/01/2017 11:37  Glucose-Capillary Latest Ref Range: 65 - 99 mg/dL 387 (H) 282 (H) 240 (H) 150 (H) 322 (H)    Patient also getting Solumedrol 40 mg daily. Elevated CBG at noon because of a delay in insulin administration this morning and steroid use.   Please consider changing the Novolog SSI coverage to Q4 hour coverage (currently ordered as TID AC + HS and patient has poor PO intake)  Gentry Fitz, RN, [place], [place], CDE Diabetes Coordinator Inpatient Diabetes Program  [phone] (Team Pager) [phone] ([place]) 04/01/2017 2:12 PM

## 2017-04-01 NOTE — Progress Notes (Signed)
Patient ID: Donna Lane, female   DOB: 1925-04-25, 83 y.o.   MRN: [id number]   [facility] PROGRESS NOTE  Donna Lane Fabro [id number]:[id number] DOB: [birth year] DOA: 03/21/2017 PCP: [name], MD  HPI/Subjective: family  does not want  pleuralCatheter placement.  wanted to try palliative radiation therapy tomorrow. Patient does have cough, shortness of breath  Objective: Vitals:   03/31/17 1939 04/01/17 0559  BP: (!) 113/43 (!) 113/44  Pulse: 94 90  Resp: (!) 28 16  Temp: 98.4 F (36.9 C) 97.6 F (36.4 C)    Filed Weights   03/28/17 0500 03/31/17 0500 04/01/17 0500  Weight: 49.3 kg (108 lb 11.2 oz) 49.4 kg (109 lb) 44.9 kg (99 lb)    ROS: Review of Systems  Constitutional: Negative for chills and fever.  Eyes: Negative for blurred vision.  Respiratory: Positive for cough and shortness of breath.   Cardiovascular: Negative for chest pain.  Gastrointestinal: Positive for nausea. Negative for abdominal pain, constipation, diarrhea and vomiting.  Genitourinary: Negative for dysuria.  Musculoskeletal: Negative for joint pain.  Neurological: Positive for weakness. Negative for dizziness and headaches.   Exam: Physical Exam  Constitutional: She is oriented to person, place, and time.  HENT:  Nose: No mucosal edema.  Mouth/Throat: No oropharyngeal exudate or posterior oropharyngeal edema.  Eyes: Conjunctivae, EOM and lids are normal. Pupils are equal, round, and reactive to light.  Neck: No JVD present. Carotid bruit is not present. No edema present. No thyroid mass and no thyromegaly present.  Cardiovascular: S1 normal and S2 normal.  Exam reveals no gallop.   No murmur heard. Pulses:      Dorsalis pedis pulses are 2+ on the right side, and 2+ on the left side.  Respiratory: No respiratory distress. She has decreased breath sounds in the right middle field, the right lower field, the left middle field and the left lower field. She has no wheezes. She has rhonchi in the  right middle field and the left middle field. She has rales in the right lower field.  GI: Soft. Bowel sounds are normal. There is no tenderness.  Musculoskeletal:       Right wrist: She exhibits swelling.       Right ankle: She exhibits swelling.       Left ankle: She exhibits swelling.  Lymphadenopathy:    She has no cervical adenopathy.  Neurological: She is alert and oriented to person, place, and time. No cranial nerve deficit.  Skin: Skin is warm. No rash noted. Nails show no clubbing.  Psychiatric: She has a normal mood and affect.      Data Reviewed: Basic Metabolic Panel:  Recent Labs Lab 03/27/17 0605 03/28/17 1204 03/31/17 0303  NA  --  129* 137  K  --  3.3* 4.3  CL  --  84* 89*  CO2  --  38* 39*  GLUCOSE  --  154* 69  BUN  --  53* 79*  CREATININE 1.38* 1.16* 1.22*  CALCIUM  --  8.2* 8.6*   CBC:  Recent Labs Lab 03/27/17 0605 03/31/17 0303  WBC 24.0* 27.0*  HGB 11.6* 12.0  HCT 34.9* 36.7  MCV 84.9 84.6  PLT 185 186   Cardiac Enzymes: No results for input(s): CKTOTAL, CKMB, CKMBINDEX, TROPONINI in the last 168 hours. BNP (last 3 results)  Recent Labs  01/06/17 1044 03/07/17 2238 03/21/17 0209  BNP 362.0* 182.0* 226.0*      Studies: Dg Chest 2 View  Result Date: 03/31/2017  CLINICAL DATA:  Pleural effusion EXAM: CHEST  2 VIEW COMPARISON:  Chest CT 3 days ago FINDINGS: Bilateral pleural effusions, small on the left and large on the right. Right pleural fluid obscures a right anterior base mass and neighboring atelectasis. No pneumothorax. No pulmonary edema. Status post CABG. IMPRESSION: 1. Large right and small left pleural effusions. 2. Known malignancy at the right base. Electronically Signed   By: Monte Fantasia M.D.   On: 03/31/2017 14:20   Ct Head Wo Contrast  Result Date: 03/30/2017 CLINICAL DATA:  83 year old female with dizziness. Metastatic lung cancer. EXAM: CT HEAD WITHOUT CONTRAST TECHNIQUE: Contiguous axial images were obtained  from the base of the skull through the vertex without intravenous contrast. COMPARISON:  Head CT without contrast 12/18/2005. FINDINGS: Brain: Cerebral volume has decreased since 2007 but remains normal for age. No midline shift, ventriculomegaly, mass effect, evidence of mass lesion, intracranial hemorrhage or evidence of cortically based acute infarction. Gray-white matter differentiation is within normal limits throughout the brain. No contrast administered. Vascular: Calcified atherosclerosis at the skull base. No suspicious intracranial vascular hyperdensity. Skull: Motion artifact at the skullbase. No acute or suspicious osseous lesion identified. Sinuses/Orbits: Visualized paranasal sinuses and mastoids are stable and well pneumatized. Other: Negative orbit and scalp soft tissues. IMPRESSION: Negative for age noncontrast CT appearance of the brain. Note that early metastatic disease to the brain cannot be excluded in the absence of intravenous contrast. Electronically Signed   By: Genevie Ann M.D.   On: 03/30/2017 19:20    Scheduled Meds: . amiodarone  200 mg Oral BID  . atorvastatin  10 mg Oral q1800  . budesonide (PULMICORT) nebulizer solution  0.5 mg Nebulization BID  . docusate sodium  100 mg Oral BID  . fluconazole (DIFLUCAN) IV  100 mg Intravenous Q24H  . furosemide  60 mg Intravenous BID  . insulin aspart  0-15 Units Subcutaneous TID WC  . insulin aspart  0-5 Units Subcutaneous QHS  . insulin detemir  10 Units Subcutaneous Daily  . ipratropium-albuterol  3 mL Nebulization BID  . levothyroxine  50 mcg Oral QAC breakfast  . mouth rinse  15 mL Mouth Rinse BID  . methylPREDNISolone (SOLU-MEDROL) injection  40 mg Intravenous Daily  . protein supplement shake  11 oz Oral BID BM  . traZODone  25 mg Oral QHS  . cyanocobalamin  1,000 mcg Oral Daily    Assessment/Plan:  1. Stage 4 lung  Cancer;tumor  7 times in 4 months, now invading mediastinum,  Right atrium,patient has right  and left  pleural effusion. D/w patient and family. 2. Stage 4 lung cancer; .Palliative radiation therapy from tomorrow. Family not decided about Pleurx catheter placement. 3.  4. Acute kidney injury on chronic kidney disease stage III.stable  kidney function 5. Hyperkalemia. Improved after Kayexalate given 6. Atrial fibrillation. Rate controlled with amiodarone  History of hyponatremia. Likely SIADH. 7. Type 2 diabetes mellitus on sliding scale ,po take is really poor because of odynophagia.  8. Hyperlipidemia on statin 9. Hypothermia, possible pneumonia. On Rocephin and Zithromax. Temperature came back up. Continue empiric antibiotics. 10. Poor air entry start nebulizer treatments with budesonide and Solu-Medrol 11. Thrush and difficulty swallowing. IV Diflucan. Speech therapy downgraded diet; improving yet. Likely secondary to advanced cancer. Prognosis is really poor  12. palliative care consultation done;: CODE STATUS DO NOT RESUSCITATE. Poor prognosis  Ideally hospice candidate but pt and family refuses  Hospice placement.will get palliative care consult. D/whusband   Code Status:  Code Status Orders        Start     Ordered   03/21/17 0732  Do not attempt resuscitation (DNR)  Continuous    Question Answer Comment  In the event of cardiac or respiratory ARREST Do not call a "code blue"   In the event of cardiac or respiratory ARREST Do not perform Intubation, CPR, defibrillation or ACLS   In the event of cardiac or respiratory ARREST Use medication by any route, position, wound care, and other measures to relive pain and suffering. May use oxygen, suction and manual treatment of airway obstruction as needed for comfort.      03/21/17 0731    Code Status History    Date Active Date Inactive Code Status Order ID Comments User Context   03/13/2017  9:43 AM 03/14/2017 12:09 AM DNR [id number]  Melton Alar, PA-C Inpatient   03/08/2017  2:15 AM 03/13/2017  9:43 AM Partial Code [id number]   Harrie Foreman, MD ED   01/07/2017  7:26 PM 01/09/2017  5:42 PM Partial Code [id number]  Vaughan Basta, MD Inpatient   01/06/2017  3:21 PM 01/07/2017  7:26 PM Full Code [id number]  Epifanio Lesches, MD ED   11/28/2016  7:40 PM 11/29/2016  3:44 PM Full Code [id number]  Vaughan Basta, MD Inpatient    Advance Directive Documentation     Most Recent Value  Type of Advance Directive  Healthcare Power of Attorney  Pre-existing out of facility DNR order (yellow form or pink MOST form)  -  "MOST" Form in Place?  -     Family Communication: Daughter, Husband and son-in-law at the bedside  Disposition Plan: To be determined  Antibiotics:  Rocephin  Zithromax  Time spent: 38 minutes, including ACP time  [employer]

## 2017-04-02 ENCOUNTER — Telehealth: Payer: Self-pay | Admitting: General Practice

## 2017-04-02 ENCOUNTER — Ambulatory Visit: Payer: Medicare Other

## 2017-04-02 LAB — GLUCOSE, CAPILLARY
Glucose-Capillary: 59 mg/dL — ABNORMAL LOW (ref 65–99)
Glucose-Capillary: 78 mg/dL (ref 65–99)
Glucose-Capillary: 137 mg/dL — ABNORMAL HIGH (ref 65–99)
Glucose-Capillary: 282 mg/dL — ABNORMAL HIGH (ref 65–99)
Glucose-Capillary: 177 mg/dL — ABNORMAL HIGH (ref 65–99)

## 2017-04-02 NOTE — Clinical Social Work Note (Signed)
CSW continues to follow however please see RN CM documentation from yesterday. Unless plan changes and patient returns home with services, PT is still recommending SNF at discharge and thus could potentially still go back to [facility] at discharge. Patient is to transfer to 1C today due to beginning radiation. Shela Leff MSW,LCSW [phone]

## 2017-04-02 NOTE — Progress Notes (Signed)
Patient ID: Donna Lane, female   DOB: Mar 11, 1925, 83 y.o.   MRN: [id number]   [facility] PROGRESS NOTE  Donna Lane KKX:[id number] DOB: 08/17/1925 DOA: 03/21/2017 PCP: [name], MD  HPI/Subjective: Mapping for radiation therapy is canceled today because as per discussion with case manager,. Spoke to patient's daughter [name], oncologist felt patient has at most 6 weeks to live. and they decided not to pursue.. But patient's nurse told me that they could not do mapping as  She has weeping; rash in the legs.so we have conflicting information.w   Objective: Vitals:   04/01/17 2035 04/02/17 0449  BP: (!) 115/59 (!) 113/52  Pulse: 84 88  Resp: 15 16  Temp: 98 F (36.7 C) 97.5 F (36.4 C)    Filed Weights   03/31/17 0500 04/01/17 0500 04/02/17 0500  Weight: 49.4 kg (109 lb) 44.9 kg (99 lb) 40.8 kg (90 lb)    ROS: Review of Systems  Constitutional: Negative for chills and fever.  Eyes: Negative for blurred vision.  Respiratory: Positive for cough and shortness of breath.   Cardiovascular: Negative for chest pain.  Gastrointestinal: Positive for nausea. Negative for abdominal pain, constipation, diarrhea and vomiting.  Genitourinary: Negative for dysuria.  Musculoskeletal: Negative for joint pain.  Neurological: Positive for weakness. Negative for dizziness and headaches.   Exam: Physical Exam  Constitutional: She is oriented to person, place, and time.  HENT:  Nose: No mucosal edema.  Mouth/Throat: No oropharyngeal exudate or posterior oropharyngeal edema.  Eyes: Conjunctivae, EOM and lids are normal. Pupils are equal, round, and reactive to light.  Neck: No JVD present. Carotid bruit is not present. No edema present. No thyroid mass and no thyromegaly present.  Cardiovascular: S1 normal and S2 normal.  Exam reveals no gallop.   No murmur heard. Pulses:      Dorsalis pedis pulses are 2+ on the right side, and 2+ on the left side.  Respiratory: No  respiratory distress. She has decreased breath sounds in the right middle field, the right lower field, the left middle field and the left lower field. She has no wheezes. She has rhonchi in the right middle field and the left middle field. She has rales in the right lower field.  GI: Soft. Bowel sounds are normal. There is no tenderness.  Musculoskeletal:       Right wrist: She exhibits swelling.       Right ankle: She exhibits swelling.       Left ankle: She exhibits swelling.  Lymphadenopathy:    She has no cervical adenopathy.  Neurological: She is alert and oriented to person, place, and time. No cranial nerve deficit.  Skin: Skin is warm. No rash noted. Nails show no clubbing.  Psychiatric: She has a normal mood and affect.      Data Reviewed: Basic Metabolic Panel:  Recent Labs Lab 03/27/17 0605 03/28/17 1204 03/31/17 0303  NA  --  129* 137  K  --  3.3* 4.3  CL  --  84* 89*  CO2  --  38* 39*  GLUCOSE  --  154* 69  BUN  --  53* 79*  CREATININE 1.38* 1.16* 1.22*  CALCIUM  --  8.2* 8.6*   CBC:  Recent Labs Lab 03/27/17 0605 03/31/17 0303  WBC 24.0* 27.0*  HGB 11.6* 12.0  HCT 34.9* 36.7  MCV 84.9 84.6  PLT 185 186   Cardiac Enzymes: No results for input(s): CKTOTAL, CKMB, CKMBINDEX, TROPONINI in the last 168  hours. BNP (last 3 results)  Recent Labs  01/06/17 1044 03/07/17 2238 03/21/17 0209  BNP 362.0* 182.0* 226.0*      Studies: Dg Chest 2 View  Result Date: 03/31/2017 CLINICAL DATA:  Pleural effusion EXAM: CHEST  2 VIEW COMPARISON:  Chest CT 3 days ago FINDINGS: Bilateral pleural effusions, small on the left and large on the right. Right pleural fluid obscures a right anterior base mass and neighboring atelectasis. No pneumothorax. No pulmonary edema. Status post CABG. IMPRESSION: 1. Large right and small left pleural effusions. 2. Known malignancy at the right base. Electronically Signed   By: Monte Fantasia M.D.   On: 03/31/2017 14:20     Scheduled Meds: . amiodarone  200 mg Oral BID  . atorvastatin  10 mg Oral q1800  . budesonide (PULMICORT) nebulizer solution  0.5 mg Nebulization BID  . docusate sodium  100 mg Oral BID  . furosemide  40 mg Intravenous BID  . insulin aspart  0-15 Units Subcutaneous TID WC  . insulin aspart  0-5 Units Subcutaneous QHS  . insulin detemir  10 Units Subcutaneous Daily  . ipratropium-albuterol  3 mL Nebulization BID  . levothyroxine  50 mcg Oral QAC breakfast  . mouth rinse  15 mL Mouth Rinse BID  . methylPREDNISolone (SOLU-MEDROL) injection  40 mg Intravenous Daily  . protein supplement shake  11 oz Oral BID BM  . traZODone  25 mg Oral QHS  . cyanocobalamin  1,000 mcg Oral Daily    Assessment/Plan:  1. Stage 4 lung  Cancer;tumor  7 times in 4 months, now invading mediastinum,  Right atrium,patient has right  and left pleural effusion. D/w patient and family. 2. Stage 4 lung cancer; Mar[place] KitchenPrognosis is really poor, will discuss with family again [facility] catheter placement. Discussed with him multiple times about the benefits of Pleurx catheter but said they don't seem to understand this I appreciate oncology to discuss about this e and then discharge home with  hospice 3. Acute kidney injury on chronic kidney disease stage III.stable  kidney function 4. Hyperkalemia. Improved after Kayexalate given 5. Atrial fibrillation. Rate controlled with amiodarone  History of hyponatremia. Likely SIADH. 6. Type 2 diabetes mellitus on sliding scale ,po take is really poor because of odynophagia.  7. Hyperlipidemia on statin 8. Hypothermia, possible pneumonia. On Rocephin and Zithromax. Temperature came back up. Continue empiric antibiotics. 9. Poor air entry start nebulizer treatments with budesonide and Solu-Medrol 10. Thrush and difficulty swallowing. IV Diflucan. Speech therapy downgraded diet; improving yet. Likely secondary to advanced cancer. Prognosis is really poor  11. palliative care  consultation done;: CODE STATUS DO NOT RESUSCITATE. Poor prognosis  Ideally hospice candidate but pt and family refuses  Hospice placement.will get palliative care consult. D/whusband   Code Status:     Code Status Orders        Start     Ordered   03/21/17 0732  Do not attempt resuscitation (DNR)  Continuous    Question Answer Comment  In the event of cardiac or respiratory ARREST Do not call a "code blue"   In the event of cardiac or respiratory ARREST Do not perform Intubation, CPR, defibrillation or ACLS   In the event of cardiac or respiratory ARREST Use medication by any route, position, wound care, and other measures to relive pain and suffering. May use oxygen, suction and manual treatment of airway obstruction as needed for comfort.      03/21/17 0731    Code Status  History    Date Active Date Inactive Code Status Order ID Comments User Context   03/13/2017  9:43 AM 03/14/2017 12:09 AM DNR [id number]  Melton Alar, PA-C Inpatient   03/08/2017  2:15 AM 03/13/2017  9:43 AM Partial Code [id number]  Harrie Foreman, MD ED   01/07/2017  7:26 PM 01/09/2017  5:42 PM Partial Code [id number]  Vaughan Basta, MD Inpatient   01/06/2017  3:21 PM 01/07/2017  7:26 PM Full Code [id number]  Epifanio Lesches, MD ED   11/28/2016  7:40 PM 11/29/2016  3:44 PM Full Code [id number]  Vaughan Basta, MD Inpatient    Advance Directive Documentation     Most Recent Value  Type of Advance Directive  Healthcare Power of Attorney  Pre-existing out of facility DNR order (yellow form or pink MOST form)  -  "MOST" Form in Place?  -     Family Communication: Daughter, Husband and son-in-law at the bedside  Disposition Plan: To be determined  Antibiotics:  Rocephin  Zithromax  Time spent: 38 minutes, including ACP time  [employer]

## 2017-04-02 NOTE — Progress Notes (Signed)
Physical Therapy Treatment Patient Details Name: Donna Lane MRN: [id number] DOB: 07/25/1925 Today's Date: 04/02/2017    History of Present Illness Pt is a 83 y.o. female admitted for acute on chronic cystolic CHF. Pt d/c from hospital about 1 week ago for sepsis secondary to pneumonia. Pt came to the ED from [facility] withh LE weeping, weakness, SOB, hypothermia, and hyponatremia. Pt's PMH includes lung CA, a-fib, CHF, CKD stage III, COPD, CAD, DM, HLD, HTN, PVD, spinal stenosis, s/p CABG x4, L hip fracture, and lumbar disc surgery.  Pt is now declining palliative radiation and is going to schedule pluer-ex cath for symptom management. Unsure of POC rehab vs hospice    PT Comments    Pt is making limited progress towards goals. When questioned if pt wanted to keep participating in PT services, she responded with "I guess". She is still declining bed mobility secondary to dizziness noted with mobility. Encouraged to continue performing there-ex as able to maintain strength. Pt with significant weeping noted, needed chux changed. Needs +2 for incontinent episode. Will continue to progress as able.   Follow Up Recommendations  SNF     Equipment Recommendations  Rolling walker with 5" wheels    Recommendations for Other Services       Precautions / Restrictions Precautions Precautions: Fall Restrictions Weight Bearing Restrictions: No    Mobility  Bed Mobility Overal bed mobility: Needs Assistance Bed Mobility: Rolling Rolling: Min assist         General bed mobility comments: rolling to B side for diaper/linen/gown change due to incontinent episode. Rolling causes severe dizziness, refused further mobility.  Transfers                    Ambulation/Gait                 Stairs            Wheelchair Mobility    Modified Rankin (Stroke Patients Only)       Balance                                             Cognition Arousal/Alertness: Awake/alert Behavior During Therapy: WFL for tasks assessed/performed Overall Cognitive Status: Within Functional Limits for tasks assessed                                        Exercises Other Exercises Other Exercises: Supine ther-ex performed x 12 reps including ankle pumps, quad sets, SLRs, and heel slides. Attempted supine bridging x 2 reps, however fatigues quickly. Needs cues for correct technique. No pain reported    General Comments        Pertinent Vitals/Pain Pain Assessment: No/denies pain    Home Living                      Prior Function            PT Goals (current goals can now be found in the care plan section) Acute Rehab PT Goals Patient Stated Goal: to get stronger and go back home PT Goal Formulation: With patient Time For Goal Achievement: 04/07/17 Potential to Achieve Goals: Fair Progress towards PT goals: Progressing toward goals    Frequency    Min 2X/week  PT Plan Current plan remains appropriate    Co-evaluation             End of Session Equipment Utilized During Treatment: Oxygen Activity Tolerance: Patient limited by fatigue Patient left: in bed;with call bell/phone within reach;with bed alarm set Nurse Communication: Mobility status PT Visit Diagnosis: Dizziness and giddiness (R42);Muscle weakness (generalized) (M62.81);History of falling (Z91.81);Difficulty in walking, not elsewhere classified (R26.2) Pain - Right/Left: Left Pain - part of body: Arm     Time: 4784-1282 PT Time Calculation (min) (ACUTE ONLY): 17 min  Charges:  $Therapeutic Exercise: 8-22 mins                    G Codes:       Greggory Stallion, PT, DPT [phone]    Raiford Fetterman 04/02/2017, 4:56 PM

## 2017-04-02 NOTE — Telephone Encounter (Signed)
Spoke with Dr. Genevive Bi at this time. He will round on patient and arrange for Pleurx Catheter insertion to be rescheduled.   Kim at Dr. Donette Larry office has been made aware.

## 2017-04-02 NOTE — Progress Notes (Signed)
Report called to Maddie on 1C. Son, Kasandra Knudsen was updated of pt being transferred downstairs.

## 2017-04-02 NOTE — Telephone Encounter (Signed)
Kim from Dr. Baruch Gouty office at the [facility] called they spoke with the patient and would like to proceed with the Pleurx catheter to be inserted. Can you please call patient and advice.

## 2017-04-02 NOTE — Care Management (Signed)
Received message from SW that the [facility] scheduled for today has been cancelled for today and will be rescheduled for next week.  CM called cancer center and spoke with Maudie Mercury.  Informed that Dr Baruch Gouty spoke with Dr Mike Gip and Dr Baruch Gouty contacted patient's son Kasandra Knudsen by phone.  The discussion entailed not moving forward with radiation at all. Informed that a great deal of time was spent discussing with son about end of life and comfort care.  Patient's daughter was not included in this conversation.  CM contacted daughter [name] and informed that the radiation treatment is being cancelled and she asked why.  Relayed what had been reported to CM by caner center staff.  [name] felt like this was a mistake- that what should not have been performed was the pleurx catheter.  Reassured her that was not the topic of conversation.  At that time Kasandra Knudsen was calling [name] so she hung up and spoke with her brother.  Called CM back and she relayed that the physiciasn decided against performing any radiation. Informed that patient may have a life expectancy of 6 weeks or so.  CM discussed comfort and the pleurx catheter may assist with shortness of breath by keeping the fluid draining out that is constantly building up.  [name] says that she thinks the doctors do not want to pursue that now either.  CM will discuss with physician.  Discussed just to think about it.  Also discussed home with hospice or hospice home.  [name] says Kasandra Knudsen has to have some time for "all of this to sink in."  [place] and Kasandra Knudsen are going to meet with patient's husband and explain everything later today. [name] says that she does not think "I can change Momma" and asks if there is an agency that can send someone once a day to change her.  Discussed that if patient returns home, she would need round the clock care. [name] will see if patient's husband can give any idea as to whether there are financial resources for in home continuous care.

## 2017-04-02 NOTE — Progress Notes (Signed)
B.S. Was low. 4 ounces of OJ was given. B.S rechecked and was 78.

## 2017-04-02 NOTE — Progress Notes (Addendum)
Inpatient Diabetes Program Recommendations  AACE/ADA: New Consensus Statement on Inpatient Glycemic Control (2015)  Target Ranges:  Prepandial:   less than 140 mg/dL      Peak postprandial:   less than 180 mg/dL (1-2 hours)      Critically ill patients:  140 - 180 mg/dL   Results for Donna Lane, Donna Lane (MRN [id number]) as of 04/02/2017 09:23  Ref. Range 04/01/2017 08:01 04/01/2017 11:37 04/01/2017 16:10 04/01/2017 21:17  Glucose-Capillary Latest Ref Range: 65 - 99 mg/dL 150 (H) 322 (H) 238 (H) 123 (H)   Results for Donna Lane, Donna Lane (MRN [id number]) as of 04/02/2017 09:23  Ref. Range 04/02/2017 07:20 04/02/2017 07:47  Glucose-Capillary Latest Ref Range: 65 - 99 mg/dL 59 (L) 78    Home DM Meds: Levemir 13 units QHS                             Novolog 0-10 units TID per SSI  Current Insulin Orders: Levemir 10 units daily                                       Novolog Moderate Correction Scale/ SSI (0-15 units) TID AC + HS       MD- Note Patient still getting Solumedrol 40 mg daily.  CBG this AM down to 59 mg/dl but afternoon CBGs still quite elevated.  PO Intake quite poor as well.  Please consider the following if within goals of care:  1. Reduce Levemir slightly to 8 units daily  2. Change Novolog SSI coverage to Sensitive scale (0-9 units) Q4 hour coverage (currently ordered as Moderate scale 0-15 units TID AC + HS and patient has poor PO intake)    --Will follow patient during hospitalization--  Wyn Quaker RN, MSN, CDE Diabetes Coordinator Inpatient Glycemic Control Team Team Pager: [phone] (8a-5p)

## 2017-04-03 LAB — GLUCOSE, CAPILLARY
Glucose-Capillary: 125 mg/dL — ABNORMAL HIGH (ref 65–99)
Glucose-Capillary: 198 mg/dL — ABNORMAL HIGH (ref 65–99)
Glucose-Capillary: 162 mg/dL — ABNORMAL HIGH (ref 65–99)
Glucose-Capillary: 167 mg/dL — ABNORMAL HIGH (ref 65–99)

## 2017-04-03 MED ORDER — DAKINS (1/4 STRENGTH) 0.125 % EX SOLN
Freq: Every day | CUTANEOUS | Status: DC
  Administered 2017-04-03: 1
  Administered 2017-04-05 – 2017-04-08 (×4)
  Filled 2017-04-03: qty 473

## 2017-04-03 MED ORDER — FUROSEMIDE 10 MG/ML IJ SOLN
20.0000 mg | Freq: Two times a day (BID) | INTRAMUSCULAR | Status: DC
  Administered 2017-04-03: 20 mg via INTRAVENOUS
  Filled 2017-04-03: qty 2

## 2017-04-03 NOTE — Progress Notes (Addendum)
Daily Progress Note   Patient Name: Donna Lane       Date: 04/03/2017 DOB: 11/04/1925  Age: 83 y.o. MRN#: [id number] Attending Physician: Epifanio Lesches, MD Primary Care Physician: [name], MD Admit Date: 03/21/2017  Reason for Consultation/Follow-up: Establishing goals of care  Subjective: Ms. Flinders is lying in bed, just feels miserable - feels "the same."    Length of Stay: 13  Current Medications: Scheduled Meds:  . amiodarone  200 mg Oral BID  . atorvastatin  10 mg Oral q1800  . budesonide (PULMICORT) nebulizer solution  0.5 mg Nebulization BID  . docusate sodium  100 mg Oral BID  . furosemide  40 mg Intravenous BID  . insulin aspart  0-15 Units Subcutaneous TID WC  . insulin aspart  0-5 Units Subcutaneous QHS  . insulin detemir  10 Units Subcutaneous Daily  . ipratropium-albuterol  3 mL Nebulization BID  . levothyroxine  50 mcg Oral QAC breakfast  . mouth rinse  15 mL Mouth Rinse BID  . methylPREDNISolone (SOLU-MEDROL) injection  40 mg Intravenous Daily  . protein supplement shake  11 oz Oral BID BM  . sodium hypochlorite   Irrigation Daily  . traZODone  25 mg Oral QHS  . cyanocobalamin  1,000 mcg Oral Daily    Continuous Infusions:   PRN Meds: acetaminophen **OR** acetaminophen, antiseptic oral rinse, diphenhydrAMINE, menthol-cetylpyridinium, nitroGLYCERIN, ondansetron **OR** ondansetron (ZOFRAN) IV, oxyCODONE, Zinc Oxide  Physical Exam  Constitutional: She appears well-developed. She appears cachectic. She appears ill.  HENT:  Head: Normocephalic and atraumatic.  Cardiovascular: Normal rate.   Pulmonary/Chest: No accessory muscle usage. No tachypnea.  Mild-mod distress with activity  Abdominal: Normal appearance.  Neurological: She is alert.   Mostly oriented. Some memory impairment.   Nursing note and vitals reviewed.           Vital Signs: BP (!) 100/49 (BP Location: Right Arm)   Pulse 96   Temp 97.6 F (36.4 C) (Oral)   Resp 18   Ht '5\' 2"'$  (1.575 m)   Wt 40.8 kg (90 lb)   SpO2 100%   BMI 16.46 kg/m  SpO2: SpO2: 100 % O2 Device: O2 Device: Nasal Cannula O2 Flow Rate: O2 Flow Rate (L/min): 2 L/min  Intake/output summary:  No intake or output data in the 24 hours ending  04/03/17 0857 LBM: Last BM Date: 03/29/17 Baseline Weight: Weight: 63.5 kg (140 lb) Most recent weight: Weight: 40.8 kg (90 lb)       Palliative Assessment/Data:    Flowsheet Rows     Most Recent Value  Intake Tab  Referral Department  Hospitalist  Unit at Time of Referral  Med/Surg Unit  Palliative Care Primary Diagnosis  Cancer  Date Notified  03/23/17  Palliative Care Type  Return patient Palliative Care  Reason for referral  Clarify Goals of Care  Date of Admission  03/21/17  # of days IP prior to Palliative referral  2  Clinical Assessment  Psychosocial & Spiritual Assessment  Palliative Care Outcomes      Patient Active Problem List   Diagnosis Date Noted  . Lung cancer ([place])   . Goals of care, counseling/discussion   . Acute on chronic systolic CHF (congestive heart failure) ([place]) 03/21/2017  . Pressure injury of skin 03/21/2017  . DNR (do not resuscitate)   . Shortness of breath   . Pleural effusion on right   . Healthcare-associated pneumonia   . Palliative care encounter   . Primary insomnia   . Sepsis ([place]) 03/08/2017  . Edema 02/23/2017  . Chronic diastolic heart failure ([place]) 01/19/2017  . Atrial fibrillation with rapid ventricular response ([place]) 01/06/2017  . Acute respiratory distress 01/06/2017  . COPD (chronic obstructive pulmonary disease) ([place]) 01/06/2017  . Hypokalemia 01/06/2017  . Elevated troponin 11/29/2016  . Acute renal insufficiency 11/29/2016  . Hyponatremia 11/29/2016  . Mass of middle lobe of  right lung 11/29/2016  . Atelectasis of right lung 11/29/2016  . Left eye complaint 07/14/2016  . Caregiver stress 07/14/2016  . Olecranon bursitis of right elbow 06/19/2016  . Myalgia and myositis 05/20/2016  . Chronic kidney disease, stage 3 01/09/2016  . Advance care planning 01/05/2016  . Spinal stenosis of lumbar region 01/05/2016  . Diabetes mellitus type 2, uncontrolled, with complications ([place]) 32/44/0102  . Hyperlipidemia 03/14/2010  . Essential hypertension 03/14/2010  . CORONARY ATHEROSLERO AUTOL VEIN BYPASS GRAFT 03/14/2010  . Peripheral arterial disease ([place]) 03/14/2010  . Chest pain 03/14/2010    Palliative Care Assessment & Plan   HPI: 83 yo female with CAD, diastolic CHF, PVD, CKD III, DMII, diverticulosis, HTN, HLD, melanoma, spinal stenosis and pleural effusions (previous cytology benign) with lung mass obstructing right bronchus.  CT chest 03/28/17 shows aggressively enlarging mass 7 fold larger with concern of invasion of mediastinum and right atrium along with new scattered bilat pulm nodules.   Assessment: I spoke today with Ms. Wieman. I spoke vaguely as her memory from day to day is not great. We discussed no radiation. I believe she understands that she is dying. She has poor understanding of PleurX and how this is beneficial - worries about this being painful. She is drinking her protein shakes better and eating some of magic cups. She really wishes to go home but understands that she needs a lot of care at this point. I did not bring up hospice with her especially alone in the room - her daughter/HCPOA has requested we not bring this up to Ms. Chatham. I attempted to call daughter/HCPOA [name] a couple times with no answer. Left voicemail requesting return call. I am unsure if there is anyway for family to manage to take her home and if we could discuss having hospice at home if this was possible. Unsure Ms. Busch would even accept hospice at home but need  family  assistance to discuss this.   Recommendations/Plan:  Poor intake: Treat thrush. Continue Premier Protein per dietician recommendations - seems to be accepting protein shakes better.   Ms. Castillo does not desire invasive and aggressive measures.    Code Status:  DNR  Prognosis:   Weeks to months (weeks are most likely).   Discharge Planning:  To Be Determined. Ideally home with hospice but unsure that family can realistically meet her care needs.   Thank you for allowing the Palliative Medicine Team to assist in the care of this patient.   Total Time 67mn Prolonged Time Billed  no       Greater than 50%  of this time was spent counseling and coordinating care related to the above assessment and plan.  AVinie Sill NP Palliative Medicine Team Pager # [phone](M-F 8a-5p) Team Phone # [phone](Nights/Weekends)

## 2017-04-03 NOTE — Progress Notes (Signed)
Nutrition Follow-up  DOCUMENTATION CODES:   Severe malnutrition in context of chronic illness, Underweight  INTERVENTION:  Continue Premier Protein po BID between meals, each supplement provides 160 kcal and 30 grams of protein.  Continue Magic cup TID with meals, each supplement provides 290 kcal and 9 grams of protein.  NUTRITION DIAGNOSIS:   Malnutrition (Severe) related to chronic illness (COPD, CHF, obstructing lung mass, thrush) as evidenced by severe depletion of body fat, severe depletion of muscle mass.  Ongoing.  GOAL:   Patient will meet greater than or equal to 90% of their needs  Progressing.   MONITOR:   PO intake, Supplement acceptance, Labs, Weight trends, I & O's  REASON FOR ASSESSMENT:   Low [place]    ASSESSMENT:   83 year old female with PMHx of COPD, PVD, DM type 2, HTN, HLD, CAD s/p CABG x 4 in 0076, chronic diastolic heart failure, CKD stage III, obstructing lung mass (initial thoracentesis negative for malignancy but too unwell for bronchoscopy) who presented with swelling and weeping of lower extremities found to have acute diastolic CHF with hyponatremia, enlarging right pleural effusion, AKI, thrush and difficulty swallowing.  -Per chart patient will not be a candidate for palliative XRT.  -Family has not made decision regarding discharging home with hospice. Per PMT note prognosis weeks to months.   Patient sleeping at time of assessment and no family members present. Reviewed chart and discussed with RN. Patient is drinking 2-3 bottles of Premier Protein and 1-2 servings of Magic Cup daily. Based on this at minimum patient is consuming approximately 610 kcal (46% minimum estimated kcal needs) and 69 grams of protein (100% minimum estimated protein needs). At time of RD visit, patient still had breakfast tray present in room and she had only taken a few bites of one Magic Cup. She was sleeping with open bottle of Premier Protein in her hands. Family  had brought in a case of Premier Protein from home for patient (it is also kept on stock). Per chart patient still has not had a bowel movement since 4/14.   Medications reviewed and include: Colace, Lasix 20 mg BID, Novolog sliding scale TID with meals and daily at bedtime, Levemir 10 units daily, levothyroxine, methylprednisolone 40 mg daily, vitamin B12 1000 micrograms daily.  Labs reviewed: CBG 125-282 past 24 hrs. No new chem profile since last assessment.  Weight trend: 40.8 kg 4/18 (total of -22.7 kg from admission - likely largely related to fluid losses but could also be contributed to some true weight loss)  I/O: 4 unmeasured occurrences of UOP past 24 hrs  Diet Order:  DIET - DYS 1 Room service appropriate? Yes; Fluid consistency: Thin  Skin:  Wound (see comment) (DTI to coccyx)  Last BM:  03/29/2017  Height:   Ht Readings from Last 1 Encounters:  04/01/17 '5\' 2"'$  (1.575 m)    Weight:   Wt Readings from Last 1 Encounters:  04/02/17 90 lb (40.8 kg)    Ideal Body Weight:  50 kg  BMI:  Body mass index is 16.46 kg/m.  Estimated Nutritional Needs:   Kcal:  1335-1560 (30-35 kcal/kg)  Protein:  65-80 grams (1.5-1.8 grams/kg)  Fluid:  1.1 L/day (25 ml/kg)  EDUCATION NEEDS:   No education needs identified at this time  Willey Blade, MS, RD, LDN Pager: [phone] After Hours Pager: [phone]

## 2017-04-03 NOTE — Progress Notes (Signed)
Palliative:  Donna Lane is lying in bed unchanged with her husband at bedside. They both seem to understand her situation is dire. [name] says "I'd rather it be me than her" and [name] comments "only God can help me now." I have been trying to reach her daughter, [name] but have been unsuccessful and she did not return my call from yesterday. She continues to not want PleurX placement. When discussing the PleurX vs comfort with meds she told me she would prefer medications. She really needs hospice.   Recommend home with hospice (if family able to care for her). If not may not have any other option than SNF with palliative follow up.   Vinie Sill, NP Palliative Medicine Team Pager # [phone] (M-F 8a-5p) Team Phone # [phone] (Nights/Weekends)

## 2017-04-03 NOTE — Progress Notes (Signed)
Patient ID: Donna Lane, female   DOB: 1924-12-31, 83 y.o.   MRN: [id number]   [facility] PROGRESS NOTE  Donna Lane RSW:[id number] DOB: Dec 01, 1925 DOA: 03/21/2017 PCP: Donna Stain, MD  HPI/Subjective: Patient feels miserable, says that no change in her condition. Blood pressure is soft. Able to take some protein shakes.    Objective: Vitals:   04/03/17 0504 04/03/17 0822  BP: (!) 108/57 (!) 100/49  Pulse: 96 96  Resp: (!) 24 18  Temp: 98.1 F (36.7 C) 97.6 F (36.4 C)    Filed Weights   03/31/17 0500 04/01/17 0500 04/02/17 0500  Weight: 49.4 kg (109 lb) 44.9 kg (99 lb) 40.8 kg (90 lb)    ROS: Review of Systems  Constitutional: Negative for chills and fever.  Eyes: Negative for blurred vision.  Respiratory: Positive for cough and shortness of breath.   Cardiovascular: Negative for chest pain.  Gastrointestinal: Positive for nausea. Negative for abdominal pain, constipation, diarrhea and vomiting.  Genitourinary: Negative for dysuria.  Musculoskeletal: Negative for joint pain.  Neurological: Positive for weakness. Negative for dizziness and headaches.   Exam: Physical Exam  Constitutional: She is oriented to person, place, and time.  HENT:  Nose: No mucosal edema.  Mouth/Throat: No oropharyngeal exudate or posterior oropharyngeal edema.  Eyes: Conjunctivae, EOM and lids are normal. Pupils are equal, round, and reactive to light.  Neck: No JVD present. Carotid bruit is not present. No edema present. No thyroid mass and no thyromegaly present.  Cardiovascular: S1 normal and S2 normal.  Exam reveals no gallop.   No murmur heard. Pulses:      Dorsalis pedis pulses are 2+ on the right side, and 2+ on the left side.  Respiratory: No respiratory distress. She has decreased breath sounds in the right middle field, the right lower field, the left middle field and the left lower field. She has no wheezes. She has rhonchi in the right middle field and the left  middle field. She has rales in the right lower field.  GI: Soft. Bowel sounds are normal. There is no tenderness.  Musculoskeletal:       Right wrist: She exhibits swelling.       Right ankle: She exhibits swelling.       Left ankle: She exhibits swelling.  Lymphadenopathy:    She has no cervical adenopathy.  Neurological: She is alert and oriented to person, place, and time. No cranial nerve deficit.  Skin: Skin is warm. No rash noted. Nails show no clubbing.  Psychiatric: She has a normal mood and affect.      Data Reviewed: Basic Metabolic Panel:  Recent Labs Lab 03/28/17 1204 03/31/17 0303  NA 129* 137  K 3.3* 4.3  CL 84* 89*  CO2 38* 39*  GLUCOSE 154* 69  BUN 53* 79*  CREATININE 1.16* 1.22*  CALCIUM 8.2* 8.6*   CBC:  Recent Labs Lab 03/31/17 0303  WBC 27.0*  HGB 12.0  HCT 36.7  MCV 84.6  PLT 186   Cardiac Enzymes: No results for input(s): CKTOTAL, CKMB, CKMBINDEX, TROPONINI in the last 168 hours. BNP (last 3 results)  Recent Labs  01/06/17 1044 03/07/17 2238 03/21/17 0209  BNP 362.0* 182.0* 226.0*      Studies: No results found.  Scheduled Meds: . amiodarone  200 mg Oral BID  . atorvastatin  10 mg Oral q1800  . budesonide (PULMICORT) nebulizer solution  0.5 mg Nebulization BID  . docusate sodium  100 mg Oral BID  .  furosemide  40 mg Intravenous BID  . insulin aspart  0-15 Units Subcutaneous TID WC  . insulin aspart  0-5 Units Subcutaneous QHS  . insulin detemir  10 Units Subcutaneous Daily  . ipratropium-albuterol  3 mL Nebulization BID  . levothyroxine  50 mcg Oral QAC breakfast  . mouth rinse  15 mL Mouth Rinse BID  . methylPREDNISolone (SOLU-MEDROL) injection  40 mg Intravenous Daily  . protein supplement shake  11 oz Oral BID BM  . sodium hypochlorite   Irrigation Daily  . traZODone  25 mg Oral QHS  . cyanocobalamin  1,000 mcg Oral Daily    Assessment/Plan:  1. Stage 4 lung  Cancer;tumor  7 times in 4 months, now invading  mediastinum,  Right atrium,patient has right  and left pleural effusion. Stage 4 lung cancer; Donna KitchenPrognosis is really poor, Not a candidate for palliative radiation therapy as per input from Dr. Mike Lane, Dr. Eulas Lane. 2.  3. Patient has pleural effusion but she is refusing Pleurx catheter placement. She is a DO NOT RESUSCITATE, family not decided about home with hospice. Family requested that we don't bring the topic of hospice to Ms.Lane. Continue symptomatic treatment at this time, physical therapy recommended skilled nursing.    4. Acute kidney injury on chronic kidney disease stage III.stable  kidney function . 5.  6. Hyperkalemia. Improved after Kayexalate given   7. Atrial fibrillation. Rate controlled with amiodarone  History of hyponatremia. Likely SIADH.  8. Type 2 diabetes mellitus on sliding scale ,po take is really poor because of odynophagia.  Continue protein shakes 9.  10. Hyperlipidemia on statin 11.  12. Hypothermia, possible pneumonia;finisished abx. 13.  14.  15. Poor air entry start nebulizer treatments with budesonide and Solu-Medrol 16. Thrush and difficulty swallowing. IV Diflucan. Speech therapy downgraded diet; improving yet. Likely secondary to advanced cancer. Prognosis is really poor.ideally  comfort care candidate. We had a difficult situation for final disposition because patient refuses hospice, wants to go home but not sure if he she has enough support to care for at home.  son Donna Lane is not realistic about her diagnosis and quality of life and still wants to talk to her oncologist even though he was told multiple times that her cancer is aggressive" prognosis is really poor.  17. palliative care consultation done;: CODE STATUS DO NOT RESUSCITATE.   Code Status:     Code Status Orders        Start     Ordered   03/21/17 0732  Do not attempt resuscitation (DNR)  Continuous    Question Answer Comment  In the event of cardiac or respiratory ARREST Do  not call a "code blue"   In the event of cardiac or respiratory ARREST Do not perform Intubation, CPR, defibrillation or ACLS   In the event of cardiac or respiratory ARREST Use medication by any route, position, wound care, and other measures to relive pain and suffering. May use oxygen, suction and manual treatment of airway obstruction as needed for comfort.      03/21/17 0731    Code Status History    Date Active Date Inactive Code Status Order ID Comments User Context   03/13/2017  9:43 AM 03/14/2017 12:09 AM DNR [id number]  Melton Alar, PA-C Inpatient   03/08/2017  2:15 AM 03/13/2017  9:43 AM Partial Code [id number]  Harrie Foreman, MD ED   01/07/2017  7:26 PM 01/09/2017  5:42 PM Partial Code [id number]  Vaughan Basta, MD Inpatient  01/06/2017  3:21 PM 01/07/2017  7:26 PM Full Code [id number]  Epifanio Lesches, MD ED   11/28/2016  7:40 PM 11/29/2016  3:44 PM Full Code [id number]  Vaughan Basta, MD Inpatient    Advance Directive Documentation     Most Recent Value  Type of Advance Directive  Healthcare Power of Attorney  Pre-existing out of facility DNR order (yellow form or pink MOST form)  -  "MOST" Form in Place?  -     Family Communication: Daughter, Husband and son-in-law at the bedside  Disposition Plan: To be determined  Antibiotics:  Rocephin  Zithromax  Time spent: 38 minutes, including ACP time  [employer]

## 2017-04-03 NOTE — Plan of Care (Signed)
Problem: Activity: Goal: Risk for activity intolerance will decrease Outcome: Not Progressing Pt unable to ambulate or get out of bed d/t fatigue and dizziness  Problem: Nutrition: Goal: Adequate nutrition will be maintained Outcome: Progressing Pt drinking 2-3 premier protein shakes per day and eating 1-2 magic cups per day.  Problem: Bowel/Gastric: Goal: Will not experience complications related to bowel motility Outcome: Not Progressing Pt has not had a bowel movement since the 14th. Pt states she does not feel constipated

## 2017-04-03 NOTE — Progress Notes (Deleted)
Pt left via EMS at [facility]. No signs of distress at time of discharge.

## 2017-04-03 NOTE — Progress Notes (Signed)
  Patient ID: Donna Lane, female   DOB: 09-24-1925, 83 y.o.   MRN: [id number]  HISTORY: Discussed RT with Dr. Donella Stade who has recommended PleurX catheter insertion.  She states that she did get some improvement the last 2 times they performed a thoracentesis.    Vitals:   04/03/17 0504 04/03/17 0822  BP: (!) 108/57 (!) 100/49  Pulse: 96 96  Resp: (!) 24 18  Temp: 98.1 F (36.7 C) 97.6 F (36.4 C)     EXAM:    Resp: Lungs are diminished on the right Heart:  Regular without murmurs Neurological: Alert and oriented  Skin: Skin is warm and dry. She does have some upper extremity swelling    ASSESSMENT: I again reviewed the PleurX catheter insertion with the patient and son.  She does not want to have this done.  Her son states that he would prefer to just have periodic thoracentesis done.    PLAN:   Will sign off.  No surgical procedure is desired.      Nestor Lewandowsky, MD

## 2017-04-03 NOTE — Progress Notes (Signed)
Speech Therapy Note: reviewed chart notes; consulted NSG re: pt's status. Pt is eating/drinking a little but is still bothered by a sore throat d/t thrush(being treated). No overt s/s of aspiration have been noted per NSG report. She is followed by Palliative Care for poc goals d/t lung mass obstructing the right bronchus.  ST services will be available for any further education as needed while admitted. Recommend frequent oral care; possible trials of solids next 1-2 days if pt desires. NSG agreed.    Orinda Kenner, [place], CCC-SLP

## 2017-04-04 DIAGNOSIS — C50911 Malignant neoplasm of unspecified site of right female breast: Secondary | ICD-10-CM

## 2017-04-04 DIAGNOSIS — R601 Generalized edema: Secondary | ICD-10-CM

## 2017-04-04 DIAGNOSIS — C50912 Malignant neoplasm of unspecified site of left female breast: Secondary | ICD-10-CM

## 2017-04-04 LAB — BASIC METABOLIC PANEL
Anion gap: 9 (ref 5–15)
BUN: 84 mg/dL — AB (ref 6–20)
CHLORIDE: 89 mmol/L — AB (ref 101–111)
CO2: 40 mmol/L — AB (ref 22–32)
Calcium: 8.9 mg/dL (ref 8.9–10.3)
Creatinine, Ser: 1.02 mg/dL — ABNORMAL HIGH (ref 0.44–1.00)
GFR calc Af Amer: 55 mL/min — ABNORMAL LOW (ref >60)
GFR calc non Af Amer: 47 mL/min — ABNORMAL LOW (ref >60)
GLUCOSE: 95 mg/dL (ref 65–99)
POTASSIUM: 3.9 mmol/L (ref 3.5–5.1)
Sodium: 138 mmol/L (ref 135–145)

## 2017-04-04 LAB — GLUCOSE, CAPILLARY
Glucose-Capillary: 87 mg/dL (ref 65–99)
Glucose-Capillary: 223 mg/dL — ABNORMAL HIGH (ref 65–99)
Glucose-Capillary: 228 mg/dL — ABNORMAL HIGH (ref 65–99)
Glucose-Capillary: 278 mg/dL — ABNORMAL HIGH (ref 65–99)

## 2017-04-04 MED ORDER — ENOXAPARIN SODIUM 30 MG/0.3ML ~~LOC~~ SOLN
30.0000 mg | SUBCUTANEOUS | Status: DC
  Administered 2017-04-04 – 2017-04-07 (×4): 30 mg via SUBCUTANEOUS
  Filled 2017-04-04 (×4): qty 0.3

## 2017-04-04 MED ORDER — FUROSEMIDE 20 MG PO TABS
20.0000 mg | ORAL_TABLET | Freq: Two times a day (BID) | ORAL | Status: DC
  Administered 2017-04-04 – 2017-04-08 (×8): 20 mg via ORAL
  Filled 2017-04-04 (×8): qty 1

## 2017-04-04 MED ORDER — PREDNISONE 50 MG PO TABS
50.0000 mg | ORAL_TABLET | Freq: Every day | ORAL | Status: DC
  Administered 2017-04-05 – 2017-04-08 (×4): 50 mg via ORAL
  Filled 2017-04-04 (×4): qty 1

## 2017-04-04 NOTE — Care Management Important Message (Signed)
Important Message  Patient Details  Name: Donna Lane MRN: [id number] Date of Birth: 08/18/1925   Medicare Important Message Given:  Yes    Katrina Stack, RN 04/04/2017, 6:37 PM

## 2017-04-04 NOTE — Clinical Social Work Note (Signed)
CSW is continuing to follow for discharge planning needs. Disposition is either possible return to facility or home with services. CSW will continue to follow.   Darden Dates, MSW, LCSW  Clinical Social Worker  [phone]

## 2017-04-04 NOTE — Care Management (Signed)
Spoke with daughter [name] about the need to identify a specific discharge plan.  Dispositions discussed: home with hospice- discussed the different agencies-  (will be in need of hospital bed and additional caregivers) Hospice home.  Discussed that patient has fear that a hospice plan of care would be a plan that might "speed" her dying.  If patient did not want "hospice" could have a referral to a home health agency but that may not best meet the patient's or family needs. [name] has the list of agencies that can provide in home assistance. Discussed that continued hospitalization while attempts are made to get some members of the family to accept prognosis would not meet medical necessity. Patient's son is having the hardest time accepting this prognosis.

## 2017-04-04 NOTE — Progress Notes (Signed)
Pt suspected impacted.  Paged Dr Paulene Floor. Order for soap suds enema and disimpaction

## 2017-04-04 NOTE — Plan of Care (Signed)
Problem: Bowel/Gastric: Goal: Will not experience complications related to bowel motility Outcome: Progressing Soap suds enema and disimpaction done

## 2017-04-04 NOTE — Progress Notes (Signed)
Speech Therapy Note: reviewed chart notes; consulted Palliative Care nurse re: pt's medical status. Pt appears to be moving to more of a comfort care status and wants to return home w/ Hospice vs Hospice Home. Pt medical status is declining per MD notes. She is able to tolerate sips and bites of foods w/ no overt s/s of aspiration noted. Recommend continue w/ current diet and aspiration precautions including comfort foods if easy to eat/swallow; monitoring w/ po intake; oral care daily. NSG to reconsult ST services if indicated while admitted.    Orinda Kenner, [place], CCC-SLP

## 2017-04-04 NOTE — Progress Notes (Signed)
Palliative Medicine RN Note: Spoke with Dr Tressia Miners, Vernell Morgans, and [facility]. They are all working on d/c plan; there is not a role for PMT to intervene today. We will continue to follow peripherally and will f/u next week if the pt is still admitted.  Marjie Skiff Namari Breton, RN, BSN, [facility] 04/04/2017 4:00 PM Cell [phone] 8:00-4:00 Monday-Friday Office [phone]

## 2017-04-04 NOTE — Care Management (Signed)
Spoke with patient and husband about discharge options- home with home hospice or hospice home.  Patient asks if hospice "will send me on"?  Questioned what she meant and confirm that hospice would not hasten end of life for her.  Discussed that it is all about comfort and allow her to live the rest of her days the way she would wish.  Husband asks many questions about conversations that physicians have had with son Kasandra Knudsen. He ask why nothing is being done about the swelling in her arm.  Discussed that this is part of the disease process. Says that Kasandra Knudsen has stated that a physician told him patient "could not have the procedure procedure because it would not be covered by insurance." (not clear if he is talking about radiation or pleurx catheter).  Patient says she does not want the pleurx. She and husband agree about the strong desire to go home and would agree with hospice but would need more caregiver support.  Provided list of agencies that provide in home services.  CM again discussed the need to assess financial resources to determine if help can be hired at home. Both agree would not have ability to pay for skilled nursing.  Discussed the need to move forward either home with services or hospice home to enhance the qualify of patient's life as much as possible.  Left voicemail message with patient's daughter.  If patient discharges home, has home 02.  Would need hospital bed and maybe bsc

## 2017-04-04 NOTE — Progress Notes (Signed)
Hematology/Oncology Consult note [facility]  [phone] [phone]  Patient Care Team: Tonia Ghent, MD as PCP - General (Family Medicine) Minna Merritts, MD as Consulting Physician (Cardiology) Alisa Graff, FNP as Nurse Practitioner (Family Medicine) Flora Lipps, MD as Consulting Physician (Pulmonary Disease)   Name of the patient: Donna Lane  [id number]  [birth year]    Diagnosis- stage 4 lung cancer    Interval history- reports feeling SOB and fatigued. She has been unable to get OOB  ECOG PS- 4 Pain scale- 0  Review of systems- Review of Systems  Constitutional: Positive for malaise/fatigue. Negative for chills, fever and weight loss.  HENT: Negative for congestion, ear discharge and nosebleeds.   Eyes: Negative for blurred vision.  Respiratory: Positive for shortness of breath. Negative for cough, hemoptysis, sputum production and wheezing.   Cardiovascular: Negative for chest pain, palpitations, orthopnea and claudication.  Gastrointestinal: Negative for abdominal pain, blood in stool, constipation, diarrhea, heartburn, melena, nausea and vomiting.  Genitourinary: Negative for dysuria, flank pain, frequency, hematuria and urgency.  Musculoskeletal: Negative for back pain, joint pain and myalgias.  Skin: Negative for rash.  Neurological: Positive for weakness. Negative for dizziness, tingling, focal weakness, seizures and headaches.  Endo/Heme/Allergies: Does not bruise/bleed easily.  Psychiatric/Behavioral: Negative for depression and suicidal ideas. The patient does not have insomnia.       Allergies  Allergen Reactions  . Nsaids Other (See Comments)    Would avoid due to CKD.... Pt states she not allergic     Past Medical History:  Diagnosis Date  . Arrhythmia    atrial fibrillation  . Chronic diastolic heart failure ([place])   . CKD (chronic kidney disease), stage III   . COPD (chronic obstructive  pulmonary disease) ([place])   . Coronary artery disease 2008   CABG x 4,   . Diabetes mellitus   . Diverticulosis    on colonoscopy 08/12/2006  . Hyperlipidemia   . Hypertension   . Melanoma ([place])    removed by derm   . PVD (peripheral vascular disease) ([place])   . Spinal stenosis      Past Surgical History:  Procedure Laterality Date  . CARDIAC CATHETERIZATION     PTCA x 3  . CARDIOVERSION N/A 03/07/2017   Procedure: Cardioversion;  Surgeon: Minna Merritts, MD;  Location: [facility] ORS;  Service: Cardiovascular;  Laterality: N/A;  . CATARACT EXTRACTION    . CORONARY ARTERY BYPASS GRAFT  2008   x 4  . HIP FRACTURE SURGERY Left    pins placed w/o hip replacement, hardware removed later  . LUMBAR DISC SURGERY     in her 37s  . tonsillectomy    . VAGINAL DELIVERY     x2    Social History   Social History  . Marital status: Married    Spouse name: N/A  . Number of children: N/A  . Years of education: N/A   Occupational History  . Not on file.   Social History Main Topics  . Smoking status: Former Smoker    Packs/day: 1.00    Years: 50.00    Types: Cigarettes    Quit date: 03/20/2007  . Smokeless tobacco: Never Used  . Alcohol use No  . Drug use: No  . Sexual activity: No   Other Topics Concern  . Not on file   Social History Narrative   [facility] [place] [place]    Married 1953   2 kids,  local.    Retired.     Family History  Problem Relation Age of Onset  . Heart disease Father   . Heart disease Sister   . Diabetes Sister      Current Facility-Administered Medications:  .  acetaminophen (TYLENOL) tablet 650 mg, 650 mg, Oral, Q6H PRN, 650 mg at 03/31/17 2215 **OR** acetaminophen (TYLENOL) suppository 650 mg, 650 mg, Rectal, Q6H PRN, Harrie Foreman, MD .  amiodarone (PACERONE) tablet 200 mg, 200 mg, Oral, BID, Harrie Foreman, MD, 200 mg at 04/04/17 0737 .  antiseptic oral rinse (BIOTENE) solution 15 mL, 15 mL, Mouth Rinse, PRN, Loletha Grayer, MD .   atorvastatin (LIPITOR) tablet 10 mg, 10 mg, Oral, q1800, Harrie Foreman, MD, 10 mg at 04/03/17 1722 .  budesonide (PULMICORT) nebulizer solution 0.5 mg, 0.5 mg, Nebulization, BID, Loletha Grayer, MD, 0.5 mg at 04/04/17 0738 .  diphenhydrAMINE (BENADRYL) capsule 25 mg, 25 mg, Oral, QHS PRN, Harrie Foreman, MD .  docusate sodium (COLACE) capsule 100 mg, 100 mg, Oral, BID, Harrie Foreman, MD, 100 mg at 04/04/17 1062 .  furosemide (LASIX) injection 20 mg, 20 mg, Intravenous, BID, Epifanio Lesches, MD, 20 mg at 04/03/17 1722 .  insulin aspart (novoLOG) injection 0-15 Units, 0-15 Units, Subcutaneous, TID WC, Epifanio Lesches, MD, 3 Units at 04/03/17 1700 .  insulin aspart (novoLOG) injection 0-5 Units, 0-5 Units, Subcutaneous, QHS, Epifanio Lesches, MD, 2 Units at 03/31/17 2214 .  insulin detemir (LEVEMIR) injection 10 Units, 10 Units, Subcutaneous, Daily, Epifanio Lesches, MD, 10 Units at 04/04/17 0917 .  ipratropium-albuterol (DUONEB) 0.5-2.5 (3) MG/3ML nebulizer solution 3 mL, 3 mL, Nebulization, BID, Epifanio Lesches, MD, 3 mL at 04/04/17 0738 .  levothyroxine (SYNTHROID, LEVOTHROID) tablet 50 mcg, 50 mcg, Oral, QAC breakfast, Harrie Foreman, MD, 50 mcg at 04/04/17 [phone] .  MEDLINE mouth rinse, 15 mL, Mouth Rinse, BID, Loletha Grayer, MD, 15 mL at 04/04/17 0919 .  menthol-cetylpyridinium (CEPACOL) lozenge 3 mg, 1 lozenge, Oral, PRN, Harrie Foreman, MD .  methylPREDNISolone sodium succinate (SOLU-MEDROL) 40 mg/mL injection 40 mg, 40 mg, Intravenous, Daily, Loletha Grayer, MD, 40 mg at 04/04/17 0917 .  nitroGLYCERIN (NITROSTAT) SL tablet 0.4 mg, 0.4 mg, Sublingual, Q5 min PRN, Harrie Foreman, MD .  ondansetron [facility]) tablet 4 mg, 4 mg, Oral, Q6H PRN **OR** ondansetron (ZOFRAN) injection 4 mg, 4 mg, Intravenous, Q6H PRN, Harrie Foreman, MD .  oxyCODONE (Oxy IR/ROXICODONE) immediate release tablet 2.5 mg, 2.5 mg, Oral, Q4H PRN, Harrie Foreman, MD, 2.5 mg at  03/22/17 2229 .  protein supplement (PREMIER PROTEIN) liquid, 11 oz, Oral, BID BM, Epifanio Lesches, MD, 11 oz at 04/04/17 0932 .  sodium hypochlorite (DAKIN'S 1/4 STRENGTH) topical solution, , Irrigation, Daily, Harrie Foreman, MD, Stopped at 04/03/17 1000 .  traZODone (DESYREL) tablet 25 mg, 25 mg, Oral, QHS, Harrie Foreman, MD, 25 mg at 04/03/17 2346 .  vitamin B-12 (CYANOCOBALAMIN) tablet 1,000 mcg, 1,000 mcg, Oral, Daily, Harrie Foreman, MD, 1,000 mcg at 04/04/17 [phone] .  Zinc Oxide (TRIPLE PASTE) 12.8 % ointment, , Topical, PRN, Pershing Proud, NP  Physical exam:  Vitals:   04/03/17 2031 04/04/17 0506 04/04/17 0738 04/04/17 0742  BP:  111/60  (!) 107/45  Pulse:  96  98  Resp:  (!) 22  20  Temp:  97.7 F (36.5 C)  98.5 F (36.9 C)  TempSrc:  Oral  Oral  SpO2: 97% 97% 100% 100%  Weight:  89  lb (40.4 kg)    Height:       Physical Exam  Constitutional:  Frail appearing, fatigued laying in bed. No acute distress  HENT:  Head: Normocephalic and atraumatic.  Eyes: EOM are normal. Pupils are equal, round, and reactive to light.  Neck: Normal range of motion.  Cardiovascular: Normal rate, regular rhythm and normal heart sounds.   Pulmonary/Chest: Effort normal.  Breath sounds decreased bilaterally R >L  Abdominal: Soft. Bowel sounds are normal.  Neurological: She is alert.  Skin: Skin is warm and dry.     CMP Latest Ref Rng & Units 04/04/2017  Glucose 65 - 99 mg/dL 95  BUN 6 - 20 mg/dL 84(H)  Creatinine 0.44 - 1.00 mg/dL 1.02(H)  Sodium 135 - 145 mmol/L 138  Potassium 3.5 - 5.1 mmol/L 3.9  Chloride 101 - 111 mmol/L 89(L)  CO2 22 - 32 mmol/L 40(H)  Calcium 8.9 - 10.3 mg/dL 8.9  Total Protein 6.5 - 8.1 g/dL -  Total Bilirubin 0.3 - 1.2 mg/dL -  Alkaline Phos 38 - 126 U/L -  AST 15 - 41 U/L -  ALT 14 - 54 U/L -   CBC Latest Ref Rng & Units 03/31/2017  WBC 3.6 - 11.0 K/uL 27.0(H)  Hemoglobin 12.0 - 16.0 g/dL 12.0  Hematocrit 35.0 - 47.0 % 36.7  Platelets 150  - 440 K/uL 186    '@IMAGES'$ @  Ct Abdomen Pelvis Wo Contrast  Result Date: 03/28/2017 CLINICAL DATA:  Recent pneumonia. Pleural effusion and lower extremity edema. Poor renal function. The patient had an obstructing mass in her right middle lobe on prior chest CT, but based on medical records from that time apparently refused any workup. EXAM: CT CHEST, ABDOMEN AND PELVIS WITHOUT CONTRAST TECHNIQUE: Multidetector CT imaging of the chest, abdomen and pelvis was performed following the standard protocol without IV contrast. COMPARISON:  11/28/2016 FINDINGS: Despite efforts by the technologist and patient, motion artifact is present on today's exam and could not be eliminated. This reduces exam sensitivity and specificity. CT CHEST FINDINGS Cardiovascular: Coronary, aortic arch, and branch vessel atherosclerotic vascular disease. Mediastinum/Nodes: Stable paratracheal lymph nodes including a 0.8 cm lower paratracheal lymph node on image 23/2. Lungs/Pleura: The right middle lobe mass has considerably enlarged, currently about 10.0 by 8.4 by 8.7 cm (volume = 380 cm^3), previously estimated at about 4.6 cm in diameter. This mass is now invading the mediastinum, compressing the SVC, an compressing or invading the a right atrium. There is continued postobstructive atelectasis of the right middle lobe as well as new complete obstruction of the right lower lobe bronchi by the mass. There is atelectasis of the right middle lobe and right lower lobe. Scattered in the lungs there are bilateral new pulmonary nodules favoring bilateral pulmonary metastatic disease. An index left upper lobe nodule measures 0.7 by 0.7 cm on image 22/4 but numerous additional nodules are present. Large right and moderate left pleural effusion, nonspecific for malignant effusion, nonspecific for transudative versus exudative etiology. However given the mediastinal invasion, some pleural tumor involvement is expected on the right side.  Musculoskeletal: Degenerative glenohumeral arthropathy, left greater than right. Thoracic spondylosis. Prior median sternotomy. Posterior osseous ridging at the T4-5 level. CT ABDOMEN PELVIS FINDINGS Hepatobiliary: There is some mild dependent density in the gallbladder which could represent sludge or possibly small gallstones. Gallbladder is obscured by motion artifact. Pancreas: Unremarkable Spleen: Unremarkable Adrenals/Urinary Tract: Adrenal glands obscured by motion artifact, there is some mild stable fullness of the left adrenal gland  without a discrete mass. Distended urinary bladder. No hydronephrosis. The patient has a known left mid kidney hypodense peripheral lesion which is nearly completely obscured on today' s exam due to motion artifact. Stomach/Bowel: Grossly unremarkable Vascular/Lymphatic: Aortoiliac atherosclerotic vascular disease. Reproductive: Unremarkable Other: Diffuse mild subcutaneous edema. Musculoskeletal: Deformity left proximal hip likely from old fracture, with evidence of prior left screw tracts. Degenerative arthropathy of both hips. Lumbar spondylosis and degenerative disc disease most notably at L3-4, L4-5, and L5-S 1 with impingement at all 3 of these levels. IMPRESSION: 1. The right middle lobe mass has increased over 7 times in volume over the past 4 months, and is now invading the mediastinum and possibly invading the right atrium. Scattered new bilateral pulmonary nodules now compatible with stage IV lung cancer. The mass obstructs the right middle lobe bronchi in the right lower lobe bronchi. 2. Large right and moderate left pleural effusions. 3. Other imaging findings of potential clinical significance: Coronary, aortic arch, and branch vessel atherosclerotic vascular disease. Aortoiliac atherosclerotic vascular disease. Possible gallstones. Diffuse mild subcutaneous edema. Lumbar spondylosis and degenerative disc disease causing lower lumbar impingement. Electronically  Signed   By: Van Clines M.D.   On: 03/28/2017 13:30   Dg Chest 1 View  Result Date: 03/27/2017 CLINICAL DATA:  83 year old female with a history of pleural effusion EXAM: CHEST 1 VIEW COMPARISON:  03/25/2017, 03/23/2017 FINDINGS: Cardiomediastinal silhouette likely unchanged with the right heart border partially obscured by lung/pleural disease. Calcifications of the aortic arch. Surgical changes median sternotomy and CABG. Left lung relatively well aerated. Minimal coarsened interstitial markings. Dense opacity at the right base, greater than the comparison plain film. Fluid within the minor fissure. No evidence of pneumothorax. IMPRESSION: Plain film demonstrates increasing right-sided pleural effusion and associated atelectasis/ consolidation. Aortic atherosclerosis. Surgical changes median sternotomy and CABG. Electronically Signed   By: Corrie Mckusick D.O.   On: 03/27/2017 14:21   Dg Chest 1 View  Result Date: 03/11/2017 CLINICAL DATA:  Status post right thoracentesis. EXAM: CHEST 1 VIEW COMPARISON:  Radiographs of March 07, 2017. FINDINGS: Stable cardiomediastinal silhouette. Atherosclerosis of thoracic aorta is noted. Status post coronary artery bypass graft. No pneumothorax is noted. Mild left pleural effusion is noted. Right pleural effusion is noted which is improved compared to prior exam. Bony thorax is unremarkable. IMPRESSION: No pneumothorax status post right-sided thoracentesis. Electronically Signed   By: Marijo Conception, M.D.   On: 03/11/2017 11:40   Dg Chest 2 View  Result Date: 03/31/2017 CLINICAL DATA:  Pleural effusion EXAM: CHEST  2 VIEW COMPARISON:  Chest CT 3 days ago FINDINGS: Bilateral pleural effusions, small on the left and large on the right. Right pleural fluid obscures a right anterior base mass and neighboring atelectasis. No pneumothorax. No pulmonary edema. Status post CABG. IMPRESSION: 1. Large right and small left pleural effusions. 2. Known malignancy at the  right base. Electronically Signed   By: Monte Fantasia M.D.   On: 03/31/2017 14:20   Dg Chest 2 View  Result Date: 03/07/2017 CLINICAL DATA:  Shortness of breath with leg swelling EXAM: CHEST  2 VIEW COMPARISON:  01/06/2017 FINDINGS: Post sternotomy changes. Small left-sided pleural effusion. Suspect moderate right-sided pleural effusion. There is consolidation in the right middle lobe and right lower lobe. Stable cardiomegaly. No pneumothorax. IMPRESSION: 1. Small left pleural effusion. Suspect a moderate right pleural effusion. 2. Dense right middle lobe and lung base consolidation 3. Cardiomegaly Electronically Signed   By: Madie Reno.D.  On: 03/07/2017 23:13   Ct Head Wo Contrast  Result Date: 03/30/2017 CLINICAL DATA:  83 year old female with dizziness. Metastatic lung cancer. EXAM: CT HEAD WITHOUT CONTRAST TECHNIQUE: Contiguous axial images were obtained from the base of the skull through the vertex without intravenous contrast. COMPARISON:  Head CT without contrast 12/18/2005. FINDINGS: Brain: Cerebral volume has decreased since 2007 but remains normal for age. No midline shift, ventriculomegaly, mass effect, evidence of mass lesion, intracranial hemorrhage or evidence of cortically based acute infarction. Gray-white matter differentiation is within normal limits throughout the brain. No contrast administered. Vascular: Calcified atherosclerosis at the skull base. No suspicious intracranial vascular hyperdensity. Skull: Motion artifact at the skullbase. No acute or suspicious osseous lesion identified. Sinuses/Orbits: Visualized paranasal sinuses and mastoids are stable and well pneumatized. Other: Negative orbit and scalp soft tissues. IMPRESSION: Negative for age noncontrast CT appearance of the brain. Note that early metastatic disease to the brain cannot be excluded in the absence of intravenous contrast. Electronically Signed   By: Genevie Ann M.D.   On: 03/30/2017 19:20   Ct Chest Wo  Contrast  Result Date: 03/28/2017 CLINICAL DATA:  Recent pneumonia. Pleural effusion and lower extremity edema. Poor renal function. The patient had an obstructing mass in her right middle lobe on prior chest CT, but based on medical records from that time apparently refused any workup. EXAM: CT CHEST, ABDOMEN AND PELVIS WITHOUT CONTRAST TECHNIQUE: Multidetector CT imaging of the chest, abdomen and pelvis was performed following the standard protocol without IV contrast. COMPARISON:  11/28/2016 FINDINGS: Despite efforts by the technologist and patient, motion artifact is present on today's exam and could not be eliminated. This reduces exam sensitivity and specificity. CT CHEST FINDINGS Cardiovascular: Coronary, aortic arch, and branch vessel atherosclerotic vascular disease. Mediastinum/Nodes: Stable paratracheal lymph nodes including a 0.8 cm lower paratracheal lymph node on image 23/2. Lungs/Pleura: The right middle lobe mass has considerably enlarged, currently about 10.0 by 8.4 by 8.7 cm (volume = 380 cm^3), previously estimated at about 4.6 cm in diameter. This mass is now invading the mediastinum, compressing the SVC, an compressing or invading the a right atrium. There is continued postobstructive atelectasis of the right middle lobe as well as new complete obstruction of the right lower lobe bronchi by the mass. There is atelectasis of the right middle lobe and right lower lobe. Scattered in the lungs there are bilateral new pulmonary nodules favoring bilateral pulmonary metastatic disease. An index left upper lobe nodule measures 0.7 by 0.7 cm on image 22/4 but numerous additional nodules are present. Large right and moderate left pleural effusion, nonspecific for malignant effusion, nonspecific for transudative versus exudative etiology. However given the mediastinal invasion, some pleural tumor involvement is expected on the right side. Musculoskeletal: Degenerative glenohumeral arthropathy, left  greater than right. Thoracic spondylosis. Prior median sternotomy. Posterior osseous ridging at the T4-5 level. CT ABDOMEN PELVIS FINDINGS Hepatobiliary: There is some mild dependent density in the gallbladder which could represent sludge or possibly small gallstones. Gallbladder is obscured by motion artifact. Pancreas: Unremarkable Spleen: Unremarkable Adrenals/Urinary Tract: Adrenal glands obscured by motion artifact, there is some mild stable fullness of the left adrenal gland without a discrete mass. Distended urinary bladder. No hydronephrosis. The patient has a known left mid kidney hypodense peripheral lesion which is nearly completely obscured on today' s exam due to motion artifact. Stomach/Bowel: Grossly unremarkable Vascular/Lymphatic: Aortoiliac atherosclerotic vascular disease. Reproductive: Unremarkable Other: Diffuse mild subcutaneous edema. Musculoskeletal: Deformity left proximal hip likely from old fracture, with  evidence of prior left screw tracts. Degenerative arthropathy of both hips. Lumbar spondylosis and degenerative disc disease most notably at L3-4, L4-5, and L5-S 1 with impingement at all 3 of these levels. IMPRESSION: 1. The right middle lobe mass has increased over 7 times in volume over the past 4 months, and is now invading the mediastinum and possibly invading the right atrium. Scattered new bilateral pulmonary nodules now compatible with stage IV lung cancer. The mass obstructs the right middle lobe bronchi in the right lower lobe bronchi. 2. Large right and moderate left pleural effusions. 3. Other imaging findings of potential clinical significance: Coronary, aortic arch, and branch vessel atherosclerotic vascular disease. Aortoiliac atherosclerotic vascular disease. Possible gallstones. Diffuse mild subcutaneous edema. Lumbar spondylosis and degenerative disc disease causing lower lumbar impingement. Electronically Signed   By: Van Clines M.D.   On: 03/28/2017 13:30    US Venous Img Upper Uni Left  Result Date: 03/10/2017 CLINICAL DATA:  Left hand swelling EXAM: LEFT UPPER EXTREMITY VENOUS DOPPLER ULTRASOUND TECHNIQUE: Gray-scale sonography with graded compression, as well as color Doppler and duplex ultrasound were performed to evaluate the upper extremity deep venous system from the level of the subclavian vein and including the jugular, axillary, basilic, radial, ulnar and upper cephalic vein. Spectral Doppler was utilized to evaluate flow at rest and with distal augmentation maneuvers. COMPARISON:  None. FINDINGS: Contralateral Subclavian Vein: Respiratory phasicity is normal and symmetric with the symptomatic side. No evidence of thrombus. Normal compressibility. Internal Jugular Vein: No evidence of thrombus. Normal compressibility, respiratory phasicity and response to augmentation. Subclavian Vein: No evidence of thrombus. Normal compressibility, respiratory phasicity and response to augmentation. Axillary Vein: No evidence of thrombus. Normal compressibility, respiratory phasicity and response to augmentation. Cephalic Vein: No evidence of thrombus. Normal compressibility, respiratory phasicity and response to augmentation. Basilic Vein: No evidence of thrombus. Normal compressibility, respiratory phasicity and response to augmentation. Brachial Veins: No evidence of thrombus. Normal compressibility, respiratory phasicity and response to augmentation. Radial Veins: No evidence of thrombus. Normal compressibility, respiratory phasicity and response to augmentation. Ulnar Veins: No evidence of thrombus. Normal compressibility, respiratory phasicity and response to augmentation. Venous Reflux:  None visualized. Other Findings:  None visualized. IMPRESSION: No evidence of deep venous thrombosis. Electronically Signed   By: Julian Hy M.D.   On: 03/10/2017 16:33   Dg Chest Port 1 View  Result Date: 03/25/2017 CLINICAL DATA:  Post thoracentesis EXAM: PORTABLE  CHEST 1 VIEW COMPARISON:  03/23/2017 FINDINGS: Decreasing right effusion following thoracentesis. Moderate right effusion remains. No pneumothorax. Right base atelectasis. Heart is borderline in size. Prior CABG. No focal opacity on the left. IMPRESSION: Moderate right effusion, decreasing following right thoracentesis. No pneumothorax. Electronically Signed   By: Rolm Baptise M.D.   On: 03/25/2017 09:45   Dg Chest Port 1 View  Result Date: 03/23/2017 CLINICAL DATA:  83 y/o  F; cough.  History of lung cancer. EXAM: PORTABLE CHEST 1 VIEW COMPARISON:  03/21/2017 chest radiograph. FINDINGS: Stable cardiomegaly partially obscured by large right effusion. Aortic atherosclerosis with calcification. Post CABG with sternotomy wires and alignment. Large right and small left pleural effusions, probably increased on the right from prior chest radiograph. Right mid and lower lung zone opacification. IMPRESSION: Large right and small left pleural effusions probably increased on the right in comparison with prior chest radiograph. Opacification of right mid and lower lung zones and left lower lobe may represent atelectasis or pneumonia. Right lung mass is obscured by the effusion. Electronically Signed  By: Kristine Garbe M.D.   On: 03/23/2017 05:53   Dg Chest Port 1 View  Result Date: 03/21/2017 CLINICAL DATA:  Weakness and lightheadedness. Lower extremity edema. EXAM: PORTABLE CHEST 1 VIEW COMPARISON:  03/11/2017 FINDINGS: Pleural effusions bilaterally, increased from 03/11/2017. Diffuse interstitial fluid or thickening. Moderate vascular prominence. Central and basilar airspace opacities, right greater than left. IMPRESSION: The findings probably represent congestive heart failure with interstitial and alveolar edema as well as bilateral pleural effusions. Infectious infiltrates are not entirely excluded. Electronically Signed   By: Andreas Newport M.D.   On: 03/21/2017 01:52   US Thoracentesis Asp Pleural  Space W/img Guide  Result Date: 03/25/2017 INDICATION: Right pleural effusion EXAM: ULTRASOUND GUIDED RIGHT THORACENTESIS MEDICATIONS: None. COMPLICATIONS: None immediate. PROCEDURE: An ultrasound guided thoracentesis was thoroughly discussed with the patient and questions answered. The benefits, risks, alternatives and complications were also discussed. The patient understands and wishes to proceed with the procedure. Written consent was obtained. Ultrasound was performed to localize and mark an adequate pocket of fluid in the right chest. The area was then prepped and draped in the normal sterile fashion. 1% Lidocaine was used for local anesthesia. Under ultrasound guidance a Safe-T-Centesis catheter was introduced. Thoracentesis was performed. The catheter was removed and a dressing applied. FINDINGS: A total of approximately 1 L of clear yellow fluid was removed. Samples were sent to the laboratory as requested by the clinical team. IMPRESSION: Successful ultrasound guided right thoracentesis yielding 1 L of pleural fluid. Electronically Signed   By: Marybelle Killings M.D.   On: 03/25/2017 13:07   US Thoracentesis Asp Pleural Space W/img Guide  Result Date: 03/11/2017 INDICATION: Right pleural effusion. EXAM: ULTRASOUND GUIDED right THORACENTESIS MEDICATIONS: None. COMPLICATIONS: None immediate. PROCEDURE: An ultrasound guided thoracentesis was thoroughly discussed with the patient and questions answered. The benefits, risks, alternatives and complications were also discussed. The patient understands and wishes to proceed with the procedure. Written consent was obtained. Ultrasound was performed to localize and mark an adequate pocket of fluid in the right chest. The area was then prepped and draped in the normal sterile fashion. 1% Lidocaine was used for local anesthesia. Under ultrasound guidance a 8 Fr Safe-T-Centesis catheter was introduced. Thoracentesis was performed. The catheter was removed and a  dressing applied. FINDINGS: A total of approximately 1500 mL of serous fluid was removed. Samples were sent to the laboratory as requested by the clinical team. IMPRESSION: Successful ultrasound guided right thoracentesis yielding 1.5 L of pleural fluid. Electronically Signed   By: Marijo Conception, M.D.   On: 03/11/2017 11:41     Assessment and plan- Patient is a 83 y.o. female with large lung mass causing mediastinal invasion, SVC syndrome, b/l pleural effusions and b/l lung nodules consistent with stage IV lung cancer based on CT  I could not get in touch with patients daughter cheryl on 4/19 and 4/20. I spoke to patient and her husband. She has not been deemed to be radiation candidate. She is certainly not a candidate for chemotherapy. Only palliative measure that could be pursued is a pleurex catheter which the patient refuses. Her PS is 4 and I am not sure if her family can care for her at home. ECF with hospice services would be ideal but patient refuses hospice. Case manager and palliative care team working on finding a good discharge disposition.   Please call us with any questions or concerns.   Visit Diagnosis 1. Generalized weakness   2. Peripheral edema  3. Hyponatremia   4. Elevated troponin   5. Renal insufficiency   6. Cough   7. Pleural effusion on right   8. S/P thoracentesis   9. Pleural effusion   10. Lung cancer ([place])   11. Pleural effusion, not elsewhere classified   12. Dizzy      Dr. Randa Evens, MD, MPH [place] at [facility] Pager- [phone] 04/03/17

## 2017-04-04 NOTE — Progress Notes (Signed)
[employer] at [place] NAME: Donna Lane    MR#:  [id number]  DATE OF BIRTH:  September 05, 1925  SUBJECTIVE:  CHIEF COMPLAINT:   Chief Complaint  Patient presents with  . Weakness  . Dizziness  . Leg Swelling   -Patient with stage IV lung cancer, opted for no treatment at this time. Refused Pleurx catheter placement for comfort. Refuses hospice services -Awaiting for palliative care to discuss with daughter about disposition plan  REVIEW OF SYSTEMS:  Review of Systems  Constitutional: Negative for chills and fever.  HENT: Negative for congestion, ear discharge, hearing loss and nosebleeds.   Eyes: Negative for blurred vision and double vision.  Respiratory: Negative for cough, shortness of breath and wheezing.   Cardiovascular: Negative for chest pain, palpitations and leg swelling.  Gastrointestinal: Negative for abdominal pain, constipation, diarrhea, nausea and vomiting.  Genitourinary: Negative for dysuria.  Neurological: Negative for dizziness, seizures and headaches.  Psychiatric/Behavioral: Positive for depression.    DRUG ALLERGIES:   Allergies  Allergen Reactions  . Nsaids Other (See Comments)    Would avoid due to CKD.... Pt states she not allergic    VITALS:  Blood pressure (!) 107/45, pulse 98, temperature 98.5 F (36.9 C), temperature source Oral, resp. rate 20, height '5\' 2"'$  (1.575 m), weight 40.4 kg (89 lb), SpO2 100 %.  PHYSICAL EXAMINATION:  Physical Exam  GENERAL:  83 y.o.-year-old patient lying in the bed with no acute distress.  EYES: Pupils equal, round, reactive to light and accommodation. No scleral icterus. Extraocular muscles intact.  HEENT: Head atraumatic, normocephalic. Oropharynx and nasopharynx clear.  NECK:  Supple, no jugular venous distention. No thyroid enlargement, no tenderness.  LUNGS: Normal breath sounds bilaterally, no wheezing, rales,rhonchi or crepitation. No use of accessory muscles  of respiration. Decreased bibasilar breath sounds CARDIOVASCULAR: S1, S2 normal. No  rubs, or gallops. 2/6 systolic murmur present ABDOMEN: Soft, nontender, nondistended. Bowel sounds present. No organomegaly or mass.  EXTREMITIES: No  cyanosis, or clubbing. Trace pedal edema noted. NEUROLOGIC: Cranial nerves II through XII are intact. Muscle strength 5/5 in all extremities. Sensation intact. Gait not checked. Global weakness noted. PSYCHIATRIC: The patient is alert and oriented x 3. Flat affect SKIN: No obvious rash, lesion, or ulcer.    LABORATORY PANEL:   CBC  Recent Labs Lab 03/31/17 0303  WBC 27.0*  HGB 12.0  HCT 36.7  PLT 186   ------------------------------------------------------------------------------------------------------------------  Chemistries   Recent Labs Lab 04/04/17 0530  NA 138  K 3.9  CL 89*  CO2 40*  GLUCOSE 95  BUN 84*  CREATININE 1.02*  CALCIUM 8.9   ------------------------------------------------------------------------------------------------------------------  Cardiac Enzymes No results for input(s): TROPONINI in the last 168 hours. ------------------------------------------------------------------------------------------------------------------  RADIOLOGY:  No results found.  EKG:   Orders placed or performed during the hospital encounter of 03/21/17  . ED EKG  . ED EKG  . EKG 12-Lead  . EKG 12-Lead  . ED EKG  . ED EKG    ASSESSMENT AND PLAN:   83 year old female with past medical history significant for CAD status post CABG, diabetes, hypertension, peripheral vascular disease, atrial fibrillation, diastolic heart failure, CK D stage III, COPD presents to Hospital and noted to have a large lung mass with mediastinal invasion and SVC syndrome.  #1 stage IV lung cancer-patient is noted to have a large right periareolar mass with possible mediastinal and cardiac involvement. Also has large right-sided pleural effusion. Refused to  have further biopsy  or further workup done. -Appreciate cardiothoracic surgeon and oncology consults. -Patient refused Pleurx catheter placement for comfort. She is not a candidate for radiation or chemotherapy at this time. -Palliative care is consulted. Patient is refusing hospice but does not want any treatment as well. -Discharge disposition being discussed at this time. -Goal is to keep her comfortable.  #2 fibrillation-patient is on amiodarone. Rate is controlled Heart rate was initially elevated secondary to underlying lung mass compressing on the mediastinum.  #3 hypothyroidism-continue Synthroid  #4 COPD-continue steroids for now, change to oral prednisone.  #5 diabetes mellitus-while on steroids. Continue Levemir and sliding scale insulin.  #6 DVT prophylaxis-start Lovenox.     All the records are reviewed and case discussed with Care Management/Social Workerr. Management plans discussed with the patient, family and they are in agreement.  CODE STATUS: DNR  TOTAL TIME TAKING CARE OF THIS PATIENT: 37 minutes.   POSSIBLE D/C IN 1-2 DAYS, DEPENDING ON CLINICAL CONDITION.   Hensley Aziz M.D on 04/04/2017 at 3:36 PM  Between 7am to 6pm - Pager - [phone]  After 6pm go to [email or web address] - password [email or web address] [facility]  Office  [phone]  CC: Primary care physician; [name], MD

## 2017-04-05 LAB — BASIC METABOLIC PANEL
ANION GAP: 9 (ref 5–15)
BUN: 91 mg/dL — ABNORMAL HIGH (ref 6–20)
CALCIUM: 8.6 mg/dL — AB (ref 8.9–10.3)
CO2: 38 mmol/L — ABNORMAL HIGH (ref 22–32)
Chloride: 90 mmol/L — ABNORMAL LOW (ref 101–111)
Creatinine, Ser: 1.01 mg/dL — ABNORMAL HIGH (ref 0.44–1.00)
GFR, EST AFRICAN AMERICAN: 55 mL/min — AB (ref >60)
GFR, EST NON AFRICAN AMERICAN: 48 mL/min — AB (ref >60)
Glucose, Bld: 68 mg/dL (ref 65–99)
Potassium: 3.8 mmol/L (ref 3.5–5.1)
Sodium: 137 mmol/L (ref 135–145)

## 2017-04-05 LAB — CBC
HEMATOCRIT: 33.7 % — AB (ref 35.0–47.0)
Hemoglobin: 10.9 g/dL — ABNORMAL LOW (ref 12.0–16.0)
MCH: 27.2 pg (ref 26.0–34.0)
MCHC: 32.5 g/dL (ref 32.0–36.0)
MCV: 83.9 fL (ref 80.0–100.0)
PLATELETS: 181 10*3/uL (ref 150–440)
RBC: 4.02 MIL/uL (ref 3.80–5.20)
RDW: 16.7 % — AB (ref 11.5–14.5)
WBC: 24.7 10*3/uL — AB (ref 3.6–11.0)

## 2017-04-05 LAB — GLUCOSE, CAPILLARY
Glucose-Capillary: 79 mg/dL (ref 65–99)
Glucose-Capillary: 208 mg/dL — ABNORMAL HIGH (ref 65–99)
Glucose-Capillary: 253 mg/dL — ABNORMAL HIGH (ref 65–99)
Glucose-Capillary: 280 mg/dL — ABNORMAL HIGH (ref 65–99)

## 2017-04-05 MED ORDER — SODIUM CHLORIDE 0.9% FLUSH
3.0000 mL | Freq: Two times a day (BID) | INTRAVENOUS | Status: DC
  Administered 2017-04-05 – 2017-04-07 (×6): 3 mL via INTRAVENOUS

## 2017-04-05 NOTE — Care Management Note (Signed)
Case Management Note  Patient Details  Name: Donna Lane MRN: [id number] Date of Birth: [birth year]  Subjective/Objective:    Left message on daughter Cheryl's phone requesting a call back to coordinate discharge planning.                 Action/Plan:   Expected Discharge Date:                  Expected Discharge Plan:     In-House Referral:     Discharge planning Services     Post Acute Care Choice:    Choice offered to:     DME Arranged:    DME Agency:     HH Arranged:    HH Agency:     Status of Service:     If discussed at [employer] of Stay Meetings, dates discussed:    Additional Comments:  Bertie Simien A, RN 04/05/2017, 4:53 PM

## 2017-04-05 NOTE — Clinical Social Work Note (Signed)
RNCM alerted CSW that patient's family is choosing hospice at home. CSW will con't to follow should needs change.  Santiago Bumpers, MSW, Latanya Presser [phone]

## 2017-04-05 NOTE — Progress Notes (Signed)
Generalized weakness; pt slept at long intervals. Weeping small amount serous fluid from both arms. Drinks better; ate part of magic cup, drank protein supplement. Denies pain/SOB currently. Refused mouthcare and repositioning every 2 hours. Pink foams placed on both heels for protection. Pt states she does not want to go to hospice home; states her family will care for her.

## 2017-04-05 NOTE — Progress Notes (Signed)
PT Cancellation Note  Patient Details Name: Donna Lane MRN: [id number] DOB: [birth year]   Cancelled Treatment:    Reason Eval/Treat Not Completed: Other (comment). Reviewed chart, pt is refusing treatment and pt with poor prognosis. Discussions made for home with hospice vs hospice home. PT not warranted at this time as pt is not making progress and is unable to perform OOB mobility. Will dc current orders. Please re-order if needs change.   Karolyna Bianchini 04/05/2017, 8:21 AM  Greggory Stallion, PT, DPT [phone]

## 2017-04-05 NOTE — Progress Notes (Signed)
[place] at [place] NAME: Donna Lane    MR#:  [id number]  DATE OF BIRTH:  12/24/1924  SUBJECTIVE:  CHIEF COMPLAINT:   Chief Complaint  Patient presents with  . Weakness  . Dizziness  . Leg Swelling   -Patient with stage IV lung cancer, opted for no treatment at this time. Refused Pleurx catheter placement for comfort. Refuses hospice services -Awaiting for disposition plan and arranging help at home  REVIEW OF SYSTEMS:  Review of Systems  Constitutional: Negative for chills and fever.  HENT: Negative for congestion, ear discharge, hearing loss and nosebleeds.   Eyes: Negative for blurred vision and double vision.  Respiratory: Negative for cough, shortness of breath and wheezing.   Cardiovascular: Negative for chest pain, palpitations and leg swelling.  Gastrointestinal: Negative for abdominal pain, constipation, diarrhea, nausea and vomiting.  Genitourinary: Negative for dysuria.  Neurological: Negative for dizziness, seizures and headaches.  Psychiatric/Behavioral: Positive for depression.    DRUG ALLERGIES:   Allergies  Allergen Reactions  . Nsaids Other (See Comments)    Would avoid due to CKD.... Pt states she not allergic    VITALS:  Blood pressure (!) 100/42, pulse 97, temperature 97.6 F (36.4 C), temperature source Oral, resp. rate 20, height '5\' 2"'$  (1.575 m), weight 37.6 kg (83 lb), SpO2 99 %.  PHYSICAL EXAMINATION:  Physical Exam  GENERAL:  83 y.o.-year-old patient lying in the bed with no acute distress.  EYES: Pupils equal, round, reactive to light and accommodation. No scleral icterus. Extraocular muscles intact.  HEENT: Head atraumatic, normocephalic. Oropharynx and nasopharynx clear.  NECK:  Supple, no jugular venous distention. No thyroid enlargement, no tenderness.  LUNGS: Normal breath sounds bilaterally, no wheezing, rales,rhonchi or crepitation. No use of accessory muscles of respiration.  Decreased bibasilar breath sounds CARDIOVASCULAR: S1, S2 normal. No  rubs, or gallops. 2/6 systolic murmur present ABDOMEN: Soft, nontender, nondistended. Bowel sounds present. No organomegaly or mass.  EXTREMITIES: No  cyanosis, or clubbing. Trace pedal edema noted. NEUROLOGIC: Cranial nerves II through XII are intact. Muscle strength 5/5 in all extremities. Sensation intact. Gait not checked. Global weakness noted. PSYCHIATRIC: The patient is alert and oriented x 3. Flat affect SKIN: No obvious rash, lesion, or ulcer.    LABORATORY PANEL:   CBC  Recent Labs Lab 04/05/17 0641  WBC 24.7*  HGB 10.9*  HCT 33.7*  PLT 181   ------------------------------------------------------------------------------------------------------------------  Chemistries   Recent Labs Lab 04/05/17 0641  NA 137  K 3.8  CL 90*  CO2 38*  GLUCOSE 68  BUN 91*  CREATININE 1.01*  CALCIUM 8.6*   ------------------------------------------------------------------------------------------------------------------  Cardiac Enzymes No results for input(s): TROPONINI in the last 168 hours. ------------------------------------------------------------------------------------------------------------------  RADIOLOGY:  No results found.  EKG:   Orders placed or performed during the hospital encounter of 03/21/17  . ED EKG  . ED EKG  . EKG 12-Lead  . EKG 12-Lead  . ED EKG  . ED EKG    ASSESSMENT AND PLAN:   83 year old female with past medical history significant for CAD status post CABG, diabetes, hypertension, peripheral vascular disease, atrial fibrillation, diastolic heart failure, CK D stage III, COPD presents to Hospital and noted to have a large lung mass with mediastinal invasion and SVC syndrome.  #1 stage IV lung cancer-patient is noted to have a large right perihilar mass with possible mediastinal and cardiac involvement. Also has large right-sided pleural effusion. Refused to have further  biopsy or further  workup done. -Appreciate cardiothoracic surgeon and oncology consults. -Patient refused Pleurx catheter placement for comfort. She is not a candidate for radiation or chemotherapy at this time. -Palliative care is consulted. Patient is refusing hospice but does not want any treatment as well. -Discharge disposition being discussed at this time. -Goal is to keep her comfortable.  #2 fibrillation-patient is on amiodarone. Rate is controlled Heart rate was initially elevated secondary to underlying lung mass compressing on the mediastinum.  #3 hypothyroidism-continue Synthroid  #4 COPD-continue steroids for now, change to oral prednisone.  #5 diabetes mellitus-Sugars have been low normal at this time. Discontinue Levemir. Only continue sliding scale insulin  #6 DVT prophylaxis-Lovenox.   Discussed with daughter [name] today    All the records are reviewed and case discussed with Care Management/Social Workerr. Management plans discussed with the patient, family and they are in agreement.  CODE STATUS: DNR  TOTAL TIME TAKING CARE OF THIS PATIENT: 29 minutes.   POSSIBLE D/C TOMORROW OR MONDAY, DEPENDING ON CLINICAL CONDITION.   Gladstone Lighter M.D on 04/05/2017 at 12:10 PM  Between 7am to 6pm - Pager - [phone]  After 6pm go to [email or web address] - password [email or web address] [facility]  Office  [phone]  CC: Primary care physician; [name], MD

## 2017-04-06 LAB — GLUCOSE, CAPILLARY
Glucose-Capillary: 212 mg/dL — ABNORMAL HIGH (ref 65–99)
Glucose-Capillary: 231 mg/dL — ABNORMAL HIGH (ref 65–99)
Glucose-Capillary: 167 mg/dL — ABNORMAL HIGH (ref 65–99)

## 2017-04-06 LAB — GLUCOSE, POCT (MANUAL RESULT ENTRY): POC Glucose: 243 mg/dl — AB (ref 70–99)

## 2017-04-06 NOTE — Progress Notes (Signed)
[place] at [place] NAME: Donna Lane    MR#:  [id number]  DATE OF BIRTH:  11/06/1925  SUBJECTIVE:  CHIEF COMPLAINT:   Chief Complaint  Patient presents with  . Weakness  . Dizziness  . Leg Swelling   -Patient with stage IV lung cancer, opted for no treatment at this time. Refused Pleurx catheter placement for comfort.  - family agreed for hospice Services at home. She denies any complaints at this time.  REVIEW OF SYSTEMS:  Review of Systems  Constitutional: Negative for chills and fever.  HENT: Negative for congestion, ear discharge, hearing loss and nosebleeds.   Eyes: Negative for blurred vision and double vision.  Respiratory: Negative for cough, shortness of breath and wheezing.   Cardiovascular: Negative for chest pain, palpitations and leg swelling.  Gastrointestinal: Negative for abdominal pain, constipation, diarrhea, nausea and vomiting.  Genitourinary: Negative for dysuria.  Neurological: Negative for dizziness, seizures and headaches.  Psychiatric/Behavioral: Positive for depression.    DRUG ALLERGIES:   Allergies  Allergen Reactions  . Nsaids Other (See Comments)    Would avoid due to CKD.... Pt states she not allergic    VITALS:  Blood pressure (!) 102/27, pulse (!) 103, temperature 97.8 F (36.6 C), temperature source Oral, resp. rate 20, height '5\' 2"'$  (1.575 m), weight 38.6 kg (85 lb), SpO2 100 %.  PHYSICAL EXAMINATION:  Physical Exam  GENERAL:  83 y.o.-year-old patient lying in the bed with no acute distress.  EYES: Pupils equal, round, reactive to light and accommodation. No scleral icterus. Extraocular muscles intact.  HEENT: Head atraumatic, normocephalic. Oropharynx and nasopharynx clear.  NECK:  Supple, no jugular venous distention. No thyroid enlargement, no tenderness.  LUNGS: Normal breath sounds bilaterally, no wheezing, rales,rhonchi or crepitation. No use of accessory muscles of  respiration. Decreased bibasilar breath sounds CARDIOVASCULAR: S1, S2 normal. No  rubs, or gallops. 2/6 systolic murmur present ABDOMEN: Soft, nontender, nondistended. Bowel sounds present. No organomegaly or mass.  EXTREMITIES: No  cyanosis, or clubbing. Trace pedal edema noted. NEUROLOGIC: Cranial nerves II through XII are intact. Muscle strength 5/5 in all extremities. Sensation intact. Gait not checked. Global weakness noted. PSYCHIATRIC: The patient is alert and oriented x 3.  SKIN: No obvious rash, lesion, or ulcer.    LABORATORY PANEL:   CBC  Recent Labs Lab 04/05/17 0641  WBC 24.7*  HGB 10.9*  HCT 33.7*  PLT 181   ------------------------------------------------------------------------------------------------------------------  Chemistries   Recent Labs Lab 04/05/17 0641  NA 137  K 3.8  CL 90*  CO2 38*  GLUCOSE 68  BUN 91*  CREATININE 1.01*  CALCIUM 8.6*   ------------------------------------------------------------------------------------------------------------------  Cardiac Enzymes No results for input(s): TROPONINI in the last 168 hours. ------------------------------------------------------------------------------------------------------------------  RADIOLOGY:  No results found.  EKG:   Orders placed or performed during the hospital encounter of 03/21/17  . ED EKG  . ED EKG  . EKG 12-Lead  . EKG 12-Lead  . ED EKG  . ED EKG    ASSESSMENT AND PLAN:   83 year old female with past medical history significant for CAD status post CABG, diabetes, hypertension, peripheral vascular disease, atrial fibrillation, diastolic heart failure, CK D stage III, COPD presents to Hospital and noted to have a large lung mass with mediastinal invasion and SVC syndrome.  #1 stage IV lung cancer-patient is noted to have a large right perihilar mass with possible mediastinal and cardiac involvement. Also has large right-sided pleural effusion. Refused to have further  biopsy or further workup done. -Appreciate cardiothoracic surgeon and oncology consults. -Patient refused Pleurx catheter placement for comfort. She is not a candidate for radiation or chemotherapy at this time. -Palliative care is consulted.  - Possible discharge tomorrow-home with hospice services  #2 fibrillation-patient is on amiodarone. Rate is controlled Heart rate was initially elevated secondary to underlying lung mass compressing on the mediastinum.  #3 hypothyroidism-continue Synthroid  #4 COPD-continue steroids for now, changed to oral prednisone.  #5 diabetes mellitus-Sugars have been low normal at this time. Discontinue Levemir. Only continue sliding scale insulin  #6 DVT prophylaxis-Lovenox.   Discussed with daughter Donna Lane Discharge tomorrow home with hospice services    All the records are reviewed and case discussed with Care Management/Social Workerr. Management plans discussed with the patient, family and they are in agreement.  CODE STATUS: DNR  TOTAL TIME TAKING CARE OF THIS PATIENT: 29 minutes.   POSSIBLE D/C TOMORROW, DEPENDING ON CLINICAL CONDITION.   Gladstone Lighter M.D on 04/06/2017 at 11:32 AM  Between 7am to 6pm - Pager - [phone]  After 6pm go to [email or web address] - password [email or web address] [facility]  Office  [phone]  CC: Primary care physician; Donna Stain, MD

## 2017-04-06 NOTE — Plan of Care (Signed)
Problem: Skin Integrity: Goal: Risk for impaired skin integrity will decrease Outcome: Not Progressing Pt refuses every 2 hour turns/ repositioned with incontinence care. Limited movements/shifts in bed. Refuses mouth care. Continued pink foams on heels/floated heels when agreeable.   Problem: Activity: Goal: Risk for activity intolerance will decrease Outcome: Not Progressing Continues at bedrest with minimal activity despite encouragement to move/be as active as able.   Problem: Fluid Volume: Goal: Ability to maintain a balanced intake and output will improve Outcome: Not Progressing Drinks protein supplements, only few bites of meals, does drink water and other beverage at times.

## 2017-04-06 NOTE — Care Management Note (Signed)
Case Management Note  Patient Details  Name: Donna Lane MRN: [id number] Date of Birth: 1925-01-07  Subjective/Objective:    Left phone message for daughter to please call this writer today to discuss discharge planning. Is room at home ready for a hospital bed?  Home Health services or Palliative Care?              Do   Action/Plan:   Expected Discharge Date:                  Expected Discharge Plan:     In-House Referral:     Discharge planning Services     Post Acute Care Choice:    Choice offered to:     DME Arranged:    DME Agency:     HH Arranged:    HH Agency:     Status of Service:     If discussed at [employer] of Stay Meetings, dates discussed:    Additional Comments:  Yuta Cipollone A, RN 04/06/2017, 8:59 AM

## 2017-04-06 NOTE — Care Management Note (Addendum)
Case Management Note  Patient Details  Name: Donna Lane MRN: [id number] Date of Birth: 09-19-1925  Subjective/Objective:      Discussed discharge planning with daughter [name] [phone], who appears to be the decision maker for the family. Today [name] reports that the family wants Hospice in home care for [name]. Cheryl requested [facility] [place] . [name] reports that they are having a family meeting this afternoon at which time the family will also begin clearing out a space for a hospital bed in the family home. This Probation officer faxed and called a referral to Lorelle Formosa at [facility] [place]. Mariann Laster stated that they would accept [name] as a patient [place]. Mariann Laster also stated that Hospice would arrange for the delivery of a hospital bed. Dr Tressia Miners was updated.               Action/Plan:   Expected Discharge Date:   04/07/17 after a hospital bed is delivered and set up in the home.              Expected Discharge Plan:   Home with [facility] services.  In-House Referral:     Discharge planning Services     Post Acute Care Choice:   [facility]. Choice offered to:   daughter [name]  DME Arranged:   Hospice to arrange delivery of a semi-automatic hospital bed to [name]'s home. DME Agency:   [facility] chosen by Hospice of A/C  HH Arranged:   [facility] in home services as needed [place]:   [facility] and [place].  Status of Service:   Accepted as a patient by [facility] [place]  If discussed at Long Length of Stay Meetings, dates discussed:    Additional Comments:  Chrishawna Farina A, RN 04/06/2017, 9:50 AM

## 2017-04-06 NOTE — Progress Notes (Signed)
Pt reports she has plans to return home tomorrow with family care. Reports she generally doesn't feel well; denies specific co's such as pain/SOB. Cough producing small amount clear, white sputum. No respiratory distress. Continued limited appetite with protein supplements taken, few bites of other food, drinks water and other beverages. Pt limits turning/repositioning with education done on pressure ulcer care/prevention.

## 2017-04-07 ENCOUNTER — Ambulatory Visit: Payer: Medicare Other

## 2017-04-07 ENCOUNTER — Telehealth: Payer: Self-pay | Admitting: *Deleted

## 2017-04-07 ENCOUNTER — Ambulatory Visit: Payer: Medicare Other | Admitting: Family

## 2017-04-07 LAB — GLUCOSE, CAPILLARY
Glucose-Capillary: 243 mg/dL — ABNORMAL HIGH (ref 65–99)
Glucose-Capillary: 193 mg/dL — ABNORMAL HIGH (ref 65–99)
Glucose-Capillary: 247 mg/dL — ABNORMAL HIGH (ref 65–99)
Glucose-Capillary: 207 mg/dL — ABNORMAL HIGH (ref 65–99)
Glucose-Capillary: 160 mg/dL — ABNORMAL HIGH (ref 65–99)

## 2017-04-07 MED ORDER — MORPHINE SULFATE (CONCENTRATE) 20 MG/ML PO SOLN: 10.0000 mg | ORAL | 0 refills | Status: AC | PRN

## 2017-04-07 MED ORDER — PREDNISONE 10 MG (21) PO TBPK: ORAL_TABLET | ORAL | 0 refills | Status: AC

## 2017-04-07 MED ORDER — LORAZEPAM 0.5 MG PO TABS: 0.5000 mg | ORAL_TABLET | Freq: Three times a day (TID) | ORAL | 0 refills | Status: AC

## 2017-04-07 NOTE — Telephone Encounter (Signed)
Donna Lane with Hospice left a voicemail stating that patient is being discharged today from [facility].  Donna Lane stated that [facility] is requesting services by hospice when patient leaves the hospital. . Donna Lane stated that they will be faxing over orders for Dr. Damita Dunnings to sign. Donna Lane would like a call back giving verbal order for their services.

## 2017-04-07 NOTE — Care Management Important Message (Signed)
Important Message  Patient Details  Name: Donna Lane MRN: [id number] Date of Birth: 23-Feb-1925   Medicare Important Message Given:  Yes    Shelbie Ammons, RN 04/07/2017, 12:45 PM

## 2017-04-07 NOTE — Progress Notes (Signed)
[facility] at [place] NAME: Donna Lane    MR#:  [id number]  DATE OF BIRTH:  01/16/1925  SUBJECTIVE:  CHIEF COMPLAINT:   Chief Complaint  Patient presents with  . Weakness  . Dizziness  . Leg Swelling   -Patient with stage IV lung cancer, opted for no treatment at this time. Refused Pleurx catheter placement for comfort.  - family agreed for hospice Services at home. She denies any complaints at this time. -Waiting to set up hospital bed and other services at home  REVIEW OF SYSTEMS:  Review of Systems  Constitutional: Negative for chills and fever.  HENT: Negative for congestion, ear discharge, hearing loss and nosebleeds.   Eyes: Negative for blurred vision and double vision.  Respiratory: Negative for cough, shortness of breath and wheezing.   Cardiovascular: Negative for chest pain, palpitations and leg swelling.  Gastrointestinal: Negative for abdominal pain, constipation, diarrhea, nausea and vomiting.  Genitourinary: Negative for dysuria.  Neurological: Negative for dizziness, seizures and headaches.  Psychiatric/Behavioral: Positive for depression.    DRUG ALLERGIES:   Allergies  Allergen Reactions  . Nsaids Other (See Comments)    Would avoid due to CKD.... Pt states she not allergic    VITALS:  Blood pressure (!) 100/43, pulse 80, temperature 98.1 F (36.7 C), temperature source Oral, resp. rate 20, height '5\' 2"'$  (1.575 m), weight 38.6 kg (85 lb), SpO2 97 %.  PHYSICAL EXAMINATION:  Physical Exam  GENERAL:  83 y.o.-year-old patient lying in the bed with no acute distress. Appears more irritable today EYES: Pupils equal, round, reactive to light and accommodation. No scleral icterus. Extraocular muscles intact.  HEENT: Head atraumatic, normocephalic. Oropharynx and nasopharynx clear.  NECK:  Supple, no jugular venous distention. No thyroid enlargement, no tenderness.  LUNGS: Normal breath sounds bilaterally,  no wheezing, rales,rhonchi or crepitation. No use of accessory muscles of respiration. Decreased bibasilar breath sounds CARDIOVASCULAR: S1, S2 normal. No  rubs, or gallops. 2/6 systolic murmur present ABDOMEN: Soft, nontender, nondistended. Bowel sounds present. No organomegaly or mass.  EXTREMITIES: No  cyanosis, or clubbing. Trace pedal edema noted. NEUROLOGIC: Cranial nerves II through XII are intact. Muscle strength 5/5 in all extremities. Sensation intact. Gait not checked. Global weakness noted. PSYCHIATRIC: The patient is alert and oriented x 3.  SKIN: No obvious rash, lesion, or ulcer.    LABORATORY PANEL:   CBC  Recent Labs Lab 04/05/17 0641  WBC 24.7*  HGB 10.9*  HCT 33.7*  PLT 181   ------------------------------------------------------------------------------------------------------------------  Chemistries   Recent Labs Lab 04/05/17 0641  NA 137  K 3.8  CL 90*  CO2 38*  GLUCOSE 68  BUN 91*  CREATININE 1.01*  CALCIUM 8.6*   ------------------------------------------------------------------------------------------------------------------  Cardiac Enzymes No results for input(s): TROPONINI in the last 168 hours. ------------------------------------------------------------------------------------------------------------------  RADIOLOGY:  No results found.  EKG:   Orders placed or performed during the hospital encounter of 03/21/17  . ED EKG  . ED EKG  . EKG 12-Lead  . EKG 12-Lead  . ED EKG  . ED EKG    ASSESSMENT AND PLAN:   83 year old female with past medical history significant for CAD status post CABG, diabetes, hypertension, peripheral vascular disease, atrial fibrillation, diastolic heart failure, CK D stage III, COPD presents to Hospital and noted to have a large lung mass with mediastinal invasion and SVC syndrome.  #1 stage IV lung cancer-patient is noted to have a large right perihilar mass with possible mediastinal  and cardiac  involvement. Also has large right-sided pleural effusion. Refused to have further biopsy or further workup done. -Appreciate cardiothoracic surgeon and oncology consults. -Patient refused Pleurx catheter placement for comfort. She is not a candidate for radiation or chemotherapy at this time. -Palliative care is consulted.  - Possible discharge tomorrow-home with hospice services  #2 atrial fibrillation-patient is on amiodarone. Rate is controlled Heart rate was initially elevated secondary to underlying lung mass compressing on the mediastinum. - since going with hospice- discontinue anti coagulation - cardizem on hold due to low BP  #3 hypothyroidism-continue Synthroid  #4 COPD- stable. Prednisone taper at discharge  #5 diabetes mellitus-Sugars have been low normal at this time. Discontinue Levemir. Only continue sliding scale insulin  #6 DVT prophylaxis-Lovenox.   Discussed with daughter [name] Discharge tomorrow home with hospice services    All the records are reviewed and case discussed with Care Management/Social Workerr. Management plans discussed with the patient, family and they are in agreement.  CODE STATUS: DNR  TOTAL TIME TAKING CARE OF THIS PATIENT: 29 minutes.   POSSIBLE D/C TOMORROW, DEPENDING ON CLINICAL CONDITION.   Gladstone Lighter M.D on 04/07/2017 at 1:37 PM  Between 7am to 6pm - Pager - [phone]  After 6pm go to [email or web address] - password [email or web address] [place] [facility]  Office  [phone]  CC: Primary care physician; [name], MD

## 2017-04-07 NOTE — Clinical Social Work Note (Signed)
Pt is ready for discharge today and will return home with hospice services following. RNCM is following for discharge planning needs. CSW is signing off as no further needs identified.   Darden Dates, MSW, LCSW  -Clinical Social Worker  -

## 2017-04-07 NOTE — Progress Notes (Signed)
Inpatient Diabetes Program Recommendations  AACE/ADA: New Consensus Statement on Inpatient Glycemic Control (2015)  Target Ranges:  Prepandial:   less than 140 mg/dL      Peak postprandial:   less than 180 mg/dL (1-2 hours)      Critically ill patients:  140 - 180 mg/dL  Results for Donna Lane, Donna Lane (MRN [id number]) as of 04/07/2017 10:23  Ref. Range 04/06/2017 07:35 04/06/2017 12:00 04/06/2017 16:38 04/06/2017 21:09 04/07/2017 07:43  Glucose-Capillary Latest Ref Range: 65 - 99 mg/dL 212 (H) 243 (H) 231 (H) 167 (H) 193 (H)    Review of Glycemic Control  Current orders for Inpatient glycemic control: Novolog 0-15 units TID with meals, Novolog 0-5 units QHS  Inpatient Diabetes Program Recommendations: Insulin - Basal: If patient is not discharged today, please consider ordering Levemir 4 units Q24H.  Thanks, Barnie Alderman, RN, MSN, CDE Diabetes Coordinator Inpatient Diabetes Program [phone] (Team Pager from 8am to 5pm)

## 2017-04-07 NOTE — Telephone Encounter (Signed)
Left detailed message on voicemail of Denise with Hospice for verbal orders for Hospice with faxed orders to follow.

## 2017-04-07 NOTE — Consult Note (Signed)
New referral for hospice at home pending discharge from [facility].  Patient is an 83yo female who was admitted to [facility] on 4.6.2018 with right pleural effusion in the setting of stage IV lung cancer. Post oncology consult, she is not a candidate for chemotherapy or radiation.  Her family plans to take her home on full comfort measures and request hospice services.  The family does not want the husband or patient to know they are under hospice care.  They want [place] to take off our name badges upon entering the home.  I discussed this with the daughter [name]- and notified we could not take off our name tags and that we would try to work with them and be sensitive to their wishes, but could not promise that we would not use the word hospice when we provide care.  Patient has history of CKDIII, COPD, CAD, CABG x4, DM, hyperlipidemia, HTN, atrial fib, chronic diastolic heart failure, PVD and spinal stenosis. Patient will need hospital bed and oxygen concentrator and portable tank.  Supplies needed:  Medium gloves, chux, medium diaper.  Discussed hospice services with [name].  Patient is reluctant to "hospice" as she thinks hospice "killed her baby sister" according to palliative medicine note.  Updated information faxed to referral intake.  Patient for discharge today, however [name] states they are not ready for Mrs. Filsinger to come home today.  [name] states had a nurse there this morning, but patient never showed up.  Discussed how hospice admission works and that patient would need to be discharged from the hospital first and everything to be coordinated for a smooth transition home.  She states they are too overwhelmed to take her home today and request discharge from [facility] tomorrow.  Notified [name] that I would pass along to the team at [facility].  Report to Jeannie Fend on families request.  Family is awaiting delivery of oxygen and hospital bed.  Will continue to follow.  Dimas Aguas RN

## 2017-04-07 NOTE — Care Management (Signed)
Spoke with Doreatha Lew RN representative for [facility]. The hospital bed is being delivered to the home today. Discharge to home today per Dr. Tressia Miners. Transportation will be arranged per [facility] Shelbie Ammons RN MSN [facility] [phone]

## 2017-04-07 NOTE — Telephone Encounter (Signed)
Please give the order.  Thanks.   

## 2017-04-08 ENCOUNTER — Ambulatory Visit: Payer: Medicare Other

## 2017-04-08 LAB — GLUCOSE, CAPILLARY: Glucose-Capillary: 215 mg/dL — ABNORMAL HIGH (ref 65–99)

## 2017-04-08 NOTE — Plan of Care (Signed)
Pt is d/ced home with hospice.  Spoke to daughter and they are ready to receive her.  Nurse is at the home now to take over.  Discussed with daughter how pt takes meds in applesauce, icecream or yogurt - trying to change options. Also told her I'd write down dressing orders for wound on her sacrum.  And told her about rotating her off her back and propping with pillows.  Arms will need to be wrapped in chux pads and changed frequently because of weeping.  EMS has been called for transport.

## 2017-04-08 NOTE — Discharge Summary (Signed)
[name] NAME: Donna Lane    MR#:  [id number]  DATE OF BIRTH:  1925/10/06  DATE OF ADMISSION:  03/21/2017   ADMITTING PHYSICIAN: Harrie Foreman, MD  DATE OF DISCHARGE: 04/08/2017  PRIMARY CARE PHYSICIAN: [name], MD   ADMISSION DIAGNOSIS:   Hyponatremia [E87.1] Peripheral edema [R60.9] Renal insufficiency [N28.9] Elevated troponin [R74.8] Generalized weakness [R53.1]  DISCHARGE DIAGNOSIS:   Active Problems:   Acute on chronic systolic CHF (congestive heart failure) (HCC)   Pressure injury of skin   Goals of care, counseling/discussion   Lung cancer ([place])   SECONDARY DIAGNOSIS:   Past Medical History:  Diagnosis Date  . Arrhythmia    atrial fibrillation  . Chronic diastolic heart failure ([place])   . CKD (chronic kidney disease), stage III   . COPD (chronic obstructive pulmonary disease) ([place])   . Coronary artery disease 2008   CABG x 4,   . Diabetes mellitus   . Diverticulosis    on colonoscopy 08/12/2006  . Hyperlipidemia   . Hypertension   . Melanoma ([place])    removed by derm   . PVD (peripheral vascular disease) ([place])   . Spinal stenosis     HOSPITAL COURSE:   83 year old female with past medical history significant for CAD status post CABG, diabetes, hypertension, peripheral vascular disease, atrial fibrillation, diastolic heart failure, CK D stage III, COPD presents to Hospital and noted to have a large lung mass with mediastinal invasion and SVC syndrome.  #1 stage IV lung cancer-patient is noted to have a large right perihilar mass with possible mediastinal and cardiac involvement. Also has large right-sided pleural effusion. Refused to have further biopsy or further workup done. -Appreciate cardiothoracic surgeon and oncology consults. -Patient refused Pleurx catheter placement for comfort. She is not a candidate for radiation or chemotherapy at this time. -Palliative care is  consulted.  - Possible discharge today -home with hospice services -Patient will benefit more from hospice facility instead of home, however she refuses to go to a hospice facility at this time.  #2 atrial fibrillation-patient is on amiodarone. Rate is controlled Heart rate was initially elevated secondary to underlying lung mass compressing on the mediastinum. - since going with hospice- discontinue anti coagulation  #3 hypothyroidism-continue Synthroid  #4 COPD- stable. Prednisone taper at discharge  #5 diabetes mellitus-Sugars have been low normal at this time. Discontinue Levemir. Only continue sliding scale insulin   Discussed with daughter [name] yesterday Discharge home with hospice services. Hospital bed and home oxygen will be delivered  DISCHARGE CONDITIONS:   Critical CONSULTS OBTAINED:   Treatment Team:  Epifanio Lesches, MD Sindy Guadeloupe, MD  DRUG ALLERGIES:   Allergies  Allergen Reactions  . Nsaids Other (See Comments)    Would avoid due to CKD.... Pt states she not allergic   DISCHARGE MEDICATIONS:   Allergies as of 04/08/2017      Reactions   Nsaids Other (See Comments)   Would avoid due to CKD.... Pt states she not allergic      Medication List    STOP taking these medications   apixaban 2.5 MG Tabs tablet Commonly known as:  ELIQUIS   diltiazem 240 MG 24 hr capsule Commonly known as:  CARDIZEM CD   insulin detemir 100 UNIT/ML injection Commonly known as:  LEVEMIR     TAKE these medications   acetaminophen 500 MG tablet Commonly known as:  TYLENOL Take 500 mg by mouth 2 (  two) times daily.   amiodarone 200 MG tablet Commonly known as:  PACERONE Take 1 tablet (200 mg total) by mouth 2 (two) times daily.   atorvastatin 10 MG tablet Commonly known as:  LIPITOR Take 1 tablet (10 mg total) by mouth daily at 6 PM.   CEPACOL SORE THROAT & COUGH 5-7.5 MG Lozg Generic drug:  Dextromethorphan-Benzocaine Use as directed 1 lozenge in  the mouth or throat daily as needed (cough/ sore throat).   cyanocobalamin 1000 MCG tablet Take 1,000 mcg by mouth daily.   diphenhydrAMINE 25 mg capsule Commonly known as:  BENADRYL Take 1 capsule (25 mg total) by mouth at bedtime as needed for sleep.   docusate sodium 100 MG capsule Commonly known as:  COLACE Take 1 capsule (100 mg total) by mouth 2 (two) times daily as needed for mild constipation.   furosemide 20 MG tablet Commonly known as:  LASIX Take 20 mg by mouth.   insulin aspart 100 UNIT/ML FlexPen Commonly known as:  NOVOLOG Inject 0-10 Units into the skin 3 (three) times daily with meals. If blood sugar is 200-250, give 2 units. 251-300, give 4 units. 301-350, give 6 units.  351-400, give 8 units. 401-450, give 10 units. If blood sugar is greater than 450, call MD.   ipratropium-albuterol 0.5-2.5 (3) MG/3ML Soln Commonly known as:  DUONEB Take 3 mLs by nebulization 4 (four) times daily.   levothyroxine 50 MCG tablet Commonly known as:  SYNTHROID, LEVOTHROID Take 50 mcg by mouth daily before breakfast.   LORazepam 0.5 MG tablet Commonly known as:  ATIVAN Take 1 tablet (0.5 mg total) by mouth every 8 (eight) hours.   morphine 20 MG/ML concentrated solution Commonly known as:  ROXANOL Take 0.5 mLs (10 mg total) by mouth every 3 (three) hours as needed for severe pain.   nitroGLYCERIN 0.4 MG SL tablet Commonly known as:  NITROSTAT Place 1 tablet (0.4 mg total) under the tongue every 5 (five) minutes as needed for chest pain.   nystatin 100000 UNIT/ML suspension Commonly known as:  MYCOSTATIN Take 10 mLs by mouth 4 (four) times daily.   ondansetron 4 MG tablet Commonly known as:  ZOFRAN Take 1 tablet (4 mg total) by mouth every 6 (six) hours as needed for nausea.   oxyCODONE 5 MG immediate release tablet Commonly known as:  Oxy IR/ROXICODONE Take 0.5 tablets (2.5 mg total) by mouth every 4 (four) hours as needed for moderate pain (or SOB).   OXYGEN Inhale  3 L into the lungs continuous.   predniSONE 10 MG (21) Tbpk tablet Commonly known as:  STERAPRED UNI-PAK 21 TAB 6 tabs PO x 1 day 5 tabs PO x 1 day 4 tabs PO x 1 day 3 tabs PO x 1 day 2 tabs PO x 1 day 1 tab PO x 1 day and stop   traZODone 50 MG tablet Commonly known as:  DESYREL Take 0.5 tablets (25 mg total) by mouth at bedtime.            Durable Medical Equipment        Start     Ordered   04/05/17 1210  For home use only DME Hospital bed  Once    Question Answer Comment  Patient has (list medical condition): metastatic lung cancer with back pain   The above medical condition requires: Patient requires the ability to reposition frequently   Head must be elevated greater than: 45 degrees   Bed type Semi-electric  04/05/17 1210       DISCHARGE INSTRUCTIONS:   1. Discharge home with hospice services  DIET:   Regular diet  ACTIVITY:   Activity as tolerated  OXYGEN:   Home Oxygen: Yes.    Oxygen Delivery: 2 liters/min via Patient connected to nasal cannula oxygen  DISCHARGE LOCATION:   home  With hospice services  If you experience worsening of your admission symptoms, develop shortness of breath, life threatening emergency, suicidal or homicidal thoughts you must seek medical attention immediately by calling 911 or calling your MD immediately  if symptoms less severe.  You Must read complete instructions/literature along with all the possible adverse reactions/side effects for all the Medicines you take and that have been prescribed to you. Take any new Medicines after you have completely understood and accpet all the possible adverse reactions/side effects.   Please note  You were cared for by a hospitalist during your hospital stay. If you have any questions about your discharge medications or the care you received while you were in the hospital after you are discharged, you can call the unit and asked to speak with the hospitalist on call if the  hospitalist that took care of you is not available. Once you are discharged, your primary care physician will handle any further medical issues. Please note that NO REFILLS for any discharge medications will be authorized once you are discharged, as it is imperative that you return to your primary care physician (or establish a relationship with a primary care physician if you do not have one) for your aftercare needs so that they can reassess your need for medications and monitor your lab values.    On the day of Discharge:  VITAL SIGNS:   Blood pressure (!) 99/43, pulse 95, temperature 98 F (36.7 C), temperature source Oral, resp. rate 18, height '5\' 2"'$  (1.575 m), weight 41.3 kg (91 lb), SpO2 100 %.  PHYSICAL EXAMINATION:    GENERAL:  83 y.o.-year-old patient lying in the bed with no acute distress. Appears very ill EYES: Pupils equal, round, reactive to light and accommodation. No scleral icterus. Extraocular muscles intact.  HEENT: Head atraumatic, normocephalic. Oropharynx and nasopharynx clear.  NECK:  Supple, no jugular venous distention. No thyroid enlargement, no tenderness.  LUNGS: Normal breath sounds bilaterally, no wheezing, rales,rhonchi or crepitation. No use of accessory muscles of respiration. Decreased bibasilar breath sounds CARDIOVASCULAR: S1, S2 normal. No  rubs, or gallops. 2/6 systolic murmur present ABDOMEN: Soft, nontender, nondistended. Bowel sounds present. No organomegaly or mass.  EXTREMITIES: No  cyanosis, or clubbing. Trace pedal edema noted. NEUROLOGIC: Cranial nerves II through XII are intact. Muscle strength 5/5 in all extremities. Sensation intact. Gait not checked. Global weakness noted. PSYCHIATRIC: The patient is alert and oriented x 3.  SKIN: No obvious rash, lesion, or ulcer.   DATA REVIEW:   CBC  Recent Labs Lab 04/05/17 0641  WBC 24.7*  HGB 10.9*  HCT 33.7*  PLT 181    Chemistries   Recent Labs Lab 04/05/17 0641  NA 137  K 3.8  CL  90*  CO2 38*  GLUCOSE 68  BUN 91*  CREATININE 1.01*  CALCIUM 8.6*     Microbiology Results  Results for orders placed or performed during the hospital encounter of 03/21/17  CULTURE, BLOOD (ROUTINE X 2) w Reflex to ID Panel     Status: None   Collection Time: 03/21/17 10:11 AM  Result Value Ref Range Status   Specimen Description BLOOD RIGHT  ARM  Final   Special Requests   Final    BOTTLES DRAWN AEROBIC AND ANAEROBIC Blood Culture adequate volume   Culture NO GROWTH 5 DAYS  Final   Report Status 03/26/2017 FINAL  Final  CULTURE, BLOOD (ROUTINE X 2) w Reflex to ID Panel     Status: None   Collection Time: 03/21/17 10:16 AM  Result Value Ref Range Status   Specimen Description BLOOD LEFT HAND  Final   Special Requests   Final    BOTTLES DRAWN AEROBIC AND ANAEROBIC Blood Culture adequate volume   Culture NO GROWTH 5 DAYS  Final   Report Status 03/26/2017 FINAL  Final  Body fluid culture     Status: None   Collection Time: 03/25/17  9:15 AM  Result Value Ref Range Status   Specimen Description PLEURAL  Final   Special Requests NONE  Final   Gram Stain   Final    RARE WBC PRESENT, PREDOMINANTLY MONONUCLEAR NO ORGANISMS SEEN    Culture   Final    NO GROWTH 3 DAYS Performed at [facility], [place] [address], [place], [place] [address]    Report Status 03/28/2017 FINAL  Final    RADIOLOGY:  No results found.   Management plans discussed with the patient, family and they are in agreement.  CODE STATUS:     Code Status Orders        Start     Ordered   03/21/17 0732  Do not attempt resuscitation (DNR)  Continuous    Question Answer Comment  In the event of cardiac or respiratory ARREST Do not call a "code blue"   In the event of cardiac or respiratory ARREST Do not perform Intubation, CPR, defibrillation or ACLS   In the event of cardiac or respiratory ARREST Use medication by any route, position, wound care, and other measures to relive pain and suffering. May  use oxygen, suction and manual treatment of airway obstruction as needed for comfort.      03/21/17 0731    Code Status History    Date Active Date Inactive Code Status Order ID Comments User Context   03/13/2017  9:43 AM 03/14/2017 12:09 AM DNR [id number]  Melton Alar, PA-C Inpatient   03/08/2017  2:15 AM 03/13/2017  9:43 AM Partial Code [id number]  Harrie Foreman, MD ED   01/07/2017  7:26 PM 01/09/2017  5:42 PM Partial Code [id number]  Vaughan Basta, MD Inpatient   01/06/2017  3:21 PM 01/07/2017  7:26 PM Full Code [id number]  Epifanio Lesches, MD ED   11/28/2016  7:40 PM 11/29/2016  3:44 PM Full Code [id number]  Vaughan Basta, MD Inpatient    Advance Directive Documentation     Most Recent Value  Type of Advance Directive  Healthcare Power of Attorney  Pre-existing out of facility DNR order (yellow form or pink MOST form)  -  "MOST" Form in Place?  -      TOTAL TIME TAKING CARE OF THIS PATIENT: 38 minutes.    Gladstone Lighter M.D on 04/08/2017 at 8:21 AM  Between 7am to 6pm - Pager - [phone]  After 6pm go to [email or web address] - password [email or web address] [facility]  [employer]  Hospitalists  Office  [phone]  CC: Primary care physician; [name], MD   Note: This dictation was prepared with Dragon dictation along with smaller phrase technology. Any transcriptional errors that result from this process are unintentional.

## 2017-04-08 NOTE — Progress Notes (Signed)
Patient to discharged home with hospice services via EMS. Discharge summary faxed to referral. All equipment delivered last evening. Signed DNR in place in discharge packet. Thank you. Flo Shanks RN, BSN, [facility] [place], hospital Liaison [phone] c

## 2017-04-09 ENCOUNTER — Ambulatory Visit: Payer: Medicare Other

## 2017-04-09 ENCOUNTER — Telehealth: Payer: Self-pay | Admitting: *Deleted

## 2017-04-09 ENCOUNTER — Other Ambulatory Visit: Payer: Self-pay

## 2017-04-09 MED ORDER — OXYCODONE HCL 5 MG PO TABS: 2.5000 mg | ORAL_TABLET | ORAL | 0 refills | Status: AC | PRN

## 2017-04-09 NOTE — Telephone Encounter (Signed)
Pt recently d/c from ED and sent home on hospice. TCM  not completed per [facility]

## 2017-04-09 NOTE — Telephone Encounter (Signed)
Jeannie with Hospice notified that script has been faxed to the pharmacy.

## 2017-04-09 NOTE — Telephone Encounter (Signed)
Donna Lane with [name] left v/m requesting refill oxycodone faxed to [address] with [facility] pt on rx. Last printed # 30 on 03/13/17.

## 2017-04-09 NOTE — Telephone Encounter (Signed)
Noted. Thanks.

## 2017-04-09 NOTE — Telephone Encounter (Signed)
Printed.  Thanks.  Please fax in.   

## 2017-04-09 NOTE — Telephone Encounter (Signed)
Rx faxed to pharmacy as instructed. Left message on voicemail for [name] to call back.

## 2017-04-10 ENCOUNTER — Ambulatory Visit: Payer: Medicare Other

## 2017-04-10 ENCOUNTER — Other Ambulatory Visit: Payer: Self-pay | Admitting: *Deleted

## 2017-04-11 ENCOUNTER — Telehealth: Payer: Self-pay

## 2017-04-11 ENCOUNTER — Ambulatory Visit: Payer: Medicare Other

## 2017-04-11 NOTE — Telephone Encounter (Signed)
Vivien Rota nurse with [facility] left v/m requesting oxycodone rx faxed to [facility] [address]. pts daughter is not happy because she could not get med last night. I spoke with Dominica Severin at [facility] and oxycodone was picked up 04/10/17 at 7:42 pm. Vivien Rota voiced understanding and will speak with family.

## 2017-04-11 NOTE — Telephone Encounter (Signed)
Agreed.  Thanks.  

## 2017-04-11 NOTE — Telephone Encounter (Signed)
Donna Lane with [facility] said this morning FBS read Hi and gave pt novolog 10 units; just rechecked BS and reading Hi; pt is in early active stage of dying. Not coherent, taking no meds,sleeping a lot and declining quickly. Donna Lane wants to know instructions for taking insulin or not. Dr Damita Dunnings said if pt is actively dying stop checking blood sugars; if pt condition were to change or improve to cb and may change instructions. Donna Lane voiced understanding.

## 2017-04-14 ENCOUNTER — Ambulatory Visit: Payer: Medicare Other

## 2017-04-15 ENCOUNTER — Ambulatory Visit: Payer: Medicare Other

## 2017-04-15 DEATH — deceased

## 2017-04-16 ENCOUNTER — Ambulatory Visit: Payer: Medicare Other

## 2017-04-17 ENCOUNTER — Ambulatory Visit: Payer: Medicare Other

## 2017-04-17 ENCOUNTER — Telehealth: Payer: Self-pay | Admitting: Family Medicine

## 2017-04-17 NOTE — Telephone Encounter (Signed)
Called home number.  No answer.  LMOVM w/o confidential info.  I'll try to contact later.

## 2017-04-18 ENCOUNTER — Ambulatory Visit: Payer: Medicare Other

## 2017-04-20 NOTE — Telephone Encounter (Signed)
Late entry. I previously called her husband and offered condolences. He thanked me for the call. I was glad to see this patient in the clinic.

## 2017-04-21 ENCOUNTER — Ambulatory Visit: Payer: Medicare Other

## 2017-05-20 ENCOUNTER — Ambulatory Visit: Payer: Medicare Other | Admitting: Internal Medicine

## 2018-05-19 IMAGING — CR DG CHEST 2V
1 series · 2 of 2 positions shown · non-contrast
Comparison: 01/06/2017

CLINICAL DATA: Shortness of breath with leg swelling

EXAM:
CHEST  2 VIEW

[Series 1: dg chest 2 view · 0.14mm/px · 2 of 2 slices shown]
[im 1/2]
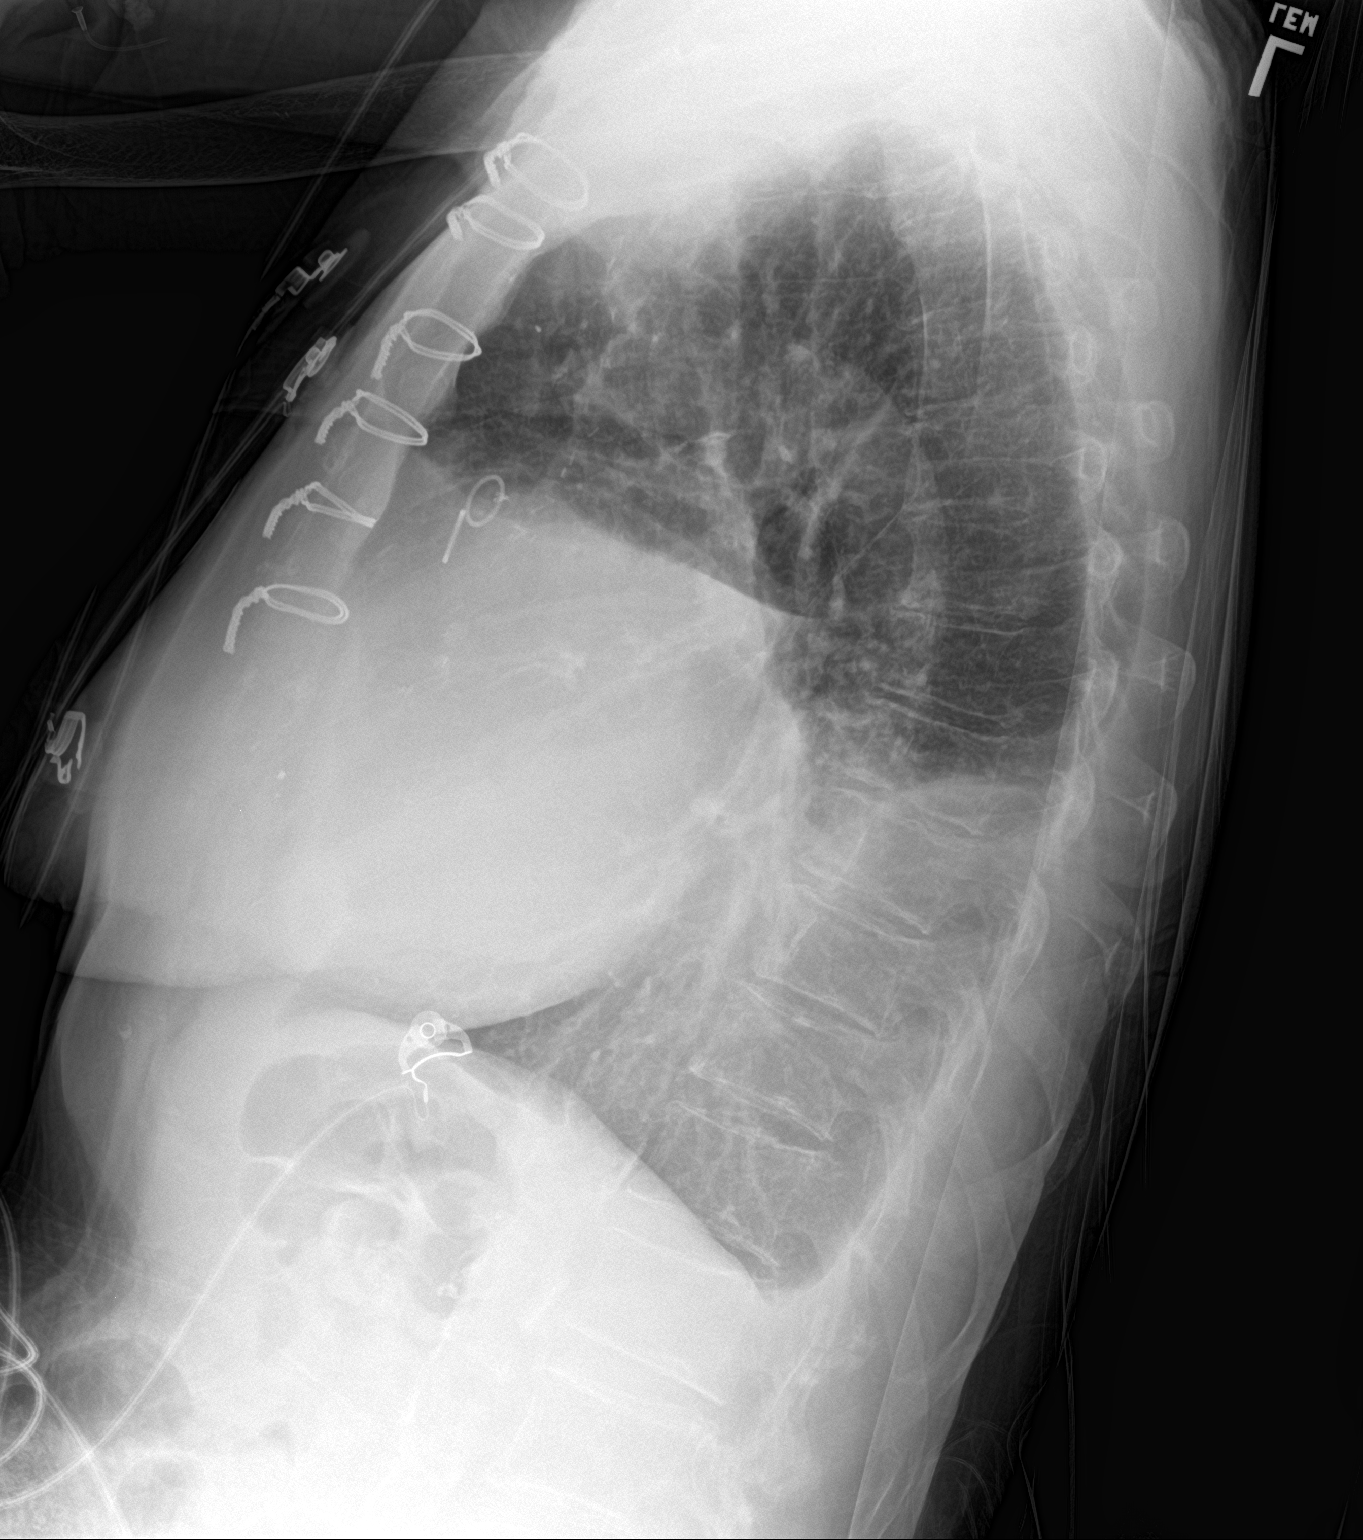
[im 2/2]
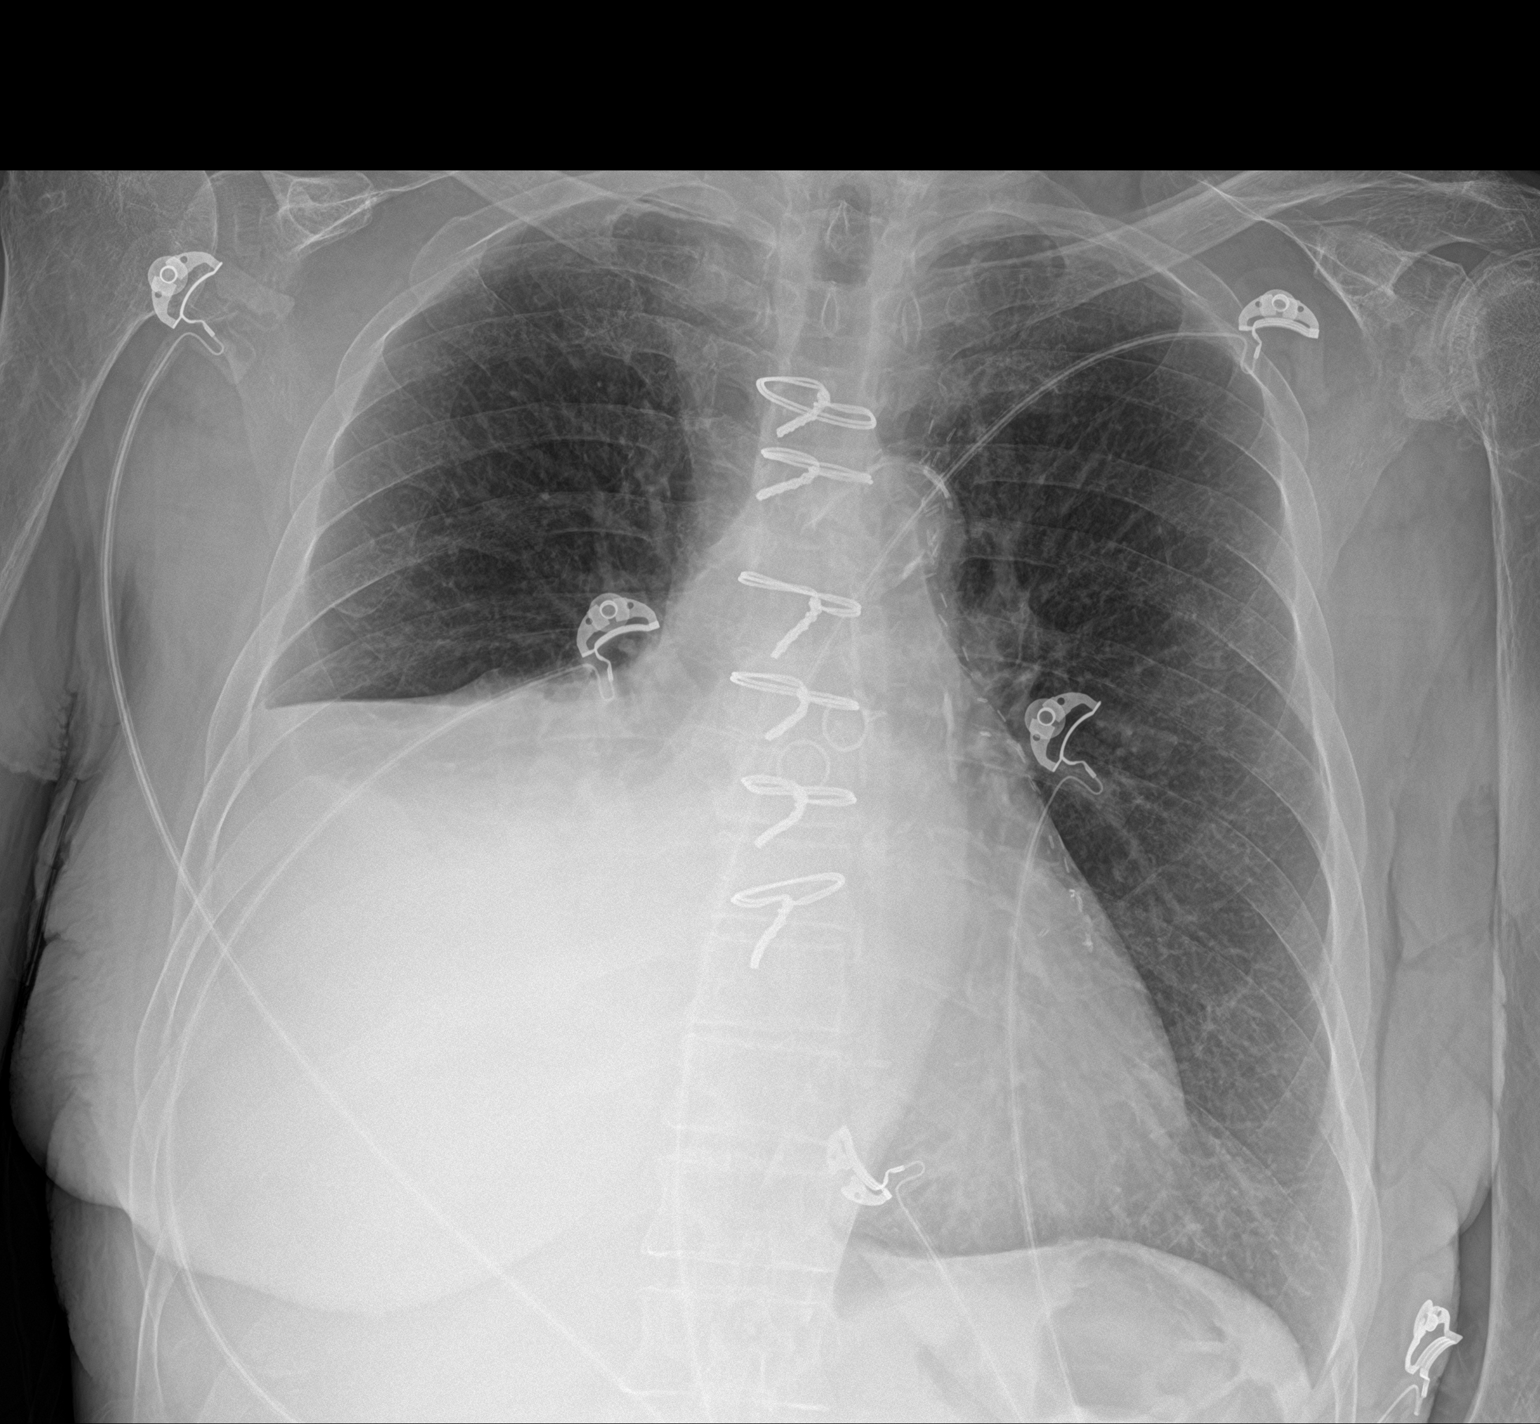

[2 of 2 positions shown; findings below may reference images not displayed]

FINDINGS: Post sternotomy changes. Small left-sided pleural effusion. Suspect
moderate right-sided pleural effusion. There is consolidation in the
right middle lobe and right lower lobe. Stable cardiomegaly. No
pneumothorax.
IMPRESSION: 1. Small left pleural effusion. Suspect a moderate right pleural
effusion.
2. Dense right middle lobe and lung base consolidation
3. Cardiomegaly

## 2018-06-04 IMAGING — DX DG CHEST 1V PORT
2 series · 2 of 2 positions shown · non-contrast
Comparison: 03/21/2017 chest radiograph.

CLINICAL DATA: 89 y/o  F; cough.  History of lung cancer.

EXAM:
PORTABLE CHEST 1 VIEW

[chest ap (1 of 2)]
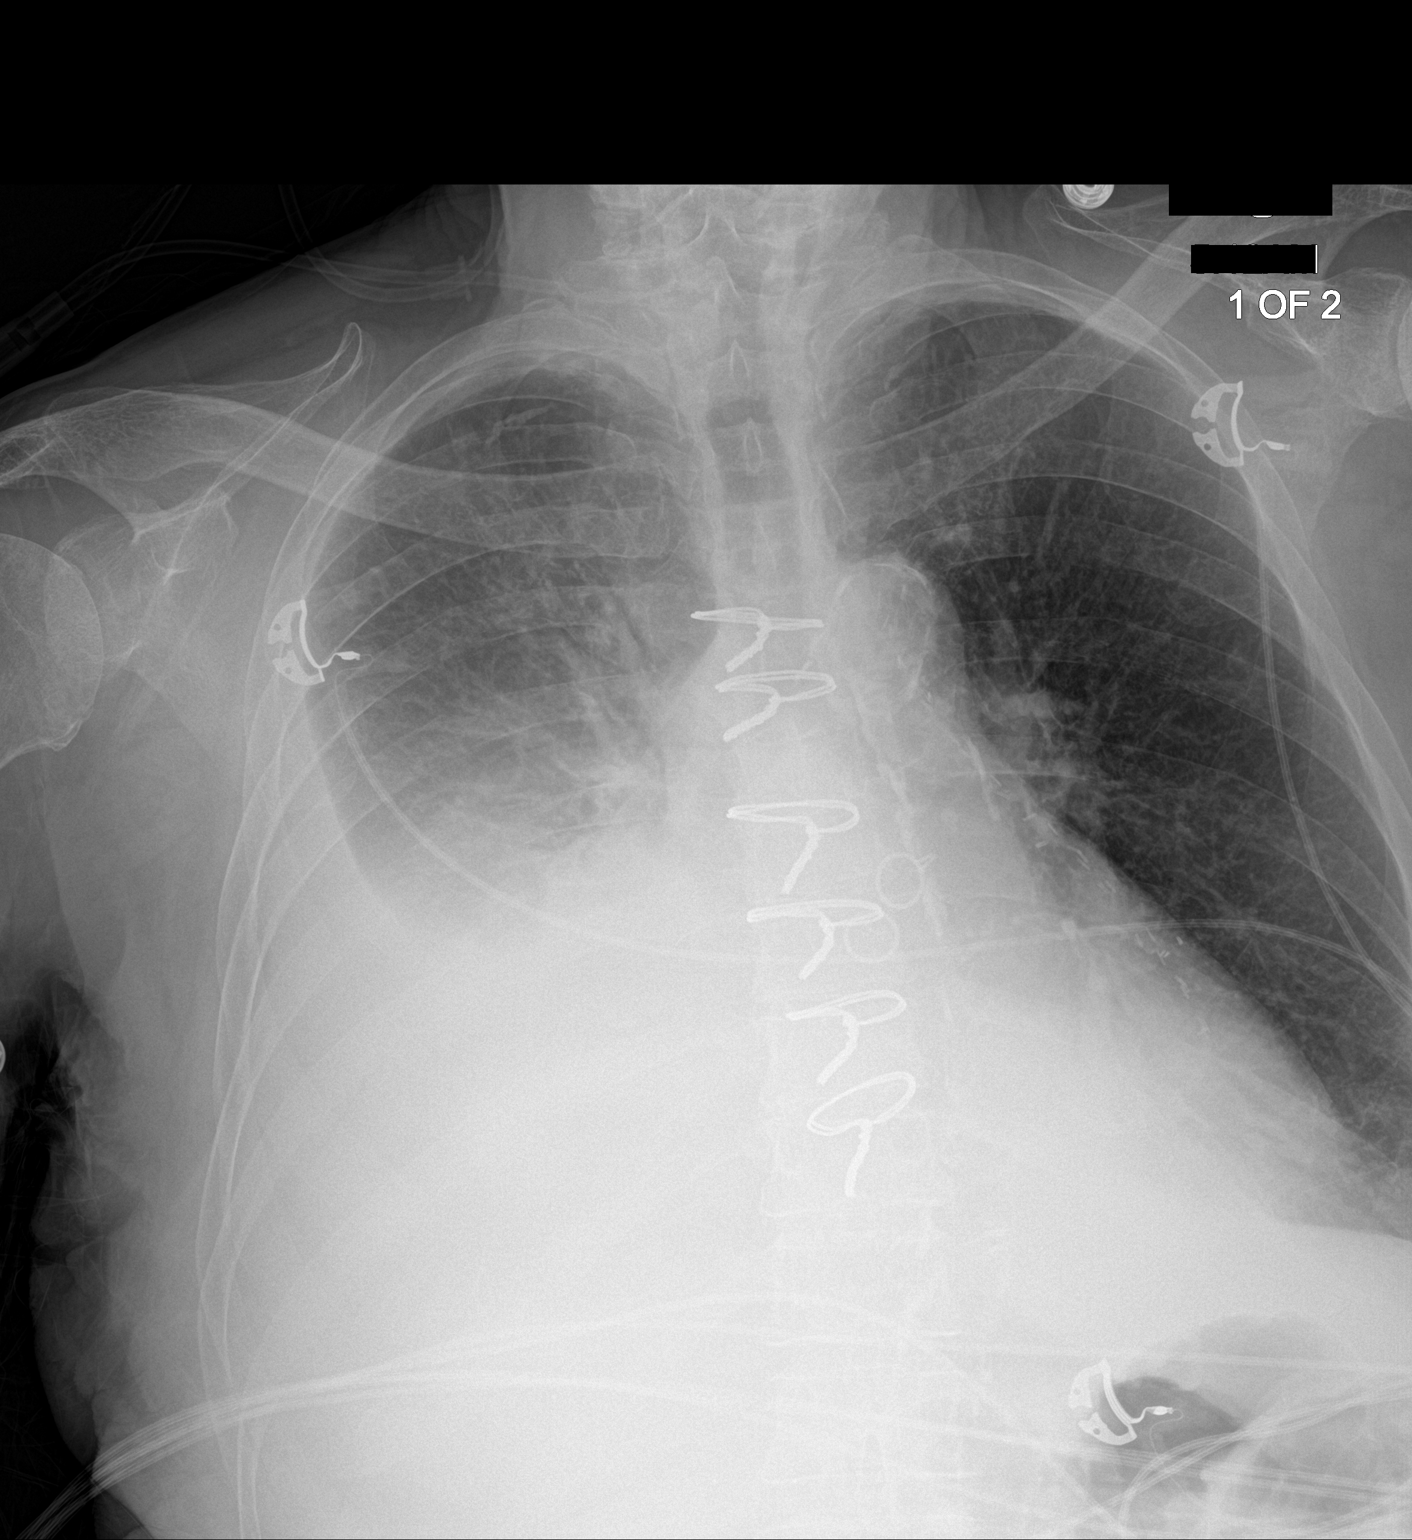

[chest ap (2 of 2)]
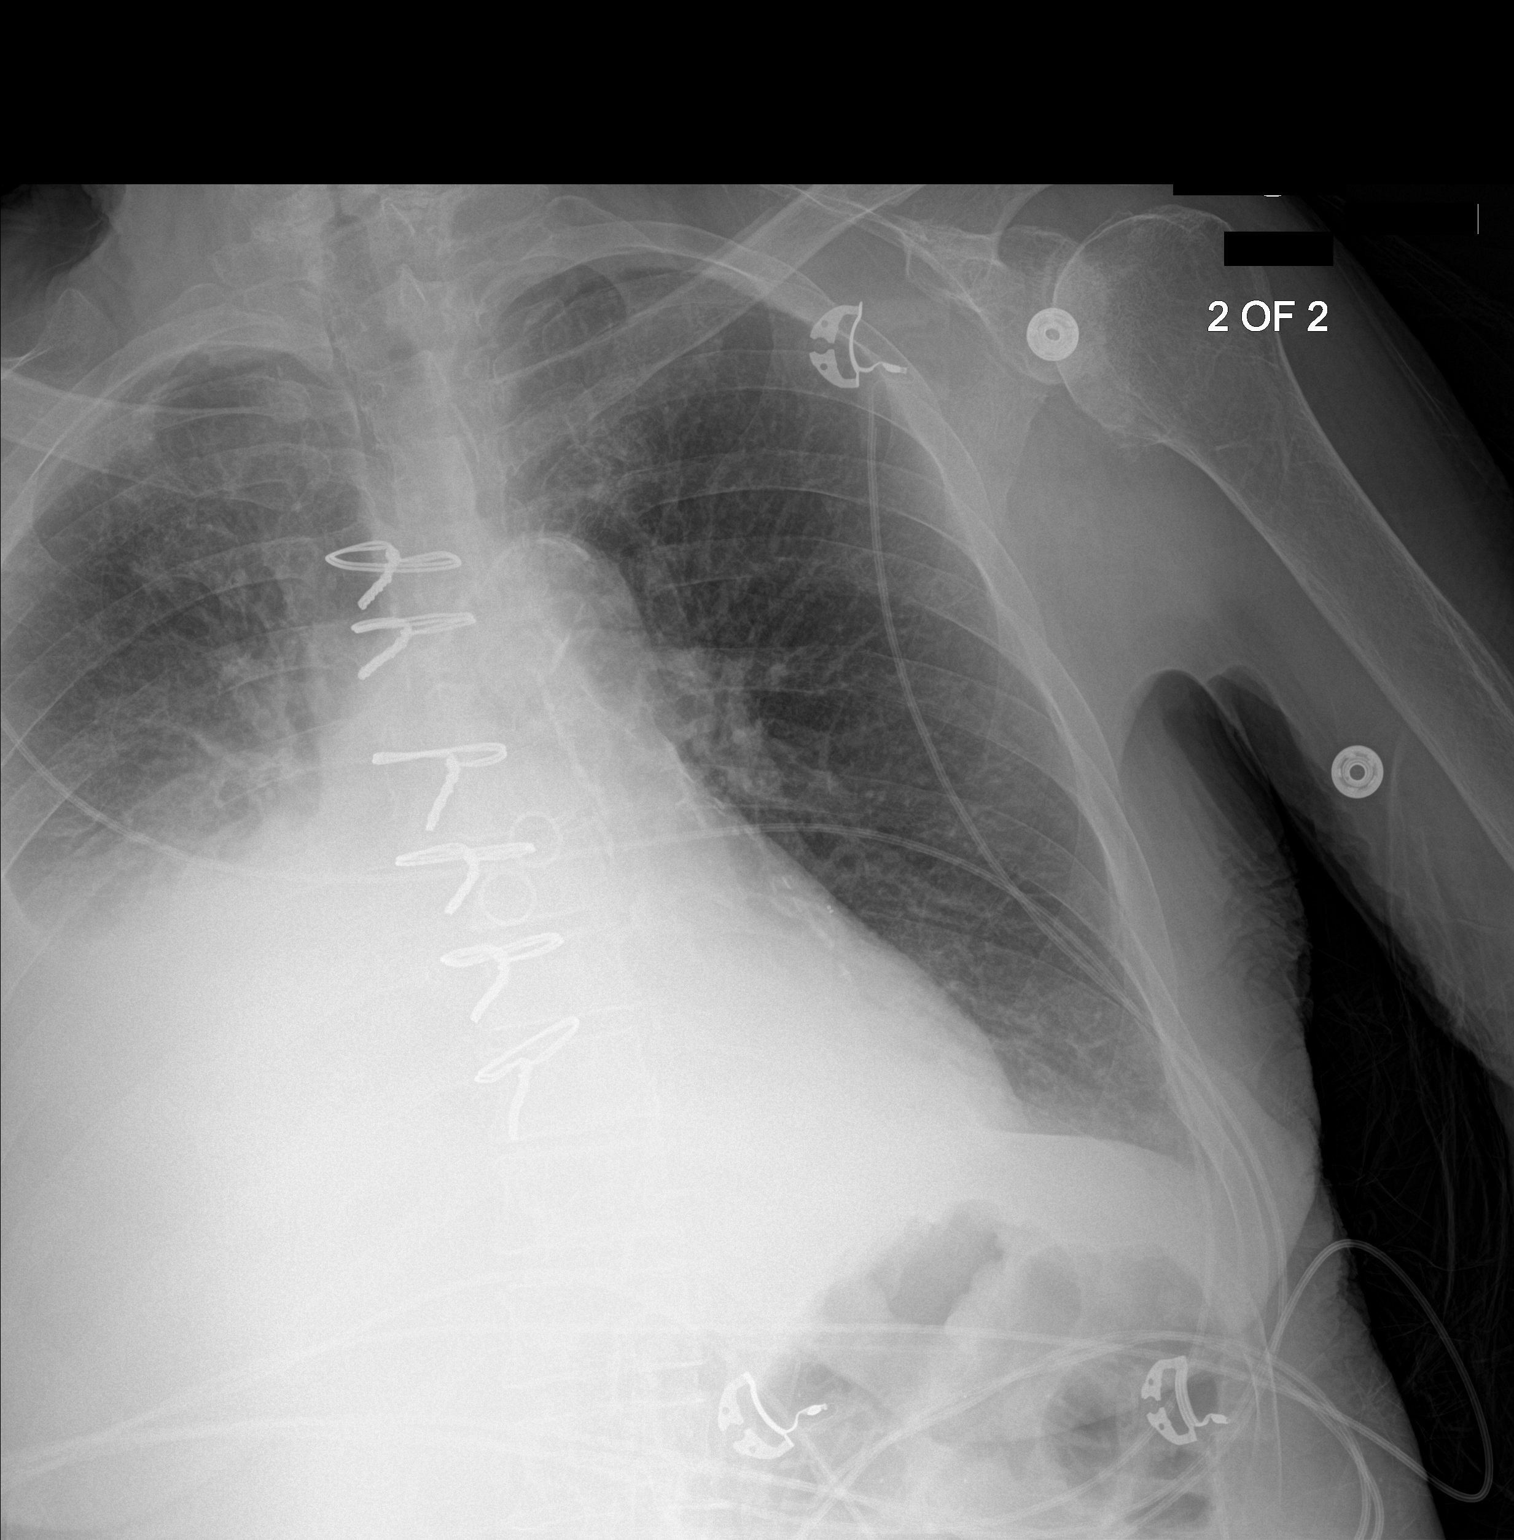

[2 of 2 positions shown; findings below may reference images not displayed]

FINDINGS: Stable cardiomegaly partially obscured by large right effusion.
Aortic atherosclerosis with calcification. Post CABG with sternotomy
wires and alignment. Large right and small left pleural effusions,
probably increased on the right from prior chest radiograph. Right
mid and lower lung zone opacification.
IMPRESSION: Large right and small left pleural effusions probably increased on
the right in comparison with prior chest radiograph. Opacification
of right mid and lower lung zones and left lower lobe may represent
atelectasis or pneumonia. Right lung mass is obscured by the
effusion.

By: Adem Adouma Nigri M.D.

## 2018-06-06 IMAGING — CR DG CHEST 1V PORT
1 series · 2 of 2 positions shown · non-contrast
Comparison: 03/23/2017

CLINICAL DATA: Post thoracentesis

EXAM:
PORTABLE CHEST 1 VIEW

[Series 3: x chest ap · 0.14mm/px · 2 of 2 slices shown]
[im 1/2]
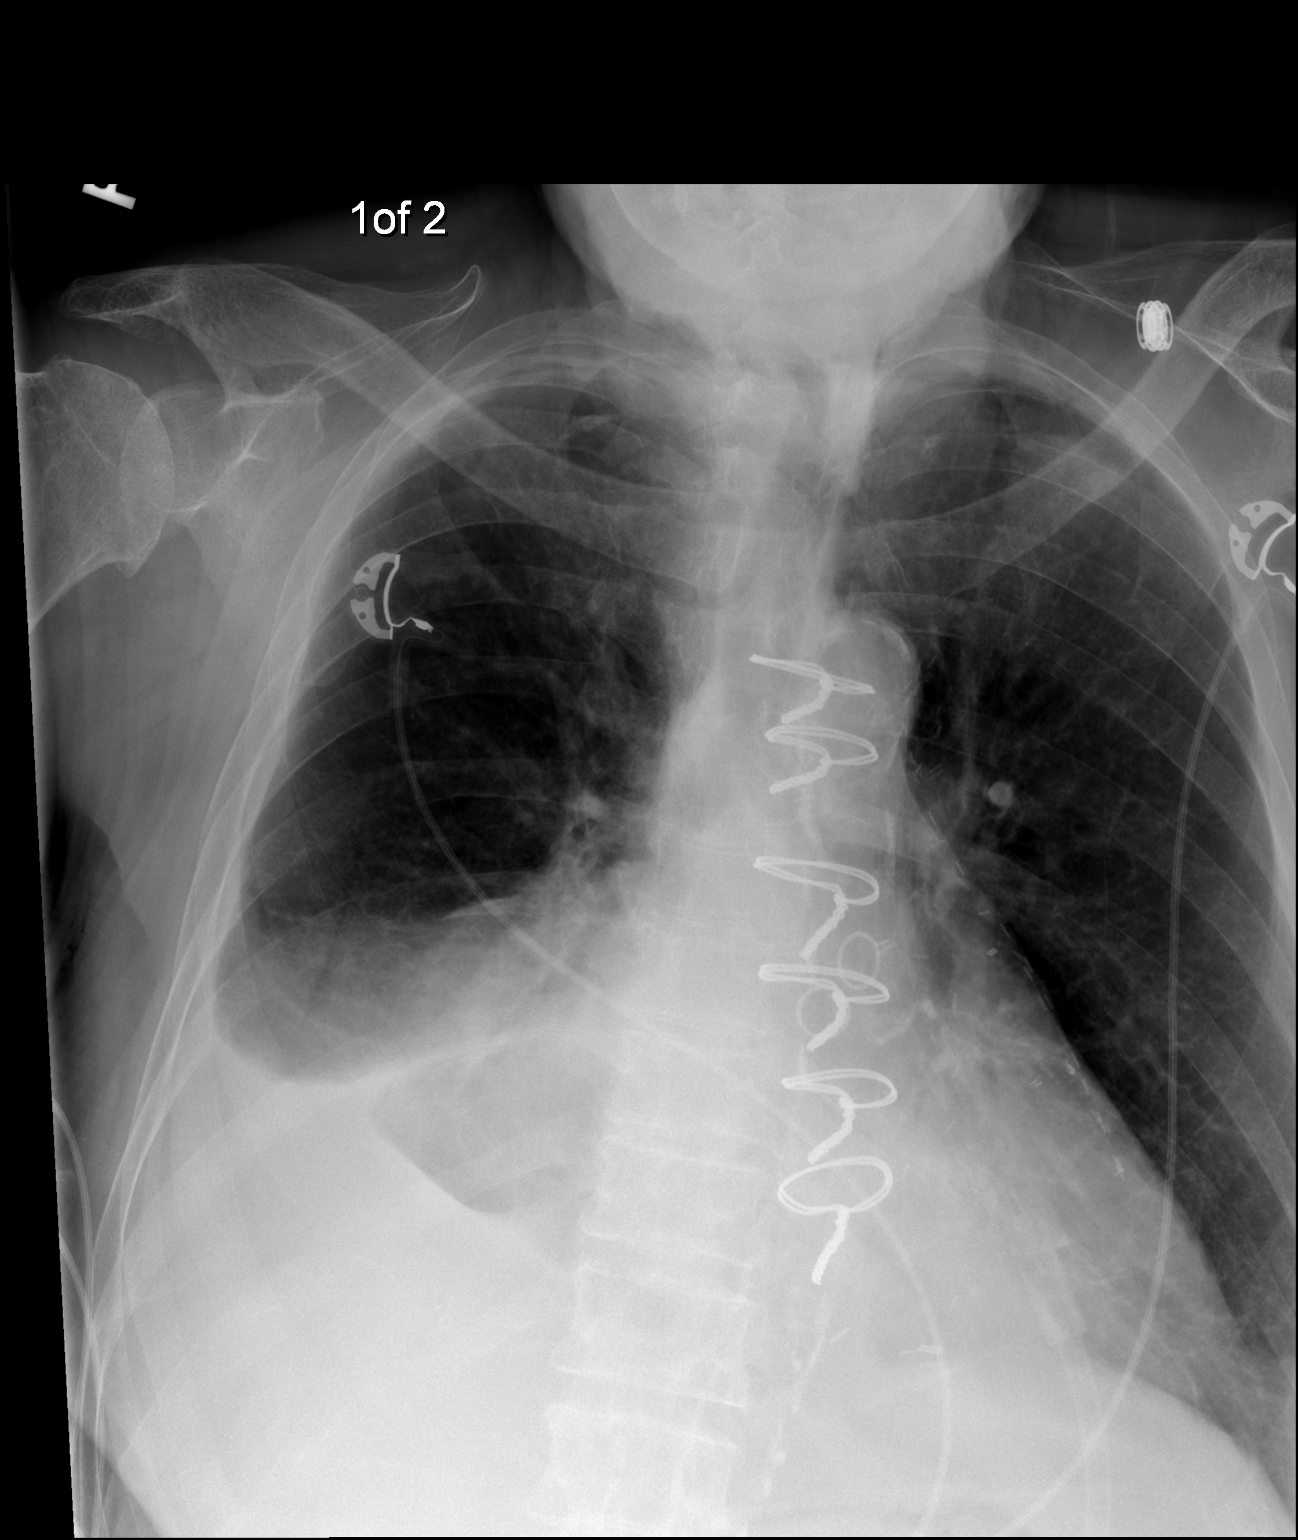
[im 2/2]
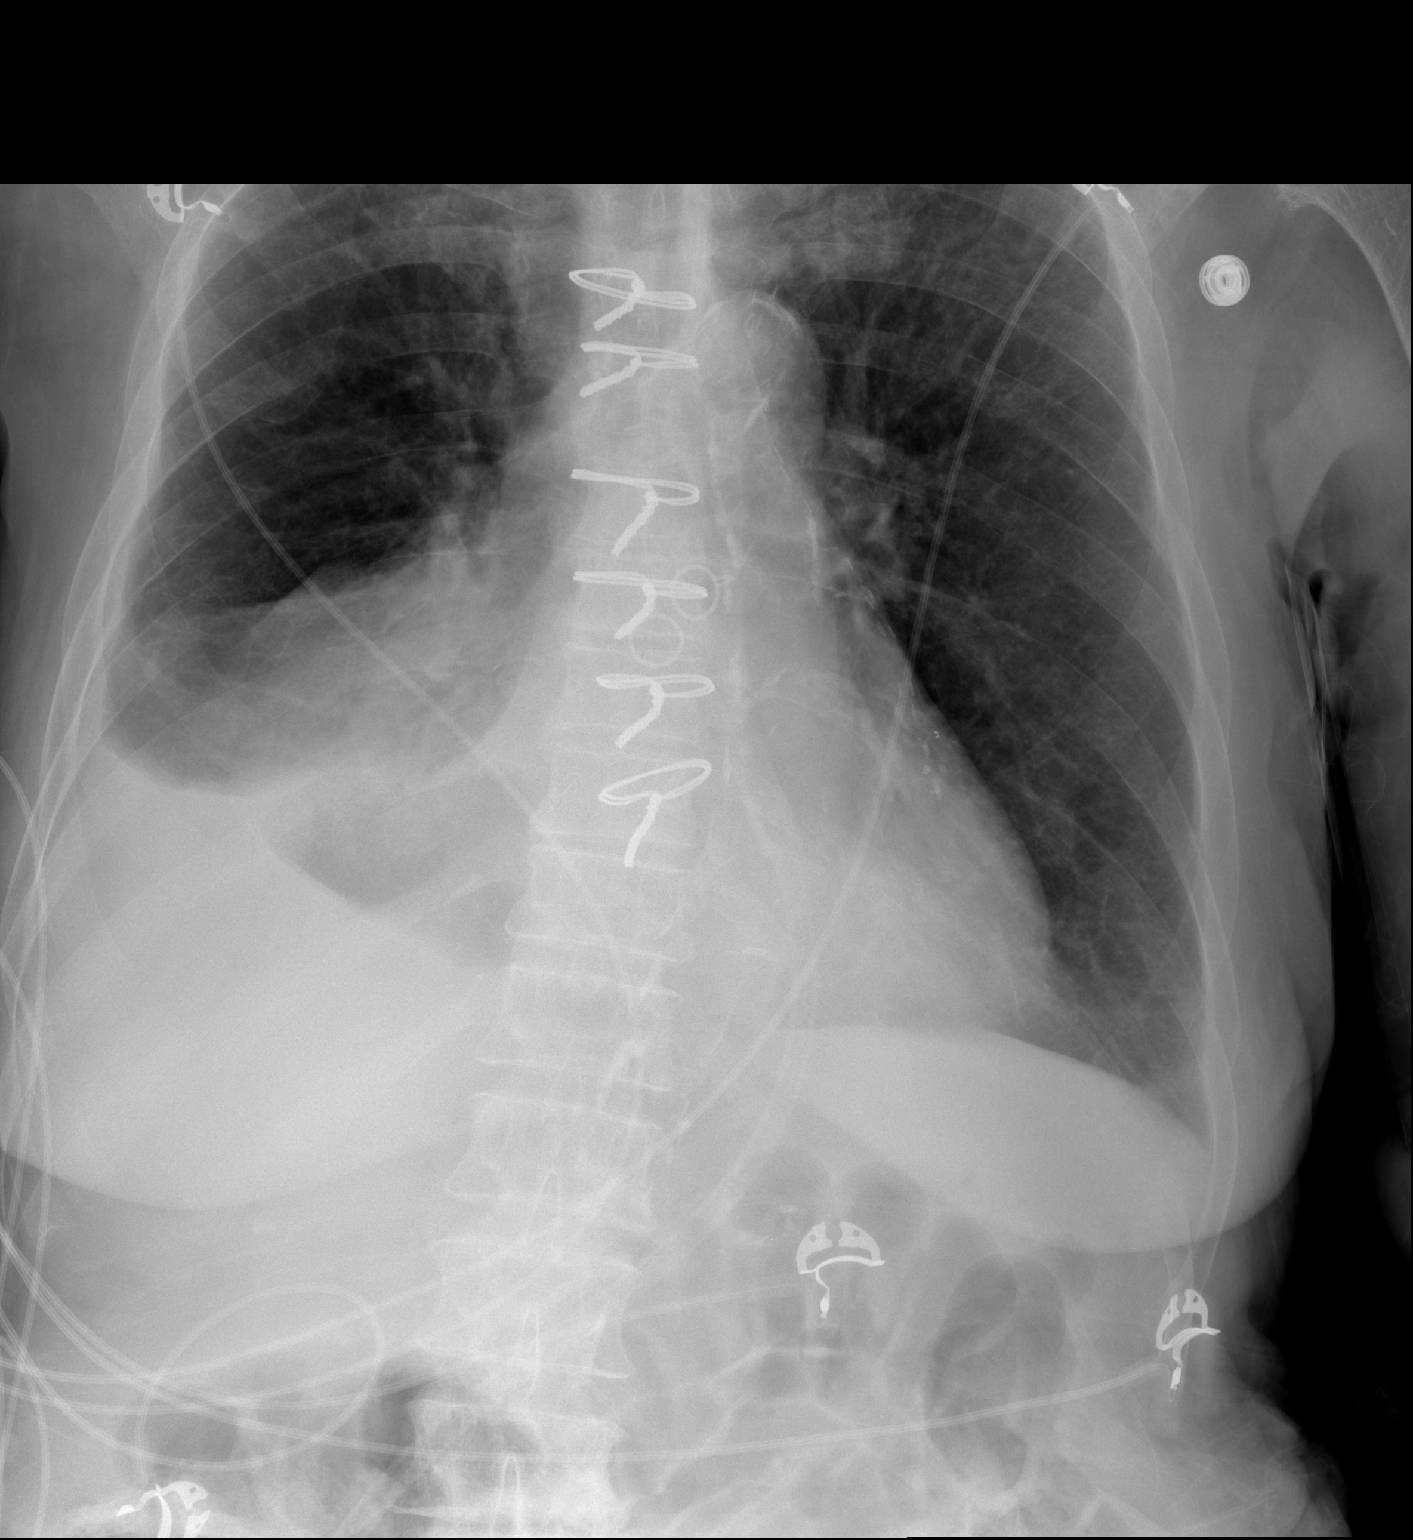

[2 of 2 positions shown; findings below may reference images not displayed]

FINDINGS: Decreasing right effusion following thoracentesis. Moderate right
effusion remains. No pneumothorax. Right base atelectasis. Heart is
borderline in size. Prior CABG. No focal opacity on the left.
IMPRESSION: Moderate right effusion, decreasing following right thoracentesis.
No pneumothorax.
